# Patient Record
Sex: Female | Born: 1955 | Race: White | Hispanic: No | Marital: Married | Smoking: Never smoker
Health system: Southern US, Community
[De-identification: ages and names within clinical notes are randomized; demographics above are authoritative.]

## PROBLEM LIST (undated history)

## (undated) DIAGNOSIS — G4733 Obstructive sleep apnea (adult) (pediatric): Secondary | ICD-10-CM

## (undated) DIAGNOSIS — F411 Generalized anxiety disorder: Secondary | ICD-10-CM

## (undated) DIAGNOSIS — F319 Bipolar disorder, unspecified: Secondary | ICD-10-CM

## (undated) DIAGNOSIS — M1990 Unspecified osteoarthritis, unspecified site: Secondary | ICD-10-CM

## (undated) DIAGNOSIS — R030 Elevated blood-pressure reading, without diagnosis of hypertension: Secondary | ICD-10-CM

## (undated) DIAGNOSIS — T4145XA Adverse effect of unspecified anesthetic, initial encounter: Secondary | ICD-10-CM

## (undated) DIAGNOSIS — E041 Nontoxic single thyroid nodule: Secondary | ICD-10-CM

## (undated) DIAGNOSIS — G500 Trigeminal neuralgia: Secondary | ICD-10-CM

## (undated) DIAGNOSIS — K219 Gastro-esophageal reflux disease without esophagitis: Secondary | ICD-10-CM

## (undated) DIAGNOSIS — R220 Localized swelling, mass and lump, head: Secondary | ICD-10-CM

## (undated) DIAGNOSIS — D509 Iron deficiency anemia, unspecified: Secondary | ICD-10-CM

## (undated) DIAGNOSIS — N12 Tubulo-interstitial nephritis, not specified as acute or chronic: Secondary | ICD-10-CM

## (undated) DIAGNOSIS — E785 Hyperlipidemia, unspecified: Secondary | ICD-10-CM

## (undated) DIAGNOSIS — G43909 Migraine, unspecified, not intractable, without status migrainosus: Secondary | ICD-10-CM

## (undated) DIAGNOSIS — Z8659 Personal history of other mental and behavioral disorders: Secondary | ICD-10-CM

## (undated) DIAGNOSIS — E039 Hypothyroidism, unspecified: Secondary | ICD-10-CM

## (undated) DIAGNOSIS — F329 Major depressive disorder, single episode, unspecified: Secondary | ICD-10-CM

## (undated) DIAGNOSIS — H6520 Chronic serous otitis media, unspecified ear: Secondary | ICD-10-CM

## (undated) DIAGNOSIS — H9190 Unspecified hearing loss, unspecified ear: Secondary | ICD-10-CM

## (undated) DIAGNOSIS — G4737 Central sleep apnea in conditions classified elsewhere: Secondary | ICD-10-CM

## (undated) DIAGNOSIS — R1310 Dysphagia, unspecified: Secondary | ICD-10-CM

## (undated) DIAGNOSIS — J019 Acute sinusitis, unspecified: Secondary | ICD-10-CM

## (undated) DIAGNOSIS — I10 Essential (primary) hypertension: Secondary | ICD-10-CM

## (undated) DIAGNOSIS — H6690 Otitis media, unspecified, unspecified ear: Secondary | ICD-10-CM

## (undated) DIAGNOSIS — I872 Venous insufficiency (chronic) (peripheral): Secondary | ICD-10-CM

## (undated) DIAGNOSIS — I739 Peripheral vascular disease, unspecified: Secondary | ICD-10-CM

## (undated) DIAGNOSIS — J309 Allergic rhinitis, unspecified: Secondary | ICD-10-CM

## (undated) DIAGNOSIS — R221 Localized swelling, mass and lump, neck: Secondary | ICD-10-CM

## (undated) DIAGNOSIS — J209 Acute bronchitis, unspecified: Secondary | ICD-10-CM

## (undated) DIAGNOSIS — C439 Malignant melanoma of skin, unspecified: Secondary | ICD-10-CM

## (undated) HISTORY — DX: Venous insufficiency (chronic) (peripheral): I87.2

## (undated) HISTORY — DX: Localized swelling, mass and lump, head: R22.0

## (undated) HISTORY — DX: Generalized anxiety disorder: F41.1

## (undated) HISTORY — DX: Trigeminal neuralgia: G50.0

## (undated) HISTORY — DX: Gastro-esophageal reflux disease without esophagitis: K21.9

## (undated) HISTORY — DX: Acute bronchitis, unspecified: J20.9

## (undated) HISTORY — DX: Essential (primary) hypertension: I10

## (undated) HISTORY — PX: BRAIN SURGERY: SHX531

## (undated) HISTORY — PX: COLONOSCOPY: SHX174

## (undated) HISTORY — DX: Peripheral vascular disease, unspecified: I73.9

## (undated) HISTORY — DX: Allergic rhinitis, unspecified: J30.9

## (undated) HISTORY — DX: Dysphagia, unspecified: R13.10

## (undated) HISTORY — DX: Iron deficiency anemia, unspecified: D50.9

## (undated) HISTORY — DX: Nontoxic single thyroid nodule: E04.1

## (undated) HISTORY — DX: Localized swelling, mass and lump, neck: R22.1

## (undated) HISTORY — DX: Otitis media, unspecified, unspecified ear: H66.90

## (undated) HISTORY — DX: Unspecified hearing loss, unspecified ear: H91.90

## (undated) HISTORY — DX: Chronic serous otitis media, unspecified ear: H65.20

## (undated) HISTORY — DX: Hyperlipidemia, unspecified: E78.5

## (undated) HISTORY — DX: Elevated blood-pressure reading, without diagnosis of hypertension: R03.0

## (undated) HISTORY — DX: Central sleep apnea in conditions classified elsewhere: G47.37

## (undated) HISTORY — DX: Acute sinusitis, unspecified: J01.90

## (undated) HISTORY — DX: Major depressive disorder, single episode, unspecified: F32.9

---

## 1985-08-27 DIAGNOSIS — T8859XA Other complications of anesthesia, initial encounter: Secondary | ICD-10-CM

## 1985-08-27 HISTORY — PX: INGUINAL HERNIA REPAIR: SUR1180

## 1985-08-27 HISTORY — DX: Other complications of anesthesia, initial encounter: T88.59XA

## 1992-08-27 HISTORY — PX: BREAST BIOPSY: SHX20

## 1997-08-27 HISTORY — PX: CEREBRAL MICROVASCULAR DECOMPRESSION: SHX1328

## 1998-02-02 ENCOUNTER — Ambulatory Visit (HOSPITAL_COMMUNITY): Admission: RE | Admit: 1998-02-02 | Discharge: 1998-02-02 | Payer: Self-pay | Admitting: Internal Medicine

## 1998-08-27 HISTORY — PX: APPENDECTOMY: SHX54

## 1998-08-27 HISTORY — PX: OOPHORECTOMY: SHX86

## 1998-08-27 HISTORY — PX: ABDOMINAL HYSTERECTOMY: SHX81

## 1998-11-30 ENCOUNTER — Emergency Department (HOSPITAL_COMMUNITY): Admission: EM | Admit: 1998-11-30 | Discharge: 1998-11-30 | Payer: Self-pay | Admitting: Emergency Medicine

## 1999-09-26 ENCOUNTER — Other Ambulatory Visit: Admission: RE | Admit: 1999-09-26 | Discharge: 1999-09-26 | Payer: Self-pay | Admitting: *Deleted

## 2001-08-18 ENCOUNTER — Encounter: Admission: RE | Admit: 2001-08-18 | Discharge: 2001-11-16 | Payer: Self-pay

## 2003-03-29 ENCOUNTER — Other Ambulatory Visit: Admission: RE | Admit: 2003-03-29 | Discharge: 2003-03-29 | Payer: Self-pay | Admitting: Obstetrics and Gynecology

## 2003-04-27 ENCOUNTER — Encounter: Payer: Self-pay | Admitting: Obstetrics and Gynecology

## 2003-04-27 ENCOUNTER — Ambulatory Visit (HOSPITAL_COMMUNITY): Admission: RE | Admit: 2003-04-27 | Discharge: 2003-04-27 | Payer: Self-pay | Admitting: Obstetrics and Gynecology

## 2004-07-04 ENCOUNTER — Ambulatory Visit: Payer: Self-pay | Admitting: *Deleted

## 2004-07-12 ENCOUNTER — Ambulatory Visit: Payer: Self-pay | Admitting: *Deleted

## 2004-07-18 ENCOUNTER — Ambulatory Visit: Payer: Self-pay | Admitting: *Deleted

## 2004-07-24 ENCOUNTER — Ambulatory Visit: Payer: Self-pay | Admitting: *Deleted

## 2004-08-08 ENCOUNTER — Encounter (INDEPENDENT_AMBULATORY_CARE_PROVIDER_SITE_OTHER): Payer: Self-pay | Admitting: *Deleted

## 2004-08-08 ENCOUNTER — Ambulatory Visit (HOSPITAL_BASED_OUTPATIENT_CLINIC_OR_DEPARTMENT_OTHER): Admission: RE | Admit: 2004-08-08 | Discharge: 2004-08-08 | Payer: Self-pay | Admitting: Urology

## 2004-08-08 ENCOUNTER — Ambulatory Visit (HOSPITAL_COMMUNITY): Admission: RE | Admit: 2004-08-08 | Discharge: 2004-08-08 | Payer: Self-pay | Admitting: Urology

## 2004-08-14 ENCOUNTER — Ambulatory Visit: Payer: Self-pay | Admitting: *Deleted

## 2004-09-15 ENCOUNTER — Ambulatory Visit: Payer: Self-pay | Admitting: *Deleted

## 2004-09-20 ENCOUNTER — Ambulatory Visit: Payer: Self-pay | Admitting: *Deleted

## 2004-09-25 ENCOUNTER — Ambulatory Visit: Payer: Self-pay | Admitting: *Deleted

## 2004-10-02 ENCOUNTER — Ambulatory Visit: Payer: Self-pay | Admitting: *Deleted

## 2004-10-09 ENCOUNTER — Ambulatory Visit: Payer: Self-pay | Admitting: *Deleted

## 2004-10-23 ENCOUNTER — Ambulatory Visit: Payer: Self-pay | Admitting: *Deleted

## 2004-10-30 ENCOUNTER — Ambulatory Visit: Payer: Self-pay | Admitting: *Deleted

## 2004-11-06 ENCOUNTER — Ambulatory Visit: Payer: Self-pay | Admitting: *Deleted

## 2004-11-13 ENCOUNTER — Ambulatory Visit: Payer: Self-pay | Admitting: *Deleted

## 2004-11-20 ENCOUNTER — Ambulatory Visit: Payer: Self-pay | Admitting: *Deleted

## 2004-11-20 ENCOUNTER — Emergency Department (HOSPITAL_COMMUNITY): Admission: EM | Admit: 2004-11-20 | Discharge: 2004-11-21 | Payer: Self-pay | Admitting: Emergency Medicine

## 2004-11-20 ENCOUNTER — Ambulatory Visit: Payer: Self-pay | Admitting: Psychiatry

## 2004-11-21 ENCOUNTER — Inpatient Hospital Stay (HOSPITAL_COMMUNITY): Admission: EM | Admit: 2004-11-21 | Discharge: 2004-11-28 | Payer: Self-pay | Admitting: Psychiatry

## 2004-11-27 ENCOUNTER — Ambulatory Visit (HOSPITAL_COMMUNITY): Admission: RE | Admit: 2004-11-27 | Discharge: 2004-11-27 | Payer: Self-pay | Admitting: Psychiatry

## 2004-12-04 ENCOUNTER — Other Ambulatory Visit (HOSPITAL_COMMUNITY): Admission: RE | Admit: 2004-12-04 | Discharge: 2004-12-21 | Payer: Self-pay | Admitting: Psychiatry

## 2004-12-11 ENCOUNTER — Ambulatory Visit: Payer: Self-pay | Admitting: *Deleted

## 2005-01-01 ENCOUNTER — Ambulatory Visit: Payer: Self-pay | Admitting: *Deleted

## 2005-01-19 ENCOUNTER — Ambulatory Visit: Payer: Self-pay | Admitting: *Deleted

## 2005-03-02 ENCOUNTER — Ambulatory Visit: Payer: Self-pay | Admitting: *Deleted

## 2005-03-09 ENCOUNTER — Ambulatory Visit: Payer: Self-pay | Admitting: *Deleted

## 2005-03-12 ENCOUNTER — Ambulatory Visit: Payer: Self-pay | Admitting: *Deleted

## 2005-03-20 ENCOUNTER — Ambulatory Visit: Payer: Self-pay | Admitting: *Deleted

## 2005-04-02 ENCOUNTER — Ambulatory Visit: Payer: Self-pay | Admitting: *Deleted

## 2005-04-13 ENCOUNTER — Ambulatory Visit: Payer: Self-pay | Admitting: *Deleted

## 2005-04-23 ENCOUNTER — Ambulatory Visit: Payer: Self-pay | Admitting: *Deleted

## 2005-05-18 ENCOUNTER — Ambulatory Visit: Payer: Self-pay | Admitting: Internal Medicine

## 2005-05-30 ENCOUNTER — Ambulatory Visit (HOSPITAL_COMMUNITY): Admission: RE | Admit: 2005-05-30 | Discharge: 2005-05-30 | Payer: Self-pay | Admitting: Internal Medicine

## 2005-05-30 ENCOUNTER — Ambulatory Visit: Payer: Self-pay | Admitting: Internal Medicine

## 2005-06-01 ENCOUNTER — Ambulatory Visit (HOSPITAL_COMMUNITY): Admission: RE | Admit: 2005-06-01 | Discharge: 2005-06-01 | Payer: Self-pay | Admitting: Internal Medicine

## 2005-06-13 ENCOUNTER — Ambulatory Visit: Payer: Self-pay | Admitting: Endocrinology

## 2005-06-13 ENCOUNTER — Ambulatory Visit: Payer: Self-pay | Admitting: Internal Medicine

## 2005-06-14 ENCOUNTER — Ambulatory Visit: Payer: Self-pay | Admitting: Gastroenterology

## 2005-06-14 ENCOUNTER — Ambulatory Visit (HOSPITAL_COMMUNITY): Admission: RE | Admit: 2005-06-14 | Discharge: 2005-06-14 | Payer: Self-pay | Admitting: Gastroenterology

## 2005-06-14 ENCOUNTER — Encounter (INDEPENDENT_AMBULATORY_CARE_PROVIDER_SITE_OTHER): Payer: Self-pay | Admitting: *Deleted

## 2005-06-29 ENCOUNTER — Ambulatory Visit: Payer: Self-pay | Admitting: Gastroenterology

## 2005-07-12 ENCOUNTER — Encounter (INDEPENDENT_AMBULATORY_CARE_PROVIDER_SITE_OTHER): Payer: Self-pay | Admitting: *Deleted

## 2005-07-12 ENCOUNTER — Encounter: Admission: RE | Admit: 2005-07-12 | Discharge: 2005-07-12 | Payer: Self-pay | Admitting: Endocrinology

## 2005-07-12 ENCOUNTER — Other Ambulatory Visit: Admission: RE | Admit: 2005-07-12 | Discharge: 2005-07-12 | Payer: Self-pay | Admitting: Interventional Radiology

## 2005-09-07 ENCOUNTER — Ambulatory Visit: Payer: Self-pay | Admitting: *Deleted

## 2005-09-19 ENCOUNTER — Ambulatory Visit: Payer: Self-pay | Admitting: *Deleted

## 2005-10-12 ENCOUNTER — Ambulatory Visit: Payer: Self-pay | Admitting: *Deleted

## 2005-10-19 ENCOUNTER — Ambulatory Visit: Payer: Self-pay | Admitting: *Deleted

## 2005-10-26 ENCOUNTER — Ambulatory Visit: Payer: Self-pay | Admitting: *Deleted

## 2005-11-02 ENCOUNTER — Ambulatory Visit: Payer: Self-pay | Admitting: *Deleted

## 2005-11-09 ENCOUNTER — Ambulatory Visit: Payer: Self-pay | Admitting: *Deleted

## 2005-11-26 ENCOUNTER — Ambulatory Visit: Payer: Self-pay | Admitting: *Deleted

## 2005-11-29 ENCOUNTER — Ambulatory Visit: Payer: Self-pay | Admitting: Internal Medicine

## 2005-11-29 ENCOUNTER — Ambulatory Visit: Payer: Self-pay | Admitting: *Deleted

## 2005-12-07 ENCOUNTER — Ambulatory Visit: Payer: Self-pay | Admitting: *Deleted

## 2005-12-07 ENCOUNTER — Ambulatory Visit: Payer: Self-pay | Admitting: Internal Medicine

## 2005-12-14 ENCOUNTER — Ambulatory Visit: Payer: Self-pay | Admitting: *Deleted

## 2005-12-18 ENCOUNTER — Ambulatory Visit: Payer: Self-pay | Admitting: *Deleted

## 2005-12-20 ENCOUNTER — Ambulatory Visit: Payer: Self-pay | Admitting: Internal Medicine

## 2005-12-21 ENCOUNTER — Ambulatory Visit: Payer: Self-pay | Admitting: *Deleted

## 2005-12-28 ENCOUNTER — Ambulatory Visit: Payer: Self-pay | Admitting: *Deleted

## 2005-12-28 ENCOUNTER — Ambulatory Visit: Payer: Self-pay | Admitting: Internal Medicine

## 2006-01-04 ENCOUNTER — Ambulatory Visit: Payer: Self-pay | Admitting: *Deleted

## 2006-01-14 ENCOUNTER — Ambulatory Visit: Payer: Self-pay | Admitting: *Deleted

## 2006-02-01 ENCOUNTER — Ambulatory Visit: Payer: Self-pay | Admitting: *Deleted

## 2006-02-14 ENCOUNTER — Ambulatory Visit: Payer: Self-pay | Admitting: Internal Medicine

## 2006-02-15 ENCOUNTER — Ambulatory Visit: Payer: Self-pay | Admitting: *Deleted

## 2006-07-05 ENCOUNTER — Ambulatory Visit: Payer: Self-pay | Admitting: *Deleted

## 2006-09-27 ENCOUNTER — Ambulatory Visit: Payer: Self-pay | Admitting: Internal Medicine

## 2006-12-31 ENCOUNTER — Ambulatory Visit (HOSPITAL_BASED_OUTPATIENT_CLINIC_OR_DEPARTMENT_OTHER): Admission: RE | Admit: 2006-12-31 | Discharge: 2006-12-31 | Payer: Self-pay | Admitting: Urology

## 2007-01-07 ENCOUNTER — Ambulatory Visit: Payer: Self-pay | Admitting: Internal Medicine

## 2007-08-30 ENCOUNTER — Encounter: Payer: Self-pay | Admitting: Internal Medicine

## 2008-01-20 ENCOUNTER — Ambulatory Visit: Payer: Self-pay | Admitting: Internal Medicine

## 2008-01-20 DIAGNOSIS — E785 Hyperlipidemia, unspecified: Secondary | ICD-10-CM

## 2008-01-20 DIAGNOSIS — J309 Allergic rhinitis, unspecified: Secondary | ICD-10-CM | POA: Insufficient documentation

## 2008-01-20 DIAGNOSIS — F411 Generalized anxiety disorder: Secondary | ICD-10-CM

## 2008-01-20 DIAGNOSIS — D509 Iron deficiency anemia, unspecified: Secondary | ICD-10-CM

## 2008-01-20 DIAGNOSIS — G500 Trigeminal neuralgia: Secondary | ICD-10-CM

## 2008-01-20 DIAGNOSIS — F3289 Other specified depressive episodes: Secondary | ICD-10-CM

## 2008-01-20 DIAGNOSIS — F329 Major depressive disorder, single episode, unspecified: Secondary | ICD-10-CM

## 2008-01-20 DIAGNOSIS — F419 Anxiety disorder, unspecified: Secondary | ICD-10-CM | POA: Insufficient documentation

## 2008-01-20 HISTORY — DX: Major depressive disorder, single episode, unspecified: F32.9

## 2008-01-20 HISTORY — DX: Trigeminal neuralgia: G50.0

## 2008-01-20 HISTORY — DX: Other specified depressive episodes: F32.89

## 2008-01-20 HISTORY — DX: Iron deficiency anemia, unspecified: D50.9

## 2008-01-20 HISTORY — DX: Allergic rhinitis, unspecified: J30.9

## 2008-01-20 HISTORY — DX: Generalized anxiety disorder: F41.1

## 2008-01-20 HISTORY — DX: Hyperlipidemia, unspecified: E78.5

## 2008-03-05 ENCOUNTER — Encounter: Payer: Self-pay | Admitting: Internal Medicine

## 2008-04-13 ENCOUNTER — Encounter: Payer: Self-pay | Admitting: Internal Medicine

## 2008-05-05 ENCOUNTER — Ambulatory Visit: Payer: Self-pay | Admitting: Internal Medicine

## 2008-05-19 ENCOUNTER — Ambulatory Visit: Payer: Self-pay | Admitting: Internal Medicine

## 2008-05-19 DIAGNOSIS — J209 Acute bronchitis, unspecified: Secondary | ICD-10-CM | POA: Insufficient documentation

## 2008-05-19 HISTORY — DX: Acute bronchitis, unspecified: J20.9

## 2008-05-26 ENCOUNTER — Encounter: Payer: Self-pay | Admitting: Internal Medicine

## 2008-06-28 ENCOUNTER — Ambulatory Visit: Payer: Self-pay | Admitting: Internal Medicine

## 2008-09-09 ENCOUNTER — Telehealth (INDEPENDENT_AMBULATORY_CARE_PROVIDER_SITE_OTHER): Payer: Self-pay | Admitting: *Deleted

## 2008-09-16 ENCOUNTER — Telehealth (INDEPENDENT_AMBULATORY_CARE_PROVIDER_SITE_OTHER): Payer: Self-pay | Admitting: *Deleted

## 2008-09-21 ENCOUNTER — Ambulatory Visit: Payer: Self-pay | Admitting: Internal Medicine

## 2008-09-21 DIAGNOSIS — R220 Localized swelling, mass and lump, head: Secondary | ICD-10-CM

## 2008-09-21 DIAGNOSIS — J019 Acute sinusitis, unspecified: Secondary | ICD-10-CM

## 2008-09-21 DIAGNOSIS — H9190 Unspecified hearing loss, unspecified ear: Secondary | ICD-10-CM

## 2008-09-21 DIAGNOSIS — R221 Localized swelling, mass and lump, neck: Secondary | ICD-10-CM

## 2008-09-21 HISTORY — DX: Localized swelling, mass and lump, head: R22.0

## 2008-09-21 HISTORY — DX: Unspecified hearing loss, unspecified ear: H91.90

## 2008-09-21 HISTORY — DX: Acute sinusitis, unspecified: J01.90

## 2008-09-24 ENCOUNTER — Telehealth (INDEPENDENT_AMBULATORY_CARE_PROVIDER_SITE_OTHER): Payer: Self-pay | Admitting: *Deleted

## 2008-09-24 ENCOUNTER — Ambulatory Visit: Payer: Self-pay | Admitting: Internal Medicine

## 2008-09-24 LAB — CONVERTED CEMR LAB
ALT: 25 units/L (ref 0–35)
AST: 39 units/L — ABNORMAL HIGH (ref 0–37)
Albumin: 3.7 g/dL (ref 3.5–5.2)
Alkaline Phosphatase: 69 units/L (ref 39–117)
BUN: 12 mg/dL (ref 6–23)
Basophils Absolute: 0.1 10*3/uL (ref 0.0–0.1)
Basophils Relative: 1 % (ref 0.0–3.0)
Bilirubin Urine: NEGATIVE
Bilirubin, Direct: 0.1 mg/dL (ref 0.0–0.3)
CO2: 30 meq/L (ref 19–32)
Calcium: 9.1 mg/dL (ref 8.4–10.5)
Chloride: 106 meq/L (ref 96–112)
Cholesterol: 155 mg/dL (ref 0–200)
Creatinine, Ser: 0.7 mg/dL (ref 0.4–1.2)
Crystals: NEGATIVE
Eosinophils Absolute: 0.3 10*3/uL (ref 0.0–0.7)
Eosinophils Relative: 5.4 % — ABNORMAL HIGH (ref 0.0–5.0)
GFR calc Af Amer: 114 mL/min
GFR calc non Af Amer: 94 mL/min
Glucose, Bld: 92 mg/dL (ref 70–99)
HCT: 35.8 % — ABNORMAL LOW (ref 36.0–46.0)
HDL: 52.5 mg/dL (ref >39.0)
Hemoglobin, Urine: NEGATIVE
Hemoglobin: 12.1 g/dL (ref 12.0–15.0)
Ketones, ur: NEGATIVE mg/dL
LDL Cholesterol: 85 mg/dL (ref 0–99)
Lymphocytes Relative: 33.5 % (ref 12.0–46.0)
MCHC: 33.8 g/dL (ref 30.0–36.0)
MCV: 87.7 fL (ref 78.0–100.0)
Monocytes Absolute: 0.5 10*3/uL (ref 0.1–1.0)
Monocytes Relative: 8.3 % (ref 3.0–12.0)
Mucus, UA: NEGATIVE
Neutro Abs: 3.2 10*3/uL (ref 1.4–7.7)
Neutrophils Relative %: 51.8 % (ref 43.0–77.0)
Nitrite: NEGATIVE
Platelets: 241 10*3/uL (ref 150–400)
Potassium: 4.7 meq/L (ref 3.5–5.1)
RBC: 4.09 M/uL (ref 3.87–5.11)
RDW: 11.8 % (ref 11.5–14.6)
Sodium: 140 meq/L (ref 135–145)
Specific Gravity, Urine: 1.015 (ref 1.000–1.03)
TSH: 1.41 microintl units/mL (ref 0.35–5.50)
Total Bilirubin: 0.6 mg/dL (ref 0.3–1.2)
Total CHOL/HDL Ratio: 3
Total Protein: 6.5 g/dL (ref 6.0–8.3)
Triglycerides: 88 mg/dL (ref 0–149)
Urine Glucose: NEGATIVE mg/dL
Urobilinogen, UA: 0.2 (ref 0.0–1.0)
VLDL: 18 mg/dL (ref 0–40)
WBC: 6.1 10*3/uL (ref 4.5–10.5)
pH: 7 (ref 5.0–8.0)

## 2008-10-11 ENCOUNTER — Ambulatory Visit: Payer: Self-pay | Admitting: Internal Medicine

## 2008-10-22 ENCOUNTER — Encounter: Admission: RE | Admit: 2008-10-22 | Discharge: 2008-10-22 | Payer: Self-pay | Admitting: Internal Medicine

## 2008-10-22 LAB — HM MAMMOGRAPHY: HM Mammogram: NEGATIVE

## 2008-12-06 ENCOUNTER — Encounter (INDEPENDENT_AMBULATORY_CARE_PROVIDER_SITE_OTHER): Payer: Self-pay | Admitting: *Deleted

## 2008-12-06 ENCOUNTER — Ambulatory Visit: Payer: Self-pay | Admitting: Internal Medicine

## 2008-12-06 ENCOUNTER — Telehealth: Payer: Self-pay | Admitting: Internal Medicine

## 2008-12-06 DIAGNOSIS — R1310 Dysphagia, unspecified: Secondary | ICD-10-CM

## 2008-12-06 DIAGNOSIS — K219 Gastro-esophageal reflux disease without esophagitis: Secondary | ICD-10-CM

## 2008-12-06 DIAGNOSIS — R030 Elevated blood-pressure reading, without diagnosis of hypertension: Secondary | ICD-10-CM

## 2008-12-06 HISTORY — DX: Dysphagia, unspecified: R13.10

## 2008-12-06 HISTORY — DX: Elevated blood-pressure reading, without diagnosis of hypertension: R03.0

## 2008-12-07 ENCOUNTER — Telehealth: Payer: Self-pay | Admitting: Internal Medicine

## 2008-12-07 ENCOUNTER — Telehealth (INDEPENDENT_AMBULATORY_CARE_PROVIDER_SITE_OTHER): Payer: Self-pay | Admitting: *Deleted

## 2008-12-16 ENCOUNTER — Ambulatory Visit: Payer: Self-pay | Admitting: Internal Medicine

## 2008-12-16 DIAGNOSIS — H6690 Otitis media, unspecified, unspecified ear: Secondary | ICD-10-CM

## 2008-12-16 DIAGNOSIS — G459 Transient cerebral ischemic attack, unspecified: Secondary | ICD-10-CM | POA: Insufficient documentation

## 2008-12-16 DIAGNOSIS — I10 Essential (primary) hypertension: Secondary | ICD-10-CM | POA: Insufficient documentation

## 2008-12-16 HISTORY — DX: Otitis media, unspecified, unspecified ear: H66.90

## 2008-12-16 HISTORY — DX: Essential (primary) hypertension: I10

## 2008-12-29 ENCOUNTER — Ambulatory Visit: Payer: Self-pay | Admitting: Cardiovascular Disease

## 2008-12-29 ENCOUNTER — Telehealth (INDEPENDENT_AMBULATORY_CARE_PROVIDER_SITE_OTHER): Payer: Self-pay | Admitting: *Deleted

## 2008-12-29 ENCOUNTER — Ambulatory Visit: Payer: Self-pay | Admitting: Internal Medicine

## 2008-12-29 ENCOUNTER — Encounter: Admission: RE | Admit: 2008-12-29 | Discharge: 2008-12-29 | Payer: Self-pay | Admitting: Internal Medicine

## 2008-12-29 DIAGNOSIS — R1319 Other dysphagia: Secondary | ICD-10-CM | POA: Insufficient documentation

## 2008-12-30 ENCOUNTER — Ambulatory Visit: Payer: Self-pay | Admitting: Internal Medicine

## 2008-12-30 DIAGNOSIS — H6520 Chronic serous otitis media, unspecified ear: Secondary | ICD-10-CM | POA: Insufficient documentation

## 2008-12-30 DIAGNOSIS — H6500 Acute serous otitis media, unspecified ear: Secondary | ICD-10-CM | POA: Insufficient documentation

## 2008-12-30 HISTORY — DX: Chronic serous otitis media, unspecified ear: H65.20

## 2009-01-04 ENCOUNTER — Encounter: Payer: Self-pay | Admitting: Internal Medicine

## 2009-01-04 ENCOUNTER — Ambulatory Visit: Payer: Self-pay

## 2009-01-07 ENCOUNTER — Encounter: Payer: Self-pay | Admitting: Internal Medicine

## 2009-01-08 ENCOUNTER — Encounter: Admission: RE | Admit: 2009-01-08 | Discharge: 2009-01-08 | Payer: Self-pay | Admitting: Otolaryngology

## 2009-01-11 ENCOUNTER — Encounter: Payer: Self-pay | Admitting: Internal Medicine

## 2009-01-28 ENCOUNTER — Encounter: Payer: Self-pay | Admitting: Internal Medicine

## 2009-01-31 ENCOUNTER — Encounter: Payer: Self-pay | Admitting: Internal Medicine

## 2009-02-09 ENCOUNTER — Encounter: Payer: Self-pay | Admitting: Internal Medicine

## 2009-02-24 ENCOUNTER — Encounter: Payer: Self-pay | Admitting: Internal Medicine

## 2009-04-07 ENCOUNTER — Encounter: Payer: Self-pay | Admitting: Internal Medicine

## 2009-08-18 ENCOUNTER — Encounter: Payer: Self-pay | Admitting: Internal Medicine

## 2009-09-19 ENCOUNTER — Encounter: Payer: Self-pay | Admitting: Internal Medicine

## 2009-10-21 ENCOUNTER — Ambulatory Visit: Payer: Self-pay | Admitting: Internal Medicine

## 2009-10-21 LAB — CONVERTED CEMR LAB
ALT: 11 units/L (ref 0–35)
AST: 16 units/L (ref 0–37)
Albumin: 4.4 g/dL (ref 3.5–5.2)
Alkaline Phosphatase: 71 units/L (ref 39–117)
BUN: 14 mg/dL (ref 6–23)
Basophils Absolute: 0 10*3/uL (ref 0.0–0.1)
Basophils Relative: 0.8 % (ref 0.0–3.0)
Bilirubin Urine: NEGATIVE
Bilirubin, Direct: 0.2 mg/dL (ref 0.0–0.3)
CO2: 31 meq/L (ref 19–32)
Calcium: 9.6 mg/dL (ref 8.4–10.5)
Chloride: 105 meq/L (ref 96–112)
Cholesterol: 169 mg/dL (ref 0–200)
Creatinine, Ser: 0.7 mg/dL (ref 0.4–1.2)
Eosinophils Absolute: 0.3 10*3/uL (ref 0.0–0.7)
Eosinophils Relative: 7 % — ABNORMAL HIGH (ref 0.0–5.0)
GFR calc non Af Amer: 93.48 mL/min (ref >60)
Glucose, Bld: 98 mg/dL (ref 70–99)
HCT: 41 % (ref 36.0–46.0)
HDL: 64.4 mg/dL (ref >39.00)
Hemoglobin, Urine: NEGATIVE
Hemoglobin: 13.3 g/dL (ref 12.0–15.0)
Ketones, ur: NEGATIVE mg/dL
LDL Cholesterol: 92 mg/dL (ref 0–99)
Lymphocytes Relative: 32.5 % (ref 12.0–46.0)
Lymphs Abs: 1.5 10*3/uL (ref 0.7–4.0)
MCHC: 32.4 g/dL (ref 30.0–36.0)
MCV: 87.3 fL (ref 78.0–100.0)
Monocytes Absolute: 0.5 10*3/uL (ref 0.1–1.0)
Monocytes Relative: 9.9 % (ref 3.0–12.0)
Neutro Abs: 2.3 10*3/uL (ref 1.4–7.7)
Neutrophils Relative %: 49.8 % (ref 43.0–77.0)
Nitrite: NEGATIVE
Platelets: 238 10*3/uL (ref 150.0–400.0)
Potassium: 4.7 meq/L (ref 3.5–5.1)
RBC: 4.7 M/uL (ref 3.87–5.11)
RDW: 11.6 % (ref 11.5–14.6)
Sodium: 140 meq/L (ref 135–145)
Specific Gravity, Urine: 1.025 (ref 1.000–1.030)
TSH: 1.06 microintl units/mL (ref 0.35–5.50)
Total Bilirubin: 1.2 mg/dL (ref 0.3–1.2)
Total CHOL/HDL Ratio: 3
Total Protein, Urine: NEGATIVE mg/dL
Total Protein: 7.8 g/dL (ref 6.0–8.3)
Triglycerides: 62 mg/dL (ref 0.0–149.0)
Urine Glucose: NEGATIVE mg/dL
Urobilinogen, UA: 0.2 (ref 0.0–1.0)
VLDL: 12.4 mg/dL (ref 0.0–40.0)
WBC: 4.6 10*3/uL (ref 4.5–10.5)
pH: 6 (ref 5.0–8.0)

## 2009-10-25 ENCOUNTER — Ambulatory Visit: Payer: Self-pay | Admitting: Internal Medicine

## 2009-10-25 ENCOUNTER — Telehealth: Payer: Self-pay | Admitting: Internal Medicine

## 2009-10-25 DIAGNOSIS — R232 Flushing: Secondary | ICD-10-CM

## 2009-10-25 DIAGNOSIS — G4731 Primary central sleep apnea: Secondary | ICD-10-CM | POA: Insufficient documentation

## 2009-10-26 ENCOUNTER — Encounter (INDEPENDENT_AMBULATORY_CARE_PROVIDER_SITE_OTHER): Payer: Self-pay | Admitting: *Deleted

## 2009-11-08 ENCOUNTER — Encounter: Payer: Self-pay | Admitting: Internal Medicine

## 2009-11-15 ENCOUNTER — Encounter (INDEPENDENT_AMBULATORY_CARE_PROVIDER_SITE_OTHER): Payer: Self-pay | Admitting: *Deleted

## 2009-11-16 ENCOUNTER — Ambulatory Visit: Payer: Self-pay | Admitting: Internal Medicine

## 2010-01-16 ENCOUNTER — Ambulatory Visit: Payer: Self-pay | Admitting: Internal Medicine

## 2010-01-16 ENCOUNTER — Telehealth: Payer: Self-pay | Admitting: Internal Medicine

## 2010-01-16 LAB — HM COLONOSCOPY

## 2010-05-03 ENCOUNTER — Ambulatory Visit: Payer: Self-pay | Admitting: Endocrinology

## 2010-05-03 DIAGNOSIS — I872 Venous insufficiency (chronic) (peripheral): Secondary | ICD-10-CM | POA: Insufficient documentation

## 2010-05-03 HISTORY — DX: Venous insufficiency (chronic) (peripheral): I87.2

## 2010-05-12 ENCOUNTER — Ambulatory Visit: Payer: Self-pay | Admitting: Cardiovascular Disease

## 2010-05-12 DIAGNOSIS — I739 Peripheral vascular disease, unspecified: Secondary | ICD-10-CM

## 2010-05-12 HISTORY — DX: Peripheral vascular disease, unspecified: I73.9

## 2010-06-06 ENCOUNTER — Encounter: Payer: Self-pay | Admitting: Internal Medicine

## 2010-06-06 ENCOUNTER — Ambulatory Visit: Payer: Self-pay | Admitting: Vascular Surgery

## 2010-08-27 DIAGNOSIS — C439 Malignant melanoma of skin, unspecified: Secondary | ICD-10-CM

## 2010-08-27 HISTORY — DX: Malignant melanoma of skin, unspecified: C43.9

## 2010-08-30 ENCOUNTER — Ambulatory Visit: Admit: 2010-08-30 | Payer: Self-pay | Admitting: Internal Medicine

## 2010-08-31 ENCOUNTER — Ambulatory Visit
Admission: RE | Admit: 2010-08-31 | Discharge: 2010-08-31 | Payer: Self-pay | Source: Home / Self Care | Attending: Internal Medicine | Admitting: Internal Medicine

## 2010-08-31 DIAGNOSIS — G4737 Central sleep apnea in conditions classified elsewhere: Secondary | ICD-10-CM | POA: Insufficient documentation

## 2010-08-31 HISTORY — DX: Central sleep apnea in conditions classified elsewhere: G47.37

## 2010-09-16 ENCOUNTER — Encounter: Payer: Self-pay | Admitting: Gastroenterology

## 2010-09-26 NOTE — Progress Notes (Signed)
----   Converted from flag ---- ---- 10/25/2009 11:23 AM, Corwin Levins MD wrote: she is correct  - done hardcopy to robin ---- 10/25/2009 9:32 AM, Zella Ball Ewing wrote: patient requested to have the special urine test done you discussed with her at her visit today. She can come by anytime to do it just let me know and I will notify her. ------------------------------  called pt left msg to call back called pt informed to pickup urine test and take to lab and have done.

## 2010-09-26 NOTE — Letter (Signed)
Summary: Previsit letter  [facility]  [address] [place], [place] [address]   Phone: [phone]  Fax: [phone]       10/26/2009 MRN: [id number]  [facility] [address] [place], [place]  [address]  Dear Ms. Sheryl Lester,  Welcome to the Gastroenterology Division at [facility].    You are scheduled to see a nurse for your pre-procedure visit on 11-16-09 at 1:00p.m. on the 3rd floor at [facility], [address]  We ask that you try to arrive at our office 15 minutes prior to your appointment time to allow for check-in.  Your nurse visit will consist of discussing your medical and surgical history, your immediate family medical history, and your medications.    Please bring a complete list of all your medications or, if you prefer, bring the medication bottles and we will list them.  We will need to be aware of both prescribed and over the counter drugs.  We will need to know exact dosage information as well.  If you are on blood thinners (Coumadin, Plavix, Aggrenox, Ticlid, etc.) please call our office today/prior to your appointment, as we need to consult with your physician about holding your medication.   Please be prepared to read and sign documents such as consent forms, a financial agreement, and acknowledgement forms.  If necessary, and with your consent, a friend or relative is welcome to sit-in on the nurse visit with you.  Please bring your insurance card so that we may make a copy of it.  If your insurance requires a referral to see a specialist, please bring your referral form from your primary care physician.  No co-pay is required for this nurse visit.     If you cannot keep your appointment, please call [phone] to cancel or reschedule prior to your appointment date.  This allows [place] the opportunity to schedule an appointment for another patient in need of care.    Thank you for choosing [facility] for your medical  needs.  We appreciate the opportunity to care for you.  Please visit [place] at our website  to learn more about our practice.                     Sincerely.                                                                                                                   The Gastroenterology Division

## 2010-09-26 NOTE — Letter (Signed)
Summary: [facility] Instructions  [facility]  [address] [place], [place] [address]   Phone: [phone]  Fax: [phone]       Sheryl Lester    [birth year]    MRN: [id number]        Procedure Day Dorna Bloom:  [facility]  12/05/09     Arrival Time:  2:30AM     Procedure Time:  3:30AM     Location of Procedure:                    _X _  [facility] (4th Floor)                        PREPARATION FOR COLONOSCOPY WITH MOVIPREP   Starting 5 days prior to your procedure 11/30/09 do not eat nuts, seeds, popcorn, corn, beans, peas,  salads, or any raw vegetables.  Do not take any fiber supplements (e.g. Metamucil, Citrucel, and Benefiber).  THE DAY BEFORE YOUR PROCEDURE         DATE: 12/04/09  DAY: SUNDAY  1.  Drink clear liquids the entire day-NO SOLID FOOD  2.  Do not drink anything colored red or purple.  Avoid juices with pulp.  No orange juice.  3.  Drink at least 64 oz. (8 glasses) of fluid/clear liquids during the day to prevent dehydration and help the prep work efficiently.  CLEAR LIQUIDS INCLUDE: Water Jello Ice Popsicles Tea (sugar ok, no milk/cream) Powdered fruit flavored drinks Coffee (sugar ok, no milk/cream) Gatorade Juice: apple, white grape, white cranberry  Lemonade Clear bullion, consomm, broth Carbonated beverages (any kind) Strained chicken noodle soup Hard Candy                             4.  In the morning, mix first dose of MoviPrep solution:    Empty 1 Pouch A and 1 Pouch B into the disposable container    Add lukewarm drinking water to the top line of the container. Mix to dissolve    Refrigerate (mixed solution should be used within 24 hrs)  5.  Begin drinking the prep at 5:00 p.m. The MoviPrep container is divided by 4 marks.   Every 15 minutes drink the solution down to the next mark (approximately 8 oz) until the full liter is complete.   6.  Follow completed prep with 16 oz of clear liquid of your choice  (Nothing red or purple).  Continue to drink clear liquids until bedtime.  7.  Before going to bed, mix second dose of MoviPrep solution:    Empty 1 Pouch A and 1 Pouch B into the disposable container    Add lukewarm drinking water to the top line of the container. Mix to dissolve    Refrigerate  THE DAY OF YOUR PROCEDURE      DATE: 12/05/09  DAY: MONDAY  Beginning at 10:30a.m. (5 hours before procedure):         1. Every 15 minutes, drink the solution down to the next mark (approx 8 oz) until the full liter is complete.  2. Follow completed prep with 16 oz. of clear liquid of your choice.    3. You may drink clear liquids until 1:30PM (2 HOURS BEFORE PROCEDURE).   MEDICATION INSTRUCTIONS  Unless otherwise instructed, you should take regular prescription medications with a small sip of water   as early as possible the morning  of your procedure.   Additional medication instructions: _         OTHER INSTRUCTIONS  You will need a responsible adult at least 53 years of age to accompany you and drive you home.   This person must remain in the waiting room during your procedure.  Wear loose fitting clothing that is easily removed.  Leave jewelry and other valuables at home.  However, you may wish to bring a book to read or  an iPod/MP3 player to listen to music as you wait for your procedure to start.  Remove all body piercing jewelry and leave at home.  Total time from sign-in until discharge is approximately 2-3 hours.  You should go home directly after your procedure and rest.  You can resume normal activities the  day after your procedure.  The day of your procedure you should not:   Drive   Make legal decisions   Operate machinery   Drink alcohol   Return to work  You will receive specific instructions about eating, activities and medications before you leave.    The above instructions have been reviewed and explained to me by Wyona Almas RN  November 16, 2009 1:37 PM     I fully understand and can verbalize these instructions _____________________________ Date _________

## 2010-09-26 NOTE — Assessment & Plan Note (Signed)
Summary: CPX/MEDCOST PREFERRED/#/CD   Vital Signs:  Patient profile:   55 year old female Height:      66 inches Weight:      145 pounds BMI:     23.49 O2 Sat:      98 % on Room air Temp:     97.2 degrees F oral Pulse rate:   109 / minute BP sitting:   150 / 90  (left arm) Cuff size:   regular  Vitals Entered ByZella Ball Ewing (October 25, 2009 8:41 AM)  O2 Flow:  Room air  CC: Adult Physical/RE   Primary Care Provider:  Oliver Barre, MD  CC:  Adult Physical/RE.  History of Present Illness: since seen last, has been been found to central apnea after sleep walking - ? due to the fenatanyl ,but cant stop the fentanyl due to the pain;  using Bipap at night ;  to see NS for furhter eval and tx;  never did the gamaa knife recently altough repeat tx she has had in the past was entertained;  BP at home usually < 140/90, Pt denies CP, sob, doe, wheezing, orthopnea, pnd, worsening LE edema, palps, dizziness or syncope   Pt denies new neuro symptoms such as headache, facial or extremity weakness   Problems Prior to Update: 1)  Flushing  (ICD-782.62) 2)  Primary Central Sleep Apnea  (ICD-327.21) 3)  Thyroid Nodule, Left  (ICD-241.0) 4)  Otitis Media, Serous, Chronic  (ICD-381.10) 5)  Otitis Media, Serous, Acute  (ICD-381.01) 6)  Screening Colorectal-cancer  (ICD-V76.51) 7)  Dysphagia  (ICD-787.29) 8)  Tia  (ICD-435.9) 9)  Hypertension  (ICD-401.9) 10)  Otitis Media, Acute, Bilateral  (ICD-382.9) 11)  Elevated Blood Pressure Without Diagnosis of Hypertension  (ICD-796.2) 12)  Gerd  (ICD-530.81) 13)  Dysphagia Unspecified  (ICD-787.20) 14)  Preventive Health Care  (ICD-V70.0) 15)  Unspecified Hearing Loss  (ICD-389.9) 16)  Sinusitis- Acute-nos  (ICD-461.9) 17)  Swelling Mass or Lump in Head and Neck  (ICD-784.2) 18)  Asthmatic Bronchitis, Acute  (ICD-466.0) 19)  Trigeminal Neuralgia  (ICD-350.1) 20)  Allergic Rhinitis  (ICD-477.9) 21)  Hyperlipidemia  (ICD-272.4) 22)  Depression   (ICD-311) 23)  Anxiety  (ICD-300.00) 24)  Anemia-iron Deficiency  (ICD-280.9)  Medications Prior to Update: 1)  Fentanyl 100 Mcg/hr Pt72 (Fentanyl) .Marland Kitchen.. 1 Patch Every 3 Days 2)  Vivactil 10 Mg Tabs (Protriptyline Hcl) .... 2 By Mouth in The Am 3)  Claritin 10 Mg Tabs (Loratadine) .... Otc - Use 1 By Mouth Once Daily 4)  Aciphex 20 Mg Tbec (Rabeprazole Sodium) .Marland Kitchen.. 1 By Mouth Once Daily As Needed 5)  Kenalog in Orabase .... Use Asd Four Times Per Day To Affected Area  Current Medications (verified): 1)  Fentanyl 75 Mcg/hr Pt72 (Fentanyl) .Marland Kitchen.. 1 Patch Every Other Day 2)  Vivactil 10 Mg Tabs (Protriptyline Hcl) .... 4 By Mouth Qam  Allergies (verified): 1)  ! 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  Risk Factors: Exercise: yes (12/29/2008)  Risk Factors: Smoking Status: never (01/20/2008)  Past Medical History: esophageal cyst trigeminal neuralgia s/p microdecompression - [facility] chronic pain syndrome Anemia-iron deficiency Anxiety Depression hx of interstitial cystitis Hyperlipidemia benign thyroid   cyst/goiter Allergic rhinitis fibrocystic breast disease hx of pyelonephritis  1989 surgical menopause at 55yo GERD Hypertension central sleep apnea  Family History: Reviewed history from 12/29/2008 and no changes required. mother with colon polyp, elevated cholesterol, HTN breast cancer:  Emelda Brothers father with DM No FH of Colon Cancer: Family History of Heart Disease: Mother side of family sister with melanoma  Review of Systems  The patient denies anorexia, fever, vision loss, decreased hearing, hoarseness, chest pain, syncope, dyspnea on exertion, peripheral edema, prolonged cough, headaches, hemoptysis, abdominal pain, melena, hematochezia, severe indigestion/heartburn, hematuria, incontinence, muscle weakness, suspicious skin lesions, transient blindness, difficulty walking, unusual weight change, abnormal bleeding, enlarged lymph nodes, and angioedema.         all otherwise negative per pt except for episodes of flushing  Physical Exam  General:  alert and overweight-appearing.   Head:  normocephalic and atraumatic.   Eyes:  vision grossly intact, pupils equal, and pupils round.   Ears:  R ear normal and L ear normal.   Nose:  no external deformity and no nasal discharge.   Mouth:  no gingival abnormalities and pharynx pink and moist.   Neck:  supple and no masses.   Lungs:  normal respiratory effort and normal breath sounds.   Heart:  normal rate and regular rhythm.   Abdomen:  soft, non-tender, and normal bowel sounds.   Msk:  no joint tenderness and no joint swelling.   Extremities:  no edema, no erythema  Neurologic:  cranial nerves II-XII intact and strength normal in all extremities.     Impression & Recommendations:  Problem # 1:  Preventive Health Care (ICD-V70.0)  Overall doing well, age appropriate education and counseling updated and referral for appropriate preventive services done unless declined, immunizations up to date or declined, diet counseling done if overweight, urged to quit smoking if smokes , most recent labs reviewed and  current ordered if appropriate, ecg reviewed or declined (interpretation per ECG scanned in the EMR if done); information regarding Medicare Prevention requirements given if appropriate   Orders: EKG w/ Interpretation (93000) Gastroenterology Referral (GI)  Problem # 2:  HYPERTENSION (ICD-401.9)  stable overall by hx and exam, ok to continue meds/tx as is - declines meds as BP at home < 140/90 regularly   BP today: 150/90 Prior BP: 142/86 (12/30/2008)  Labs Reviewed: K+: 4.7 (10/21/2009) Creat: : 0.7 (10/21/2009)   Chol: 169 (10/21/2009)   HDL: 64.40 (10/21/2009)   LDL: 92 (10/21/2009)   TG: 62.0 (10/21/2009)  Problem # 3:  GERD (ICD-530.81) declines meds, symtpoms overall mild and improved  Problem # 4:  FLUSHING (ICD-782.62) ? due to anxiety vs other - to check labs Orders: T-Urine 24 Hr. Catecholamines [phone]) T-Urine 24 Hr. Metanephrines [phone])  Complete Medication List: 1)  Fentanyl 75 Mcg/hr Pt72 (Fentanyl) .Marland Kitchen.. 1 patch every other day 2)  Vivactil 10 Mg Tabs (Protriptyline hcl) .... 4 by mouth qam  Other Orders: Admin 1st Vaccine (36644) Flu Vaccine 15yrs + (03474)  Patient Instructions: 1)  you had the flu shot today 2)  Continue all previous medications as before this  visit 3)  Please go to the Lab in the basement for your blood and/or urine tests today  4)  Please schedule a follow-up appointment in 1 year or sooner if needed     Flu Vaccine Consent Questions     Do you have a history of severe allergic reactions to this vaccine? no    Any prior history of allergic reactions to egg and/or gelatin? no    Do you have a sensitivity to the preservative Thimersol? no    Do you have a past history of Guillan-Barre Syndrome? no    Do you currently have an acute febrile illness? no    Have you ever had a severe reaction to latex? no    Vaccine information given and explained to patient? yes    Are you currently pregnant? no    Lot Number:[id number]    Exp Date:02/23/2010   Site Given  Left Deltoid IMflu

## 2010-09-26 NOTE — Letter (Signed)
Summary: Radiation Oncology/[facility]  Radiation Oncology/[facility]   Imported By: Sherian Rein 09/26/2009 07:58:25  _____________________________________________________________________  External Attachment:    Type:   Image     Comment:   External Document

## 2010-09-26 NOTE — Miscellaneous (Signed)
Summary: [facility] Previsit/prep  Clinical Lists Changes  Medications: Added new medication of MOVIPREP 100 GM  SOLR (PEG-KCL-NACL-NASULF-NA ASC-C) As per prep instructions. - Signed Rx of MOVIPREP 100 GM  SOLR (PEG-KCL-NACL-NASULF-NA ASC-C) As per prep instructions.;  #1 x 0;  Signed;  Entered by: Wyona Almas RN;  Authorized by: Hilarie Fredrickson MD;  Method used: [facility] [address] [phone]*, [address] [place], [place], [place]  [address], Ph: [phone] or [phone], Fax: [phone] Observations: Added new observation of ALLERGY REV: Done (11/16/2009 12:50)    Prescriptions: MOVIPREP 100 GM  SOLR (PEG-KCL-NACL-NASULF-NA ASC-C) As per prep instructions.  #1 x 0   Entered by:   Wyona Almas RN   Authorized by:   Hilarie Fredrickson MD   Signed by:   Wyona Almas RN on 11/16/2009   Method used:   [facility]        [facility] [address] [phone]* (retail)       [address]       [place], [place]  [address]       Ph: [phone] or [phone]       Fax: [phone]   RxID:   [id number]

## 2010-09-26 NOTE — Assessment & Plan Note (Signed)
Summary: NPV/VENOUS INSUFFIENCY/JML    Visit Type:  Initial Consult Referring Provider:  Oliver Barre, MD Primary Provider:  Oliver Barre, MD  CC:  New PV evaluation per Dr.Ellison. Venous insufficiency.  History of Present Illness: 55 yo WF with history of HTN, hyperlipidemia, GERD and  lower extremity edema referred as a new patient for evaluation of lower extremity swelling and skin changes.   3 weeks ago patient woke up one morning with red, swollen feet and ankles.  Attributed to numerous baths she takes to help with chronic facial pain and absorption of her Fenatnyl patch.  Too painful to walk initially, she has overcome this and now describes mostly as tightness.   Some swelling in her legs, has been keeping her legs elevated and it has improved.   Has had similar episodes in past, mostly with increased edema but never before with the pain and tightness. She has had blisters over both legs, all of which were present two weeks ago and now have ruptured.  Does have episodic periods of shortness of breath, mostly at rest and not with exertion.  Last about 1-2 minutes and then improved.  No history of asthma or COPD.  No chest pain, no palpitations.    Current Medications (verified): 1)  Fentanyl 75 Mcg/hr Pt72 (Fentanyl) .Marland Kitchen.. 1 Patch Every Other Day 2)  Vivactil 10 Mg Tabs (Protriptyline Hcl) .... 4 By Mouth Qam  Allergies: 1)  ! Septra  Past History:  Past Medical History: Reviewed history from 10/25/2009 and no changes required. esophageal cyst trigeminal neuralgia s/p microdecompression - [facility] chronic pain syndrome Anemia-iron deficiency Anxiety Depression hx of interstitial cystitis Hyperlipidemia benign thyroid   cyst/goiter Allergic rhinitis fibrocystic breast disease hx of pyelonephritis 1989 surgical menopause at 55yo GERD Hypertension central sleep apnea  Past Surgical History: Hysterectomy microvascular decompression for chronic facial pain/  [facility] Oophorectomy Appendectomy s/p left breast biopsy - neg s/p exploratory abd surgury 12/87 - s/p hernia repair - femoral  Family History: mother with colon polyp, elevated cholesterol, HTN breast cancer:  Emelda Brothers father with  adult onset, recently diagnosed DM No FH of Colon Cancer: Family History of Heart Disease: Mother side of family - several uncles and grandfather died before age 65 of MI's sister with melanoma  Social History: no children Married work - Geologist, 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  Review of Systems       The patient complains of leg swelling and skin lesions.  The patient denies fever, weight gain/loss, chest pain, palpitations, shortness of breath, abdominal pain, nausea, vomiting, fatigue, malaise, vision loss, decreased hearing, hoarseness, prolonged cough, wheezing, sleep apnea, coughing up blood, blood in stool, diarrhea, heartburn, incontinence, blood in urine, muscle weakness, joint pain, rash, headache, fainting, dizziness, depression, anxiety, enlarged lymph nodes, easy bruising or bleeding, and environmental allergies.    Vital Signs:  Patient profile:   55 year old female Height:      66 inches Weight:      148.50 pounds BMI:     24.06 Pulse rate:   80 / minute Pulse rhythm:   regular Resp:     18 per minute BP sitting:   140 / 100  (left arm) Cuff size:   regular  Vitals Entered  By: Vikki Ports (May 12, 2010 2:42 PM)  Serial Vital Signs/Assessments:  Time      Position  BP       Pulse  Resp  Temp     By           R Arm     140/90                         Vikki Ports   Physical Exam  Additional Exam:  Gen: Well-developed, well-nourished, NAD, alert  and cooperative throughout exam HEENT: NCAT, PERRL, EOMI, no conjunctival inflammation, pharynx nl without erythema or exudates, MMM. External ear exam normal - TMs without bulging or erythema, good landmarks.  No nasal d/c Neck: No LAN, thyromegaly, masses, bruits CV: Regular rate and rhythm, no murmurs, rubs, or gallops.  PT pulses +2 bilaterally Lungs: Normal respiratory effort. CTAB no wheezes, rales, or rhonchi noted Abd: soft, nondistended, nontender.  BS+ and normoactive.  No hepatosplenomegaly.  No masses. Ext: chronic venous changes noted including +1 edema BL LE's.  Purplish hue to extremities.  Several 1x2 cm erythematous uclers which are currently healing, largest located on superior aspect of Left great toe. Neuro:  No focal deficits Musculoskeletal:  Strength 5/5 bilateral upper and lower extremities Psych:  No suicidal or homicidal ideations.  Not depressed or anxious appearing.      Impression & Recommendations:  Problem # 1:  VENOUS INSUFFICIENCY, LEGS (ICD-459.81) Most likely venous stasis based on history and physical exam.  Discussed Echo to rule out CHF, patient declined at this time.  Also discussed checking venous dopplers to rule out DVT (unlikely with bilateral disease) but she refused.  Will refer her to Dr. Arbie Cookey of [facility] for venous evaluation.   For symptomatic  relief in the short term, recommend exercise, leg elevation, and compression stockings.  For her ulcers, recommend antibiotic ointment and close attention to healing. She will follow up with [place] as needed.   Other Orders: EKG w/ Interpretation (93000) [facility] Referral ([facility] Ref)  Patient Instructions: 1)  Your physician recommends that you schedule a follow-up appointment as needed 2)  Your physician recommends that you continue on your current medications as directed. Please refer to the Current Medication list given to you today. 3)  You have been referred to Dr. Dorothey Baseman with Vein and Vascular Surgery.

## 2010-09-26 NOTE — Procedures (Signed)
Summary: Colonoscopy  Patient: Sheryl Lester Note: All result statuses are Final unless otherwise noted.  Tests: (1) Colonoscopy (COL)   COL Colonoscopy           DONE     [facility]     [address]     [place], [place]  [address]           COLONOSCOPY PROCEDURE REPORT           PATIENT:  Sheryl Lester, Sheryl Lester  MR#:  [id number]     BIRTHDATE:  06-18-1956, 54 yrs. old  GENDER:  female     ENDOSCOPIST:  Wilhemina Bonito. Eda Keys, MD     REF. BY:  Oliver Barre, M.D.     PROCEDURE DATE:  01/16/2010     PROCEDURE:  Average-risk screening colonoscopy     G0121     ASA CLASS:  Class II     INDICATIONS:  Routine Risk Screening     MEDICATIONS:   Fentanyl 50 mcg IV, Benadryl 50 mg IV, Versed 3 mg     IV           DESCRIPTION OF PROCEDURE:   After the risks benefits and     alternatives of the procedure were thoroughly explained, informed     consent was obtained.  Digital rectal exam was performed and     revealed no abnormalities.   The LB CF-[id number] endoscope     was introduced through the anus and advanced to the cecum, which     was identified by both the appendix and ileocecal valve, without     limitations.Time to cecum =5:00 min. The quality of the prep was     good, using MoviPrep.  The instrument was then slowly withdrawn     (time = 11:05 min) as the colon was fully examined.     <<PROCEDUREIMAGES>>           FINDINGS:  A normal appearing cecum, ileocecal valve, and     appendiceal orifice were identified. The ascending, hepatic     flexure, transverse, splenic flexure, descending, sigmoid colon,     and rectum appeared unremarkable.  No polyps or cancers were seen.     Retroflexed views in the rectum revealed no abnormalities.    The     scope was then withdrawn from the patient and the procedure     completed.           COMPLICATIONS:  None     ENDOSCOPIC IMPRESSION:     1) Normal colonoscopy     2) No polyps or cancers     RECOMMENDATIONS:     1) Continue  current colorectal screening recommendations for     "routine risk" patients with a repeat colonoscopy in 10 years.           ______________________________     Wilhemina Bonito. Eda Keys, MD           CC:  Corwin Levins, MD;The Patient           n.     Rosalie DoctorWilhemina Bonito. Eda Keys at 01/16/2010 01:48 PM           Luevenia Maxin, [phone]  Note: An exclamation mark (!) indicates a result that was not dispersed into the flowsheet. Document Creation Date: 01/16/2010 1:48 PM _______________________________________________________________________  (1) Order result status: Final Collection or observation date-time: 01/16/2010 13:43 Requested date-time:  Receipt date-time:  Reported date-time:  Referring Physician:   Ordering Physician: Fransico Setters [phone]) Specimen Source:  Source: Launa Grill Order Number: [id number] Lab site:   Appended Document: Colonoscopy    Clinical Lists Changes  Observations: Added new observation of COLONNXTDUE: 12/2019 (01/16/2010 15:55)

## 2010-09-26 NOTE — Letter (Signed)
Summary: [facility]   Imported By: Lester [place] 11/11/2009 09:34:15  _____________________________________________________________________  External Attachment:    Type:   Image     Comment:   External Document

## 2010-09-26 NOTE — Progress Notes (Signed)
Summary: Triage   Phone Note Call from Patient Call back at Home Phone [phone]   Caller: Patient Call For: Dr. Marina Goodell Reason for Call: Talk to Nurse Summary of Call: pt has a colon sch'd on Wed. 5-25 and thought it was today. She has already taken the prep. Initial call taken by: Karna Christmas,  Jan 16, 2010 8:04 AM  Follow-up for Phone Call        Will be r/s to 12:30 today.Pt. aware and re-instructed. Follow-up by: Teryl Lucy RN,  Jan 16, 2010 8:31 AM

## 2010-09-26 NOTE — Letter (Signed)
Summary: [facility]  [facility]   Imported By: Sherian Rein 09/30/2009 14:22:52  _____________________________________________________________________  External Attachment:    Type:   Image     Comment:   External Document

## 2010-09-26 NOTE — Letter (Signed)
Summary: [facility]  [facility]   Imported By: Sherian Rein 09/12/2009 10:53:25  _____________________________________________________________________  External Attachment:    Type:   Image     Comment:   External Document

## 2010-09-26 NOTE — Letter (Signed)
[facility]: [facility]   Imported By: Lester Gilliam 06/27/2010 10:00:41  _____________________________________________________________________  External Attachment:    Type:   Image     Comment:   External Document

## 2010-09-26 NOTE — Assessment & Plan Note (Signed)
Summary: SWELLING IN ANKLE/NWS   Vital Signs:  Patient profile:   55 year old female Height:      66 inches (167.64 cm) Weight:      151.25 pounds (68.75 kg) BMI:     24.50 O2 Sat:      98 % on Room air Temp:     97.9 degrees F (36.61 degrees C) oral Pulse rate:   86 / minute BP sitting:   122 / 82  (left arm) Cuff size:   regular  Vitals Entered By: Brenton Grills MA (May 03, 2010 10:53 AM)  O2 Flow:  Room air CC: swelling in lower legs and ankles/aj   Referring Provider:  Oliver Barre, MD Primary Provider:  Oliver Barre, MD  CC:  swelling in lower legs and ankles/aj.  History of Present Illness: 10 days of moderate "tightness" of the legs, and associated redness.  she says she has had redness there before, but not as bad, or for as long, as this episode.  she has now developed blisters there, which she has never had before.    Current Medications (verified): 1)  Fentanyl 75 Mcg/hr Pt72 (Fentanyl) .Marland Kitchen.. 1 Patch Every Other Day 2)  Vivactil 10 Mg Tabs (Protriptyline Hcl) .... 4 By Mouth Qam  Allergies (verified): 1)  ! Septra  Past History:  Past Medical History: Last updated: 10/25/2009 esophageal cyst trigeminal neuralgia s/p microdecompression - [facility] chronic pain syndrome Anemia-iron deficiency Anxiety Depression hx of interstitial cystitis Hyperlipidemia benign thyroid   cyst/goiter Allergic rhinitis fibrocystic breast disease hx of pyelonephritis 1989 surgical menopause at 55yo GERD Hypertension central sleep apnea  Review of Systems  The patient denies fever and dyspnea on exertion.    Physical Exam  Extremities:  both legs are similar: moderate erythema, but only minimal warmth and tenderness. there are 2 shallow ulcers on each leg, and 1 on the left great toe.  all are healing well.   trace right pedal edema and trace left pedal edema.     Impression & Recommendations:  Problem # 1:  VENOUS INSUFFICIENCY, LEGS (ICD-459.81) Assessment  New  Other Orders: [facility] (Vascular) Est. Patient Level III ([address])  Patient Instructions: 1)  refer vein specialist.  you will be called with a day and time for an appointment. 2)  elevate your legs as best you can.

## 2010-09-28 NOTE — Assessment & Plan Note (Signed)
Summary: DIZZY/ COLD SYMPTOMS/NWS   Vital Signs:  Patient profile:   55 year old female Height:      66 inches (167.64 cm) Weight:      131.8 pounds (59.91 kg) O2 Sat:      98 % on Room air Temp:     98.6 degrees F (37.00 degrees C) oral Pulse rate:   118 / minute BP sitting:   124 / 84  (left arm) Cuff size:   regular  Vitals Entered By: Orlan Leavens RMA (August 31, 2010 1:41 PM)  O2 Flow:  Room air CC: Cold sxs Is Patient Diabetic? No Pain Assessment Patient in pain? no        Primary Care Provider:  Oliver Barre, MD  CC:  Cold sxs.  History of Present Illness: here with 2-3 days acute onset fever, ST, prod cough with greenish sputum, with general weakness and malaise. Pt denies CP, worsening sob, doe, wheezing, orthopnea, pnd, worsening LE edema, palps, dizziness or syncope  Pt denies new neuro symptoms such as worsening headache, facial or extremity weakness  Overall pain meds working well for trigeminal neuralgia, Overall good compliance with meds, and good tolerability.  Denies worsening depressive symptoms, suicidal ideation, or panic. , though has ongoing anxiety due to ongoing social stressors, though some less recently.  Pt denies polydipsia, polyuria   Overall good compliance with meds, trying to follow low chol  diet, wt stable,.   Has central apnea for which she now uses night BiPap.    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) 4)  Venous Insufficiency, Legs  (ICD-459.81) 5)  Flushing  (ICD-782.62) 6)  Primary Central Sleep Apnea  (ICD-327.21) 7)  Thyroid Nodule, Left  (ICD-241.0) 8)  Otitis Media, Serous, Chronic  (ICD-381.10) 9)  Otitis Media, Serous, Acute  (ICD-381.01) 10)  Screening Colorectal-cancer  (ICD-V76.51) 11)  Dysphagia  (ICD-787.29) 12)  Tia  (ICD-435.9) 13)  Hypertension  (ICD-401.9) 14)  Otitis Media, Acute, Bilateral  (ICD-382.9) 15)   Elevated Blood Pressure Without Diagnosis of Hypertension  (ICD-796.2) 16)  Gerd  (ICD-530.81) 17)  Dysphagia Unspecified  (ICD-787.20) 18)  Preventive Health Care  (ICD-V70.0) 19)  Unspecified Hearing Loss  (ICD-389.9) 20)  Sinusitis- Acute-nos  (ICD-461.9) 21)  Swelling Mass or Lump in Head and Neck  (ICD-784.2) 22)  Asthmatic Bronchitis, Acute  (ICD-466.0) 23)  Trigeminal Neuralgia  (ICD-350.1) 24)  Allergic Rhinitis  (ICD-477.9) 25)  Hyperlipidemia  (ICD-272.4) 26)  Depression  (ICD-311) 27)  Anxiety  (ICD-300.00) 28)  Anemia-iron Deficiency  (ICD-280.9)  Medications Prior to Update: 1)  Fentanyl 75 Mcg/hr Pt72 (Fentanyl) .Marland Kitchen.. 1 Patch Every Other Day 2)  Vivactil 10 Mg Tabs (Protriptyline Hcl) .... 4 By Mouth Qam  Current Medications (verified): 1)  Fentanyl 75 Mcg/hr Pt72 (Fentanyl) .Marland Kitchen.. 1 Patch Every Other Day 2)  Vivactil 10 Mg Tabs (Protriptyline Hcl) .... 4 By Mouth Qam 3)  Levofloxacin 500 Mg Tabs (Levofloxacin) .Marland Kitchen.. 1po Once Daily 4)  Robafen Ac 100-10 Mg/74ml Syrp (Guaifenesin-Codeine) .Marland Kitchen.. 1 Tsp By Mouth Q 6 Hrs As Needed Cough 5)  Bipap Nightly  Allergies (verified): 1)  ! Septra  Past History:  Past Medical History: Last updated: 10/25/2009 esophageal cyst trigeminal neuralgia s/p microdecompression - [facility] chronic pain syndrome Anemia-iron deficiency Anxiety Depression hx of interstitial cystitis Hyperlipidemia benign thyroid   cyst/goiter Allergic rhinitis fibrocystic breast disease hx of pyelonephritis 1989  surgical menopause at 55yo GERD Hypertension central sleep apnea  Past Surgical History: Last updated: 05/12/2010 Hysterectomy microvascular decompression for chronic facial pain/ [facility] Oophorectomy Appendectomy s/p left breast biopsy - neg s/p exploratory abd surgury 12/87 - s/p hernia repair - femoral  Social History: Last updated: 08/31/2010 no children Married work - Geologist, 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  Risk Factors: Exercise: yes (12/29/2008)  Risk Factors: Smoking Status: never (01/20/2008)  Social History: Reviewed history from 05/12/2010 and no changes required. 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  Review of Systems       all otherwise negative per pt -    Physical Exam  General:  alert and well-developed.  , mild ill  Head:  normocephalic and atraumatic.   Eyes:  vision grossly intact, pupils equal, and pupils round.   Ears:  bilat tm's red, sinus nontender Nose:  nasal dischargemucosal pallor and mucosal edema.   Mouth:  pharyngeal erythema and fair dentition.   Neck:  supple and cervical lymphadenopathy.   Lungs:  normal respiratory effort, R decreased breath sounds, and L decreased breath sounds.  but no rales or wheezing Heart:  normal rate and regular rhythm.   Extremities:  no edema, no erythema  Psych:  not anxious appearing and not depressed appearing.     Impression & Recommendations:  Problem # 1:  BRONCHITIS-ACUTE (ICD-466.0)  Her updated medication list for this problem includes:    Levofloxacin 500 Mg Tabs (Levofloxacin) .Marland Kitchen... 1po once daily    Robafen Ac 100-10 Mg/36ml Syrp (Guaifenesin-codeine) .Marland Kitchen... 1 tsp by mouth q 6 hrs as needed cough treat as above, f/u any worsening signs or symptoms   Problem # 2:  HYPERTENSION (ICD-401.9)  BP today: 124/84 Prior BP: 140/100 (05/12/2010)  Labs Reviewed: K+: 4.7  (10/21/2009) Creat: : 0.7 (10/21/2009)   Chol: 169 (10/21/2009)   HDL: 64.40 (10/21/2009)   LDL: 92 (10/21/2009)   TG: 62.0 (10/21/2009) stable overall by hx and exam, ok to continue meds/tx as is - no meds needed at this time  Problem # 3:  ANXIETY (ICD-300.00)  Her updated medication list for this problem includes:    Vivactil 10 Mg Tabs (Protriptyline hcl) .Marland KitchenMarland KitchenMarland KitchenMarland Kitchen 4 by mouth qam stable overall by hx and exam, ok to continue meds/tx as is - no meds needed at this time   Problem # 4:  TRIGEMINAL NEURALGIA (ICD-350.1) stable overall by hx and exam, ok to continue meds/tx as is  - sees pain management  Complete Medication List: 1)  Fentanyl 75 Mcg/hr Pt72 (Fentanyl) .Marland Kitchen.. 1 patch every other day 2)  Vivactil 10 Mg Tabs (Protriptyline hcl) .... 4 by mouth qam 3)  Levofloxacin 500 Mg Tabs (Levofloxacin) .Marland Kitchen.. 1po once daily 4)  Robafen Ac 100-10 Mg/54ml Syrp (Guaifenesin-codeine) .Marland Kitchen.. 1 tsp by mouth q 6 hrs as needed cough 5)  Bipap Nightly   Patient Instructions: 1)  Please take all new medications as prescribed 2)  Continue all previous medications as before this visit  3)  Please schedule a follow-up appointment in 2 months for CPX with labs Prescriptions: ROBAFEN AC 100-10 MG/5ML SYRP (GUAIFENESIN-CODEINE) 1 tsp by mouth q 6 hrs as needed cough  #6oz x 1   Entered and Authorized by:   Corwin Levins MD   Signed by:   Corwin Levins MD on 08/31/2010   Method used:   Print then Give to Patient   RxID:   [id number] LEVOFLOXACIN 500 MG TABS (LEVOFLOXACIN) 1po once daily  #10 x 0   Entered and Authorized by:   Corwin Levins MD   Signed by:   Corwin Levins MD on 08/31/2010   Method used:   Print then Give to Patient   RxID:   [id number]    Orders Added: 1)  Est. Patient Level IV [34193]

## 2011-01-09 NOTE — Procedures (Signed)
LOWER EXTREMITY VENOUS REFLUX EXAM   INDICATION:  Rule out venous reflux.   EXAM:  Using color-flow imaging and pulse Doppler spectral analysis, the  bilateral common femoral, superficial femoral, popliteal, posterior  tibial, greater and lesser saphenous veins are evaluated.  There is  evidence suggesting deep venous reflux of >500 milliseconds noted in the  right common femoral vein.  There is no evidence suggesting deep venous  insufficiency in the left lower extremity.   The right saphenofemoral junction is not competent with Reflux of  >59milliseconds. The left saphenofemoral junction and bilateral non-  tortuous GSV's are competent.  Partially occlusive chronic thrombus  noted in the bilateral greater saphenous veins at the distal thigh and  knee level.   The bilateral proximal short saphenous veins appear totally occluded  with chronic thrombus.   GSV Diameter (used if found to be incompetent only)                                            Right    Left  Proximal Greater Saphenous Vein           cm       cm  Proximal-to-mid-thigh                     cm       cm  Mid thigh                                 cm       cm  Mid-distal thigh                          cm       cm  Distal thigh                              cm       cm  Knee                                      cm       cm   IMPRESSION:  1. Bilateral greater saphenous veins are competent.  2. Deep venous reflux is noted, as described above.   ___________________________________________  Larina Earthly, M.D.   CH/MEDQ  D:  06/06/2010  T:  06/06/2010  Job:  [id number]

## 2011-01-09 NOTE — Consult Note (Signed)
NEW PATIENT CONSULTATION   Sheryl Lester, Sheryl Lester  DOB:  03/26/1956                                       06/06/2010  CHART#:[id number]   Patient presents today for evaluation of lower extremity symptoms.  She  is a rather pleasant healthy 55 year old white female with an unusual  complex of changes in her lower extremities bilaterally.  She has  discoloration and thickening of her skin from her mid calf distally  bilaterally.  She also has similar pattern of thickening, erythema, and  superficial abrasions over both the proximal lateral thighs.  She does  not have any history of DVT.  She reports that several weeks ago she had  marked swelling in both lower extremities from her knees distally, and  this is now partially resolved.  She has no major medical difficulties.  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  She has had a prior history of  surgery with appendectomy, hysterectomy, decompression disk surgery,  cystoscopies for interstitial cystitis, benign lumpectomy in her right  breast, and a hernia repair.   SOCIAL HISTORY:  She is married.  She does not have children.  She does  not work outside the home.  She does not smoke or drink alcohol.   FAMILY HISTORY:  Negative for premature atherosclerotic disease.   REVIEW OF SYSTEMS:  No weight loss or gain.  Weight is 148 pounds.  She  is 5 feet 6 inches tall.  She does report pain in her legs with walking.  GI:  Positive for reflux and difficulty swallowing.  NEUROLOGIC:  Positive for dizziness.  URINARY:  Positive for renal disease, burning with urination, frequent  urination, and difficulty with urination.  PSYCHIATRIC:  Positive for anxiety.  SKIN:  Noted for ulcerations, as described in the HPI.  Her review of systems are otherwise negative.   PHYSICAL EXAMINATION:  A well-developed and well-nourished white female  appearing stated age  in no acute distress.  Blood pressure 136/100,  pulse of 112.  Respirations are 18.  HEENT is normal.  She is in no  acute distress.  Her posterior tibial pulses are 2+ bilaterally.  She  does not have any evidence of venous varicosities or spider vein  telangiectasia.  She does have rubrous changes of thickened skin in both  legs circumferential, extending down onto her feet bilaterally.   She underwent a venous duplex in our office, and this shows no  significant reflux in her superficial or deep system.  I am unclear as  to the etiology of her skin changes and swelling.  She does not have any  evidence of arterial or venous pathology to explain this.   She was reassured with this and will continue to follow up with Dr. Jonny Ruiz  for further evaluation.  This may be dermatologic manifestation of some  sort of a systemic disease, requiring further workup.  She will see [place]  again on an as-needed basis.     Larina Earthly, M.D.  Electronically Signed   TFE/MEDQ  D:  06/06/2010  T:  06/07/2010  Job:  [id number]   cc:   Gregary Signs A. Everardo All, MD  Corwin Levins, MD

## 2011-01-09 NOTE — Op Note (Signed)
NAMEKEMARIA, DEDIC             ACCOUNT NO.:  [id number]   MEDICAL RECORD NO.:  [id number]          PATIENT TYPE:  AMB   LOCATION:  [facility]                         FACILITY:  [facility]   PHYSICIAN:  Jamison Neighbor, M.D.  DATE OF BIRTH:  12-21-1955   DATE OF PROCEDURE:  DATE OF DISCHARGE:                               OPERATIVE REPORT   PREOPERATIVE DIAGNOSIS:  Interstitial cystitis.   POSTOPERATIVE DIAGNOSIS:  Interstitial cystitis.   PROCEDURE:  Cystoscopy, urethral calibration, hydrodistention of the  bladder, Marcaine and Pyridium instillation, Marcaine and Kenalog  injection.   SURGEON:  Jamison Neighbor, M.D.   ANESTHESIA:  General.   COMPLICATIONS:  None.   DRAINS:  None.   BRIEF HISTORY:  This is a 55 year old female who is known to have  interstitial cystitis.  She has a past history of ulcer formation.  She  is now to undergo cystoscopy and hydrodistention.  She understands the  risks and benefits of the procedure and gave full informed consent.   DESCRIPTION OF PROCEDURE:  After successful induction of general  anesthesia, the patient was placed in the dorsal position, prepped with  Betadine and draped in the usual sterile fashion.  Careful inspection of  her lower abdomen and perineum showed that the patient had several burn  marks from the excessive use of heating pads.  She had no significant  cystocele or rectocele and no enterocele.  There were no masses on  bimanual exam.  The urethra was calibrated at 32-[place] female  urethral sounds with no evidence of stenosis or stricture.  The  cystoscope was inserted.  The bladder was carefully inspected.  No  tumors or stones could be seen.  The ureteral orifices were unremarkable  in their appearance.  Hydrodistention of the bladder was performed.  The  bladder was distended at a pressure of 100 cm of water for 5 minutes  when the bladder was drained.  The patient had significant  glomerulations throughout the  entire bladder, but no ulcer formation.  This was somewhat of an improvement of her previous evaluations.  She  had a much better bladder capacity at almost 1100 mL which is  approaching normal.  The bladder was drained.  A mixture of Marcaine and  Pyridium was left in the bladder.  Marcaine and Kenalog were injected  periurethrally.  The patient tolerated the procedure and was taken to  the recovery room in good condition.  The  patient is already on chronic narcotics from pain clinic and will not be  given additional pain medication.  She will, however, receive a  prescription for Pyridium Plus and doxycycline.  She will return to see  me in followup in 3 weeks' time.           ______________________________  Jamison Neighbor, M.D.  Electronically Signed     RJE/MEDQ  D:  12/31/2006  T:  12/31/2006  Job:  [id number]

## 2011-01-12 NOTE — Consult Note (Signed)
[facility]  Patient:    Sheryl Lester, Sheryl Lester Visit Number: [id number] MRN: [id number]          Service Type: PMG Location: TPC Attending Physician:  Sondra Come Dictated by:   Sondra Come, D.O. Admit Date:  08/18/2001                            Consultation Report  REFERRING PHYSICIAN:  Dr. Oliver Barre, [facility].  Dear Dr. Jonny Ruiz:  Thank you very much for kindly referring Ms. Brittnee Gaetano to the [facility]. The patient was evaluated in our clinic today. Please refer to the following for details regarding the history, physical examination, and treatment plan. Once again, thank you for allowing [place] to participate in the care of Ms. Earlene Plater.  CHIEF COMPLAINT:  Right-sided facial pain, trigeminal neuralgia.  HISTORY OF PRESENT ILLNESS:  Sheryl Lester is a pleasant, 55 year old, left-hand dominant female who [facility] to [facility] today complaining of history of right trigeminal neuralgia. The patient states she was diagnosed with this in May 1999 and followed at the [facility]. Her neurologist there has recently left clinical medicine for research and the patient was referred for pain management. The patient states she underwent microvascular decompressive surgery in July 2001 with some improvement. She is still left with some atypical type facial pain. She denies any significant triggering of her pain although she states that cold weather frequently exacerbates her facial pain. She has not had any type of injection therapy. She also states that she has had temporomandibular joint dysfunction ruled out by an oromaxillofacial surgeon but continues to have some clicking in her jaws. She has been treated with numerous medications including Neurontin up to 3600 mg a day with poor tolerance, Tegretol, Topamax, amitriptyline which caused her  to gain weight, Effexor which helped only for a limited period of time, Pamelor also helped only for a limited period of time, Ultram which helped modestly and was discontinued secondary to her physician not wanting her to be on 2 different types of pain medication, prednisone without relief, nonsteroidal anti-inflammatory medications without relief, and trazodone without any relief of pain but with some improved sleep. The patient was finally started on Duragesic and currently uses 50 mcg patch with significant relief. She states that when she initially started the patch she felt like a normal person again but now she feels that the patch is losing some of its efficacy. Her pain today is a 2/10 on a subjective scale. Her function and quality of life indices have improved significantly with the use of Duragesic. Her sleep is fair. Her pain symptoms are described as constant, throbbing, burning, and stabbing, worse with cold and stress, and improved with heat, ice, and medications. She has had nerve studies as well as radiologic imaging studies which are not available for me for my review at this time. I review health and history form and 14-point review of systems. The patient has had a history of headaches in the past but denies these at the present time. She has not had blurry vision, double vision, neck pain.  PAST MEDICAL HISTORY: 1. Mild depression and anxiety. 2. Postmenopausal symptoms. 3. Trigeminal neuralgia.  PAST SURGICAL HISTORY: 1. Microvascular decompression July 2000. 2. Complete hysterectomy. 3. Breast tumor biopsy (benign).  FAMILY HISTORY:  Heart disease.  SOCIAL HISTORY:  Denies smoking  or alcohol use. She is married and is not currently working.  ALLERGIES:  No known drug allergies.  MEDICATIONS: 1. Duragesic 50 mcg patch q.72h. 2. Estratest.  PHYSICAL EXAMINATION:  GENERAL:  A healthy female in no acute distress.  VITAL SIGNS:  Blood pressure 142/87,  pulse 98, respirations 12, O2 saturation 98% on room air.  HEENT:  Examination of the head and neck does not reveal any significant asymmetries. There is no discoloration. There is no allodynia or hyperpathia with palpation of the face. Range of motion of the neck is full. No sensory deficits noted. Cranial nerves are grossly intact. Examination of the temporomandibular joints reveals some crepitus bilaterally without discomfort.  IMPRESSION:  Atypical facial pain with probable trigeminal neuralgia.  PLAN: 1. Continue Duragesic patch 50 mcg q.72h. The patient has approximately    a 3-week supply remaining. 2. Will refer the patient to Dr. Stevphen Rochester for evaluation for a trigeminal    ganglion block to facilitate pain management. 3. Consider trial of Gabitril and possibly reinitiating Topamax. 4. Would consider reinitiating trazodone for sleep. 5. The patient to return to clinic next week to see Dr. Stevphen Rochester. I will    have the patient sign a release to get medical records from [facility].  The patient was educated on the above findings and recommendations and understands. There were no barriers to communication. Dictated by:   Sondra Come, D.O. Attending Physician:  Sondra Come DD:  08/21/01 TD:  08/21/01 Job: [id number]

## 2011-01-12 NOTE — Discharge Summary (Signed)
NAMEHARUNA, Sheryl Lester             ACCOUNT NO.:  [id number]   MEDICAL RECORD NO.:  [id number]          PATIENT TYPE:  IPS   LOCATION:  0505                          FACILITY:  [facility]   PHYSICIAN:  Jeanice Lim, M.D. DATE OF BIRTH:  02-Feb-1956   DATE OF ADMISSION:  11/21/2004  DATE OF DISCHARGE:  11/28/2004                                 DISCHARGE SUMMARY   IDENTIFYING DATA:  This is a 55 year old Caucasian female, married,  involuntarily admitted, presenting with a history of panic attacks with  decreased appetite, suicidal ideation, and plan.   MEDICATIONS:  1.  Effexor.  2.  Zyrtec.  3.  Levaquin.  4.  Doxepin.  5.  Fentanyl patch.  6.  Elmiron.   ALLERGIES:  SEPTRA.   PHYSICAL EXAMINATION:  Physical and neurological exam within normal limits.   MENTAL STATUS EXAM:  Significant findings:  The patient had thoughts of  suicidal ideation with plan to overdose, was depressed anxious. Otherwise no  psychotic symptoms. Cognition intact. Judgment and insight poor.   ADMISSION DIAGNOSES:   AXIS I:  Major depressive disorder, recurrent, severe, rule out anxiety  disorder not otherwise specified.   AXIS II:  None.   AXIS III:  Atypical facial pain and migraine headaches.   AXIS IV:  Moderate.   AXIS V:  20/60.   HOSPITAL COURSE:  The patient was admitted and ordered routine p.r.n.  medications, monitored medically and for safety, participated in aftercare  planning. Was stabilized on medications. Sleep restored, and aftercare  planning as well as therapy and medication monitor was in place. At the time  of discharge, the patient was improved. No dangerous ideation or psychotic  symptoms. Feeling improved ability to cope with good medication education  and discharged on:  1.  Seroquel 25 mg at 8.  2.  Elavil 25 mg at 8.  3.  Effexor XR 37.5 mg q.a.m.  4.  Lamictal 25 mg 1 until April 12, then 2 on April 13 q.a.m.  5.  Continue home medications, Zyrtec, Elmiron and  fentanyl.  6.  Celexa 20 mg 2 at 6 p.m.  7.  Valium 5 mg at 6 p.m.  8.  Ambien 10 mg q.h.s. p.r.n.   The patient was to follow up with [facility] in the  outpatient clinic starting the day after her discharge with [name] and Dr. Evelene Croon following completion of IOP program.   DISCHARGE DIAGNOSES:   AXIS I:  Major depressive disorder, recurrent, severe, rule out anxiety  disorder not otherwise specified.   AXIS II:  None.   AXIS III:  Atypical facial pain and migraine headaches.   AXIS IV:  Moderate.   AXIS V:  55.      JEM/MEDQ  D:  01/04/2005  T:  01/04/2005  Job:  [id number]

## 2011-01-12 NOTE — Op Note (Signed)
Sheryl Lester, Sheryl Lester             ACCOUNT NO.:  [id number]   MEDICAL RECORD NO.:  [id number]          PATIENT TYPE:  AMB   LOCATION:  [facility]                         FACILITY:  [facility]   PHYSICIAN:  Jamison Neighbor, M.D.  DATE OF BIRTH:  [birth year]   DATE OF PROCEDURE:  08/08/2004  DATE OF DISCHARGE:                                 OPERATIVE REPORT   PREOPERATIVE DIAGNOSIS:  Chronic pelvic pain, possible interstitial  cystitis.   POSTOPERATIVE DIAGNOSIS:  Chronic pelvic pain, possible interstitial  cystitis.   PROCEDURE:  Cystoscopy, urethral calibration, hydrodistention of the  bladder, bilateral retrogrades to evaluate hematuria, bladder biopsy,  Marcaine and Pyridium instillation, Marcaine and Kenalog injection.   SURGEON:  Jamison Neighbor, M.D.   ANESTHESIA:  General.   COMPLICATIONS:  None.   DRAINS:  None.   BRIEF HISTORY:  This 55 year old female was evaluated by Dr. Aldean Ast for  what he presumed to be interstitial cystitis.  Patient had a potassium test  that turned out negative.  We did note that the patient was already on  narcotics, which generally will cause a false positive potassium test.  The  patient has been treated with DMSL on an intermittent basis by a urologist  down in [place], and we do note that she has had cystoscopic evaluation  in the past.  She has undergone hydrodistention in the past, however, it was  done without anesthesia, so the patient is unaware of the results of any  findings having to do with hydrodistention.  The patient has undergone an  evaluation for hematuria.  She had a negative CT scan. When the patient  completed the prep for the CT scan, she actually felt somewhat better.  The  reason for this is unclear.  The patient would still, however, like to  undergo the cystoscopy, in part, to complete the evaluation for hematuria,  in part to determine if she actually has IC, and with the hope that she  might get some long-term  benefit.  The patient understands the risks and  benefits of the procedure and gave full informed consent.  She will undergo  retrograde studies as part of her hematuria evaluation at the same time.   PROCEDURE:  After the successful induction of general anesthesia, the  patient is placed in a dorsal lithotomy position, prepped with Betadine, and  draped in the usual sterile fashion.  The urethra was calibrated to a 73  [place] with female urethral sounds with no evidence of stenosis or  stricture.  The cystoscope was inserted.  The bladder was carefully  inspected with both 12 degree and 70 degree lenses.  No tumor or stones  could be seen.  There were no mucosal abnormalities of any kind.  The  retrograde studies were performed on each side, showing completely normal  upper tracts with normal drain-out films and no sign of obstruction or  filling defect.  The bladder was then distended at a pressure of 100 cm of  water for five minutes.  When the bladder was drained, the bladder capacity  was found to be nearly normal at  1000 cc.  There were modest glomerulations,  and this certainly would be consistent with a very minimal case of  interstitial cystitis.  The patient clearly does not have widespread disease  or extensive problems with her bladder.  The patient had a bladder biopsy  and was sent for mast cell analysis.  The biopsy site was cauterized.  The  bladder was drained.  A mixture of Marcaine and Pyridium was left in the  bladder.  Marcaine and Kenalog were injected periurethrally.  The patient  received intraoperative Toradol, Zofran, and a B&O suppository.  She had a  small gauze pack placed into the vagina, and she was removed to the recovery  room.  She will be sent home with Lorcet Plus that she has used for  breakthrough pain unresponsive to her baseline fentanyl that she takes for  atypical facial pain.  She also will go home with Pyridium Plus to use on a  p.r.n. basis and  a short course of doxycycline.  She will return to the  office in followup, at which time we will make a decision to see whether she  needs aggressive therapy for her possible IC or if the patient will simply  like to be followed long-term with intermittent instillation on a p.r.n.  basis.      RJE/MEDQ  D:  08/08/2004  T:  08/08/2004  Job:  [id number]

## 2011-01-12 NOTE — Consult Note (Signed)
[facility]  Patient:    Sheryl Lester, Sheryl Lester Visit Number: [id number] MRN: [id number]          Service Type: PMG Location: TPC Attending Physician:  Sheryl Lester Dictated by:   Sheryl Lester, M.D. Admit Date:  08/18/2001   CC:         Sheryl Lester, M.D. [facility]   Consultation Report  REASON FOR CONSULTATION:  Sheryl [facility] as a kind referral from Dr. Corwin Lester.  She is a pleasant 55 year old who has had decline in functional indices and quality-of-life indices secondary to right facial pain described as atypical facial pain by multiple practitioners; she has been seen by neurology, primary care and multiple specialists, subsequently arriving at the neurosurgeons arena, and has had a vascular decompression of the trigeminal nerve.  She states that this helped her only modestly, with persistent pain and she describes V1, 2 and sometimes V3 distribution.  She was primarily localized to V3 at one point, but is now more a global presentation.  She also has a modest glossopharyngeal pattern but no specific obvious triggering event, relating her pain today at 1-2/10 on a subjective scale but frequently this pain will awake her at night with notable pain in the V2-3 distribution.  She has no obvious triggering event and is a baseline constant pain described as throbbing, sometimes burning and stabbing, improved with heat and ice and sometimes medication.  It is made worse with cold and stress.  She has had multiple imaging; we will try to obtain these reports.  The only thing that has really helped her is Duragesic and she is compliant.  States no wish to harm self or others.  Other medications include Estratest and Fentanyl.  ALLERGIES:  No known drug allergies.  I review 14-point review of systems, health and history form.  I review hospitalizations, family and social history.  I reviewed Dr. Julien Nordmann  notes.  PHYSICAL EXAMINATION:  GENERAL:  Physical exam reveals a pleasant female sitting comfortably in bed. Gait, affect, appearance are normal.  Oriented x 3.  HEENT:  The facial exam is otherwise essentially normal with no obvious triggering events but she does have some malocclusion and evidence of TMJ; this is on the right side.  She has no obvious triggering etiology or pathology.  She has no allodynia or hyperalgia.  IMPRESSION:  Trigeminal neuralgia.  PLAN:  After a lengthy discussion of risks, complications and options, we are considering a trigeminal injection.  I discussed this with her.  We will continue her on Duragesic and I reviewed the risk profile with this medication and she accepts.  We will see her in followup, likely move toward considerations of trigeminal injection.  Discharge instructions given. Extensive consultation. Dictated by:   Sheryl Lester, M.D. Attending Physician:  Sheryl Lester DD:  08/26/01 TD:  08/27/01 Job: [id number]

## 2011-01-18 ENCOUNTER — Other Ambulatory Visit: Payer: Self-pay | Admitting: Internal Medicine

## 2011-03-01 ENCOUNTER — Encounter: Payer: Self-pay | Admitting: Internal Medicine

## 2011-03-01 DIAGNOSIS — Z0000 Encounter for general adult medical examination without abnormal findings: Secondary | ICD-10-CM | POA: Insufficient documentation

## 2011-03-02 ENCOUNTER — Ambulatory Visit (INDEPENDENT_AMBULATORY_CARE_PROVIDER_SITE_OTHER): Payer: PRIVATE HEALTH INSURANCE | Admitting: Internal Medicine

## 2011-03-02 ENCOUNTER — Encounter: Payer: Self-pay | Admitting: Internal Medicine

## 2011-03-02 VITALS — BP 120/84 | HR 104 | Temp 99.4°F | Ht 66.0 in | Wt 146.1 lb

## 2011-03-02 DIAGNOSIS — L989 Disorder of the skin and subcutaneous tissue, unspecified: Secondary | ICD-10-CM

## 2011-03-02 NOTE — Patient Instructions (Signed)
You will be contacted regarding the referral for: dermatology Continue all other medications as before

## 2011-03-02 NOTE — Progress Notes (Signed)
  Subjective:    Patient ID: Sheryl Lester, female    DOB: [birth year], 55 y.o.   MRN: [id number]  HPI  Here to f/u with request for skin check to face, has a dark mole to the left lateral cheek area that has increased in size, ? Slight raised, and sister had recent pre-malignant lesion removed as well, and encouraged her to seek referral. (sister is psychiatrist) Past Medical History  Diagnosis Date  . ALLERGIC RHINITIS 01/20/2008  . ANEMIA-IRON DEFICIENCY 01/20/2008  . ANXIETY 01/20/2008  . ASTHMATIC BRONCHITIS, ACUTE 05/19/2008  . CENTRAL SLEEP APNEA CONDS CLASSIFIED ELSEWHERE 08/31/2010  . DEPRESSION 01/20/2008  . DYSPHAGIA UNSPECIFIED 12/06/2008  . ELEVATED BLOOD PRESSURE WITHOUT DIAGNOSIS OF HYPERTENSION 12/06/2008  . GERD 12/06/2008  . HYPERLIPIDEMIA 01/20/2008  . HYPERTENSION 12/16/2008  . OTITIS MEDIA, ACUTE, BILATERAL 12/16/2008  . OTITIS MEDIA, SEROUS, CHRONIC 12/30/2008  . PVD 05/12/2010  . SINUSITIS- ACUTE-NOS 09/21/2008  . SWELLING MASS OR LUMP IN HEAD AND NECK 09/21/2008  . TIA 12/16/2008  . Trigeminal neuralgia 01/20/2008  . Unspecified hearing loss 09/21/2008  . VENOUS INSUFFICIENCY, LEGS 05/03/2010  . THYROID NODULE, LEFT 01/07/2009   Past Surgical History  Procedure Date  . Abdominal hysterectomy   . Microvascular decompression      for chronic facial pain/ [facility]  . Oophorectomy   . Appendectomy   . Breast surgery     Left breast biopsy, Benign  . Abdominal exploration surgery 07/1986    Hernia repair-femoral    reports that she has never smoked. She does not have any smokeless tobacco history on file. She reports that she does not drink alcohol or use illicit drugs. family history includes Breast cancer in her paternal aunt; Diabetes in her father; Heart disease in her other and paternal grandmother; Hyperlipidemia in her mother; Hypertension in her mother; and Melanoma in her sister. Allergies  Allergen Reactions  . Sulfamethoxazole W/Trimethoprim    Current Outpatient  Prescriptions on File Prior to Visit  Medication Sig Dispense Refill  . fentaNYL (DURAGESIC - DOSED MCG/HR) 75 MCG/HR Place 1 patch onto the skin every 3 (three) days.        . NON FORMULARY Bipap Nightly       . protriptyline (VIVACTIL) 10 MG tablet TAKE 4 TABLETS BY MOUTH EVERY MORNING  120 tablet  3   Review of Systems All otherwise neg per pt     Objective:   Physical Exam BP 120/84  Pulse 104  Temp(Src) 99.4 F (37.4 C) (Oral)  Ht 5\' 6"  (1.676 m)  Wt 146 lb 2 oz (66.282 kg)  BMI 23.59 kg/m2  SpO2 97% Physical Exam  VS noted Constitutional: Pt appears well-developed and well-nourished.  HENT: Head: Normocephalic.  Right Ear: External ear normal.  Left Ear: External ear normal.  Eyes: Conjunctivae and EOM are normal. Pupils are equal, round, and reactive to light.  Neck: Normal range of motion. Neck supple.  Cardiovascular: Normal rate and regular rhythm.   Pulmonary/Chest: Effort normal and breath sounds normal.  Abd:  Soft, NT, non-distended, + BS Neurological: Pt is alert. No cranial nerve deficit.  Skin: Skin is warm. No erythema. lesion to left lateral cheek area approx  8 mm, slight raised and irreg, nontender, no ulcer    Assessment & Plan:

## 2011-03-02 NOTE — Assessment & Plan Note (Signed)
Atypical, but cant r/o malignacy or pre-malignant ;  Ok for derm referral,  to f/u any worsening symptoms or concerns

## 2011-05-31 ENCOUNTER — Other Ambulatory Visit: Payer: Self-pay | Admitting: Internal Medicine

## 2011-06-26 ENCOUNTER — Telehealth: Payer: Self-pay

## 2011-06-26 ENCOUNTER — Other Ambulatory Visit: Payer: Self-pay | Admitting: Internal Medicine

## 2011-06-26 DIAGNOSIS — Z1231 Encounter for screening mammogram for malignant neoplasm of breast: Secondary | ICD-10-CM

## 2011-06-26 DIAGNOSIS — Z0000 Encounter for general adult medical examination without abnormal findings: Secondary | ICD-10-CM

## 2011-06-26 NOTE — Telephone Encounter (Signed)
Put order in for physical labs. 

## 2011-07-03 ENCOUNTER — Other Ambulatory Visit: Payer: Self-pay | Admitting: Internal Medicine

## 2011-07-03 ENCOUNTER — Other Ambulatory Visit (INDEPENDENT_AMBULATORY_CARE_PROVIDER_SITE_OTHER): Payer: PRIVATE HEALTH INSURANCE

## 2011-07-03 DIAGNOSIS — Z0000 Encounter for general adult medical examination without abnormal findings: Secondary | ICD-10-CM

## 2011-07-03 LAB — LIPID PANEL
Cholesterol: 208 mg/dL — ABNORMAL HIGH (ref 0–200)
HDL: 76.9 mg/dL (ref >39.00)
Total CHOL/HDL Ratio: 3
Triglycerides: 47 mg/dL (ref 0.0–149.0)
VLDL: 9.4 mg/dL (ref 0.0–40.0)

## 2011-07-03 LAB — URINALYSIS, ROUTINE W REFLEX MICROSCOPIC
Bilirubin Urine: NEGATIVE
Hgb urine dipstick: NEGATIVE
Ketones, ur: NEGATIVE
Leukocytes, UA: NEGATIVE
Nitrite: NEGATIVE
Specific Gravity, Urine: 1.015 (ref 1.000–1.030)
Total Protein, Urine: NEGATIVE
Urine Glucose: NEGATIVE
Urobilinogen, UA: 0.2 (ref 0.0–1.0)
pH: 7.5 (ref 5.0–8.0)

## 2011-07-03 LAB — CBC WITH DIFFERENTIAL/PLATELET
Basophils Absolute: 0 10*3/uL (ref 0.0–0.1)
Basophils Relative: 0.3 % (ref 0.0–3.0)
Eosinophils Absolute: 0.2 10*3/uL (ref 0.0–0.7)
Eosinophils Relative: 3.9 % (ref 0.0–5.0)
HCT: 40.4 % (ref 36.0–46.0)
Hemoglobin: 13.4 g/dL (ref 12.0–15.0)
Lymphocytes Relative: 20 % (ref 12.0–46.0)
Lymphs Abs: 1.1 10*3/uL (ref 0.7–4.0)
MCHC: 33.3 g/dL (ref 30.0–36.0)
MCV: 86.4 fl (ref 78.0–100.0)
Monocytes Absolute: 0.3 10*3/uL (ref 0.1–1.0)
Monocytes Relative: 5.3 % (ref 3.0–12.0)
Neutro Abs: 3.8 10*3/uL (ref 1.4–7.7)
Neutrophils Relative %: 70.5 % (ref 43.0–77.0)
Platelets: 291 10*3/uL (ref 150.0–400.0)
RBC: 4.67 Mil/uL (ref 3.87–5.11)
RDW: 13 % (ref 11.5–14.6)
WBC: 5.3 10*3/uL (ref 4.5–10.5)

## 2011-07-03 LAB — HEPATIC FUNCTION PANEL
ALT: 18 U/L (ref 0–35)
AST: 17 U/L (ref 0–37)
Albumin: 4.3 g/dL (ref 3.5–5.2)
Alkaline Phosphatase: 69 U/L (ref 39–117)
Bilirubin, Direct: 0.1 mg/dL (ref 0.0–0.3)
Total Bilirubin: 1 mg/dL (ref 0.3–1.2)
Total Protein: 7.6 g/dL (ref 6.0–8.3)

## 2011-07-03 LAB — BASIC METABOLIC PANEL
BUN: 9 mg/dL (ref 6–23)
CO2: 30 mEq/L (ref 19–32)
Calcium: 9.3 mg/dL (ref 8.4–10.5)
Chloride: 106 mEq/L (ref 96–112)
Creatinine, Ser: 0.7 mg/dL (ref 0.4–1.2)
GFR: 87.1 mL/min (ref >60.00)
Glucose, Bld: 94 mg/dL (ref 70–99)
Potassium: 4.7 mEq/L (ref 3.5–5.1)
Sodium: 141 mEq/L (ref 135–145)

## 2011-07-03 LAB — LDL CHOLESTEROL, DIRECT: Direct LDL: 123.1 mg/dL

## 2011-07-03 LAB — TSH: TSH: 0.61 u[IU]/mL (ref 0.35–5.50)

## 2011-07-05 ENCOUNTER — Encounter: Payer: Self-pay | Admitting: Internal Medicine

## 2011-07-05 ENCOUNTER — Ambulatory Visit (INDEPENDENT_AMBULATORY_CARE_PROVIDER_SITE_OTHER): Payer: PRIVATE HEALTH INSURANCE | Admitting: Internal Medicine

## 2011-07-05 VITALS — BP 142/100 | HR 103 | Temp 98.8°F | Ht 67.0 in | Wt 141.4 lb

## 2011-07-05 DIAGNOSIS — E785 Hyperlipidemia, unspecified: Secondary | ICD-10-CM | POA: Insufficient documentation

## 2011-07-05 DIAGNOSIS — Z0000 Encounter for general adult medical examination without abnormal findings: Secondary | ICD-10-CM

## 2011-07-05 DIAGNOSIS — C439 Malignant melanoma of skin, unspecified: Secondary | ICD-10-CM | POA: Insufficient documentation

## 2011-07-05 DIAGNOSIS — Z23 Encounter for immunization: Secondary | ICD-10-CM

## 2011-07-05 DIAGNOSIS — F411 Generalized anxiety disorder: Secondary | ICD-10-CM

## 2011-07-05 MED ORDER — CLONAZEPAM 0.5 MG PO TABS: 0.5000 mg | ORAL_TABLET | Freq: Two times a day (BID) | ORAL | Status: DC

## 2011-07-05 NOTE — Assessment & Plan Note (Signed)
Overall doing well, age appropriate education and counseling updated, referrals for preventative services and immunizations addressed, dietary and smoking counseling addressed, most recent labs and ECG reviewed.  I have personally reviewed and have noted: 1) the patient's medical and social history 2) The pt's use of alcohol, tobacco, and illicit drugs 3) The patient's current medications and supplements 4) Functional ability including ADL's, fall risk, home safety risk, hearing and visual impairment 5) Diet and physical activities 6) Evidence for depression or mood disorder 7) The patient's height, weight, and BMI have been recorded in the chart I have made referrals, and provided counseling and education based on review of the above ECG reviewed as per emr For flu shot today

## 2011-07-05 NOTE — Progress Notes (Signed)
Subjective:    Patient ID: Sheryl Lester, female    DOB: Jan 20, 1956, 55 y.o.   MRN: [id number]  HPI Here for wellness and f/u;  Overall doing ok;  Pt denies CP, worsening SOB, DOE, wheezing, orthopnea, PND, worsening LE edema, palpitations, dizziness or syncope.  Pt denies neurological change such as new Headache, facial or extremity weakness.  Pt denies polydipsia, polyuria, or low sugar symptoms. Pt states overall good compliance with treatment and medications, good tolerability, and trying to follow lower cholesterol diet.  Pt denies worsening depressive symptoms, suicidal ideation or panic. No fever, wt loss, night sweats, loss of appetite, or other constitutional symptoms.  Pt states good ability with ADL's, low fall risk, home safety reviewed and adequate, no significant changes in hearing or vision, and occasionally active with exercise. Has been sleepwalking on the fentanyl per neurology, so changed per pain management to butrans for the past month, and no further sleepwalking. Still sleeps with bipap. protryptilene cuases dry mouth, hard to swallow, then finds it hard to breathe. Past Medical History  Diagnosis Date  . ALLERGIC RHINITIS 01/20/2008  . ANEMIA-IRON DEFICIENCY 01/20/2008  . ANXIETY 01/20/2008  . ASTHMATIC BRONCHITIS, ACUTE 05/19/2008  . CENTRAL SLEEP APNEA CONDS CLASSIFIED ELSEWHERE 08/31/2010  . DEPRESSION 01/20/2008  . DYSPHAGIA UNSPECIFIED 12/06/2008  . ELEVATED BLOOD PRESSURE WITHOUT DIAGNOSIS OF HYPERTENSION 12/06/2008  . GERD 12/06/2008  . HYPERLIPIDEMIA 01/20/2008  . HYPERTENSION 12/16/2008  . OTITIS MEDIA, ACUTE, BILATERAL 12/16/2008  . OTITIS MEDIA, SEROUS, CHRONIC 12/30/2008  . PVD 05/12/2010  . SINUSITIS- ACUTE-NOS 09/21/2008  . SWELLING MASS OR LUMP IN HEAD AND NECK 09/21/2008  . TIA 12/16/2008  . Trigeminal neuralgia 01/20/2008  . Unspecified hearing loss 09/21/2008  . VENOUS INSUFFICIENCY, LEGS 05/03/2010  . THYROID NODULE, LEFT 01/07/2009   Past Surgical History    Procedure Date  . Abdominal hysterectomy   . Microvascular decompression      for chronic facial pain/ [facility]  . Oophorectomy   . Appendectomy   . Breast surgery     Left breast biopsy, Benign  . Abdominal exploration surgery 07/1986    Hernia repair-femoral    reports that she has never smoked. She does not have any smokeless tobacco history on file. She reports that she does not drink alcohol or use illicit drugs. family history includes Breast cancer in her paternal aunt; Diabetes in her father; Heart disease in her other and paternal grandmother; Hyperlipidemia in her mother; Hypertension in her mother; and Melanoma in her sister. Allergies  Allergen Reactions  . Sulfamethoxazole W/Trimethoprim    Current Outpatient Prescriptions on File Prior to Visit  Medication Sig Dispense Refill  . protriptyline (VIVACTIL) 10 MG tablet TAKE 4 TABLETS BY MOUTH EVERY MORNING  120 tablet  3    Review of Systems Review of Systems  Constitutional: Negative for diaphoresis, activity change, appetite change and unexpected weight change.  HENT: Negative for hearing loss, ear pain, facial swelling, mouth sores and neck stiffness.   Eyes: Negative for pain, redness and visual disturbance.  Respiratory: Negative for shortness of breath and wheezing.   Cardiovascular: Negative for chest pain and palpitations.  Gastrointestinal: Negative for diarrhea, blood in stool, abdominal distention and rectal pain.  Genitourinary: Negative for hematuria, flank pain and decreased urine volume.  Musculoskeletal: Negative for myalgias and joint swelling.  Skin: Negative for color change and wound.  Neurological: Negative for syncope and numbness.  Hematological: Negative for adenopathy.  Psychiatric/Behavioral: Negative for hallucinations, self-injury, decreased concentration and  agitation. ]    Objective:   Physical Exam BP 142/100  Pulse 103  Temp(Src) 98.8 F (37.1 C) (Oral)  Ht 5\' 7"  (1.702 m)  Wt 141  lb 6 oz (64.127 kg)  BMI 22.14 kg/m2  SpO2 97% Physical Exam  VS noted Constitutional: Pt is oriented to person, place, and time. Appears well-developed and well-nourished.  HENT:  Head: Normocephalic and atraumatic.  Right Ear: External ear normal.  Left Ear: External ear normal.  Nose: Nose normal.  Mouth/Throat: Oropharynx is clear and moist.  Eyes: Conjunctivae and EOM are normal. Pupils are equal, round, and reactive to light.  Neck: Normal range of motion. Neck supple. No JVD present. No tracheal deviation present.  Cardiovascular: Normal rate, regular rhythm, normal heart sounds and intact distal pulses.   Pulmonary/Chest: Effort normal and breath sounds normal.  Abdominal: Soft. Bowel sounds are normal. There is no tenderness.  Musculoskeletal: Normal range of motion. Exhibits no edema.  Lymphadenopathy:  Has no cervical adenopathy.  Neurological: Pt is alert and oriented to person, place, and time. Pt has normal reflexes. No cranial nerve deficit.  Skin: Skin is warm and dry. No rash noted.  Psychiatric:  Has  normal mood and affect. Behavior is normal.         Assessment & Plan:

## 2011-07-05 NOTE — Assessment & Plan Note (Signed)
Worse now off the fentanyl  - for klonopin bid prn

## 2011-07-05 NOTE — Patient Instructions (Addendum)
You had the flu shot today Take all new medications as prescribed - the klonopin Continue all other medications as before Please keep your appointments with your specialists as you have planned - dermatology every 6 mo Please return in 1 year for your yearly visit, or sooner if needed, with Lab testing done 3-5 days before

## 2011-07-07 ENCOUNTER — Encounter: Payer: Self-pay | Admitting: Internal Medicine

## 2011-07-13 ENCOUNTER — Ambulatory Visit
Admission: RE | Admit: 2011-07-13 | Discharge: 2011-07-13 | Disposition: A | Discharge status: Home / Self Care | Payer: PRIVATE HEALTH INSURANCE | Source: Ambulatory Visit | Attending: Internal Medicine | Admitting: Internal Medicine

## 2011-07-13 DIAGNOSIS — Z1231 Encounter for screening mammogram for malignant neoplasm of breast: Secondary | ICD-10-CM

## 2011-08-17 ENCOUNTER — Encounter: Payer: Self-pay | Admitting: Internal Medicine

## 2011-08-17 ENCOUNTER — Ambulatory Visit (INDEPENDENT_AMBULATORY_CARE_PROVIDER_SITE_OTHER): Payer: PRIVATE HEALTH INSURANCE | Admitting: Internal Medicine

## 2011-08-17 VITALS — BP 140/90 | HR 103 | Temp 98.7°F | Resp 16 | Ht 67.0 in | Wt 140.0 lb

## 2011-08-17 DIAGNOSIS — J309 Allergic rhinitis, unspecified: Secondary | ICD-10-CM

## 2011-08-17 DIAGNOSIS — J019 Acute sinusitis, unspecified: Secondary | ICD-10-CM | POA: Insufficient documentation

## 2011-08-17 DIAGNOSIS — I10 Essential (primary) hypertension: Secondary | ICD-10-CM

## 2011-08-17 MED ORDER — LEVOFLOXACIN 250 MG PO TABS: 250.0000 mg | ORAL_TABLET | Freq: Every day | ORAL | Status: AC

## 2011-08-17 NOTE — Patient Instructions (Addendum)
Take all new medications as prescribed Continue all other medications as before You can also take Delsym OTC for cough, and/or Mucinex (or it's generic off brand) for congestion  

## 2011-08-18 ENCOUNTER — Encounter: Payer: Self-pay | Admitting: Internal Medicine

## 2011-08-18 NOTE — Assessment & Plan Note (Signed)
Mild to mod, for antibx course,  to f/u any worsening symptoms or concerns 

## 2011-08-18 NOTE — Assessment & Plan Note (Signed)
stable overall by hx and exam, and pt to continue medical treatment as before, oko for OTC allegra otc prn

## 2011-08-18 NOTE — Assessment & Plan Note (Signed)
stable overall by hx and exam, most recent data reviewed with pt, and pt to continue medical treatment as before  Lab Results  Component Value Date   WBC 5.3 07/03/2011   HGB 13.4 07/03/2011   HCT 40.4 07/03/2011   PLT 291.0 07/03/2011   GLUCOSE 94 07/03/2011   CHOL 208* 07/03/2011   TRIG 47.0 07/03/2011   HDL 76.90 07/03/2011   LDLDIRECT 123.1 07/03/2011   LDLCALC 92 10/21/2009   ALT 18 07/03/2011   AST 17 07/03/2011   NA 141 07/03/2011   K 4.7 07/03/2011   CL 106 07/03/2011   CREATININE 0.7 07/03/2011   BUN 9 07/03/2011   CO2 30 07/03/2011   TSH 0.61 07/03/2011

## 2011-08-18 NOTE — Progress Notes (Signed)
Subjective:    Patient ID: Sheryl Lester, female    DOB: [birth year], 55 y.o.   MRN: [id number]  HPI   Here with 3 days acute onset fever, facial pain, pressure, general weakness and malaise, and greenish d/c, with slight ST, but little to no cough and Pt denies chest pain, increased sob or doe, wheezing, orthopnea, PND, increased LE swelling, palpitations, dizziness or syncope.  Does have several wks ongoing nasal allergy symptoms with clear congestion, itch and sneeze, without fever, pain, ST, cough or wheezing.  Denies worsening depressive symptoms, suicidal ideation, or panic, though has ongoing anxiety.  Pt denies fever, wt loss, night sweats, loss of appetite, or other constitutional symptoms except for the above Past Medical History  Diagnosis Date  . ALLERGIC RHINITIS 01/20/2008  . ANEMIA-IRON DEFICIENCY 01/20/2008  . ANXIETY 01/20/2008  . ASTHMATIC BRONCHITIS, ACUTE 05/19/2008  . CENTRAL SLEEP APNEA CONDS CLASSIFIED ELSEWHERE 08/31/2010  . DEPRESSION 01/20/2008  . DYSPHAGIA UNSPECIFIED 12/06/2008  . ELEVATED BLOOD PRESSURE WITHOUT DIAGNOSIS OF HYPERTENSION 12/06/2008  . GERD 12/06/2008  . HYPERLIPIDEMIA 01/20/2008  . HYPERTENSION 12/16/2008  . OTITIS MEDIA, ACUTE, BILATERAL 12/16/2008  . OTITIS MEDIA, SEROUS, CHRONIC 12/30/2008  . PVD 05/12/2010  . SINUSITIS- ACUTE-NOS 09/21/2008  . SWELLING MASS OR LUMP IN HEAD AND NECK 09/21/2008  . TIA 12/16/2008  . Trigeminal neuralgia 01/20/2008  . Unspecified hearing loss 09/21/2008  . VENOUS INSUFFICIENCY, LEGS 05/03/2010  . THYROID NODULE, LEFT 01/07/2009   Past Surgical History  Procedure Date  . Abdominal hysterectomy   . Microvascular decompression      for chronic facial pain/ [facility]  . Oophorectomy   . Appendectomy   . Breast surgery     Left breast biopsy, Benign  . Abdominal exploration surgery 07/1986    Hernia repair-femoral    reports that she has never smoked. She does not have any smokeless tobacco history on file. She reports that she  does not drink alcohol or use illicit drugs. family history includes Breast cancer in her paternal aunt; Diabetes in her father; Heart disease in her other and paternal grandmother; Hyperlipidemia in her mother; Hypertension in her mother; and Melanoma in her sister. Allergies  Allergen Reactions  . Sulfamethoxazole W/Trimethoprim    Current Outpatient Prescriptions on File Prior to Visit  Medication Sig Dispense Refill  . Buprenorphine (BUTRANS) 20 MCG/HR PTWK Place onto the skin.        . clonazePAM (KLONOPIN) 0.5 MG tablet Take 1 tablet (0.5 mg total) by mouth 2 (two) times daily. As needed  60 tablet  2  . protriptyline (VIVACTIL) 10 MG tablet TAKE 4 TABLETS BY MOUTH EVERY MORNING  120 tablet  3  . tapentadol (NUCYNTA) 50 MG TABS Take by mouth every 4 (four) hours as needed.         Review of Systems Review of Systems  Constitutional: Negative for diaphoresis and unexpected weight change.  HENT: Negative for drooling and tinnitus.   Eyes: Negative for photophobia and visual disturbance.  Respiratory: Negative for choking and stridor.   Gastrointestinal: Negative for vomiting and blood in stool.  Genitourinary: Negative for hematuria and decreased urine volume.      Objective:   Physical Exam BP 140/90  Pulse 103  Temp(Src) 98.7 F (37.1 C) (Oral)  Resp 16  Ht 5\' 7"  (1.702 m)  Wt 140 lb (63.504 kg)  BMI 21.93 kg/m2  SpO2 98% Physical Exam  VS noted, mild ill Constitutional: Pt appears well-developed and well-nourished.  HENT:  Head: Normocephalic.  Right Ear: External ear normal.  Left Ear: External ear normal.  Bilat tm's mild erythema.  Sinus tender bilat.  Pharynx mild erythema Eyes: Conjunctivae and EOM are normal. Pupils are equal, round, and reactive to light.  Neck: Normal range of motion. Neck supple.  Cardiovascular: Normal rate and regular rhythm.   Pulmonary/Chest: Effort normal and breath sounds normal.  Neurological: Pt is alert. No cranial nerve deficit.    Skin: Skin is warm. No erythema.  Psychiatric: Pt behavior is normal. Thought content normal. 1+nervous    Assessment & Plan:

## 2011-10-01 DIAGNOSIS — G4750 Parasomnia, unspecified: Secondary | ICD-10-CM | POA: Insufficient documentation

## 2011-10-03 ENCOUNTER — Other Ambulatory Visit: Payer: Self-pay | Admitting: Internal Medicine

## 2011-11-14 DIAGNOSIS — N3010 Interstitial cystitis (chronic) without hematuria: Secondary | ICD-10-CM | POA: Insufficient documentation

## 2011-11-23 DIAGNOSIS — IMO0001 Reserved for inherently not codable concepts without codable children: Secondary | ICD-10-CM | POA: Insufficient documentation

## 2011-12-18 DIAGNOSIS — R413 Other amnesia: Secondary | ICD-10-CM

## 2011-12-26 DIAGNOSIS — R413 Other amnesia: Secondary | ICD-10-CM

## 2012-01-02 DIAGNOSIS — R413 Other amnesia: Secondary | ICD-10-CM

## 2012-01-04 ENCOUNTER — Other Ambulatory Visit: Payer: Self-pay

## 2012-01-04 DIAGNOSIS — F411 Generalized anxiety disorder: Secondary | ICD-10-CM

## 2012-01-04 MED ORDER — CLONAZEPAM 0.5 MG PO TABS: 0.5000 mg | ORAL_TABLET | Freq: Two times a day (BID) | ORAL | Status: DC

## 2012-01-04 NOTE — Telephone Encounter (Signed)
Rx faxed to pharmacy. sue 

## 2012-01-04 NOTE — Telephone Encounter (Signed)
Done hardcopy to robin  

## 2012-01-25 ENCOUNTER — Other Ambulatory Visit: Payer: Self-pay | Admitting: Neurology

## 2012-01-25 DIAGNOSIS — G521 Disorders of glossopharyngeal nerve: Secondary | ICD-10-CM

## 2012-01-25 DIAGNOSIS — G500 Trigeminal neuralgia: Secondary | ICD-10-CM

## 2012-01-30 ENCOUNTER — Ambulatory Visit
Admission: RE | Admit: 2012-01-30 | Discharge: 2012-01-30 | Disposition: A | Discharge status: Home / Self Care | Payer: PRIVATE HEALTH INSURANCE | Source: Ambulatory Visit | Attending: Neurology | Admitting: Neurology

## 2012-01-30 DIAGNOSIS — G521 Disorders of glossopharyngeal nerve: Secondary | ICD-10-CM

## 2012-01-30 DIAGNOSIS — G500 Trigeminal neuralgia: Secondary | ICD-10-CM

## 2012-01-30 MED ORDER — GADOBENATE DIMEGLUMINE 529 MG/ML IV SOLN
12.0000 mL | Freq: Once | INTRAVENOUS | Status: AC | PRN
  Administered 2012-01-30: 12 mL via INTRAVENOUS

## 2012-02-03 ENCOUNTER — Other Ambulatory Visit: Payer: Self-pay | Admitting: Internal Medicine

## 2012-02-04 NOTE — Telephone Encounter (Signed)
Done erx 

## 2012-02-04 NOTE — Telephone Encounter (Signed)
Protriptyline request [last refill 02.07.13 #120x3]/SLS Please advise.

## 2012-02-09 ENCOUNTER — Encounter: Payer: Self-pay | Admitting: Family Medicine

## 2012-02-09 ENCOUNTER — Ambulatory Visit (INDEPENDENT_AMBULATORY_CARE_PROVIDER_SITE_OTHER): Payer: PRIVATE HEALTH INSURANCE | Admitting: Family Medicine

## 2012-02-09 VITALS — BP 158/94 | HR 104 | Temp 98.4°F | Wt 137.0 lb

## 2012-02-09 DIAGNOSIS — G500 Trigeminal neuralgia: Secondary | ICD-10-CM

## 2012-02-09 NOTE — Progress Notes (Signed)
Long standing facial pain.  R sided facial pain.  For last month, she's had bilateral pain.  Inside of mouth is burning and painful, bilaterally.  She has internal mouth and external facial pain, bilaterally.  Now her L hand is numb.  She had seen Dr. Vickey Huger with neuro and MRI was done but I don't have results to review.    Sees pain clinic in [facility] and neuro locally.  She has a pain contract with pain clinic.    "I'm worried that I have cancer."  Meds, vitals, and allergies reviewed.   ROS: See HPI.  Otherwise, noncontributory.  Nad but complains of pain during the exam.  Tm wnl  Nasal and op exam wnl Neck supple Rrr, slightly tachy ctab

## 2012-02-09 NOTE — Assessment & Plan Note (Signed)
Apparently with chronic pain syndrome.  I didn't change her meds. I don't have MRI report.  I would ask that PCP discuss recent events and plan with patient and help patient get MRI report.  I have nothing else to offer patient today.  She understood.

## 2012-02-09 NOTE — Patient Instructions (Addendum)
I'll notify your primary doctor.  Take care.

## 2012-02-11 ENCOUNTER — Telehealth: Payer: Self-pay | Admitting: Internal Medicine

## 2012-02-11 ENCOUNTER — Ambulatory Visit (INDEPENDENT_AMBULATORY_CARE_PROVIDER_SITE_OTHER): Payer: PRIVATE HEALTH INSURANCE | Admitting: Internal Medicine

## 2012-02-11 ENCOUNTER — Encounter: Payer: Self-pay | Admitting: Internal Medicine

## 2012-02-11 VITALS — BP 150/110 | HR 113 | Temp 98.5°F | Ht 67.0 in | Wt 138.2 lb

## 2012-02-11 DIAGNOSIS — F411 Generalized anxiety disorder: Secondary | ICD-10-CM

## 2012-02-11 DIAGNOSIS — G500 Trigeminal neuralgia: Secondary | ICD-10-CM

## 2012-02-11 DIAGNOSIS — I10 Essential (primary) hypertension: Secondary | ICD-10-CM

## 2012-02-11 MED ORDER — OXYCODONE HCL 10 MG PO TABS: ORAL_TABLET | ORAL | Status: DC

## 2012-02-11 MED ORDER — KETOROLAC TROMETHAMINE 30 MG/ML IJ SOLN
30.0000 mg | Freq: Once | INTRAMUSCULAR | Status: AC
  Administered 2012-02-11: 30 mg via INTRAMUSCULAR

## 2012-02-11 NOTE — Progress Notes (Signed)
Subjective:    Patient ID: Sheryl Lester, female    DOB: 1955-12-18, 56 y.o.   MRN: [id number]  HPI  Here in f/u for atypical facial pain;  Saw [facility] 2 wks ago, gave 5 mcg increase in the butrans.  Still with severe pain, begging to be hospd, very anxious, "cannot take it anymore".  Denies worsening depressive symptoms, suicidal ideation, or panic, though has ongoing anxiety.  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  Pt did go what sounds like an information session at  [facility] may, and since then pain has been worse.  Desparate today to be referred there asap.  Is s/p microvascular decompression approx 13 yrs ago Past Medical History  Diagnosis Date  . ALLERGIC RHINITIS 01/20/2008  . ANEMIA-IRON DEFICIENCY 01/20/2008  . ANXIETY 01/20/2008  . ASTHMATIC BRONCHITIS, ACUTE 05/19/2008  . CENTRAL SLEEP APNEA CONDS CLASSIFIED ELSEWHERE 08/31/2010  . DEPRESSION 01/20/2008  . DYSPHAGIA UNSPECIFIED 12/06/2008  . ELEVATED BLOOD PRESSURE WITHOUT DIAGNOSIS OF HYPERTENSION 12/06/2008  . GERD 12/06/2008  . HYPERLIPIDEMIA 01/20/2008  . HYPERTENSION 12/16/2008  . OTITIS MEDIA, ACUTE, BILATERAL 12/16/2008  . OTITIS MEDIA, SEROUS, CHRONIC 12/30/2008  . PVD 05/12/2010  . SINUSITIS- ACUTE-NOS 09/21/2008  . SWELLING MASS OR LUMP IN HEAD AND NECK 09/21/2008  . TIA 12/16/2008  . Trigeminal neuralgia 01/20/2008  . Unspecified hearing loss 09/21/2008  . VENOUS INSUFFICIENCY, LEGS 05/03/2010  . THYROID NODULE, LEFT 01/07/2009   Past Surgical History  Procedure Date  . Abdominal hysterectomy   . Microvascular decompression      for chronic facial pain/ [facility]  . Oophorectomy   . Appendectomy   . Breast surgery     Left breast biopsy, Benign  . Abdominal exploration surgery 07/1986    Hernia repair-femoral    reports that she has never smoked. She does not have any smokeless tobacco history on  file. She reports that she does not drink alcohol or use illicit drugs. family history includes Breast cancer in her paternal aunt; Diabetes in her father; Heart disease in her other and paternal grandmother; Hyperlipidemia in her mother; Hypertension in her mother; and Melanoma in her sister. Allergies  Allergen Reactions  . Sulfamethoxazole W-Trimethoprim    Current Outpatient Prescriptions on File Prior to Visit  Medication Sig Dispense Refill  . Armodafinil (NUVIGIL) 250 MG tablet Take 250 mg by mouth daily.      . Buprenorphine (BUTRANS) 20 MCG/HR PTWK Place onto the skin.        . clonazePAM (KLONOPIN) 0.5 MG tablet Take 1 tablet (0.5 mg total) by mouth 2 (two) times daily. As needed  60 tablet  2  . protriptyline (VIVACTIL) 10 MG tablet TAKE 4 TABLETS BY MOUTH EVERY MORNING  120 tablet  3  . tapentadol (NUCYNTA) 50 MG TABS Take by mouth every 4 (four) hours as needed.         Review of Systems All otherwise neg per pt     Objective:   Physical Exam BP 150/110  Pulse 113  Temp 98.5 F (36.9 C) (Oral)  Ht 5\' 7"  (1.702 m)  Wt 138 lb 4 oz (62.71 kg)  BMI 21.65 kg/m2  SpO2 94% Physical Exam  VS noted Constitutional: Pt appears well-developed and well-nourished.  HENT: Head: Normocephalic.  Right Ear: External ear normal.  Left Ear: External ear normal.  Eyes: Conjunctivae  and EOM are normal. Pupils are equal, round, and reactive to light.  Neck: Normal range of motion. Neck supple.  Cardiovascular: Normal rate and regular rhythm.   Pulmonary/Chest: Effort normal and breath sounds normal.  Neurological: Pt is alert. No cranial nerve deficit.  Motor/sens/dtr intact  Skin: Skin is warm. No erythema.  Psychiatric: 2+ nervous    Assessment & Plan:

## 2012-02-11 NOTE — Assessment & Plan Note (Addendum)
D/w pt, does not meed criteria for hospn at this time, tried to reassure her;  For toradol 30 mg IM now, cont the butrans, ok for oxycodone for acute on chronic pain with note to her pain clinic to explain, and f/u with pain clinic as she does, and referred per pt request to [facility] neurosurgeon for evaluation

## 2012-02-11 NOTE — Patient Instructions (Addendum)
You had the toradol 30 mg shot today Take all new medications as prescribed Continue all other medications as before You will be contacted regarding the referral for: Neurosurgury in [place]

## 2012-02-11 NOTE — Telephone Encounter (Signed)
Caller: Richard/Spouse; PCP: Oliver Barre; CB#: [phone]; Call regarding Atypical Facial Pain That Has Been Ongoing for 10 Years, But Now It Has Moved To the Other Side of Her Face and Has Numbness. Patient Was Seen in the Office Saturday. (sees and Neurologist and A. Pain Doctor);   Pain worse than it has been in 13-14 years; now on both sides of the face ; reports it is like electricity in her face.  Caller states, "I want to make sure Dr. Jonny Ruiz is aware of this."  Pain/ pressure in left temple.  Emergent sx ruled out.  Appointment with Dr. Jonny Ruiz at 0900 per Face Pain or Swelling protocol.   Scheduled for 30 min d/t complexity.

## 2012-02-17 ENCOUNTER — Encounter: Payer: Self-pay | Admitting: Internal Medicine

## 2012-02-17 NOTE — Assessment & Plan Note (Signed)
Elevated liekly situational today, stable overall by hx and exam, most recent data reviewed with pt, and pt to continue medical treatment as before BP Readings from Last 3 Encounters:  02/11/12 150/110  02/09/12 158/94  08/17/11 140/90

## 2012-02-17 NOTE — Assessment & Plan Note (Signed)
Worse today situational, declines need for counseling or further tx today,  to f/u any worsening symptoms or concerns

## 2012-02-26 ENCOUNTER — Telehealth: Payer: Self-pay

## 2012-02-26 NOTE — Telephone Encounter (Signed)
?   Allergic rxn - somewhat suggestive of angioedema possibly though hard to tell without exam;  Ok for benadryl 50 mg otc q 6 hrs prn, consider OV if worsening pain, fever, redness, swelling, numbness or weakness

## 2012-02-26 NOTE — Telephone Encounter (Signed)
Patient informed of MD's instructions and agreed to all.

## 2012-02-26 NOTE — Telephone Encounter (Signed)
[facility] Triage Call Report Triage Record Num: [id number] Operator: Maryfrances Bunnell Patient Name: Sheryl Lester Call Date & Time: 02/25/2012 10:52:32PM Patient Phone: PCP: Oliver Barre Patient Gender: Female PCP Fax : [phone] Patient DOB: [birth year] Practice Name: [facility] Reason for Call: Caller: [name]/Patient; PCP: Oliver Barre; CB#: [phone]; Call regarding: Tightness in hands: Both hands feel tight, puffy, with tingling sensation. States color from elbows to hands appears to be more pink tonight. Per Hand Non-Injury Protocol all emergent symtpoms ruled out with exception of "Gradual onset or worsening of numbness or weakness." Disposition is See Provider within 72hrs. Patient has appt 02/27/12 with Neurologist. Care advice given. Protocol(s) Used: Hand Non-Injury Recommended Outcome per Protocol: See Provider within 72 Hours Reason for Outcome: Gradual onset or worsening of numbness or weakness Care Advice: ~ Remove any rings on the fingers of the affected hand, if possible. ~ SYMPTOM / CONDITION MANAGEMENT 02/25/2012 11:08:08PM Page 1 of 1 [id number]

## 2012-02-26 NOTE — Telephone Encounter (Signed)
Called left message to call back 

## 2012-03-25 ENCOUNTER — Telehealth: Payer: Self-pay

## 2012-03-25 NOTE — Telephone Encounter (Signed)
A user error has taken place: encounter opened in error, closed for administrative reasons.

## 2012-04-29 ENCOUNTER — Ambulatory Visit (INDEPENDENT_AMBULATORY_CARE_PROVIDER_SITE_OTHER): Payer: PRIVATE HEALTH INSURANCE | Admitting: Internal Medicine

## 2012-04-29 ENCOUNTER — Encounter: Payer: Self-pay | Admitting: Internal Medicine

## 2012-04-29 VITALS — BP 160/100 | HR 96 | Temp 98.0°F | Ht 67.0 in | Wt 135.8 lb

## 2012-04-29 DIAGNOSIS — G459 Transient cerebral ischemic attack, unspecified: Secondary | ICD-10-CM

## 2012-04-29 DIAGNOSIS — G500 Trigeminal neuralgia: Secondary | ICD-10-CM

## 2012-04-29 DIAGNOSIS — R208 Other disturbances of skin sensation: Secondary | ICD-10-CM | POA: Insufficient documentation

## 2012-04-29 DIAGNOSIS — K146 Glossodynia: Secondary | ICD-10-CM | POA: Insufficient documentation

## 2012-04-29 DIAGNOSIS — F411 Generalized anxiety disorder: Secondary | ICD-10-CM

## 2012-04-29 DIAGNOSIS — I10 Essential (primary) hypertension: Secondary | ICD-10-CM

## 2012-04-29 DIAGNOSIS — K1370 Unspecified lesions of oral mucosa: Secondary | ICD-10-CM

## 2012-04-29 MED ORDER — CLONAZEPAM 0.5 MG PO TABS: 0.5000 mg | ORAL_TABLET | Freq: Two times a day (BID) | ORAL | Status: DC

## 2012-04-29 MED ORDER — KETOROLAC TROMETHAMINE 30 MG/ML IJ SOLN
30.0000 mg | Freq: Once | INTRAMUSCULAR | Status: AC
  Administered 2012-04-29: 30 mg via INTRAMUSCULAR

## 2012-04-29 MED ORDER — IRBESARTAN 150 MG PO TABS: 150.0000 mg | ORAL_TABLET | Freq: Every day | ORAL | Status: DC

## 2012-04-29 NOTE — Patient Instructions (Addendum)
You will be contacted regarding the referral for: carotid ultrasound Take all new medications as prescribed - the generic for avapro 150 mg per day Continue all other medications as before Please return in 2 mo with Lab testing done 3-5 days before

## 2012-04-29 NOTE — Assessment & Plan Note (Signed)
Ok for carotid dopplers, o/w stable overall by hx and exam,  and pt to continue medical treatment as before

## 2012-04-29 NOTE — Assessment & Plan Note (Signed)
Mild uncontrolled, for avapro 150,  to f/u any worsening symptoms or concerns

## 2012-04-29 NOTE — Assessment & Plan Note (Signed)
stable overall by hx and exam, most recent data reviewed with pt, and pt to continue medical treatment as before  - for med refill Lab Results  Component Value Date   WBC 5.3 07/03/2011   HGB 13.4 07/03/2011   HCT 40.4 07/03/2011   PLT 291.0 07/03/2011   GLUCOSE 94 07/03/2011   CHOL 208* 07/03/2011   TRIG 47.0 07/03/2011   HDL 76.90 07/03/2011   LDLDIRECT 123.1 07/03/2011   LDLCALC 92 10/21/2009   ALT 18 07/03/2011   AST 17 07/03/2011   NA 141 07/03/2011   K 4.7 07/03/2011   CL 106 07/03/2011   CREATININE 0.7 07/03/2011   BUN 9 07/03/2011   CO2 30 07/03/2011   TSH 0.61 07/03/2011

## 2012-04-29 NOTE — Assessment & Plan Note (Signed)
Etiology unclear, exam benign,  to f/u any worsening symptoms or concerns

## 2012-04-29 NOTE — Progress Notes (Signed)
Subjective:    Patient ID: Sheryl Lester, female    DOB: [birth year], 56 y.o.   MRN: [id number]  HPI  Here to f/u, having signficiant marital difficulties with increased stress recent, c/o ongoing TG facial pain, but also burning mouth pain for at least 6 months, mentioned to neuro in may 2013, persistent, mild and occas severe, nothing makes better or worse. Pt denies chest pain, increased sob or doe, wheezing, orthopnea, PND, increased LE swelling, palpitations, dizziness or syncope.  Pt denies new neurological symptoms such as new headache, or facial or extremity weakness or numbness  Pt denies polydipsia, polyuria.   Pt denies fever, wt loss, night sweats, loss of appetite, or other constitutional symptoms. Has hx of ? TIA< pt asks for carotid dopplers, not done to date Past Medical History  Diagnosis Date  . ALLERGIC RHINITIS 01/20/2008  . ANEMIA-IRON DEFICIENCY 01/20/2008  . ANXIETY 01/20/2008  . ASTHMATIC BRONCHITIS, ACUTE 05/19/2008  . CENTRAL SLEEP APNEA CONDS CLASSIFIED ELSEWHERE 08/31/2010  . DEPRESSION 01/20/2008  . DYSPHAGIA UNSPECIFIED 12/06/2008  . ELEVATED BLOOD PRESSURE WITHOUT DIAGNOSIS OF HYPERTENSION 12/06/2008  . GERD 12/06/2008  . HYPERLIPIDEMIA 01/20/2008  . HYPERTENSION 12/16/2008  . OTITIS MEDIA, ACUTE, BILATERAL 12/16/2008  . OTITIS MEDIA, SEROUS, CHRONIC 12/30/2008  . PVD 05/12/2010  . SINUSITIS- ACUTE-NOS 09/21/2008  . SWELLING MASS OR LUMP IN HEAD AND NECK 09/21/2008  . TIA 12/16/2008  . Trigeminal neuralgia 01/20/2008  . Unspecified hearing loss 09/21/2008  . VENOUS INSUFFICIENCY, LEGS 05/03/2010  . THYROID NODULE, LEFT 01/07/2009   Past Surgical History  Procedure Date  . Abdominal hysterectomy   . Microvascular decompression      for chronic facial pain/ [facility]  . Oophorectomy   . Appendectomy   . Breast surgery     Left breast biopsy, Benign  . Abdominal exploration surgery 07/1986    Hernia repair-femoral    reports that she has never smoked. She does not have any  smokeless tobacco history on file. She reports that she does not drink alcohol or use illicit drugs. family history includes Breast cancer in her paternal aunt; Diabetes in her father; Heart disease in her other and paternal grandmother; Hyperlipidemia in her mother; Hypertension in her mother; and Melanoma in her sister. Allergies  Allergen Reactions  . Sulfamethoxazole W-Trimethoprim    Current Outpatient Prescriptions on File Prior to Visit  Medication Sig Dispense Refill  . Armodafinil (NUVIGIL) 250 MG tablet Take 250 mg by mouth daily.      . Buprenorphine (BUTRANS) 20 MCG/HR PTWK Place onto the skin.        . Gabapentin, PHN, (GRALISE PO) Take by mouth daily.      . protriptyline (VIVACTIL) 10 MG tablet TAKE 4 TABLETS BY MOUTH EVERY MORNING  120 tablet  3  . tapentadol (NUCYNTA) 50 MG TABS Take by mouth every 4 (four) hours as needed.        Marland Kitchen DISCONTD: clonazePAM (KLONOPIN) 0.5 MG tablet Take 1 tablet (0.5 mg total) by mouth 2 (two) times daily. As needed  60 tablet  2  . irbesartan (AVAPRO) 150 MG tablet Take 1 tablet (150 mg total) by mouth daily.  90 tablet  3   No current facility-administered medications on file prior to visit.    Review of Systems Review of Systems  Constitutional: Negative for diaphoresis and unexpected weight change.  HENT: Negative for drooling and tinnitus.   Eyes: Negative for photophobia and visual disturbance.  Respiratory: Negative for choking and stridor.  Gastrointestinal: Negative for vomiting and blood in stool.  Genitourinary: Negative for hematuria and decreased urine volume.  Musculoskeletal: Negative for gait problem.  Skin: Negative for color change and wound.  Neurological: Negative for tremors    Objective:   Physical Exam BP 160/100  Pulse 96  Temp 98 F (36.7 C) (Oral)  Ht 5\' 7"  (1.702 m)  Wt 135 lb 12 oz (61.576 kg)  BMI 21.26 kg/m2  SpO2 96% Physical Exam  VS noted Constitutional: Pt appears well-developed and  well-nourished.  HENT: Head: Normocephalic.  Right Ear: External ear normal.  Left Ear: External ear normal.  Mouth; normal appearance, no rash, swelling, ulcer Eyes: Conjunctivae and EOM are normal. Pupils are equal, round, and reactive to light.  Neck: Normal range of motion. Neck supple.  Cardiovascular: Normal rate and regular rhythm.   Pulmonary/Chest: Effort normal and breath sounds normal.  Neurological: Pt is alert. Not confused.  Skin: Skin is warm. No erythema. No rash Psychiatric 2+ nervous, distressed, tearful, dysphoric affect    Assessment & Plan:

## 2012-04-29 NOTE — Assessment & Plan Note (Signed)
Long standing, has seen mult specialists, cont pain control, delicnes further eval at this time

## 2012-05-01 ENCOUNTER — Other Ambulatory Visit: Payer: Self-pay | Admitting: Cardiology

## 2012-05-01 DIAGNOSIS — G459 Transient cerebral ischemic attack, unspecified: Secondary | ICD-10-CM

## 2012-05-08 ENCOUNTER — Ambulatory Visit: Payer: PRIVATE HEALTH INSURANCE | Admitting: Internal Medicine

## 2012-05-26 ENCOUNTER — Encounter (INDEPENDENT_AMBULATORY_CARE_PROVIDER_SITE_OTHER): Payer: PRIVATE HEALTH INSURANCE

## 2012-05-26 DIAGNOSIS — G459 Transient cerebral ischemic attack, unspecified: Secondary | ICD-10-CM

## 2012-05-28 ENCOUNTER — Encounter: Payer: Self-pay | Admitting: Internal Medicine

## 2012-05-29 ENCOUNTER — Encounter: Payer: Self-pay | Admitting: Internal Medicine

## 2012-05-29 ENCOUNTER — Ambulatory Visit (INDEPENDENT_AMBULATORY_CARE_PROVIDER_SITE_OTHER): Payer: PRIVATE HEALTH INSURANCE | Admitting: Internal Medicine

## 2012-05-29 ENCOUNTER — Encounter (HOSPITAL_COMMUNITY): Payer: Self-pay | Admitting: *Deleted

## 2012-05-29 ENCOUNTER — Emergency Department (HOSPITAL_COMMUNITY)
Admission: EM | Admit: 2012-05-29 | Discharge: 2012-05-29 | Disposition: A | Discharge status: Home / Self Care | Payer: PRIVATE HEALTH INSURANCE | Source: Home / Self Care | Attending: Emergency Medicine | Admitting: Emergency Medicine

## 2012-05-29 ENCOUNTER — Emergency Department (INDEPENDENT_AMBULATORY_CARE_PROVIDER_SITE_OTHER): Payer: PRIVATE HEALTH INSURANCE

## 2012-05-29 VITALS — BP 88/62 | HR 99 | Temp 98.0°F | Ht 67.0 in | Wt 136.4 lb

## 2012-05-29 DIAGNOSIS — L02611 Cutaneous abscess of right foot: Secondary | ICD-10-CM | POA: Insufficient documentation

## 2012-05-29 DIAGNOSIS — I10 Essential (primary) hypertension: Secondary | ICD-10-CM

## 2012-05-29 DIAGNOSIS — L0390 Cellulitis, unspecified: Secondary | ICD-10-CM

## 2012-05-29 DIAGNOSIS — L03119 Cellulitis of unspecified part of limb: Secondary | ICD-10-CM

## 2012-05-29 DIAGNOSIS — F411 Generalized anxiety disorder: Secondary | ICD-10-CM

## 2012-05-29 DIAGNOSIS — L0291 Cutaneous abscess, unspecified: Secondary | ICD-10-CM

## 2012-05-29 DIAGNOSIS — R519 Headache, unspecified: Secondary | ICD-10-CM | POA: Insufficient documentation

## 2012-05-29 DIAGNOSIS — L02619 Cutaneous abscess of unspecified foot: Secondary | ICD-10-CM

## 2012-05-29 MED ORDER — CEFTRIAXONE SODIUM 1 G IJ SOLR
1.0000 g | Freq: Once | INTRAMUSCULAR | Status: AC
  Administered 2012-05-29: 1 g via INTRAMUSCULAR

## 2012-05-29 MED ORDER — MUPIROCIN 2 % EX OINT: TOPICAL_OINTMENT | Freq: Three times a day (TID) | CUTANEOUS | Status: DC

## 2012-05-29 MED ORDER — LIDOCAINE HCL (PF) 1 % IJ SOLN
INTRAMUSCULAR | Status: AC
  Filled 2012-05-29: qty 5

## 2012-05-29 MED ORDER — DOXYCYCLINE HYCLATE 100 MG PO TABS: 100.0000 mg | ORAL_TABLET | Freq: Two times a day (BID) | ORAL | Status: DC

## 2012-05-29 MED ORDER — CEFTRIAXONE SODIUM 1 G IJ SOLR
INTRAMUSCULAR | Status: AC
  Filled 2012-05-29: qty 10

## 2012-05-29 NOTE — Progress Notes (Signed)
Subjective:    Patient ID: Sheryl Lester, female    DOB: 12/17/1955, 56 y.o.   MRN: [id number]  HPI  Here with c/o right foot and ankle pain/red/swelling rather rapidly progressive now with severe pain/limps to walk/tearful since last PM,  D/w her physician sister who advised medical care, pt came here as urgent, Pt trying not to go to ER, does not want to be admitted if possible.  No drainage, red streak or groin pain/swelling, dizziness, chills or orthostasis. Has had somewhat decreased appetite and po intake for 2 days.  All started after minor trauma with scratch to foot from a tree branch on the ground, bleeding minor, stopped quickly, did not seem important at the time.  Currently on Nucynta for pain, not necessarily looking for more pain med today.  Has no known direct exposure to to MRSA but has been visiting a patient at the hospital recently where she was required to gown to enter the room Past Medical History  Diagnosis Date  . ALLERGIC RHINITIS 01/20/2008  . ANEMIA-IRON DEFICIENCY 01/20/2008  . ANXIETY 01/20/2008  . ASTHMATIC BRONCHITIS, ACUTE 05/19/2008  . CENTRAL SLEEP APNEA CONDS CLASSIFIED ELSEWHERE 08/31/2010  . DEPRESSION 01/20/2008  . DYSPHAGIA UNSPECIFIED 12/06/2008  . ELEVATED BLOOD PRESSURE WITHOUT DIAGNOSIS OF HYPERTENSION 12/06/2008  . GERD 12/06/2008  . HYPERLIPIDEMIA 01/20/2008  . HYPERTENSION 12/16/2008  . OTITIS MEDIA, ACUTE, BILATERAL 12/16/2008  . OTITIS MEDIA, SEROUS, CHRONIC 12/30/2008  . PVD 05/12/2010  . SINUSITIS- ACUTE-NOS 09/21/2008  . SWELLING MASS OR LUMP IN HEAD AND NECK 09/21/2008  . TIA 12/16/2008  . Trigeminal neuralgia 01/20/2008  . Unspecified hearing loss 09/21/2008  . VENOUS INSUFFICIENCY, LEGS 05/03/2010  . THYROID NODULE, LEFT 01/07/2009   Past Surgical History  Procedure Date  . Abdominal hysterectomy   . Microvascular decompression      for chronic facial pain/ [facility]  . Oophorectomy   . Appendectomy   . Breast surgery     Left breast biopsy, Benign    . Abdominal exploration surgery 07/1986    Hernia repair-femoral    reports that she has never smoked. She does not have any smokeless tobacco history on file. She reports that she does not drink alcohol or use illicit drugs. family history includes Breast cancer in her paternal aunt; Diabetes in her father; Heart disease in her other and paternal grandmother; Hyperlipidemia in her mother; Hypertension in her mother; and Melanoma in her sister. Allergies  Allergen Reactions  . Sulfamethoxazole W-Trimethoprim    Current Outpatient Prescriptions on File Prior to Visit  Medication Sig Dispense Refill  . Armodafinil (NUVIGIL) 250 MG tablet Take 250 mg by mouth daily.      . Buprenorphine (BUTRANS) 20 MCG/HR PTWK Place onto the skin.        . clonazePAM (KLONOPIN) 0.5 MG tablet Take 1 tablet (0.5 mg total) by mouth 2 (two) times daily. As needed  60 tablet  2  . Gabapentin, PHN, (GRALISE PO) Take by mouth daily.      . irbesartan (AVAPRO) 150 MG tablet Take 1 tablet (150 mg total) by mouth daily.  90 tablet  3  . protriptyline (VIVACTIL) 10 MG tablet TAKE 4 TABLETS BY MOUTH EVERY MORNING  120 tablet  3  . tapentadol (NUCYNTA) 50 MG TABS Take by mouth every 4 (four) hours as needed.         Review of Systems  Constitutional: Negative for diaphoresis and unexpected weight change.  HENT: Negative for tinnitus.  Eyes: Negative for photophobia and visual disturbance.  Respiratory: Negative for choking and stridor.   Gastrointestinal: Negative for vomiting and blood in stool.  Genitourinary: Negative for hematuria and decreased urine volume.  Musculoskeletal: Negative for gait problem.  Neurological: Negative for tremors and numbness.  Psychiatric/Behavioral: Negative for decreased concentration. The patient is not hyperactive.       Objective:   Physical Exam BP 88/62  Pulse 99  Temp 98 F (36.7 C) (Oral)  Ht 5\' 7"  (1.702 m)  Wt 136 lb 6 oz (61.859 kg)  BMI 21.36 kg/m2  SpO2  97% Physical Exam  VS noted Constitutional: Pt appears well-developed and well-nourished. but thin HENT: Head: Normocephalic.  Right Ear: External ear normal.  Left Ear: External ear normal.  Eyes: Conjunctivae and EOM are normal. Pupils are equal, round, and reactive to light.  Neck: Normal range of motion. Neck supple.  Cardiovascular: Normal rate and regular rhythm.   Pulmonary/Chest: Effort normal and breath sounds normal.  Neurological: Pt is alert. Not confused  Right foot with severe red/tender/pain worst medially at the arch but involving the dorsum of the foot to near the ankle, no red streaks, ? Arch fluctuance but no drainage Psychiatric: . Thought content normal. 2+ nervous, tearful     Assessment & Plan:

## 2012-05-29 NOTE — ED Provider Notes (Signed)
Chief Complaint  Patient presents with  . Cellulitis    History of Present Illness:   Sheryl Lester is a 56 year old female who was out in her yard 3 days ago in sandals when she sustained a small puncture wound to the medial aspect of the right foot. This initially appeared to be very mild and superficial. It then began to become erythematous and swollen. Erythema has extended over the entire dorsum of the foot and 2 yesterday and today has extended up above the ankle. She denies any red streaks running up the leg and she's had no fever or chills. The area is painful to touch and also with weightbearing. Her last tetanus vaccine was 3 years ago.  Review of Systems:  Other than noted above, the patient denies any of the following symptoms: Systemic:  No fever, chills, sweats, weight loss, or fatigue. ENT:  No nasal congestion, rhinorrhea, sore throat, swelling of lips, tongue or throat. Resp:  No cough, wheezing, or shortness of breath. Skin:  No rash, itching, nodules, or suspicious lesions.  PMFSH:  Past medical history, family history, social history, meds, and allergies were reviewed.  Physical Exam:   Vital signs:  BP 146/95  Pulse 96  Temp 98.5 F (36.9 C) (Oral)  Resp 19  SpO2 98% Gen:  Alert, oriented, in no distress. ENT:  Pharynx clear, no intraoral lesions, moist mucous membranes. Lungs:  Clear to auscultation. Skin:  She has a small, crusted abrasion over the dorsum of the medial aspect of the right foot. There is erythema, swelling, heat, and tenderness extending up over the entire dorsum of the foot up above the medial and lateral malleolar as pictured below. Pulses are full, she is able to move all of her joints well, and there is no decrease in sensation.    Assessment:  The encounter diagnosis was Cellulitis.  Plan:   1.  The following meds were prescribed:   New Prescriptions   DOXYCYCLINE (VIBRA-TABS) 100 MG TABLET    Take 1 tablet (100 mg total) by mouth 2  (two) times daily.   MUPIROCIN OINTMENT (BACTROBAN) 2 %    Apply topically 3 (three) times daily.   2.  The patient was instructed in symptomatic care and handouts were given. 3.  The patient was told to return if becoming worse in any way, if no better in 3 or 4 days, and given some red flag symptoms that would indicate earlier return.     Reuben Likes, MD 05/29/12 2203

## 2012-05-29 NOTE — Patient Instructions (Addendum)
Take all new medications as prescribed  - the antibiotic We will try to get you to podiatry or orthopedic immediately to see if an abscess needs to be drained Continue all other medications as before

## 2012-05-29 NOTE — ED Notes (Signed)
Pt reports right foot cellulitis that has gradually gotten worse over the past two days - red, swollen, tender to touch

## 2012-05-30 ENCOUNTER — Telehealth: Payer: Self-pay | Admitting: Internal Medicine

## 2012-05-30 DIAGNOSIS — G501 Atypical facial pain: Secondary | ICD-10-CM | POA: Insufficient documentation

## 2012-05-30 DIAGNOSIS — E871 Hypo-osmolality and hyponatremia: Secondary | ICD-10-CM

## 2012-05-30 NOTE — Telephone Encounter (Signed)
There is only very rare case of low sodium with gabapentin, and none with avapro that I could find  Ok to repeat bmet here  Next mon for hyponatremia  Zella Ball to inform pt, and place future lab

## 2012-05-30 NOTE — Telephone Encounter (Signed)
Patient informed and put order in for the lab.

## 2012-05-30 NOTE — Telephone Encounter (Signed)
Caller: Jeily/Patient; Patient Name: Sheryl Lester; PCP: Oliver Barre; Best Callback Phone Number: [phone]. Patient was seen at [facility] yesterday .  She states she was told her Sodium Level was very low #125.  She states she thinks it might be her medication- Blood pressure meds Avapro or Garlise (Gabapentin) .  Recommendations to stop if this is the cause but look at previous Sodium levels . Last Sodium in chart 07/03/11 was #141.  She would like Dr. Jonny Ruiz to be aware of Sodium level and review medications with her. She is awake alert and oriented. No complaints of dizziness or weakness.  Emergent s/sx ruled out per Confusion, Disorientation Agitation Protocol with exception to "all other situations. Advised I would send information to Dr. Jonny Ruiz to review and advise concerning Sodium and present medications. Understanding expressed. Encouraged to call back for questions, changes or concerns.  PLEASE HAVE DR. JOHN REVIEW MEDICATION/LAB

## 2012-05-31 ENCOUNTER — Emergency Department (INDEPENDENT_AMBULATORY_CARE_PROVIDER_SITE_OTHER)
Admission: EM | Admit: 2012-05-31 | Discharge: 2012-05-31 | Disposition: A | Discharge status: Home / Self Care | Payer: PRIVATE HEALTH INSURANCE | Source: Home / Self Care | Attending: Emergency Medicine | Admitting: Emergency Medicine

## 2012-05-31 ENCOUNTER — Encounter: Payer: Self-pay | Admitting: Internal Medicine

## 2012-05-31 ENCOUNTER — Encounter (HOSPITAL_COMMUNITY): Payer: Self-pay | Admitting: *Deleted

## 2012-05-31 DIAGNOSIS — L02619 Cutaneous abscess of unspecified foot: Secondary | ICD-10-CM

## 2012-05-31 DIAGNOSIS — L03115 Cellulitis of right lower limb: Secondary | ICD-10-CM

## 2012-05-31 DIAGNOSIS — L03119 Cellulitis of unspecified part of limb: Secondary | ICD-10-CM

## 2012-05-31 NOTE — ED Notes (Signed)
Seen  2  Days  Ago  By  Dr  Lorenz Coaster  Here  Today  For  A  recheck

## 2012-05-31 NOTE — Assessment & Plan Note (Signed)
D/w pt, tried to reassure, .Continue all other medications as before, does not need further refills today

## 2012-05-31 NOTE — ED Provider Notes (Signed)
Chief Complaint  Patient presents with  . Wound Check    History of Present Illness:   Sheryl Lester is a 56 year old female who returns today for a scheduled recheck on cellulitis of the right foot. She had gotten a small puncture wound over the medial aspect of the foot a couple days previously. This had been swollen and red. I saw her 2 days ago. X-rays were negative. She was given Rocephin 1 g IM. She was placed on doxycycline 100 mg twice a day and given mupirocin ointment to apply topically. She has been taking her medications but has not been applying the mupirocin regularly. She states that it maybe a little bit better in that there is a little less redness in the ankle. The foot is still fairly red and tender. There's been a little bit of purulent drainage from the small puncture wound on the foot. She denies any fever or chills.  Review of Systems:  Other than noted above, the patient denies any of the following symptoms: Systemic:  No fever, chills, sweats, weight loss, or fatigue. ENT:  No nasal congestion, rhinorrhea, sore throat, swelling of lips, tongue or throat. Resp:  No cough, wheezing, or shortness of breath. Skin:  No rash, itching, nodules, or suspicious lesions.  PMFSH:  Past medical history, family history, social history, meds, and allergies were reviewed.  Physical Exam:   Vital signs:  BP 157/103  Pulse 92  Temp 98.2 F (36.8 C) (Oral)  Resp 19  SpO2 100% Gen:  Alert, oriented, in no distress. ENT:  Pharynx clear, no intraoral lesions, moist mucous membranes. Lungs:  Clear to auscultation. Skin:  Exam of the foot reveals still erythema, swelling, heat, and tenderness of the dorsum of the foot, but there is a little less erythema in the ankle. There was a small amount of purulent drainage from the puncture wound. Pulses were full, sensation was intact, she is able to move her toes.  Assessment:  The encounter diagnosis was Cellulitis of right foot.  Plan:     1.  The following meds were prescribed:   New Prescriptions   No medications on file   2.  The patient was instructed in symptomatic care and handouts were given. She was instructed to continue her antibiotics and return again if he should become worse. She has an appointment to see her primary care physician and I suggested she ask him to check it as well. 3.  The patient was told to return if becoming worse in any way, if no better in 3 or 4 days, and given some red flag symptoms that would indicate earlier return.     Reuben Likes, MD 05/31/12 1346

## 2012-05-31 NOTE — Assessment & Plan Note (Signed)
Borderline low, asympt, ok to hold BP med for 2-3 days, cont to monitor at home and call for persistent lower pressure

## 2012-05-31 NOTE — Assessment & Plan Note (Signed)
?   Early abscess though at very least has marked cellulitis, for doxy course, cont pain med as is, and I have referred her to podiatry to eval early tomorrow, but I think due to pain she may go to ER

## 2012-06-01 ENCOUNTER — Encounter (HOSPITAL_COMMUNITY): Payer: Self-pay | Admitting: Emergency Medicine

## 2012-06-01 ENCOUNTER — Emergency Department (HOSPITAL_COMMUNITY)
Admission: EM | Admit: 2012-06-01 | Discharge: 2012-06-01 | Disposition: A | Discharge status: Home / Self Care | Payer: PRIVATE HEALTH INSURANCE | Source: Home / Self Care | Attending: Emergency Medicine | Admitting: Emergency Medicine

## 2012-06-01 DIAGNOSIS — L0390 Cellulitis, unspecified: Secondary | ICD-10-CM

## 2012-06-01 DIAGNOSIS — L0291 Cutaneous abscess, unspecified: Secondary | ICD-10-CM

## 2012-06-01 MED ORDER — CEFTRIAXONE SODIUM 1 G IJ SOLR
1.0000 g | Freq: Once | INTRAMUSCULAR | Status: AC
  Administered 2012-06-01: 1 g via INTRAMUSCULAR

## 2012-06-01 MED ORDER — CLINDAMYCIN HCL 300 MG PO CAPS: 300.0000 mg | ORAL_CAPSULE | Freq: Four times a day (QID) | ORAL | Status: DC

## 2012-06-01 MED ORDER — LIDOCAINE HCL (PF) 1 % IJ SOLN
INTRAMUSCULAR | Status: AC
  Filled 2012-06-01: qty 5

## 2012-06-01 MED ORDER — CEFTRIAXONE SODIUM 1 G IJ SOLR
INTRAMUSCULAR | Status: AC
  Filled 2012-06-01: qty 10

## 2012-06-01 NOTE — ED Provider Notes (Signed)
Chief Complaint  Patient presents with  . Foot Pain    History of Present Illness:   Sheryl Lester is a 56 year old female whom I have seen twice before for cellulitis of the right foot. This began with a puncture wound over the medial aspect of the foot. She was initially given Rocephin and oral doxycycline. She came back yesterday for a recheck after 48 hours and felt a little bit better. She was continued on the doxycycline. Today she states that the foot looks slightly worse to her. It doesn't feel any worse and there is absolutely no pain. She's been up and around on it, perhaps too much so. She denies any fever or chills. The foot feels a little bit more puffy and erythematous than it did yesterday, however the erythema and swelling has not spread above the ankle. Initially, there was nothing to culture since she just had a dry scab over the puncture wound. Yesterday when she came in there was a little bit of exudate, but she had been on antibiotics for over 48 hours, so I did not think a culture would be helpful.  Review of Systems:  Other than noted above, the patient denies any of the following symptoms: Systemic:  No fever, chills, sweats, weight loss, or fatigue. ENT:  No nasal congestion, rhinorrhea, sore throat, swelling of lips, tongue or throat. Resp:  No cough, wheezing, or shortness of breath. Skin:  No rash, itching, nodules, or suspicious lesions.  PMFSH:  Past medical history, family history, social history, meds, and allergies were reviewed.  Physical Exam:   Vital signs:  BP 140/93  Pulse 105  Temp 98.5 F (36.9 C) (Oral)  Resp 20  SpO2 100% Gen:  Alert, oriented, in no distress. ENT:  Pharynx clear, no intraoral lesions, moist mucous membranes. Lungs:  Clear to auscultation. Skin:  The foot looks about the same as it did yesterday. There is still swelling and erythema extending to the entire foot. The puncture wound appears to be looking a little bit better. There is  no exudate today. Is dry but there is no scab or crust present. There is no pain over the dorsum of the foot. She has minimal pain around the puncture wound. She is able to move all her toes well and has good sensation in all her toes. Pedal pulses were full.  Course in Urgent Care Center:   She was given Rocephin 1 g IM and tolerated this well without any immediate side effects.  Assessment:  The encounter diagnosis was Cellulitis.  This has been somewhat slow to heal up. To me it doesn't look any worse than it did yesterday. There is no swelling or redness extending above the ankle, it's not at all painful to her and she has no fever. I gave her the option of staying on the same medications, switching to a different antibiotic, or referral to the hospital emergency room. She adamantly does not want to go to the emergency room. I suggested switching to a different antibiotic and trying a second shot of Rocephin. She was in agreement with this plan. She will return in 48 hours if it's not improving. At that point I think the next step would be to have her go to the emergency room.  Plan:   1.  The following meds were prescribed:   New Prescriptions   CLINDAMYCIN (CLEOCIN) 300 MG CAPSULE    Take 1 capsule (300 mg total) by mouth 4 (four) times daily.  2.  The patient was instructed in symptomatic care and handouts were given. 3.  The patient was told to return if becoming worse in any way, if no better in 3 or 4 days, and given some red flag symptoms that would indicate earlier return.     Reuben Likes, MD 06/01/12 1324

## 2012-06-01 NOTE — ED Notes (Addendum)
Redness in foot is spreading. Pt has no other complaints.

## 2012-06-02 ENCOUNTER — Encounter: Payer: Self-pay | Admitting: Internal Medicine

## 2012-06-02 ENCOUNTER — Other Ambulatory Visit (INDEPENDENT_AMBULATORY_CARE_PROVIDER_SITE_OTHER): Payer: PRIVATE HEALTH INSURANCE

## 2012-06-02 DIAGNOSIS — Z0000 Encounter for general adult medical examination without abnormal findings: Secondary | ICD-10-CM

## 2012-06-02 LAB — HEPATIC FUNCTION PANEL
ALT: 12 U/L (ref 0–35)
Bilirubin, Direct: 0.1 mg/dL (ref 0.0–0.3)
Total Bilirubin: 0.2 mg/dL — ABNORMAL LOW (ref 0.3–1.2)
Total Protein: 6.8 g/dL (ref 6.0–8.3)

## 2012-06-02 LAB — CBC WITH DIFFERENTIAL/PLATELET
Eosinophils Absolute: 0.5 10*3/uL (ref 0.0–0.7)
HCT: 33.8 % — ABNORMAL LOW (ref 36.0–46.0)
Lymphs Abs: 1.4 10*3/uL (ref 0.7–4.0)
MCHC: 33.1 g/dL (ref 30.0–36.0)
MCV: 85.6 fl (ref 78.0–100.0)
Monocytes Absolute: 0.4 10*3/uL (ref 0.1–1.0)
Neutrophils Relative %: 54.6 % (ref 43.0–77.0)
Platelets: 289 10*3/uL (ref 150.0–400.0)
RDW: 12.9 % (ref 11.5–14.6)

## 2012-06-02 LAB — BASIC METABOLIC PANEL
BUN: 13 mg/dL (ref 6–23)
Chloride: 101 mEq/L (ref 96–112)
GFR: 110.57 mL/min (ref >60.00)
Glucose, Bld: 86 mg/dL (ref 70–99)
Potassium: 4.5 mEq/L (ref 3.5–5.1)
Sodium: 138 mEq/L (ref 135–145)

## 2012-06-02 LAB — URINALYSIS, ROUTINE W REFLEX MICROSCOPIC
Bilirubin Urine: NEGATIVE
Hgb urine dipstick: NEGATIVE
Ketones, ur: NEGATIVE
Leukocytes, UA: NEGATIVE
Nitrite: NEGATIVE
Urobilinogen, UA: 0.2 (ref 0.0–1.0)
pH: 6.5 (ref 5.0–8.0)

## 2012-06-02 LAB — LIPID PANEL
Cholesterol: 149 mg/dL (ref 0–200)
HDL: 64.4 mg/dL (ref >39.00)
LDL Cholesterol: 71 mg/dL (ref 0–99)
VLDL: 13.8 mg/dL (ref 0.0–40.0)

## 2012-06-02 LAB — IBC PANEL
Iron: 85 ug/dL (ref 42–145)
Saturation Ratios: 29.6 % (ref 20.0–50.0)

## 2012-06-02 LAB — VITAMIN B12: Vitamin B-12: 331 pg/mL (ref 211–911)

## 2012-06-05 ENCOUNTER — Telehealth: Payer: Self-pay | Admitting: Internal Medicine

## 2012-06-05 NOTE — Telephone Encounter (Signed)
She is calling because her [facility] neurologist wanted her to call about getting a different blood pressure medicine ordered.  Her sodium went up 10 points to 134 after being off her blood pressure medicine for 3 days.

## 2012-06-06 MED ORDER — AMLODIPINE BESYLATE 5 MG PO TABS: 5.0000 mg | ORAL_TABLET | Freq: Every day | ORAL | Status: DC

## 2012-06-06 NOTE — Telephone Encounter (Signed)
Patient informed. 

## 2012-06-06 NOTE — Telephone Encounter (Signed)
Ok for change avapro to amlodipine 5 mg - done erx  Also avapro added to "allergy" list

## 2012-06-17 ENCOUNTER — Other Ambulatory Visit: Payer: Self-pay | Admitting: Internal Medicine

## 2012-07-09 ENCOUNTER — Ambulatory Visit: Payer: PRIVATE HEALTH INSURANCE | Admitting: Internal Medicine

## 2012-07-09 DIAGNOSIS — Z0289 Encounter for other administrative examinations: Secondary | ICD-10-CM

## 2012-07-12 ENCOUNTER — Encounter (HOSPITAL_COMMUNITY): Payer: Self-pay | Admitting: Emergency Medicine

## 2012-07-12 ENCOUNTER — Emergency Department (HOSPITAL_COMMUNITY)
Admission: EM | Admit: 2012-07-12 | Discharge: 2012-07-12 | Disposition: A | Discharge status: Home / Self Care | Payer: PRIVATE HEALTH INSURANCE | Source: Home / Self Care | Attending: Emergency Medicine | Admitting: Emergency Medicine

## 2012-07-12 DIAGNOSIS — J069 Acute upper respiratory infection, unspecified: Secondary | ICD-10-CM

## 2012-07-12 NOTE — ED Notes (Signed)
Reports throat is feeling better after being in treatment room, talking with staff.  Patient is talking in complete sentences.  C/o mouth feeling very dry

## 2012-07-12 NOTE — ED Provider Notes (Signed)
History     CSN: [id number]  Arrival date & time 07/12/12  1619   None     Chief Complaint  Patient presents with  . Oral Swelling    (Consider location/radiation/quality/duration/timing/severity/associated sxs/prior treatment) The history is provided by the patient.  patient reports known history of trigeminal neuralgia, today she had similar symptoms as she had in the past but she also felt as if her throat was closing.  States she became anxious and wanted immediate evaluation.  Reports no known intake of food/medication that could be a causative agent.  States she has not had a change in medications.  Admits to uri symptoms, husband has had flu-like symptoms.  Has not taken medication for symptoms.    Past Medical History  Diagnosis Date  . ALLERGIC RHINITIS 01/20/2008  . ANEMIA-IRON DEFICIENCY 01/20/2008  . ANXIETY 01/20/2008  . ASTHMATIC BRONCHITIS, ACUTE 05/19/2008  . CENTRAL SLEEP APNEA CONDS CLASSIFIED ELSEWHERE 08/31/2010  . DEPRESSION 01/20/2008  . DYSPHAGIA UNSPECIFIED 12/06/2008  . ELEVATED BLOOD PRESSURE WITHOUT DIAGNOSIS OF HYPERTENSION 12/06/2008  . GERD 12/06/2008  . HYPERLIPIDEMIA 01/20/2008  . HYPERTENSION 12/16/2008  . OTITIS MEDIA, ACUTE, BILATERAL 12/16/2008  . OTITIS MEDIA, SEROUS, CHRONIC 12/30/2008  . PVD 05/12/2010  . SINUSITIS- ACUTE-NOS 09/21/2008  . SWELLING MASS OR LUMP IN HEAD AND NECK 09/21/2008  . TIA 12/16/2008  . Trigeminal neuralgia 01/20/2008  . Unspecified hearing loss 09/21/2008  . VENOUS INSUFFICIENCY, LEGS 05/03/2010  . THYROID NODULE, LEFT 01/07/2009    Past Surgical History  Procedure Date  . Abdominal hysterectomy   . Microvascular decompression      for chronic facial pain/ [facility]  . Oophorectomy   . Appendectomy   . Breast surgery     Left breast biopsy, Benign  . Abdominal exploration surgery 07/1986    Hernia repair-femoral    Family History  Problem Relation Age of Onset  . Hyperlipidemia Mother   . Hypertension Mother   . Diabetes  Father   . Melanoma Sister   . Breast cancer Paternal Aunt   . Heart disease Paternal Grandmother   . Heart disease Other     Mother side of family-several uncles     History  Substance Use Topics  . Smoking status: Never Smoker   . Smokeless tobacco: Not on file  . Alcohol Use: No    OB History    Grav Para Term Preterm Abortions TAB SAB Ect Mult Living                  Review of Systems  All other systems reviewed and are negative.    Allergies  Avapro and Sulfamethoxazole w-trimethoprim  Home Medications   Current Outpatient Rx  Name  Route  Sig  Dispense  Refill  . AMLODIPINE BESYLATE 5 MG PO TABS   Oral   Take 1 tablet (5 mg total) by mouth daily.   90 tablet   3   . ARMODAFINIL 250 MG PO TABS   Oral   Take 250 mg by mouth daily.         Marland Kitchen BUPRENORPHINE 20 MCG/HR TD PTWK   Transdermal   Place onto the skin.           Marland Kitchen CLINDAMYCIN HCL 300 MG PO CAPS   Oral   Take 1 capsule (300 mg total) by mouth 4 (four) times daily.   40 capsule   0   . CLONAZEPAM 0.5 MG PO TABS   Oral   Take  1 tablet (0.5 mg total) by mouth 2 (two) times daily. As needed   60 tablet   2   . GRALISE PO   Oral   Take by mouth daily.         Marland Kitchen MINOCYCLINE HCL 100 MG PO TABS   Oral   Take 100 mg by mouth 2 (two) times daily.         Marland Kitchen MUPIROCIN 2 % EX OINT   Topical   Apply topically 3 (three) times daily.   22 g   0   . PROTRIPTYLINE HCL 10 MG PO TABS      TAKE 4 TABLETS BY MOUTH EVERY MORNING   120 tablet   3   . TAPENTADOL HCL 50 MG PO TABS   Oral   Take by mouth every 4 (four) hours as needed.             BP 176/117  Pulse 69  Temp 98.7 F (37.1 C) (Oral)  Resp 28  SpO2 100%  Physical Exam  Nursing note and vitals reviewed. Constitutional: She is oriented to person, place, and time. Vital signs are normal. She appears well-developed and well-nourished. She is active and cooperative.  HENT:  Head: Normocephalic.  Right Ear: Tympanic  membrane normal.  Left Ear: Tympanic membrane normal.  Nose: Nose normal. Right sinus exhibits no maxillary sinus tenderness and no frontal sinus tenderness. Left sinus exhibits no maxillary sinus tenderness and no frontal sinus tenderness.  Mouth/Throat: Uvula is midline, oropharynx is clear and moist and mucous membranes are normal. No uvula swelling.  Eyes: Conjunctivae normal are normal. Pupils are equal, round, and reactive to light. No scleral icterus.  Neck: Trachea normal, normal range of motion, full passive range of motion without pain and phonation normal. Neck supple. No JVD present. No mass and no thyromegaly present.  Cardiovascular: Normal rate and regular rhythm.   Pulmonary/Chest: Effort normal and breath sounds normal.  Neurological: She is alert and oriented to person, place, and time. No cranial nerve deficit or sensory deficit.  Skin: Skin is warm and dry.  Psychiatric: She has a normal mood and affect. Her speech is normal and behavior is normal. Judgment and thought content normal. Cognition and memory are normal.    ED Course  Procedures (including critical care time)  Labs Reviewed - No data to display No results found.   1. URI (upper respiratory infection)       MDM  mucinex and/or zyrtec, rtc prn         Johnsie Kindred, NP 07/12/12 1728

## 2012-07-12 NOTE — ED Notes (Signed)
C/o mouth burning, but reports this is something she is used to.  She reports history of  Atypical  Trigeminal neuralgia.  Patient reports other areas of pain.  Today patient has noticed a sensation of difficulty swallowing and then noticed a burning sensation not only including mouth , but now traveling down throat.  Patient says she feels throat is swollen.  Reports throat symptoms started around 4:00 p.m. Today.  Reports mouth is dry.

## 2012-07-12 NOTE — ED Notes (Signed)
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

## 2012-07-12 NOTE — ED Provider Notes (Signed)
Medical screening examination/treatment/procedure(s) were performed by non-physician practitioner and as supervising physician I was immediately available for consultation/collaboration.  Leslee Home, M.D.   Reuben Likes, MD 07/12/12 2232

## 2012-07-31 ENCOUNTER — Telehealth: Payer: Self-pay | Admitting: Internal Medicine

## 2012-07-31 NOTE — Telephone Encounter (Signed)
Patient Information:  Caller Name: [name]  Phone: [phone]  Patient: Sheryl Lester,   Gender: Female  DOB: 1956/03/18  Age: 56 Years  PCP: Oliver Barre (Adults only)  Pregnant: No   Symptoms  Reason For Call & Symptoms: Feels like something is in the throat and feels like something in neck is out of place  Reviewed Health History In EMR: Yes  Reviewed Medications In EMR: Yes  Reviewed Allergies In EMR: Yes  Reviewed Surgeries / Procedures: N/A  Date of Onset of Symptoms: 07/31/2012 OB:  LMP: Unknown  Guideline(s) Used:  Sore Throat  Disposition Per Guideline:   Home Care  Reason For Disposition Reached:   Sore throat  Advice Given:  For Relief of Sore Throat Pain:  Sip warm chicken broth or apple juice.  Suck on hard candy or a throat lozenge (over-the-counter).  Gargle warm salt water 3 times daily (1 teaspoon of salt in 8 oz or 240 ml of warm water).  Avoid cigarette smoke.  Soft Diet:   Cold drinks and milk shakes are especially good (Reason: swollen tonsils can make some foods hard to swallow).  Liquids:  Adequate liquid intake is important to prevent dehydration. Drink 6-8 glasses of water per day.  Call Back If:  Sore throat is the main symptom and it lasts longer than 24 hours  Sore throat is mild but lasts longer than 4 days  Fever lasts longer than 3 days  You become worse.  Office Follow Up:  Does the office need to follow up with this patient?: No  Instructions For The Office: N/A  RN Note:  Awoke from nap and stood up and felt sensation like something in throat. Denies difficulty walking or pain; Can't swallow unless drinking water-but has this happens at time. Sore throat present since last night - may or may not be related; but says she also has this from time to time. Was concerned that her trachea was displaced.  RN asked her to inspect her throat for possible swollen tonsils. On inspection, things looked normal but when she touched her uvula-she said  "ah is causing sensation". Explained she could tell when she touched it.  Has a hx of Burning Mouth Syndrome."  Triaged with current symptoms with home care.  Declined to offered appointment due to no transportation or to go to [facility].  States will call back if worsen or sensation continues tomorrow.

## 2012-07-31 NOTE — Telephone Encounter (Signed)
Ok to see regina first thing in the AM

## 2012-07-31 NOTE — Telephone Encounter (Signed)
Patient informed of MD instructions and agreed to do so.

## 2012-08-01 ENCOUNTER — Ambulatory Visit (INDEPENDENT_AMBULATORY_CARE_PROVIDER_SITE_OTHER): Payer: PRIVATE HEALTH INSURANCE | Admitting: Internal Medicine

## 2012-08-01 ENCOUNTER — Encounter: Payer: Self-pay | Admitting: Internal Medicine

## 2012-08-01 VITALS — BP 142/94 | HR 99 | Temp 99.1°F | Ht 67.0 in | Wt 135.0 lb

## 2012-08-01 DIAGNOSIS — R1310 Dysphagia, unspecified: Secondary | ICD-10-CM

## 2012-08-01 NOTE — Progress Notes (Signed)
Subjective:    Patient ID: Sheryl Lester, female    DOB: 27-Jun-1956, 56 y.o.   MRN: [id number]  HPI  Pt presents to the clinic today with c/o an abnormal sensation in her throat. She feels like something in her throat may be displaced. She does have a history of dysphagia and burning sensation of the mouth. This has been going on for about 2 years. She has never been evaluated by a GI doctor. She does feel like she aspirates at times. She also c/o of constant dry mouth which makes swallowing more difficult. She did not eat anything yesterday prior to having this abnormal sensation so she is positive that there is no foreign object in her throat. She is able to swallow her saliva.  Review of Systems      Past Medical History  Diagnosis Date  . ALLERGIC RHINITIS 01/20/2008  . ANEMIA-IRON DEFICIENCY 01/20/2008  . ANXIETY 01/20/2008  . ASTHMATIC BRONCHITIS, ACUTE 05/19/2008  . CENTRAL SLEEP APNEA CONDS CLASSIFIED ELSEWHERE 08/31/2010  . DEPRESSION 01/20/2008  . DYSPHAGIA UNSPECIFIED 12/06/2008  . ELEVATED BLOOD PRESSURE WITHOUT DIAGNOSIS OF HYPERTENSION 12/06/2008  . GERD 12/06/2008  . HYPERLIPIDEMIA 01/20/2008  . HYPERTENSION 12/16/2008  . OTITIS MEDIA, ACUTE, BILATERAL 12/16/2008  . OTITIS MEDIA, SEROUS, CHRONIC 12/30/2008  . PVD 05/12/2010  . SINUSITIS- ACUTE-NOS 09/21/2008  . SWELLING MASS OR LUMP IN HEAD AND NECK 09/21/2008  . TIA 12/16/2008  . Trigeminal neuralgia 01/20/2008  . Unspecified hearing loss 09/21/2008  . VENOUS INSUFFICIENCY, LEGS 05/03/2010  . THYROID NODULE, LEFT 01/07/2009    Current Outpatient Prescriptions  Medication Sig Dispense Refill  . amLODipine (NORVASC) 5 MG tablet Take 1 tablet (5 mg total) by mouth daily.  90 tablet  3  . Armodafinil (NUVIGIL) 250 MG tablet Take 250 mg by mouth daily.      . Buprenorphine (BUTRANS) 20 MCG/HR PTWK Place onto the skin.        . clindamycin (CLEOCIN) 300 MG capsule Take 1 capsule (300 mg total) by mouth 4 (four) times daily.  40  capsule  0  . clonazePAM (KLONOPIN) 0.5 MG tablet Take 1 tablet (0.5 mg total) by mouth 2 (two) times daily. As needed  60 tablet  2  . Gabapentin, PHN, (GRALISE PO) Take by mouth daily.      . minocycline (DYNACIN) 100 MG tablet Take 100 mg by mouth 2 (two) times daily.      . mupirocin ointment (BACTROBAN) 2 % Apply topically 3 (three) times daily.  22 g  0  . protriptyline (VIVACTIL) 10 MG tablet TAKE 4 TABLETS BY MOUTH EVERY MORNING  120 tablet  3  . tapentadol (NUCYNTA) 50 MG TABS Take by mouth every 4 (four) hours as needed.          Allergies  Allergen Reactions  . Avapro (Irbesartan) Other (See Comments)    hyponatremia  . Sulfamethoxazole W-Trimethoprim     Family History  Problem Relation Age of Onset  . Hyperlipidemia Mother   . Hypertension Mother   . Diabetes Father   . Melanoma Sister   . Breast cancer Paternal Aunt   . Heart disease Paternal Grandmother   . Heart disease Other     Mother side of family-several uncles     History   Social History  . Marital Status: Married    Spouse Name: N/A    Number of Children: N/A  . Years of Education: N/A   Occupational History  . Not on  file.   Social History Main Topics  . Smoking status: Never Smoker   . Smokeless tobacco: Not on file  . Alcohol Use: No  . Drug Use: No  . Sexually Active:    Other Topics Concern  . Not on file   Social History Narrative  . No narrative on file     Constitutional: Denies fever, malaise, fatigue, headache or abrupt weight changes.  HEENT: Pt reports abnormal sensation in her throat. Denies eye pain, eye redness, ear pain, ringing in the ears, wax buildup, runny nose, nasal congestion, bloody nose. Respiratory: Denies difficulty breathing, shortness of breath, cough or sputum production.   Cardiovascular: Denies chest pain, chest tightness, palpitations or swelling in the hands or feet.  Gastrointestinal: Denies abdominal pain, bloating, constipation, diarrhea or blood in  the stool.   No other specific complaints in a complete review of systems (except as listed in HPI above).  Objective:   Physical Exam   BP 142/94  Pulse 99  Temp 99.1 F (37.3 C) (Oral)  Ht 5\' 7"  (1.702 m)  Wt 135 lb (61.236 kg)  BMI 21.14 kg/m2  SpO2 98% Wt Readings from Last 3 Encounters:  08/01/12 135 lb (61.236 kg)  05/29/12 136 lb 6 oz (61.859 kg)  04/29/12 135 lb 12 oz (61.576 kg)    General: Appears her stated age, well developed, well nourished in NAD. HEENT: Head: normal shape and size; Eyes: sclera white, no icterus, conjunctiva pink, PERRLA and EOMs intact; Ears: Tm's gray and intact, normal light reflex; Nose: mucosa pink and moist, septum midline; Throat/Mouth: Teeth present, mucosa pink and moist, no exudate, lesions or ulcerations noted.  Neck: Normal range of motion. Neck supple, trachea midline. No massses, lumps present. Thyromegaly noted.  Cardiovascular: Normal rate and rhythm. S1,S2 noted.  No murmur, rubs or gallops noted. No JVD or BLE edema. No carotid bruits noted. Pulmonary/Chest: Normal effort and positive vesicular breath sounds. No respiratory distress. No wheezes, rales or ronchi noted.  Abdomen: Soft and nontender. Normal bowel sounds, no bruits noted. No distention or masses noted. Liver, spleen and kidneys non palpable.       Assessment & Plan:   Dysphagia, chronic problem with additional workup required:  Will not obtain xray as patient is sure she did not swallow anything that could be stuck. Will refer to GI for possible upper endoscopy to assess for anatomical abnormalities.  RTC as needed or if symptoms persist

## 2012-08-01 NOTE — Patient Instructions (Addendum)
Dysphagia Swallowing problems (dysphagia) occur when solids and liquids seem to stick in your throat on the way down to your stomach, or the food takes longer to get to the stomach. Other symptoms (problems) include regurgitating (burping) up food, noises coming from the throat, chest discomfort with swallowing, and a feeling of fullness in the throat when swallowing. When blockage in the throat is complete it may be associated with drooling. CAUSES There are many causes of swallowing difficulties and the following is generalized information regarding a number of reasons for this problem. Problems with swallowing may occur because of problems with the muscles. The food cannot be propelled in the usual manner into the stomach. There may be ulcers, scar tissue, or inflammation (soreness) in the esophagus (the food tube from the mouth to the stomach) which blocks food from passing normally into the stomach. Causes of inflammation include acid reflux from the stomach into the esophagus. Inflammation can also be caused by the herpes simplex virus, Candida (yeast), radiation (as with treatment of cancer), or inflammation from medicines not taken with adequate fluids to wash them down into the stomach. There may be nerve problems so signals cannot be sent adequately telling the muscles of the esophagus to contract and move the food along. Achalasia is a rare disorder of the esophagus in which muscular contractions of the esophagus are uncoordinated. Globus hystericus is a relatively common problem in young females in which there is a sense of an obstruction or difficulty in swallowing, but in which no abnormalities can be found. This problem usually improves over time with reassurance and testing to rule out other causes. DIAGNOSIS A number of tests will help your caregiver know what is the cause of your swallowing problems. These tests may include a barium swallow in which X-rays are taken while you are drinking a  liquid that outlines the lining of the esophagus on X-ray. If the stomach and small bowel are also studied in this manner it is called an upper gastrointestinal exam (UGI). Endoscopy may be done in which your caregiver examines your throat, esophagus, stomach, and small bowel with a small, flexible scope. Motility studies which measure the effectiveness and coordination of the muscular contractions of the esophagus may also be done. TREATMENT The treatment of swallowing problems are many, varying from medicines to surgical treatment. The treatment varies with the type of problem found. Your caregiver will discuss your results and treatment with you. If swallowing problems are severe the long-term problems which may occur include: malnutrition, pneumonia (from food going into the breathing tubes called trachea and bronchi), and an increase in tumors (lumps) of the esophagus. SEEK IMMEDIATE MEDICAL CARE IF:  Food or another object becomes lodged in your throat or esophagus and will not move. Document Released: 08/10/2000 Document Revised: 02/12/2012 Document Reviewed: 03/31/2008 [employer] Patient Information 2013 [employer].  

## 2012-08-05 ENCOUNTER — Encounter: Payer: Self-pay | Admitting: Internal Medicine

## 2012-08-12 ENCOUNTER — Encounter: Payer: Self-pay | Admitting: Internal Medicine

## 2012-08-12 ENCOUNTER — Ambulatory Visit (INDEPENDENT_AMBULATORY_CARE_PROVIDER_SITE_OTHER): Payer: PRIVATE HEALTH INSURANCE | Admitting: Internal Medicine

## 2012-08-12 VITALS — BP 150/100 | HR 111 | Temp 97.2°F | Ht 67.0 in | Wt 136.1 lb

## 2012-08-12 DIAGNOSIS — I10 Essential (primary) hypertension: Secondary | ICD-10-CM

## 2012-08-12 DIAGNOSIS — R1310 Dysphagia, unspecified: Secondary | ICD-10-CM

## 2012-08-12 MED ORDER — LIDOCAINE VISCOUS 2 % MT SOLN: 20.0000 mL | OROMUCOSAL | Status: DC | PRN

## 2012-08-12 MED ORDER — MORPHINE SULFATE ER 30 MG PO TBCR: 30.0000 mg | EXTENDED_RELEASE_TABLET | Freq: Two times a day (BID) | ORAL | Status: DC

## 2012-08-12 MED ORDER — FLUCONAZOLE 100 MG PO TABS: 100.0000 mg | ORAL_TABLET | Freq: Every day | ORAL | Status: DC

## 2012-08-12 NOTE — Assessment & Plan Note (Signed)
Pt for egd soon , r/o malignancy or infection

## 2012-08-12 NOTE — Assessment & Plan Note (Signed)
elev today ? Due to apin, stable overall by hx and exam, most recent data reviewed with pt, and pt delcines other tx change today BP Readings from Last 3 Encounters:  08/12/12 150/100  08/01/12 142/94  07/12/12 165/107

## 2012-08-12 NOTE — Patient Instructions (Addendum)
We will hold on further tx at this time if you plan to go to ER Continue all other medications as before Please keep your appointments with your specialists as you have planned  ADD:  Pt states will hold on ER at this time Take all new medications as prescribed - the morphine as prescribed You are given the supportive letter to explain the morphine to your pain physician Please leave information as you can regarding the name, address, fax if you have it for your Pain Physician so this can be faxed as well Take all new medications as prescribed - the diflucan for ? Of fungal esophagitis  I will ask Dr Marina Goodell to consider seeing you sooner than now planned

## 2012-08-12 NOTE — Assessment & Plan Note (Addendum)
Pt with chronic pain/trigeminal neuralgia, but Cant r/o esoph candida or herpes; I offered prn MSo4 in addition to the butrans, viscous lidocaine, as well as diflucan trial, and earlier appt with Dr Marina Goodell for EGD but husband is adamant for immediate eval and admit to hospital, declines all rx and plans to go now to ER  ADD:  Husband and pt change minds just before leaving - ok to tx as above

## 2012-08-12 NOTE — Progress Notes (Signed)
Subjective:    Patient ID: Sheryl Lester, female    DOB: 1956/02/07, 56 y.o.   MRN: [id number]  HPI  Pt seen urgently today after presenting for counseling with her psychologist who called med earlier today; pt was related to me as "writhing in pain" and I asked pt to come for visit;  While here relates pain not really c/w her hx of typical Trigeminal neuralgia for which she had been on fentayl patch for several yrs after eval per Sheryl Lester who rec'd conservative management;  Has been seeing Sheryl Lester at [facility] ([place]) on butrans for over a yr (about whom husband becomes more agitated and keeps interrupting to have Sheryl Lester go to ER for admit to hosp for pain control);  Pt states she has been diagnosed with burning mouth syndrome with apparent flare of pain to tongue, mouth,throat in recent days (still on butrans), also with unexplained ear and hands stinging pains which pt states she has been told all relates to some neurodegnerative disorder; has recently seen Sheryl Lester [facility]) with abnormal UE evoked potential testing (LE's inconclusive as testing for some reason suboptimal) as well as a + test for lyme dz, tx with antibx since oct 20, has f/u OV for Aug 28 2012, and 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  Is on daily minocycline, c/o recent worsening symptoms of odynophagia and dysphaiga without n/v, fever, wt loss or abd pain, reflux. No bowel change or blood.  Has high narcotic tolerance due to chronic prior use. Past Medical History  Diagnosis Date  . ALLERGIC RHINITIS 01/20/2008  . ANEMIA-IRON DEFICIENCY 01/20/2008  . ANXIETY 01/20/2008  . ASTHMATIC BRONCHITIS, ACUTE 05/19/2008  . CENTRAL SLEEP APNEA CONDS CLASSIFIED ELSEWHERE 08/31/2010  . DEPRESSION 01/20/2008  . DYSPHAGIA UNSPECIFIED 12/06/2008  . ELEVATED BLOOD PRESSURE WITHOUT DIAGNOSIS OF HYPERTENSION 12/06/2008  . GERD 12/06/2008  . HYPERLIPIDEMIA 01/20/2008  . HYPERTENSION 12/16/2008   . OTITIS MEDIA, ACUTE, BILATERAL 12/16/2008  . OTITIS MEDIA, SEROUS, CHRONIC 12/30/2008  . PVD 05/12/2010  . SINUSITIS- ACUTE-NOS 09/21/2008  . SWELLING MASS OR LUMP IN HEAD AND NECK 09/21/2008  . TIA 12/16/2008  . Trigeminal neuralgia 01/20/2008  . Unspecified hearing loss 09/21/2008  . VENOUS INSUFFICIENCY, LEGS 05/03/2010  . THYROID NODULE, LEFT 01/07/2009   Past Surgical History  Procedure Date  . Abdominal hysterectomy   . Microvascular decompression      for chronic facial pain/ [facility]  . Oophorectomy   . Appendectomy   . Breast surgery     Left breast biopsy, Benign  . Abdominal exploration surgery 07/1986    Hernia repair-femoral    reports that she has never smoked. She does not have any smokeless tobacco history on file. She reports that she does not drink alcohol or use illicit drugs. family history includes Breast cancer in her paternal aunt; Diabetes in her father; Heart disease in her other and paternal grandmother; Hyperlipidemia in her mother; Hypertension in her mother; and Melanoma in her sister. Allergies  Allergen Reactions  . Avapro (Irbesartan) Other (See Comments)    hyponatremia  . Sulfamethoxazole W-Trimethoprim    Current Outpatient Prescriptions on File Prior to Visit  Medication Sig Dispense Refill  . amLODipine (NORVASC) 5 MG tablet Take 1 tablet (5 mg total) by mouth daily.  90 tablet  3  . Armodafinil (NUVIGIL) 250 MG tablet Take 250 mg by mouth daily.      . Buprenorphine (BUTRANS) 20 MCG/HR  PTWK Place onto the skin.        . Carbamazepine, Antipsychotic, (EQUETRO) 300 MG CP12 Take 1 capsule by mouth 2 (two) times daily.      . clonazePAM (KLONOPIN) 0.5 MG tablet Take 1 tablet (0.5 mg total) by mouth 2 (two) times daily. As needed  60 tablet  2  . minocycline (DYNACIN) 100 MG tablet Take 100 mg by mouth 2 (two) times daily.      . protriptyline (VIVACTIL) 10 MG tablet TAKE 4 TABLETS BY MOUTH EVERY MORNING  120 tablet  3  . tapentadol (NUCYNTA) 50 MG TABS  Take by mouth every 4 (four) hours as needed.         Review of Systems  Constitutional: Negative for diaphoresis and unexpected weight change.  HENT: Negative for tinnitus.   Eyes: Negative for photophobia and visual disturbance.  Respiratory: Negative for choking and stridor.   Gastrointestinal: Negative for vomiting and blood in stool.  Genitourinary: Negative for hematuria and decreased urine volume.  Musculoskeletal: Negative for gait problem.  Skin: Negative for color change and wound.  Neurological: Negative for tremors  Psychiatric/Behavioral: Negative for decreased concentration. The patient is not hyperactive.      Objective:   Physical Exam BP 150/100  Pulse 111  Temp 97.2 F (36.2 C) (Oral)  Ht 5\' 7"  (1.702 m)  Wt 136 lb 2 oz (61.746 kg)  BMI 21.32 kg/m2  SpO2 96% Physical Exam  VS noted, some agitated and moving about in chair but able to give hx and coooperate well with exam Husband exasperated, frequent interrruptions and complaints, makes her more upset it seems Constitutional: Pt appears well-developed and well-nourished.  HENT: Head: Normocephalic.  Right Ear: External ear normal.  Left Ear: External ear normal.  Bilat tm's mild erythema.  Sinus nontender.  Pharynx mild erythema, tongue without swelling, ? Mild erythema Eyes: Conjunctivae and EOM are normal. Pupils are equal, round, and reactive to light.  Neck: Normal range of motion. Neck supple.  Cardiovascular: Normal rate and regular rhythm.   Pulmonary/Chest: Effort normal and breath sounds normal.  Abd:  Soft, NT, non-distended, + BS Neurological: Pt is alert. Not confused , motor/gait intact Skin: Skin is warm. No erythema. No rash Psychiatric: Pt behavior is normal. Thought content normal. 1+ nervous     Assessment & Plan:

## 2012-08-14 ENCOUNTER — Telehealth: Payer: Self-pay | Admitting: Internal Medicine

## 2012-08-14 NOTE — Telephone Encounter (Signed)
The patient stated she took the morphine but not the lidocaine.  She took the morphine to get the edge off the pain and does feel much better today.Swallowing is better today just knows to keep liquid close by to help.  The patient would like to thank you for taking such good care of her. She also did state she is not taking the morphine on a daily basis just as needed

## 2012-08-14 NOTE — Telephone Encounter (Signed)
Robin to call pt regarding how she is doing with the medications provided (morphine, lidocaine) and her pain and overall ability to swallow

## 2012-08-30 DIAGNOSIS — A6920 Lyme disease, unspecified: Secondary | ICD-10-CM | POA: Insufficient documentation

## 2012-09-01 ENCOUNTER — Encounter: Payer: Self-pay | Admitting: Internal Medicine

## 2012-09-01 ENCOUNTER — Telehealth: Payer: Self-pay | Admitting: Internal Medicine

## 2012-09-01 ENCOUNTER — Ambulatory Visit: Payer: PRIVATE HEALTH INSURANCE | Admitting: Internal Medicine

## 2012-09-01 ENCOUNTER — Ambulatory Visit (INDEPENDENT_AMBULATORY_CARE_PROVIDER_SITE_OTHER): Payer: PRIVATE HEALTH INSURANCE | Admitting: Internal Medicine

## 2012-09-01 VITALS — BP 144/96 | HR 60 | Ht 67.0 in | Wt 135.0 lb

## 2012-09-01 DIAGNOSIS — R1310 Dysphagia, unspecified: Secondary | ICD-10-CM

## 2012-09-01 MED ORDER — OMEPRAZOLE 20 MG PO CPDR: 20.0000 mg | DELAYED_RELEASE_CAPSULE | Freq: Every day | ORAL | Status: DC

## 2012-09-01 NOTE — Progress Notes (Signed)
HISTORY OF PRESENT ILLNESS:  Sheryl Lester is a 57 y.o. female with chronic trigeminal neuralgia with chronic pain syndrome on chronic narcotics, chronic depression and anxiety (previously requiring hospitalization), interstitial cystitis, hypertension, hyperlipidemia, sinus and allergies. I last saw the patient in the office in May of 2010 regarding dysphagia. See that dictation for details. She does have a prior history of incidental duplication cyst of the esophagus (assessment and aspiration via EUS). Vague complaints of dysphagia in 2010 seemed most consistent with transfer type dysphagia. She declined endoscopy and barium radiology. Seemingly improved somewhat with PPI, at that time. She comes in today, upon referral from her primary provider, complaining of pain all over her body. Multiple non-GI complaints for which she is apparently undergoing extensive neurologic workup at [facility]. Here today complaining of "burning mouth syndrome". Also a sensation that her throat is closing up (for which she presented herself to urgent care) and she has burning into the esophagus. Vague dysphagia and globus type sensation. No true heartburn. Previous upper EUS, as mentioned. As well, normal complete colonoscopy May of 2011. Routine followup in 10 years recommended laboratories from October 2013 unremarkable.  REVIEW OF SYSTEMS:  All non-GI ROS assessed. Multiple complaints to numerous to list  Past Medical History  Diagnosis Date  . ALLERGIC RHINITIS 01/20/2008  . ANEMIA-IRON DEFICIENCY 01/20/2008  . ANXIETY 01/20/2008  . ASTHMATIC BRONCHITIS, ACUTE 05/19/2008  . CENTRAL SLEEP APNEA CONDS CLASSIFIED ELSEWHERE 08/31/2010  . DEPRESSION 01/20/2008  . DYSPHAGIA UNSPECIFIED 12/06/2008  . ELEVATED BLOOD PRESSURE WITHOUT DIAGNOSIS OF HYPERTENSION 12/06/2008  . GERD 12/06/2008  . HYPERLIPIDEMIA 01/20/2008  . HYPERTENSION 12/16/2008  . OTITIS MEDIA, ACUTE, BILATERAL 12/16/2008  . OTITIS MEDIA, SEROUS, CHRONIC 12/30/2008    . PVD 05/12/2010  . SINUSITIS- ACUTE-NOS 09/21/2008  . SWELLING MASS OR LUMP IN HEAD AND NECK 09/21/2008  . TIA 12/16/2008  . Trigeminal neuralgia 01/20/2008  . Unspecified hearing loss 09/21/2008  . VENOUS INSUFFICIENCY, LEGS 05/03/2010  . THYROID NODULE, LEFT 01/07/2009    Past Surgical History  Procedure Date  . Abdominal hysterectomy   . Microvascular decompression      for chronic facial pain/ [facility]  . Oophorectomy   . Appendectomy   . Breast surgery     Left breast biopsy, Benign  . Abdominal exploration surgery 07/1986    Hernia repair-femoral    Social History Sheryl Lester  reports that she has never smoked. She has never used smokeless tobacco. She reports that she does not drink alcohol or use illicit drugs.  family history includes Breast cancer in her paternal aunt; Diabetes in her father; Heart disease in her other and paternal grandmother; Hyperlipidemia in her mother; Hypertension in her mother; and Melanoma in her sister.  Allergies  Allergen Reactions  . Avapro (Irbesartan) Other (See Comments)    hyponatremia  . Sulfamethoxazole W-Trimethoprim        PHYSICAL EXAMINATION: Vital signs: BP 144/96  Pulse 60  Ht 5\' 7"  (1.702 m)  Wt 135 lb (61.236 kg)  BMI 21.14 kg/m2  Constitutional: generally well-appearing, no acute distress Psychiatric: alert and oriented x3, cooperative Eyes: extraocular movements intact, anicteric, conjunctiva pink Mouth: oral pharynx moist, no lesions Neck: supple no lymphadenopathy Cardiovascular: heart regular rate and rhythm, no murmur Lungs: clear to auscultation bilaterally Abdomen: soft, nontender, nondistended, no obvious ascites, no peritoneal signs, normal bowel sounds, no organomegaly Extremities: no lower extremity edema bilaterally Skin: no lesions on visible extremities Neuro: No focal deficits.   ASSESSMENT:  #1.  Vague pharyngeal and esophageal burning complaints. As well vague dysphagia #2. History of  incidental duplication cyst of the esophagus workup with EUS and aspiration in 2006 #3. Normal screening colonoscopy May 2011   PLAN:  #1. Upper endoscopy to evaluate for any significant structural abnormalities and or inflammatory conditions. She is very interested in proceeding.The nature of the procedure, as well as the risks, benefits, and alternatives were carefully and thoroughly reviewed with the patient. Ample time for discussion and questions allowed. The patient understood, was satisfied, and agreed to proceed.  #2. Short trial of empiric PPI to see if this has any positive impact on current symptoms. 16 days of Prilosec 20 mg provided #3. Screening colonoscopy recommended around May of 2021 #4. If upper endoscopy unremarkable and no response to PPI, she should return to the care of her primary provider and neurologist regarding her multitude of other complaints

## 2012-09-01 NOTE — Telephone Encounter (Signed)
Note not needed 

## 2012-09-01 NOTE — Patient Instructions (Addendum)
You have been scheduled for an endoscopy with propofol. Please follow written instructions given to you at your visit today. If you use inhalers (even only as needed) or a CPAP machine, please bring them with you on the day of your procedure.  We have given some samples of Prilosec to try.  Take one a day

## 2012-09-05 ENCOUNTER — Encounter: Payer: Self-pay | Admitting: Internal Medicine

## 2012-09-10 ENCOUNTER — Other Ambulatory Visit: Payer: Self-pay | Admitting: Internal Medicine

## 2012-09-10 NOTE — Telephone Encounter (Signed)
Done hardcopy to robin  

## 2012-09-11 NOTE — Telephone Encounter (Signed)
Faxed hardcopy to pharmacy. 

## 2012-09-23 ENCOUNTER — Ambulatory Visit (INDEPENDENT_AMBULATORY_CARE_PROVIDER_SITE_OTHER): Payer: PRIVATE HEALTH INSURANCE | Admitting: Internal Medicine

## 2012-09-23 ENCOUNTER — Encounter: Payer: Self-pay | Admitting: Internal Medicine

## 2012-09-23 ENCOUNTER — Ambulatory Visit (AMBULATORY_SURGERY_CENTER): Payer: PRIVATE HEALTH INSURANCE | Admitting: Internal Medicine

## 2012-09-23 VITALS — BP 135/87 | HR 83 | Temp 98.4°F | Resp 18 | Ht 67.0 in | Wt 135.0 lb

## 2012-09-23 VITALS — BP 116/80 | HR 104 | Temp 98.3°F | Ht 67.0 in | Wt 138.2 lb

## 2012-09-23 DIAGNOSIS — R1310 Dysphagia, unspecified: Secondary | ICD-10-CM

## 2012-09-23 DIAGNOSIS — F411 Generalized anxiety disorder: Secondary | ICD-10-CM

## 2012-09-23 DIAGNOSIS — L989 Disorder of the skin and subcutaneous tissue, unspecified: Secondary | ICD-10-CM | POA: Insufficient documentation

## 2012-09-23 DIAGNOSIS — I10 Essential (primary) hypertension: Secondary | ICD-10-CM

## 2012-09-23 DIAGNOSIS — Z0000 Encounter for general adult medical examination without abnormal findings: Secondary | ICD-10-CM

## 2012-09-23 DIAGNOSIS — H9190 Unspecified hearing loss, unspecified ear: Secondary | ICD-10-CM

## 2012-09-23 MED ORDER — AMLODIPINE BESYLATE 2.5 MG PO TABS: ORAL_TABLET | ORAL | Status: DC

## 2012-09-23 MED ORDER — SODIUM CHLORIDE 0.9 % IV SOLN: 500.0000 mL | INTRAVENOUS | Status: DC

## 2012-09-23 NOTE — Op Note (Signed)
[facility] [address] [place], [address]   ENDOSCOPY PROCEDURE REPORT  PATIENT: Sheryl, Lester  MR#: [id number] BIRTHDATE: May 10, 1956 , 56  yrs. old GENDER: Female ENDOSCOPIST: Roxy Cedar, MD REFERRED BY:  .  Self / Office PROCEDURE DATE:  09/23/2012 PROCEDURE:  EGD, diagnostic ASA CLASS:     Class II INDICATIONS:  Dyspepsia.   Dysphagia. MEDICATIONS: MAC sedation, administered by CRNA and propofol (Diprivan) 100mg  IV TOPICAL ANESTHETIC: none  DESCRIPTION OF PROCEDURE: After the risks benefits and alternatives of the procedure were thoroughly explained, informed consent was obtained.  The LB [id number]-[id number] endoscope was introduced through the mouth and advanced to the second portion of the duodenum. Without limitations.  The instrument was slowly withdrawn as the mucosa was fully examined.      The proximal and midesophagus were unremarkable.  The distal esophagus revealed mild esophagitis as manifested by erythema, edema, and friability of the mucosal Z.  line.  The stomach was normal.  Retroflexed view revealed a small hiatal hernia.  The duodenal bulb and post bulbar duodenum were normal.         The scope was then withdrawn from the patient and the procedure completed.  COMPLICATIONS: There were no complications. ENDOSCOPIC IMPRESSION: 1. GERD with mild esophagitis 2. Otherwise normal examl.  RECOMMENDATIONS: 1. increase Prilosec from 20 mg daily to 40 mg daily  REPEAT EXAM:  eSigned:  Roxy Cedar, MD 09/23/2012 3:51 PM   XB:JYNWG Ellin Mayhew, MD and The Patient

## 2012-09-23 NOTE — Assessment & Plan Note (Signed)
Improved with irrigation 

## 2012-09-23 NOTE — Progress Notes (Signed)
Patient did not experience any of the following events: a burn prior to discharge; a fall within the facility; wrong site/side/patient/procedure/implant event; or a hospital transfer or hospital admission upon discharge from the facility. (G8907) Patient did not have preoperative order for IV antibiotic SSI prophylaxis. (G8918)  

## 2012-09-23 NOTE — Assessment & Plan Note (Signed)
stable overall by history and exam, recent data reviewed with pt, and pt to continue medical treatment as before,  to f/u any worsening symptoms or concerns Lab Results  Component Value Date   WBC 5.2 06/02/2012   HGB 11.2* 06/02/2012   HCT 33.8* 06/02/2012   PLT 289.0 06/02/2012   GLUCOSE 86 06/02/2012   CHOL 149 06/02/2012   TRIG 69.0 06/02/2012   HDL 64.40 06/02/2012   LDLDIRECT 123.1 07/03/2011   LDLCALC 71 06/02/2012   ALT 12 06/02/2012   AST 13 06/02/2012   NA 138 06/02/2012   K 4.5 06/02/2012   CL 101 06/02/2012   CREATININE 0.6 06/02/2012   BUN 13 06/02/2012   CO2 30 06/02/2012   TSH 0.75 06/02/2012

## 2012-09-23 NOTE — Assessment & Plan Note (Signed)
Appears to be venous ulceration mid left leg in the setting of chronic varicosities/venous insuff , currently healing, for compression stockings long term

## 2012-09-23 NOTE — Assessment & Plan Note (Addendum)
.  ECG reviewed as per emr, for norvasc 7.5 qd,  to f/u any worsening symptoms or concerns

## 2012-09-23 NOTE — Patient Instructions (Addendum)

## 2012-09-23 NOTE — Patient Instructions (Addendum)
OK to increase your amlodipine to 7.5 mg per day Please continue all other medications as before, and refills have been done if requested. Your right ear was irrigated of wax today You should consider wearing compression stocking knee high to the left leg, at the 20-30 strength daily to help with the vein swelling and tendency to ulcers Please keep your appointments with your specialists as you have planned - Dr Marina Goodell later today Thank you for enrolling in MyChart. Please follow the instructions below to securely access your online medical record. MyChart allows you to send messages to your doctor, view your test results, renew your prescriptions, schedule appointments, and more Please return in 6 months, or sooner if needed, with Lab testing done 3-5 days before

## 2012-09-23 NOTE — Progress Notes (Signed)
Subjective:    Patient ID: Sheryl Lester, female    DOB: 03/03/1956, 57 y.o.   MRN: [id number]  HPI  Here to f/u; has a skin problem with a small but healing ulcer to left medial leg in the setting of mild but chronic venoous insufficiency, no pain/red/swelling. BP at home has been frequenltly elev, often > 160, asks for increased amlodipine.  Has seen [facility] neurology, had CSF neg for lyme dz but still thought possible for lyme dz, planning for IV infusion recephin daily fo one mo, to be set up son. Has f/u appt with pain center soon.  Still having some problems with burning mouth, as well has chronic ear pain usually related with burning flares.  Has some ? Left ear wax.   For EGD later today.  Pt denies chest pain, increased sob or doe, wheezing, orthopnea, PND, increased LE swelling, palpitations, dizziness or syncope.   Pt denies polydipsia, polyuria. Has some decreased hearing right ear  ? Wax, over the past wk. Denies worsening depressive symptoms, suicidal ideation, or panic; has ongoing anxiety Past Medical History  Diagnosis Date  . ALLERGIC RHINITIS 01/20/2008  . ANEMIA-IRON DEFICIENCY 01/20/2008  . ANXIETY 01/20/2008  . ASTHMATIC BRONCHITIS, ACUTE 05/19/2008  . CENTRAL SLEEP APNEA CONDS CLASSIFIED ELSEWHERE 08/31/2010  . DEPRESSION 01/20/2008  . DYSPHAGIA UNSPECIFIED 12/06/2008  . ELEVATED BLOOD PRESSURE WITHOUT DIAGNOSIS OF HYPERTENSION 12/06/2008  . GERD 12/06/2008  . HYPERLIPIDEMIA 01/20/2008  . HYPERTENSION 12/16/2008  . OTITIS MEDIA, ACUTE, BILATERAL 12/16/2008  . OTITIS MEDIA, SEROUS, CHRONIC 12/30/2008  . PVD 05/12/2010  . SINUSITIS- ACUTE-NOS 09/21/2008  . SWELLING MASS OR LUMP IN HEAD AND NECK 09/21/2008  . TIA 12/16/2008  . Unspecified hearing loss 09/21/2008  . VENOUS INSUFFICIENCY, LEGS 05/03/2010  . THYROID NODULE, LEFT 01/07/2009  . Trigeminal neuralgia 01/20/2008  . Cancer 2012    melanoma   Past Surgical History  Procedure Date  . Abdominal hysterectomy   . Microvascular  decompression      for chronic facial pain/ [facility]  . Oophorectomy   . Appendectomy   . Breast surgery     Left breast biopsy, Benign  . Abdominal exploration surgery 07/1986    Hernia repair-femoral    reports that she has never smoked. She has never used smokeless tobacco. She reports that she does not drink alcohol or use illicit drugs. family history includes Breast cancer in her paternal aunt; Diabetes in her father; Heart disease in her other and paternal grandmother; Hyperlipidemia in her mother; Hypertension in her mother; and Melanoma in her father and sister. Allergies  Allergen Reactions  . Avapro (Irbesartan) Other (See Comments)    hyponatremia  . Sulfamethoxazole W-Trimethoprim    Current Outpatient Prescriptions on File Prior to Visit  Medication Sig Dispense Refill  . Armodafinil (NUVIGIL) 250 MG tablet Take 250 mg by mouth daily.      . Buprenorphine (BUTRANS) 20 MCG/HR PTWK Place onto the skin.        . clonazePAM (KLONOPIN) 0.5 MG tablet TAKE 1 TABLET BY MOUTH TWICE A DAY AS NEEDED  60 tablet  2  . omeprazole (PRILOSEC) 20 MG capsule Take 1 capsule (20 mg total) by mouth daily.  16 capsule  0  . protriptyline (VIVACTIL) 10 MG tablet TAKE 4 TABLETS BY MOUTH EVERY MORNING  120 tablet  3  . tapentadol (NUCYNTA) 50 MG TABS Take 75 mg by mouth every 4 (four) hours as needed.       . carbamazepine (  ANTIPSYCHOTIC - EQUETRO) 200 MG CP12 Take 300 mg by mouth 2 (two) times daily.       Current Facility-Administered Medications on File Prior to Visit  Medication Dose Route Frequency Provider Last Rate Last Dose  . 0.9 %  sodium chloride infusion  500 mL Intravenous Continuous Hilarie Fredrickson, MD       Review of Systems  Constitutional: Negative for unexpected weight change, or unusual diaphoresis  HENT: Negative for tinnitus.   Eyes: Negative for photophobia and visual disturbance.  Respiratory: Negative for choking and stridor.   Gastrointestinal: Negative for vomiting and  blood in stool.  Genitourinary: Negative for hematuria and decreased urine volume.  Musculoskeletal: Negative for acute joint swelling Skin: Negative for color change and wound.  Neurological: Negative for tremors and numbness other than noted  Psychiatric/Behavioral: Negative for decreased concentration or  hyperactivity.       Objective:   Physical Exam BP 116/80  Pulse 104  Temp 98.3 F (36.8 C) (Oral)  Ht 5\' 7"  (1.702 m)  Wt 138 lb 4 oz (62.71 kg)  BMI 21.65 kg/m2  SpO2 99% VS noted, not ill appaering Constitutional: Pt appears well-developed and well-nourished.  HENT: Head: NCAT.  Right Ear: External ear normal.  Left Ear: External ear normal.  Right canal clear after irrigation Eyes: Conjunctivae and EOM are normal. Pupils are equal, round, and reactive to light.  Neck: Normal range of motion. Neck supple.  Cardiovascular: Normal rate and regular rhythm.   Pulmonary/Chest: Effort normal and breath sounds normal.  Abd:  Soft, NT, non-distended, + BS Neurological: Pt is alert. Not confused  Skin: Skin is warm. No erythema. but has approx 3/4 cm superficial scabbed venous ulcer type lesion mid left medial calf with LLE below the knee with mult varicosities and chronic trace-1+ LLE edema Psychiatric: Pt behavior is normal. Thought content normal. 1-2+ nervous    Assessment & Plan:

## 2012-09-24 ENCOUNTER — Telehealth: Payer: Self-pay | Admitting: *Deleted

## 2012-09-24 NOTE — Telephone Encounter (Signed)
  Follow up Call-  Call back number 09/23/2012  Post procedure Call Back phone  # [phone]  Permission to leave phone message Yes     Patient questions:  Do you have a fever, pain , or abdominal swelling? no Pain Score  0 *  Have you tolerated food without any problems? yes  Have you been able to return to your normal activities? yes  Do you have any questions about your discharge instructions: Diet   no Medications  no Follow up visit  no  Do you have questions or concerns about your Care? no  Actions: * If pain score is 4 or above: No action needed, pain <4.

## 2012-10-11 ENCOUNTER — Other Ambulatory Visit: Payer: Self-pay

## 2012-10-20 ENCOUNTER — Telehealth: Payer: Self-pay | Admitting: Internal Medicine

## 2012-10-20 NOTE — Telephone Encounter (Signed)
Spoke with pt and she had a difficult time swallowing some rice. States that when she did get it to go down she had some soreness in the right side of her throat. Discussed with pt that rice can be difficult to swallow sometimes, she should eat soft foods and liquids the rest of the evening. Pt wanted to know if her EGD showed any problems other than esophagitis. Reviewed results with pt and that she is supposed to be taking her PPI BID. Pt verbalized understanding. Pt instructed to call back if she had any further problems. Dr. Marina Goodell notified.

## 2012-10-30 ENCOUNTER — Other Ambulatory Visit: Payer: Self-pay | Admitting: Internal Medicine

## 2012-11-07 ENCOUNTER — Other Ambulatory Visit: Payer: Self-pay | Admitting: Internal Medicine

## 2012-11-07 ENCOUNTER — Telehealth: Payer: Self-pay | Admitting: Internal Medicine

## 2012-11-07 MED ORDER — RABEPRAZOLE SODIUM 20 MG PO TBEC: 20.0000 mg | DELAYED_RELEASE_TABLET | Freq: Every day | ORAL | Status: DC

## 2012-11-07 NOTE — Telephone Encounter (Signed)
Left message for pt to call back.  Pt states that she has been taking omeprazole 40mg  daily and she is still having problems with reflux. Pt states she was visiting her father and he takes aciphex. He gave her some to try and pt states that it has worked really well. Pt is requesting a prescription for aciphex. Dr. Marina Goodell please advise.

## 2012-11-07 NOTE — Telephone Encounter (Signed)
That's fine. Aciphex 20 mg q d

## 2012-11-07 NOTE — Telephone Encounter (Signed)
Pt aware and script sent to the pharmacy. 

## 2012-11-14 ENCOUNTER — Telehealth: Payer: Self-pay | Admitting: Internal Medicine

## 2012-11-14 DIAGNOSIS — G43919 Migraine, unspecified, intractable, without status migrainosus: Secondary | ICD-10-CM | POA: Insufficient documentation

## 2012-11-14 NOTE — Telephone Encounter (Signed)
Pt states she had a little difficulty swallowing and she felt like she could not breathe and that she was going to pass out. Pt state she has a history of panic attacks and she thinks that was what was happening. Pt states she has Klonopin that she takes for the panic attacks. Pt states she feels fine now, she just wanted to know if we had a "quick fix" for the panic attack. Discussed with pt that she could try deep breathing and trying to relax along with her Klonopin.

## 2012-11-20 ENCOUNTER — Telehealth: Payer: Self-pay | Admitting: Internal Medicine

## 2012-11-20 MED ORDER — AMLODIPINE BESYLATE 10 MG PO TABS: 10.0000 mg | ORAL_TABLET | Freq: Every day | ORAL | Status: DC

## 2012-11-20 NOTE — Telephone Encounter (Signed)
In her case, we should go to the 10 mg - done erx

## 2012-11-20 NOTE — Telephone Encounter (Signed)
Pt states that she is having some difficulty swallowing, states she thinks she may be aspirating small bits of food. Pt also wants to know if she can get a prescription doubling the dose of aciphex. Offered pt an appt for today but she states she does not have a car today. Pt wants an appt early next week. Pt scheduled to see Mike Gip PA Monday 11/24/12@9 :30am. Pt aware of appt date and time.

## 2012-11-20 NOTE — Telephone Encounter (Signed)
Caller: Sheryl Lester/Patient; Phone: [phone]; Reason for Call: Patient calling about amlodipine medication for blood pressure.  States was started on the prescription, norvasc 5mg , take 1 every day, and that was not working well for her, and was told to increase to 1-1/2 tabs q AM.  States she has done that, and now is out of medication, and her insurance company will not allow a refill, as it is too early.  BP has continued to be elevated, and wonders also if she should try a new medication.  BP 11/18/12 was 156/110.  Declines new triage; info to office for provider review/Rx changes/renewal/callback.  [facility]--prefers NOT a 90-day refill until sure medication will work.  May reach patient at [phone].

## 2012-11-20 NOTE — Telephone Encounter (Signed)
Patient informed. 

## 2012-11-24 ENCOUNTER — Encounter: Payer: Self-pay | Admitting: Physician Assistant

## 2012-11-24 ENCOUNTER — Other Ambulatory Visit (HOSPITAL_COMMUNITY): Payer: Self-pay | Admitting: Physician Assistant

## 2012-11-24 ENCOUNTER — Ambulatory Visit (INDEPENDENT_AMBULATORY_CARE_PROVIDER_SITE_OTHER): Payer: PRIVATE HEALTH INSURANCE | Admitting: Physician Assistant

## 2012-11-24 ENCOUNTER — Other Ambulatory Visit: Payer: Self-pay

## 2012-11-24 VITALS — BP 150/80 | HR 70 | Ht 66.5 in | Wt 138.8 lb

## 2012-11-24 DIAGNOSIS — K219 Gastro-esophageal reflux disease without esophagitis: Secondary | ICD-10-CM

## 2012-11-24 DIAGNOSIS — R1319 Other dysphagia: Secondary | ICD-10-CM

## 2012-11-24 DIAGNOSIS — Z1231 Encounter for screening mammogram for malignant neoplasm of breast: Secondary | ICD-10-CM

## 2012-11-24 DIAGNOSIS — R1310 Dysphagia, unspecified: Secondary | ICD-10-CM

## 2012-11-24 MED ORDER — RABEPRAZOLE SODIUM 20 MG PO TBEC: 40.0000 mg | DELAYED_RELEASE_TABLET | Freq: Every day | ORAL | Status: DC

## 2012-11-24 NOTE — Patient Instructions (Addendum)
We scheduled the Speech Pathology test at [facility] , 1st floor Radiology.   Arrive at 12:45 on Thurs 11-27-2012.   You can have breakfast and lunch.  Take your medications as usual.  Take the Aciphex 40 mg daily.

## 2012-11-24 NOTE — Progress Notes (Signed)
Agree with plans for Modified Ba swallow / speech pathology

## 2012-11-24 NOTE — Progress Notes (Signed)
Subjective:    Patient ID: Sheryl Lester, female    DOB: 01/21/1956, 57 y.o.   MRN: [id number]  HPI Sheryl Lester is a pleasant 57 year old white female known to Dr. Marina Goodell who was recently seen for complaints of dysphagia. She has a chronic trigeminal neuralgia which is now bilateral and a chronic pain syndrome secondary to above requiring chronic narcotics. This is been associated with depression and anxiety. She has been undergoing evaluation at [facility] through a neurologist there because of progression of her neuralgia symptoms. She reports that she may have a late stage Lyme disease but this has not been proven area she did take a two-month course of oral antibiotics in December and was considering a prolonged course of IV antibiotics however she cannot get this covered by her insurance . She has chronic constant pain in her face. She underwent upper endoscopy on 09/23/2012 which showed evidence of mild distal esophagitis, there was no evidence of esophageal stricture. She is currently on 40 mg of AcipHex by mouth daily which works well to control her reflux symptoms. She comes back in today stating that she's been having episodes of aspiration and says she's had 2 or 3 episodes over the past month. She says is always occurs with solid food in the last 2 times have been with rice. He says the last episode was associated with some sharp pain on in her chest and prolonged coughing. She does not feel that she has much difficulty with liquids area she does feel that she's having some difficulty transferring food bolus from the back of her mouth into her pharynx. She is not having any sensation of food sticking in her chest or esophagus . He does have occasional breakthrough heartburn symptoms despite the 40 mg of AcipHex. She also relates episodes which she feels are panic attacks where she feels that momentarily her breathing is cut off with swallowing and this is associated with significant  anxiety    Review of Systems  Constitutional: Negative.   HENT: Positive for trouble swallowing.   Eyes: Negative.   Respiratory: Negative.   Cardiovascular: Negative.   Gastrointestinal: Negative.   Endocrine: Negative.   Musculoskeletal: Negative.   Skin: Negative.   Allergic/Immunologic: Negative.   Neurological: Negative.   Hematological: Negative.   Psychiatric/Behavioral: Positive for dysphoric mood. The patient is nervous/anxious.    Outpatient Prescriptions Prior to Visit  Medication Sig Dispense Refill  . amLODipine (NORVASC) 10 MG tablet Take 1 tablet (10 mg total) by mouth daily.  30 tablet  11  . Armodafinil (NUVIGIL) 250 MG tablet Take 250 mg by mouth daily.      . Buprenorphine (BUTRANS) 20 MCG/HR PTWK Place onto the skin.        . carbamazepine (ANTIPSYCHOTIC - EQUETRO) 200 MG CP12 Take 300 mg by mouth 2 (two) times daily.      . clonazePAM (KLONOPIN) 0.5 MG tablet TAKE 1 TABLET BY MOUTH TWICE A DAY AS NEEDED  60 tablet  2  . protriptyline (VIVACTIL) 10 MG tablet TAKE 4 TABLETS BY MOUTH EVERY MORNING  120 tablet  3  . tapentadol (NUCYNTA) 50 MG TABS Take 75 mg by mouth every 4 (four) hours as needed.       . RABEprazole (ACIPHEX) 20 MG tablet Take 1 tablet (20 mg total) by mouth daily.  30 tablet  3  . omeprazole (PRILOSEC) 20 MG capsule Take 1 capsule (20 mg total) by mouth daily.  16 capsule  0  .  protriptyline (VIVACTIL) 10 MG tablet TAKE 4 TABLETS BY MOUTH EVERY MORNING  120 tablet  3   No facility-administered medications prior to visit.   Allergies  Allergen Reactions  . Avapro (Irbesartan) Other (See Comments)    hyponatremia  . Sulfamethoxazole W-Trimethoprim    Patient Active Problem List  Diagnosis  . THYROID NODULE, LEFT  . HYPERLIPIDEMIA  . ANEMIA-IRON DEFICIENCY  . ANXIETY  . DEPRESSION  . Primary central sleep apnea  . Trigeminal neuralgia  . OTITIS MEDIA, SEROUS, CHRONIC  . Unspecified hearing loss  . HYPERTENSION  . TIA  . PVD  .  VENOUS INSUFFICIENCY, LEGS  . ALLERGIC RHINITIS  . GERD  . Flushing  . SWELLING MASS OR LUMP IN HEAD AND NECK  . DYSPHAGIA  . CENTRAL SLEEP APNEA CONDS CLASSIFIED ELSEWHERE  . Preventative health care  . Facial skin lesion  . Melanoma  . Burning sensation of mouth  . Foot abscess, right  . Odynophagia  . Dysphagia  . Skin lesion  . Hearing loss   History  Substance Use Topics  . Smoking status: Never Smoker   . Smokeless tobacco: Never Used  . Alcohol Use: No   family history includes Breast cancer in her paternal aunt; Diabetes in her father; Heart disease in her other and paternal grandmother; Hyperlipidemia in her mother; Hypertension in her mother; and Melanoma in her father and sister.  There is no history of Colon cancer.     Objective:   Physical Exam well-developed thin anxious appearing white female in no acute distress blood pressure 150/80 pulse 70 height 5 foot 6 weight 138.  HEENT; nontraumatic, normocephalic EOMI PERRLA ,sclera anicteric, Neck;Supple no JVD, Cardiovascular; regular rate and rhythm with S1-S2 no murmur rub or gallop, Pulmonary; clear bilaterally, Abdomen; soft nontender nondistended bowel sounds are active there is no palpable mass or hepatosplenomegaly, Rectal; exam not done, Extremities no clubbing cyanosis or edema skin warm and dry, Psych; mood and affect appropriate        Assessment & Plan:  #36 57 year old female with chronic GERD, and a trigeminal neuralgia which has now become bilateral, of unclear etiology. Patient with new complaints of intermittent aspiration and what sounds like a transfer dysphagia . She does not have esophageal dysphasia, will need to rule out a neurogenic dysphagia with secondary aspiration. #2 other problems as outlined above  Plan; continue AcipHex 40 mg by mouth every morning Schedule for speech pathology evaluation with modified swallowing study. Further plans pending results of above.

## 2012-11-27 ENCOUNTER — Ambulatory Visit (HOSPITAL_COMMUNITY)
Admission: RE | Admit: 2012-11-27 | Discharge: 2012-11-27 | Disposition: A | Discharge status: Home / Self Care | Payer: PRIVATE HEALTH INSURANCE | Source: Ambulatory Visit | Attending: Physician Assistant | Admitting: Physician Assistant

## 2012-11-27 DIAGNOSIS — R1310 Dysphagia, unspecified: Secondary | ICD-10-CM

## 2012-11-27 DIAGNOSIS — R1319 Other dysphagia: Secondary | ICD-10-CM | POA: Insufficient documentation

## 2012-11-27 NOTE — Procedures (Signed)
Objective Swallowing Evaluation: Modified Barium Swallowing Study  Patient Details  Name: Sheryl Lester MRN: [id number] Date of Birth: 12-21-1955  Today's Date: 11/27/2012 Time: 4098-1191 SLP Time Calculation (min): 29 min  Past Medical History:  Past Medical History  Diagnosis Date  . ALLERGIC RHINITIS 01/20/2008  . ANEMIA-IRON DEFICIENCY 01/20/2008  . ANXIETY 01/20/2008  . ASTHMATIC BRONCHITIS, ACUTE 05/19/2008  . CENTRAL SLEEP APNEA CONDS CLASSIFIED ELSEWHERE 08/31/2010  . DEPRESSION 01/20/2008  . DYSPHAGIA UNSPECIFIED 12/06/2008  . ELEVATED BLOOD PRESSURE WITHOUT DIAGNOSIS OF HYPERTENSION 12/06/2008  . GERD 12/06/2008  . HYPERLIPIDEMIA 01/20/2008  . HYPERTENSION 12/16/2008  . OTITIS MEDIA, ACUTE, BILATERAL 12/16/2008  . OTITIS MEDIA, SEROUS, CHRONIC 12/30/2008  . PVD 05/12/2010  . SINUSITIS- ACUTE-NOS 09/21/2008  . SWELLING MASS OR LUMP IN HEAD AND NECK 09/21/2008  . TIA 12/16/2008  . Unspecified hearing loss 09/21/2008  . VENOUS INSUFFICIENCY, LEGS 05/03/2010  . THYROID NODULE, LEFT 01/07/2009  . Trigeminal neuralgia 01/20/2008  . Cancer 2012    melanoma   Past Surgical History:  Past Surgical History  Procedure Laterality Date  . Abdominal hysterectomy    . Microvascular decompression       for chronic facial pain/ [facility]  . Oophorectomy    . Appendectomy    . Breast surgery      Left breast biopsy, Benign  . Abdominal exploration surgery  07/1986    Hernia repair-femoral   HPI:  57 yo female referred by Mike Gip for MBS due to pt complaint of dysphagia and concern for transfer deficits.  PMH + for atypical trigeminal neuralgia (bilateral) that started on right side approx 13 years ago, chronic pain, ? Lyme disease s/p two months of Antibiiotic tx - unable to pursue further tx due to insurance issues.  Xerostomia also reported by pt for which she uses Biotene.  Pt reports problems with burning sensation in her face that has improved since taking Aciphex 40 mg BID over the last two  weeks.  Pt continues to complaint of "choking" sensation that requires her to consume liquid to clear.  Per clinical referral form, pt also reports continued neurological dysfunction and neuropathy that has not been diagnosed.   Hoarseness reported by pt x7 months.  She denies weight loss nor pulmonary infections.       Assessment / Plan / Recommendation Clinical Impression  Dysphagia Diagnosis: Mild oral phase dysphagia;Mild pharyngeal phase dysphagia Clinical impression:   Pt presents with minimal oropharyngeal dysphagia without aspiration or penetration of any consistency tested.  Suspect pt's xerostomia and trigeminal neuralgia as significant factors for her minimal dysphagia resulting in decreased intraoral pressure as boluses adhere to dry oral tissue.   Mild vallecular stasis noted with dry cracker that cleared with liquid and dry swallows.   No stasis noted with liquids and laryngeal elevation/closure was normal.    Pt's main complaint of throat tightness does not appear to be related to oropharyngeal swallow function.    SLP provided education re: reflux precautions *given pt on Aciphex and compensatory strategies for her xerostomia and minimal oropharyngeal deficits.    Thanks for this consult.      Treatment Recommendation    n/a defer to GI MD   Diet Recommendation Regular;Thin liquid (extra gravy and sauces)   Liquid Administration via: Cup;Straw Medication Administration: Whole meds with liquid Supervision: Patient able to self feed Compensations: Slow rate;Small sips/bites;Follow solids with liquid (start meal with liquids, follow solid with liquids) Postural Changes and/or Swallow Maneuvers: Seated upright 90  degrees;Upright 30-60 min after meal (intermittent dry swallow)    Other  Recommendations Oral Care Recommendations: Oral care QID   Follow Up Recommendations  None    Frequency and Duration        Pertinent Vitals/Pain Appeared comfortable, no increased work of  breathing    SLP Swallow Goals     General Date of Onset: 11/27/12 HPI: 57 yo female referred by Mike Gip for MBS due to pt complaint of dysphagia and concern for transfer deficits.  PMH + for atypical trigeminal neuralgia (bilateral) that started on right side approx 13 years ago, chronic pain, ? Lyme disease s/p two months of Antibiiotic tx - unable to pursue further tx due to insurance issues.  Xerostomia also reported by pt for which she uses Biotene.  Pt reports problems with burning sensation in her face that has improved since taking Aciphex 40 mg BID over the last two weeks.  Pt continues to complaint of "choking" sensation that requires her to consume liquid to clear.  Per clinical referral form, pt also reports continued neurological dysfunction and neuropathy that has not been diagnosed.   Hoarseness reported by pt x7 months.  She denies weight loss nor pulmonary infections.   Type of Study: Modified Barium Swallowing Study Reason for Referral: Objectively evaluate swallowing function Diet Prior to this Study: Regular;Thin liquids Temperature Spikes Noted: No Respiratory Status: Room air Behavior/Cognition: Alert;Cooperative;Pleasant mood Oral Cavity - Dentition: Adequate natural dentition Oral Motor / Sensory Function: Within functional limits Self-Feeding Abilities: Able to feed self Patient Positioning: Upright in chair Baseline Vocal Quality: Clear Volitional Cough: Strong Volitional Swallow: Able to elicit Anatomy: Within functional limits Pharyngeal Secretions: Not observed secondary MBS    Reason for Referral Objectively evaluate swallowing function   Oral Phase Oral - Nectar Oral - Nectar Cup: Within functional limits Oral - Thin Oral - Thin Cup: Within functional limits Oral - Thin Straw: Within functional limits Oral - Solids Oral - Puree: Within functional limits Oral - Regular: Reduced posterior propulsion;Piecemeal swallowing Oral - Pill: Lingual/palatal  residue   Pharyngeal Phase Pharyngeal - Nectar Pharyngeal - Nectar Cup: Within functional limits Pharyngeal - Thin Pharyngeal - Thin Cup: Within functional limits Pharyngeal - Thin Straw: Within functional limits Pharyngeal - Solids Pharyngeal - Puree: Within functional limits Pharyngeal - Regular: Pharyngeal residue - valleculae;Pharyngeal residue - posterior pharnyx;Compensatory strategies attempted (Comment) (liquid swallow aided clearance) Pharyngeal - Pill: Within functional limits  Cervical Esophageal Phase    GO    Cervical Esophageal Phase Cervical Esophageal Phase: [facility] Cervical Esophageal Phase - Comment Cervical Esophageal Comment: Appearance of delayed clearance with solids/pureed more than liquids with referrant sensation to pharynx x1, liquid swallows aid clearance, barium tablet appeared to lodge at distal esophagus - liquids facilitated clearance.  Radiologis not present to confirm findings.     Functional Assessment Tool Used: mbs, clinical judgement Functional Limitations: Swallowing Swallow Current Status (Z6109): At least 1 percent but less than 20 percent impaired, limited or restricted Swallow Goal Status [phone]): At least 1 percent but less than 20 percent impaired, limited or restricted Swallow Discharge Status [phone]): At least 1 percent but less than 20 percent impaired, limited or restricted   Donavan Burnet, MS [facility] SLP [phone]

## 2012-11-28 ENCOUNTER — Ambulatory Visit
Admission: RE | Admit: 2012-11-28 | Discharge: 2012-11-28 | Disposition: A | Discharge status: Home / Self Care | Payer: PRIVATE HEALTH INSURANCE | Source: Ambulatory Visit

## 2012-11-28 DIAGNOSIS — Z1231 Encounter for screening mammogram for malignant neoplasm of breast: Secondary | ICD-10-CM

## 2012-12-08 LAB — HM MAMMOGRAPHY

## 2012-12-19 ENCOUNTER — Ambulatory Visit: Payer: PRIVATE HEALTH INSURANCE | Admitting: Internal Medicine

## 2012-12-23 ENCOUNTER — Ambulatory Visit (INDEPENDENT_AMBULATORY_CARE_PROVIDER_SITE_OTHER): Payer: PRIVATE HEALTH INSURANCE | Admitting: Internal Medicine

## 2012-12-23 ENCOUNTER — Encounter: Payer: Self-pay | Admitting: Internal Medicine

## 2012-12-23 VITALS — BP 120/83 | HR 111 | Temp 98.3°F | Ht 67.0 in | Wt 135.8 lb

## 2012-12-23 DIAGNOSIS — M25522 Pain in left elbow: Secondary | ICD-10-CM | POA: Insufficient documentation

## 2012-12-23 DIAGNOSIS — M899 Disorder of bone, unspecified: Secondary | ICD-10-CM

## 2012-12-23 DIAGNOSIS — M25539 Pain in unspecified wrist: Secondary | ICD-10-CM

## 2012-12-23 DIAGNOSIS — M949 Disorder of cartilage, unspecified: Secondary | ICD-10-CM

## 2012-12-23 DIAGNOSIS — M8580 Other specified disorders of bone density and structure, unspecified site: Secondary | ICD-10-CM | POA: Insufficient documentation

## 2012-12-23 DIAGNOSIS — I10 Essential (primary) hypertension: Secondary | ICD-10-CM

## 2012-12-23 DIAGNOSIS — M25531 Pain in right wrist: Secondary | ICD-10-CM | POA: Insufficient documentation

## 2012-12-23 DIAGNOSIS — M25529 Pain in unspecified elbow: Secondary | ICD-10-CM

## 2012-12-23 NOTE — Assessment & Plan Note (Signed)
stable overall by history and exam, recent data reviewed with pt, and pt to continue medical treatment as before,  to f/u any worsening symptoms or concerns BP Readings from Last 3 Encounters:  12/23/12 120/83  11/24/12 150/80  09/23/12 135/87

## 2012-12-23 NOTE — Assessment & Plan Note (Signed)
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

## 2012-12-23 NOTE — Progress Notes (Signed)
Subjective:    Patient ID: Sheryl Lester, female    DOB: [birth year], 57 y.o.   MRN: [id number]  HPI  Here to f/u,   Recalls a low sodium with ?avapro tx last yr, stopped per [facility] MD and low sodium resolved.  BP elevated yesterday, but today ok. Doe shave some left elbow pain, tender to touch , mild intermittent with some radiation to the LUE distal but but weakness/numbness.  Also with mild right first dorsal compartment tender and pain worse to touch and flex the thumb, o/w neurovasc intact.  Pt denies chest pain, increased sob or doe, wheezing, orthopnea, PND, increased LE swelling, palpitations, dizziness or syncope.  Pt denies new neurological symptoms such as new headache, or facial or extremity weakness or numbness   Pt denies polydipsia, polyuria, or low sugar symptoms such as weakness or confusion improved with po intake.  Pt states overall good compliance with meds, trying to follow lower cholesterol, diabetic diet, wt overall stable but little exercise however.     Plans to apply for disability soon, saw lawyer and for admin hearing with judge in approx 8-12 mo.   Pt denies fever, wt loss, night sweats, loss of appetite, or other constitutional symptoms Did have LP on Aug 28, 2012 at [facility] - normal per pt, had been taking minocycline for 2 mo just prior.  Was intended to get IV rocephin but not able due to insurance declined for this.  Did see GYN, but told she should not take HRT due to hx of migraine as a young adult, and cardiac risk now.  Did have some topical vaginal Hormone tx.  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 ALLERGIC RHINITIS 01/20/2008  . ANEMIA-IRON DEFICIENCY 01/20/2008  . ANXIETY 01/20/2008  . ASTHMATIC BRONCHITIS, ACUTE 05/19/2008  . CENTRAL SLEEP APNEA CONDS CLASSIFIED ELSEWHERE 08/31/2010  . DEPRESSION 01/20/2008  . DYSPHAGIA UNSPECIFIED 12/06/2008  .  ELEVATED BLOOD PRESSURE WITHOUT DIAGNOSIS OF HYPERTENSION 12/06/2008  . GERD 12/06/2008  . HYPERLIPIDEMIA 01/20/2008  . HYPERTENSION 12/16/2008  . OTITIS MEDIA, ACUTE, BILATERAL 12/16/2008  . OTITIS MEDIA, SEROUS, CHRONIC 12/30/2008  . PVD 05/12/2010  . SINUSITIS- ACUTE-NOS 09/21/2008  . SWELLING MASS OR LUMP IN HEAD AND NECK 09/21/2008  . TIA 12/16/2008  . Unspecified hearing loss 09/21/2008  . VENOUS INSUFFICIENCY, LEGS 05/03/2010  . THYROID NODULE, LEFT 01/07/2009  . Trigeminal neuralgia 01/20/2008  . Cancer 2012    melanoma   Past Surgical History  Procedure Laterality Date  . Abdominal hysterectomy    . Microvascular decompression       for chronic facial pain/ [facility]  . Oophorectomy    . Appendectomy    . Breast surgery      Left breast biopsy, Benign  . Abdominal exploration surgery  07/1986    Hernia repair-femoral    reports that she has never smoked. She has never used smokeless tobacco. She reports that she does not drink alcohol or use illicit drugs. family history includes Breast cancer in her paternal aunt; Diabetes in her father; Heart disease in her other and paternal grandmother; Hyperlipidemia in her mother; Hypertension in her mother; and Melanoma in her father and sister.  There is no history of Colon cancer. Allergies  Allergen Reactions  . Avapro (Irbesartan) Other (See Comments)    hyponatremia  . Sulfamethoxazole W-Trimethoprim    Current  Outpatient Prescriptions on File Prior to Visit  Medication Sig Dispense Refill  . amLODipine (NORVASC) 10 MG tablet Take 1 tablet (10 mg total) by mouth daily.  30 tablet  11  . Armodafinil (NUVIGIL) 250 MG tablet Take 250 mg by mouth daily.      . Buprenorphine (BUTRANS) 20 MCG/HR PTWK Place onto the skin.        . carbamazepine (ANTIPSYCHOTIC - EQUETRO) 200 MG CP12 Take 300 mg by mouth 2 (two) times daily.      . clonazePAM (KLONOPIN) 0.5 MG tablet TAKE 1 TABLET BY MOUTH TWICE A DAY AS NEEDED  60 tablet  2  . Esomeprazole  Magnesium (NEXIUM PO) Take by mouth as needed.      . protriptyline (VIVACTIL) 10 MG tablet TAKE 4 TABLETS BY MOUTH EVERY MORNING  120 tablet  3  . RABEprazole (ACIPHEX) 20 MG tablet Take 2 tablets (40 mg total) by mouth daily.  60 tablet  2  . tapentadol (NUCYNTA) 50 MG TABS Take 75 mg by mouth every 4 (four) hours as needed.        No current facility-administered medications on file prior to visit.   Review of Systems  Constitutional: Negative for unexpected weight change, or unusual diaphoresis  HENT: Negative for tinnitus.   Eyes: Negative for photophobia and visual disturbance.  Respiratory: Negative for choking and stridor.   Gastrointestinal: Negative for vomiting and blood in stool.  Genitourinary: Negative for hematuria and decreased urine volume.  Musculoskeletal: Negative for acute joint swelling Skin: Negative for color change and wound.  Neurological: Negative for tremors and numbness other than noted  Psychiatric/Behavioral: Negative for decreased concentration or  hyperactivity.       Objective:   Physical Exam BP 120/83  Pulse 111  Temp(Src) 98.3 F (36.8 C) (Oral)  Ht 5\' 7"  (1.702 m)  Wt 135 lb 12 oz (61.576 kg)  BMI 21.26 kg/m2  SpO2 96% VS noted,  Constitutional: Pt appears well-developed and well-nourished.  HENT: Head: NCAT.  Right Ear: External ear normal.  Left Ear: External ear normal.  Eyes: Conjunctivae and EOM are normal. Pupils are equal, round, and reactive to light.  Neck: Normal range of motion. Neck supple.  Cardiovascular: Normal rate and regular rhythm.   Pulmonary/Chest: Effort normal and breath sounds normal.  Abd:  Soft, NT, non-distended, + BS Neurological: Pt is alert. Not confused  Skin: Skin is warm. No erythema.  Psychiatric: Pt behavior is normal. Thought content normal. 2+ nervous     Assessment & Plan:

## 2012-12-23 NOTE — Assessment & Plan Note (Signed)
Due for f/u dxa, will order

## 2012-12-23 NOTE — Patient Instructions (Addendum)
Please continue all other medications as before, and refills have been done if requested. Please have the pharmacy call with any other refills you may need. You will be contacted regarding the referral for: hand surgeon for the right thumb and wrist tendonitis Your Username has been sent to your email, so that you can re-set your Password for Mychart Online If you do not have a valid e-mail address on file, you will have to contact our MyChart help desk at [phone]) to help you regain access to your MyChart account. Thank you for enrolling in MyChart. Please follow the instructions below to securely access your online medical record. MyChart allows you to send messages to your doctor, view your test results, renew your prescriptions, schedule appointments, and more. Please return in July 2014, or sooner if needed, with Lab testing done 3-5 days before

## 2012-12-23 NOTE — Assessment & Plan Note (Signed)
C/w dequervains, for ortho referral

## 2012-12-26 ENCOUNTER — Inpatient Hospital Stay: Admission: RE | Admit: 2012-12-26 | Payer: PRIVATE HEALTH INSURANCE | Source: Ambulatory Visit

## 2013-01-02 ENCOUNTER — Other Ambulatory Visit: Payer: PRIVATE HEALTH INSURANCE

## 2013-01-05 ENCOUNTER — Other Ambulatory Visit: Payer: PRIVATE HEALTH INSURANCE

## 2013-01-08 ENCOUNTER — Ambulatory Visit (INDEPENDENT_AMBULATORY_CARE_PROVIDER_SITE_OTHER)
Admission: RE | Admit: 2013-01-08 | Discharge: 2013-01-08 | Disposition: A | Discharge status: Home / Self Care | Payer: PRIVATE HEALTH INSURANCE | Source: Ambulatory Visit | Attending: Internal Medicine | Admitting: Internal Medicine

## 2013-01-08 DIAGNOSIS — M949 Disorder of cartilage, unspecified: Secondary | ICD-10-CM

## 2013-01-08 DIAGNOSIS — M8580 Other specified disorders of bone density and structure, unspecified site: Secondary | ICD-10-CM

## 2013-01-20 ENCOUNTER — Other Ambulatory Visit: Payer: Self-pay | Admitting: Internal Medicine

## 2013-01-20 NOTE — Telephone Encounter (Signed)
Done hardcopy to robin  

## 2013-01-21 NOTE — Telephone Encounter (Signed)
Faxed hardcopy to [facility] [place]

## 2013-02-03 ENCOUNTER — Encounter: Payer: Self-pay | Admitting: Internal Medicine

## 2013-02-03 ENCOUNTER — Ambulatory Visit (INDEPENDENT_AMBULATORY_CARE_PROVIDER_SITE_OTHER): Payer: PRIVATE HEALTH INSURANCE | Admitting: Internal Medicine

## 2013-02-03 VITALS — BP 140/98 | HR 105 | Temp 97.8°F | Ht 67.0 in | Wt 140.1 lb

## 2013-02-03 DIAGNOSIS — I10 Essential (primary) hypertension: Secondary | ICD-10-CM

## 2013-02-03 DIAGNOSIS — R202 Paresthesia of skin: Secondary | ICD-10-CM

## 2013-02-03 DIAGNOSIS — R209 Unspecified disturbances of skin sensation: Secondary | ICD-10-CM

## 2013-02-03 DIAGNOSIS — G500 Trigeminal neuralgia: Secondary | ICD-10-CM

## 2013-02-03 DIAGNOSIS — F411 Generalized anxiety disorder: Secondary | ICD-10-CM

## 2013-02-03 MED ORDER — METOPROLOL TARTRATE 50 MG PO TABS: 50.0000 mg | ORAL_TABLET | Freq: Two times a day (BID) | ORAL | Status: DC

## 2013-02-03 NOTE — Patient Instructions (Signed)
Please take all new medication as prescribed - take the metoprolol at HALF pill twice per day for 1 wk, then whole pill twice per day after that Please continue all other medications as before Please have the pharmacy call with any other refills you may need.  Please return in 1 month, or sooner if needed, with Lab testing done 3-5 days before

## 2013-02-03 NOTE — Progress Notes (Signed)
Subjective:    Patient ID: Sheryl Lester, female    DOB: [birth year], 57 y.o.   MRN: [id number]  HPI  Here to f/u, sistes is an MD (psychiatry) whom she visited over the weekend and had several BP 170/111, 165/108.  Pt denies chest pain, increased sob or doe, wheezing, orthopnea, PND, increased LE swelling, palpitations, dizziness or syncope.  Pt denies new neurological symptoms such as new headache, or facial or extremity weakness or numbness   Pt denies polydipsia, polyuria, Pt states overall good compliance with meds. Remains very concerned about the possiblity of "late stage lyme disease", laments the fact she cannot seem to get the diagnosis proven or tx, and is convinced she has right wrist, left elbow pains and various paresthesias getting worse and she will be in a wheelchair in 10 yrs.  Currently applying for disability, states trigeminal neuralgia now seems to involve both sides of face.  Has been worseing anxiety and sleepwalking recently.  States takes butrans well, but the nucynta prn makes her loopy, and this is known by her pain MD  Past Medical History  Diagnosis Date  . ALLERGIC RHINITIS 01/20/2008  . ANEMIA-IRON DEFICIENCY 01/20/2008  . ANXIETY 01/20/2008  . ASTHMATIC BRONCHITIS, ACUTE 05/19/2008  . CENTRAL SLEEP APNEA CONDS CLASSIFIED ELSEWHERE 08/31/2010  . DEPRESSION 01/20/2008  . DYSPHAGIA UNSPECIFIED 12/06/2008  . ELEVATED BLOOD PRESSURE WITHOUT DIAGNOSIS OF HYPERTENSION 12/06/2008  . GERD 12/06/2008  . HYPERLIPIDEMIA 01/20/2008  . HYPERTENSION 12/16/2008  . OTITIS MEDIA, ACUTE, BILATERAL 12/16/2008  . OTITIS MEDIA, SEROUS, CHRONIC 12/30/2008  . PVD 05/12/2010  . SINUSITIS- ACUTE-NOS 09/21/2008  . SWELLING MASS OR LUMP IN HEAD AND NECK 09/21/2008  . TIA 12/16/2008  . Unspecified hearing loss 09/21/2008  . VENOUS INSUFFICIENCY, LEGS 05/03/2010  . THYROID NODULE, LEFT 01/07/2009  . Trigeminal neuralgia 01/20/2008  . Cancer 2012    melanoma   Past Surgical History  Procedure Laterality  Date  . Abdominal hysterectomy    . Microvascular decompression       for chronic facial pain/ [facility]  . Oophorectomy    . Appendectomy    . Breast surgery      Left breast biopsy, Benign  . Abdominal exploration surgery  07/1986    Hernia repair-femoral    reports that she has never smoked. She has never used smokeless tobacco. She reports that she does not drink alcohol or use illicit drugs. family history includes Breast cancer in her paternal aunt; Diabetes in her father; Heart disease in her other and paternal grandmother; Hyperlipidemia in her mother; Hypertension in her mother; and Melanoma in her father and sister.  There is no history of Colon cancer. Allergies  Allergen Reactions  . Avapro (Irbesartan) Other (See Comments)    hyponatremia  . Sulfamethoxazole W-Trimethoprim    Current Outpatient Prescriptions on File Prior to Visit  Medication Sig Dispense Refill  . amLODipine (NORVASC) 10 MG tablet Take 1 tablet (10 mg total) by mouth daily.  30 tablet  11  . Armodafinil (NUVIGIL) 250 MG tablet Take 250 mg by mouth daily.      . Buprenorphine (BUTRANS) 20 MCG/HR PTWK Place onto the skin.        . carbamazepine (ANTIPSYCHOTIC - EQUETRO) 200 MG CP12 Take 300 mg by mouth 2 (two) times daily.      . clonazePAM (KLONOPIN) 0.5 MG tablet TAKE 1 TABLET BY MOUTH TWICE A DAY AS NEEDED  60 tablet  2  . Esomeprazole Magnesium (NEXIUM PO) Take  by mouth as needed.      . protriptyline (VIVACTIL) 10 MG tablet TAKE 4 TABLETS BY MOUTH EVERY MORNING  120 tablet  3  . RABEprazole (ACIPHEX) 20 MG tablet Take 2 tablets (40 mg total) by mouth daily.  60 tablet  2  . tapentadol (NUCYNTA) 50 MG TABS Take 75 mg by mouth every 4 (four) hours as needed.        No current facility-administered medications on file prior to visit.   Review of Systems  Constitutional: Negative for unexpected weight change, or unusual diaphoresis  HENT: Negative for tinnitus.   Eyes: Negative for photophobia and visual  disturbance.  Respiratory: Negative for choking and stridor.   Gastrointestinal: Negative for vomiting and blood in stool.  Genitourinary: Negative for hematuria and decreased urine volume.  Musculoskeletal: Negative for acute joint swelling Skin: Negative for color change and wound.  Neurological: Negative for tremors and numbness other than noted     Objective:   Physical Exam BP 140/98  Pulse 105  Temp(Src) 97.8 F (36.6 C) (Oral)  Ht 5\' 7"  (1.702 m)  Wt 140 lb 2 oz (63.56 kg)  BMI 21.94 kg/m2  SpO2 97% VS noted,  Constitutional: Pt appears well-developed and well-nourished.  HENT: Head: NCAT.  Right Ear: External ear normal.  Left Ear: External ear normal.  Eyes: Conjunctivae and EOM are normal. Pupils are equal, round, and reactive to light.  Neck: Normal range of motion. Neck supple.  Cardiovascular: Normal rate and regular rhythm.   Pulmonary/Chest: Effort normal and breath sounds normal.  Abd:  Soft, NT, non-distended, + BS Neurological: Pt is alert. Not confused  Skin: Skin is warm. No erythema.  Psychiatric: Pt behavior is normal. Thought content normal. 2-3+ nervous    Assessment & Plan:

## 2013-02-04 NOTE — Assessment & Plan Note (Signed)
Ongoing mod to severe, asked pt to f/u with psychiatry as she has planned

## 2013-02-04 NOTE — Assessment & Plan Note (Signed)
Encourage pt to also fu with pain MD, as she has pain contract

## 2013-02-04 NOTE — Assessment & Plan Note (Signed)
Mild to mod uncontrolled,, for add metoprolol 50 bid ( I suggested bystolic but too expensive)  to f/u any worsening symptoms or concerns, watch for fatigue

## 2013-02-11 ENCOUNTER — Telehealth: Payer: Self-pay | Admitting: Internal Medicine

## 2013-02-11 NOTE — Telephone Encounter (Signed)
Called the patient back and she was wanting education information on the neurologist she is seeing with [place].  She did call the main office and did not get any cooperation.  Informed the patient the information may not be on the website at this time as neurologist does not start until August.   Please advise.

## 2013-02-11 NOTE — Telephone Encounter (Signed)
i dont have any other information at this time, as the new neurologist has not yet started employment, but we are hoping for august

## 2013-02-11 NOTE — Telephone Encounter (Signed)
Pt called req to speak to the nurse. Please pt when you get a chance.

## 2013-02-12 ENCOUNTER — Other Ambulatory Visit (INDEPENDENT_AMBULATORY_CARE_PROVIDER_SITE_OTHER): Payer: PRIVATE HEALTH INSURANCE

## 2013-02-12 DIAGNOSIS — Z0000 Encounter for general adult medical examination without abnormal findings: Secondary | ICD-10-CM

## 2013-02-12 LAB — CBC WITH DIFFERENTIAL/PLATELET
Basophils Relative: 0.4 % (ref 0.0–3.0)
Eosinophils Relative: 4.6 % (ref 0.0–5.0)
HCT: 34.9 % — ABNORMAL LOW (ref 36.0–46.0)
Hemoglobin: 11.8 g/dL — ABNORMAL LOW (ref 12.0–15.0)
Lymphocytes Relative: 32 % (ref 12.0–46.0)
Lymphs Abs: 1.6 10*3/uL (ref 0.7–4.0)
Monocytes Relative: 10 % (ref 3.0–12.0)
Neutro Abs: 2.7 10*3/uL (ref 1.4–7.7)
RBC: 4.03 Mil/uL (ref 3.87–5.11)
RDW: 13 % (ref 11.5–14.6)

## 2013-02-12 LAB — LIPID PANEL
HDL: 83.5 mg/dL (ref >39.00)
Triglycerides: 94 mg/dL (ref 0.0–149.0)
VLDL: 18.8 mg/dL (ref 0.0–40.0)

## 2013-02-12 LAB — URINALYSIS, ROUTINE W REFLEX MICROSCOPIC
Bilirubin Urine: NEGATIVE
Ketones, ur: NEGATIVE
Specific Gravity, Urine: 1.01 (ref 1.000–1.030)
Total Protein, Urine: NEGATIVE
Urine Glucose: NEGATIVE
pH: 6.5 (ref 5.0–8.0)

## 2013-02-12 LAB — LDL CHOLESTEROL, DIRECT: Direct LDL: 116.2 mg/dL

## 2013-02-12 LAB — HEPATIC FUNCTION PANEL
AST: 24 U/L (ref 0–37)
Total Bilirubin: 0.5 mg/dL (ref 0.3–1.2)

## 2013-02-12 LAB — BASIC METABOLIC PANEL
BUN: 17 mg/dL (ref 6–23)
Calcium: 8.9 mg/dL (ref 8.4–10.5)
GFR: 93.86 mL/min (ref >60.00)
Potassium: 5 mEq/L (ref 3.5–5.1)
Sodium: 132 mEq/L — ABNORMAL LOW (ref 135–145)

## 2013-02-12 LAB — TSH: TSH: 2.17 u[IU]/mL (ref 0.35–5.50)

## 2013-02-12 NOTE — Telephone Encounter (Signed)
Patient informed. 

## 2013-02-16 ENCOUNTER — Telehealth: Payer: Self-pay | Admitting: Internal Medicine

## 2013-02-16 NOTE — Telephone Encounter (Signed)
Called the patient and she wanted a copy of results to take with her to upcoming appt. At [facility].  Patient agreed to pickup at the front desk.

## 2013-02-16 NOTE — Telephone Encounter (Signed)
Patient called requesting values for last lab tests done.

## 2013-02-17 ENCOUNTER — Other Ambulatory Visit: Payer: Self-pay | Admitting: Physician Assistant

## 2013-02-18 ENCOUNTER — Telehealth: Payer: Self-pay | Admitting: Internal Medicine

## 2013-02-18 MED ORDER — ARMODAFINIL 250 MG PO TABS: 250.0000 mg | ORAL_TABLET | Freq: Every day | ORAL | Status: DC

## 2013-02-18 NOTE — Telephone Encounter (Signed)
Called the patient informed to pickup hardcopy at the front desk. 

## 2013-02-18 NOTE — Telephone Encounter (Signed)
Ok this time only 

## 2013-02-18 NOTE — Telephone Encounter (Signed)
Sheryl Lester is going out of town on Sunday this week.  She called Dr. Alysia Penna Lugo's office (her Psychiatrist).  The message said the office is closed today.  She wants to know if Dr. Jonny Ruiz will write an RX for Nuvigil normally written by Dr. Dub Mikes.

## 2013-02-19 DIAGNOSIS — R208 Other disturbances of skin sensation: Secondary | ICD-10-CM | POA: Insufficient documentation

## 2013-03-03 ENCOUNTER — Ambulatory Visit (INDEPENDENT_AMBULATORY_CARE_PROVIDER_SITE_OTHER): Payer: PRIVATE HEALTH INSURANCE | Admitting: Neurology

## 2013-03-03 ENCOUNTER — Encounter: Payer: Self-pay | Admitting: Neurology

## 2013-03-03 VITALS — BP 100/70 | HR 88 | Temp 98.5°F | Ht 67.0 in | Wt 138.0 lb

## 2013-03-03 DIAGNOSIS — R202 Paresthesia of skin: Secondary | ICD-10-CM

## 2013-03-03 DIAGNOSIS — R209 Unspecified disturbances of skin sensation: Secondary | ICD-10-CM

## 2013-03-03 DIAGNOSIS — G500 Trigeminal neuralgia: Secondary | ICD-10-CM

## 2013-03-03 NOTE — Progress Notes (Signed)
NEUROLOGY CONSULTATION NOTE  [name] MRN: [id number] DOB: 07/08/1956  Referring physician: Dr. Jonny Ruiz Primary care physician: Dr. Jonny Ruiz  Reason for consult:  Another opinion regarding constellation of symptoms  HISTORY OF PRESENT ILLNESS: Sheryl Lester is a 57 y.o. female with a complicated history of hypertension, anxiety and trigeminal neuralgia (status post decompression) who presents for evaluation of paresthesias and pain.  She has a complex history, but these medical records are not available. History is obtained purely from the patient.  She developed right sided atypical facial pain approximately 15 years ago. She described it as a constant burning and shooting pain from the right jaw line, to the cheeks, and to the year. It was not paroxysmal and was not triggered by light touch. She went to [facility] where an MRI of the brain was reportedly performed and was normal. She saw a neurosurgeon who performed microvascular decompression. She reports that they did find ganglionic involvement. Subsequently, the pain persisted. At [facility] she saw a neuro pain physician. She was subsequently started on fentanyl patches, which have been really the only medications that were effective. Eventually, her physician concentrated most of his time on research and she had to find another pain physician. She saw one pain clinic in [place], which was not helpful. In 2009, she began going to a pain clinic and [place]. At that point, she was taking 150 mcg of fentanyl. This was tapered down to 75 mcg. She then received a trigeminal ganglion on block, which was effective for 2 months. However, subsequent blocks were ineffective. The pain began to involve the year as well, and she had developed throat pain 2. She sought your nose and throat, which couldn't find any etiology. In 2009, [facility] for the evaluation of sleepwalking.she has had sleep studies performed in the  past, which confirmed central sleep apnea, for which he uses a BiPAP. She was admitted for continuous EEG monitoring, which was normal. At [facility], she had a lumbar puncture performed in January of this year. CSF results were normal. She was referred for formal neuropsychological testing, which she says revealed evidence of "brain damage ". She denies any history of head trauma or meningitis. She is followed by both psychology and psychiatry. She takes carbamazepine for bipolar disorder. This is prescribed by her psychiatrist, as it is also used for neuropathic pain as well. More recently, she has experienced burning pain in the arms, hands, and feet.  She also notes very mild pain on the left side of her face now.She reports a remote history of untreated Lyme disease. She saw a physician who ran a trigeminal neuralgia clinic at [facility], who told her that she may have chronic Lyme disease. She was even treated with round of IV antibiotics.  Initialized the test was positive however repeat testing was negative. Western blot was always tested negative.  Past medications for her trigeminal neuralgia and pain include OxyContin, methadone, fentanyl, Topamax, gabapentin, Lyrica, and baclofen.  Image and labs personally reviewed. MRI of brain (01/30/12):  Small enhancing lesion near root entry zone of right trigeminal nerve, similar to prior study from 11/27/04, which may be seen as a sequela of her past decompressive surgery.  Nonspecific non-enhancing T2/FLAIR hyperintensities, likely chronic small vessel ischemic changes.  PAST MEDICAL HISTORY: Past Medical History  Diagnosis Date  . ALLERGIC RHINITIS 01/20/2008  . ANEMIA-IRON DEFICIENCY 01/20/2008  . ANXIETY 01/20/2008  . ASTHMATIC BRONCHITIS, ACUTE 05/19/2008  . CENTRAL SLEEP APNEA CONDS  CLASSIFIED ELSEWHERE 08/31/2010  . DEPRESSION 01/20/2008  . DYSPHAGIA UNSPECIFIED 12/06/2008  . ELEVATED BLOOD PRESSURE WITHOUT DIAGNOSIS OF HYPERTENSION 12/06/2008  . GERD 12/06/2008   . HYPERLIPIDEMIA 01/20/2008  . HYPERTENSION 12/16/2008  . OTITIS MEDIA, ACUTE, BILATERAL 12/16/2008  . OTITIS MEDIA, SEROUS, CHRONIC 12/30/2008  . PVD 05/12/2010  . SINUSITIS- ACUTE-NOS 09/21/2008  . SWELLING MASS OR LUMP IN HEAD AND NECK 09/21/2008  . TIA 12/16/2008  . Unspecified hearing loss 09/21/2008  . VENOUS INSUFFICIENCY, LEGS 05/03/2010  . THYROID NODULE, LEFT 01/07/2009  . Trigeminal neuralgia 01/20/2008  . Cancer 2012    melanoma    PAST SURGICAL HISTORY: Past Surgical History  Procedure Laterality Date  . Abdominal hysterectomy    . Microvascular decompression       for chronic facial pain/ [facility]  . Oophorectomy    . Appendectomy    . Breast surgery      Left breast biopsy, Benign  . Abdominal exploration surgery  07/1986    Hernia repair-femoral    MEDICATIONS: Current Outpatient Prescriptions on File Prior to Visit  Medication Sig Dispense Refill  . Armodafinil (NUVIGIL) 250 MG tablet Take 1 tablet (250 mg total) by mouth daily.  30 tablet  0  . Buprenorphine (BUTRANS) 20 MCG/HR PTWK Place onto the skin.        . carbamazepine (ANTIPSYCHOTIC - EQUETRO) 200 MG CP12 Take 300 mg by mouth 2 (two) times daily.      . clonazePAM (KLONOPIN) 0.5 MG tablet TAKE 1 TABLET BY MOUTH TWICE A DAY AS NEEDED  60 tablet  2  . metoprolol (LOPRESSOR) 50 MG tablet Take 1 tablet (50 mg total) by mouth 2 (two) times daily.  60 tablet  11  . protriptyline (VIVACTIL) 10 MG tablet TAKE 4 TABLETS BY MOUTH EVERY MORNING  120 tablet  3  . RABEprazole (ACIPHEX) 20 MG tablet TAKE 2 TABLETS (40 MG TOTAL) BY MOUTH DAILY.  60 tablet  0  . tapentadol (NUCYNTA) 50 MG TABS Take 75 mg by mouth every 4 (four) hours as needed.       Marland Kitchen amLODipine (NORVASC) 10 MG tablet Take 1 tablet (10 mg total) by mouth daily.  30 tablet  11  . Esomeprazole Magnesium (NEXIUM PO) Take by mouth as needed.       No current facility-administered medications on file prior to visit.    ALLERGIES: Allergies  Allergen Reactions   . Avapro (Irbesartan) Other (See Comments)    hyponatremia  . Sulfamethoxazole W-Trimethoprim     FAMILY HISTORY: Family History  Problem Relation Age of Onset  . Hyperlipidemia Mother   . Hypertension Mother   . Diabetes Father   . Melanoma Father   . Melanoma Sister   . Breast cancer Paternal Aunt   . Heart disease Paternal Grandmother   . Heart disease Other     Mother side of family-several uncles   . Colon cancer Neg Hx     SOCIAL HISTORY: History   Social History  . Marital Status: Married    Spouse Name: N/A    Number of Children: 0  . Years of Education: N/A   Occupational History  . disabled    Social History Main Topics  . Smoking status: Never Smoker   . Smokeless tobacco: Never Used  . Alcohol Use: No  . Drug Use: No  . Sexually Active: Not on file   Other Topics Concern  . Not on file   Social History Narrative  .  No narrative on file    REVIEW OF SYSTEMS: Constitutional: No fevers, chills, or sweats, no generalized fatigue, change in appetite Eyes: No visual changes, double vision, eye pain Ear, nose and throat: No hearing loss, ear pain, nasal congestion, sore throat Cardiovascular: No chest pain, palpitations Respiratory:  No shortness of breath at rest or with exertion, wheezes GastrointestinaI: No nausea, vomiting, diarrhea, abdominal pain, fecal incontinence Genitourinary:  No dysuria, urinary retention or frequency Musculoskeletal:  Generalized pain Integumentary: discoloration on foot Neurological: as above Psychiatric: anxiety, altered mood Endocrine: No palpitations, fatigue, diaphoresis, mood swings, change in appetite, change in weight, increased thirst Hematologic/Lymphatic:  No anemia, purpura, petechiae. Allergic/Immunologic: no itchy/runny eyes, nasal congestion, recent allergic reactions, rashes  PHYSICAL EXAM: Filed Vitals:   03/03/13 1037  BP: 100/70  Pulse: 88  Temp: 98.5 F (36.9 C)   General: No acute  distress Head:  Normocephalic/atraumatic Neck: supple, no paraspinal tenderness, full range of motion Back: No paraspinal tenderness Heart: regular rate and rhythm Lungs: Clear to auscultation bilaterally. Neurological Exam: Mental status: alert and oriented to person, place, time and self, speech fluent and not dysarthric, language intact. Cranial nerves: CN I: not tested CN II: pupils equal, round and reactive to light, visual fields intact, fundi unremarkable CN III, IV, VI:  full range of motion, no nystagmus, no ptosis CN V: facial sensation intact CN VII: upper and lower face symmetric CN VIII: hearing intact CN IX, X: gag intact, uvula midline CN XI: sternocleidomastoid and trapezius muscles intact CN XII: tongue midline Bulk & Tone: normal, no fasciculations. Muscle strength: 5/5 throughout Sensation: endorses reduced patchy pinprick sensation in the left foot.  Endorses mild reduced vibration sensation in the toes. Deep Tendon Reflexes: 2+ throughout, toes down-going Finger to nose testing: normal without dysmetria Heel to shin: no dysmetria Gait: normal stride and balance, able to walk in tandem. Romberg negative.  IMPRESSION & PLAN: Sheryl Lester is a 57 y.o. female with a complex history of right sided atypical facial pain and paresthesias.she reportedly had a very extensive workup, and has been seen by several neurologists. Workup has been negative. Unfortunately, none of her records are in my possession. We will have her records faxed over to [place]. I will review them. If I don't see anything that was potentially missed, I don't think any further workup would be warranted. Sometimes, an etiology is not discovered. Also, symptoms may be psychogenic as well.  I don't think her symptoms are likely due to Lyme.  Ultimately, she needs symptomatic management, particularly of her pain. She should continue seeing the pain clinic.  60 minutes spent with patient, over 50% spent  counseling and coordinating care.  Thank you for allowing me to take part in the care of this patient.  Shon Millet, DO  CC: Corwin Levins, MD

## 2013-03-03 NOTE — Patient Instructions (Addendum)
I don't think I can add anything differently in terms of workup or management.  But to be complete, I want to look over your records to see if anything was missed.

## 2013-03-14 ENCOUNTER — Other Ambulatory Visit: Payer: Self-pay | Admitting: Physician Assistant

## 2013-04-02 ENCOUNTER — Other Ambulatory Visit: Payer: Self-pay | Admitting: Internal Medicine

## 2013-04-02 NOTE — Telephone Encounter (Signed)
Done erx 

## 2013-04-03 DIAGNOSIS — G43119 Migraine with aura, intractable, without status migrainosus: Secondary | ICD-10-CM | POA: Insufficient documentation

## 2013-04-20 ENCOUNTER — Other Ambulatory Visit: Payer: Self-pay | Admitting: Internal Medicine

## 2013-04-23 ENCOUNTER — Other Ambulatory Visit: Payer: Self-pay | Admitting: Internal Medicine

## 2013-06-05 ENCOUNTER — Other Ambulatory Visit: Payer: Self-pay | Admitting: Internal Medicine

## 2013-06-05 NOTE — Telephone Encounter (Signed)
Done hardcopy to robin  

## 2013-06-05 NOTE — Telephone Encounter (Signed)
Faxed hardcopy to [facility] 

## 2013-07-02 ENCOUNTER — Other Ambulatory Visit: Payer: Self-pay

## 2013-08-25 ENCOUNTER — Other Ambulatory Visit: Payer: Self-pay | Admitting: Internal Medicine

## 2013-08-25 NOTE — Telephone Encounter (Signed)
Done hardcopy to robin  

## 2013-08-27 HISTORY — PX: MELANOMA EXCISION: SHX5266

## 2013-09-03 ENCOUNTER — Other Ambulatory Visit: Payer: Self-pay | Admitting: Internal Medicine

## 2013-09-04 MED ORDER — CLONAZEPAM 0.5 MG PO TABS: ORAL_TABLET | ORAL | Status: DC

## 2013-09-04 NOTE — Telephone Encounter (Signed)
Faxed script back to [facility].../lmb 

## 2013-09-04 NOTE — Telephone Encounter (Signed)
Done hardcopy to robin  

## 2014-01-03 ENCOUNTER — Other Ambulatory Visit: Payer: Self-pay | Admitting: Internal Medicine

## 2014-01-04 NOTE — Telephone Encounter (Signed)
Done erx  Due for ROV for further refills

## 2014-01-07 ENCOUNTER — Other Ambulatory Visit: Payer: Self-pay | Admitting: Internal Medicine

## 2014-01-07 NOTE — Telephone Encounter (Signed)
Done erx  Due for [facility] June 2015

## 2014-02-26 ENCOUNTER — Other Ambulatory Visit: Payer: Self-pay | Admitting: Internal Medicine

## 2014-03-01 ENCOUNTER — Other Ambulatory Visit: Payer: Self-pay | Admitting: Internal Medicine

## 2014-03-17 ENCOUNTER — Ambulatory Visit (INDEPENDENT_AMBULATORY_CARE_PROVIDER_SITE_OTHER): Payer: PRIVATE HEALTH INSURANCE | Admitting: Internal Medicine

## 2014-03-17 ENCOUNTER — Encounter: Payer: Self-pay | Admitting: Internal Medicine

## 2014-03-17 VITALS — BP 122/82 | HR 80 | Temp 98.3°F | Ht 67.0 in | Wt 145.5 lb

## 2014-03-17 DIAGNOSIS — R220 Localized swelling, mass and lump, head: Secondary | ICD-10-CM

## 2014-03-17 DIAGNOSIS — E041 Nontoxic single thyroid nodule: Secondary | ICD-10-CM

## 2014-03-17 DIAGNOSIS — K219 Gastro-esophageal reflux disease without esophagitis: Secondary | ICD-10-CM

## 2014-03-17 DIAGNOSIS — N760 Acute vaginitis: Secondary | ICD-10-CM | POA: Insufficient documentation

## 2014-03-17 DIAGNOSIS — R221 Localized swelling, mass and lump, neck: Secondary | ICD-10-CM

## 2014-03-17 DIAGNOSIS — S04899A Injury of other cranial nerves, unspecified side, initial encounter: Secondary | ICD-10-CM | POA: Insufficient documentation

## 2014-03-17 DIAGNOSIS — Z0000 Encounter for general adult medical examination without abnormal findings: Secondary | ICD-10-CM

## 2014-03-17 MED ORDER — PANTOPRAZOLE SODIUM 40 MG PO TBEC: 40.0000 mg | DELAYED_RELEASE_TABLET | Freq: Every day | ORAL | Status: DC

## 2014-03-17 MED ORDER — ESTROGENS, CONJUGATED 0.625 MG/GM VA CREA: 1.0000 | TOPICAL_CREAM | Freq: Every day | VAGINAL | Status: DC

## 2014-03-17 NOTE — Assessment & Plan Note (Signed)
Cant r/o malignancy - for ent referral, consider ct neck

## 2014-03-17 NOTE — Assessment & Plan Note (Signed)

## 2014-03-17 NOTE — Assessment & Plan Note (Signed)
Presumed bacterial, for flagyl asd,  to f/u any worsening symptoms or concerns

## 2014-03-17 NOTE — Progress Notes (Signed)
Subjective:    Patient ID: Sheryl Lester, female    DOB: July 24, 1956, 58 y.o.   MRN: [id number]  HPI Here for wellness and f/u;  Overall doing ok;  Pt denies CP, worsening SOB, DOE, wheezing, orthopnea, PND, worsening LE edema, palpitations, dizziness or syncope.  Pt denies neurological change such as new headache, facial or extremity weakness.  Pt denies polydipsia, polyuria, or low sugar symptoms. Pt states overall good compliance with treatment and medications, good tolerability, and has been trying to follow lower cholesterol diet.  Pt denies worsening depressive symptoms, suicidal ideation or panic. No fever, night sweats, wt loss, loss of appetite, or other constitutional symptoms.  Pt states good ability with ADL's, has low fall risk, home safety reviewed and adequate, no other significant changes in hearing or vision, and only occasionally active with exercise.   Mother with recent dx metastatic vaginal cancer now s/p cmt/xrt, doing ok. Sees neurology at [facility] as well as [place] ([facility] - Dr Ellin Goodie). Has recent lab results with chornic low sodium 128.  Asks about thyroid nodule f/u after 2006 exam, not sure if getting enlarged.  Asks for premarin cr vaginal due to pain with intercourse.   Also has new psychiatrist - Pixie Casino MD - [facility].  Not aware of any mass to neck Past Medical History  Diagnosis Date  . ALLERGIC RHINITIS 01/20/2008  . ANEMIA-IRON DEFICIENCY 01/20/2008  . ANXIETY 01/20/2008  . ASTHMATIC BRONCHITIS, ACUTE 05/19/2008  . CENTRAL SLEEP APNEA CONDS CLASSIFIED ELSEWHERE 08/31/2010  . DEPRESSION 01/20/2008  . DYSPHAGIA UNSPECIFIED 12/06/2008  . ELEVATED BLOOD PRESSURE WITHOUT DIAGNOSIS OF HYPERTENSION 12/06/2008  . GERD 12/06/2008  . HYPERLIPIDEMIA 01/20/2008  . HYPERTENSION 12/16/2008  . OTITIS MEDIA, ACUTE, BILATERAL 12/16/2008  . OTITIS MEDIA, SEROUS, CHRONIC 12/30/2008  . PVD 05/12/2010  . SINUSITIS- ACUTE-NOS 09/21/2008  . SWELLING MASS OR  LUMP IN HEAD AND NECK 09/21/2008  . TIA 12/16/2008  . Unspecified hearing loss 09/21/2008  . VENOUS INSUFFICIENCY, LEGS 05/03/2010  . THYROID NODULE, LEFT 01/07/2009  . Trigeminal neuralgia 01/20/2008  . Cancer 2012    melanoma   Past Surgical History  Procedure Laterality Date  . Abdominal hysterectomy    . Microvascular decompression       for chronic facial pain/ [facility]  . Oophorectomy    . Appendectomy    . Breast surgery      Left breast biopsy, Benign  . Abdominal exploration surgery  07/1986    Hernia repair-femoral    reports that she has never smoked. She has never used smokeless tobacco. She reports that she does not drink alcohol or use illicit drugs. family history includes Breast cancer in her paternal aunt; Diabetes in her father; Heart disease in her other and paternal grandmother; Hyperlipidemia in her mother; Hypertension in her mother; Melanoma in her father and sister. There is no history of Colon cancer. Allergies  Allergen Reactions  . Avapro [Irbesartan] Other (See Comments)    hyponatremia  . Sulfamethoxazole-Trimethoprim    Current Outpatient Prescriptions on File Prior to Visit  Medication Sig Dispense Refill  . Buprenorphine (BUTRANS) 20 MCG/HR PTWK Place onto the skin.        . carbamazepine (ANTIPSYCHOTIC - EQUETRO) 200 MG CP12 Take 300 mg by mouth 2 (two) times daily.      . clonazePAM (KLONOPIN) 0.5 MG tablet TAKE 1 TABLET BY MOUTH TWICE A DAY AS NEEDED  60 tablet  2  . metoprolol (LOPRESSOR) 50  MG tablet TAKE 1 TABLET (50 MG TOTAL) BY MOUTH 2 (TWO) TIMES DAILY.  60 tablet  11  . protriptyline (VIVACTIL) 10 MG tablet TAKE 4 TABLETS BY MOUTH EVERY MORNING  120 tablet  3  . protriptyline (VIVACTIL) 10 MG tablet TAKE 4 TABLETS BY MOUTH EVERY MORNING  120 tablet  0  . protriptyline (VIVACTIL) 10 MG tablet TAKE 4 TABLETS BY MOUTH EVERY MORNING  120 tablet  0  . RABEprazole (ACIPHEX) 20 MG tablet TAKE 2 TABLETS (40 MG TOTAL) BY MOUTH DAILY.  60 tablet  0  .  tapentadol (NUCYNTA) 50 MG TABS Take 75 mg by mouth every 4 (four) hours as needed.        No current facility-administered medications on file prior to visit.     Review of Systems Constitutional: Negative for increased diaphoresis, other activity, appetite or other siginficant weight change  HENT: Negative for worsening hearing loss, ear pain, facial swelling, mouth sores and neck stiffness.   Eyes: Negative for other worsening pain, redness or visual disturbance.  Neck with right lateral sub mass prob > 1 cm, firm, ? Fixed, nontender, no overlying skin change, located post to distal SCM Respiratory: Negative for shortness of breath and wheezing.   Cardiovascular: Negative for chest pain and palpitations.  Gastrointestinal: Negative for diarrhea, blood in stool, abdominal distention or other pain Genitourinary: Negative for hematuria, flank pain or change in urine volume.  Musculoskeletal: Negative for myalgias or other joint complaints.  Skin: Negative for color change and wound.  Neurological: Negative for syncope and numbness. other than noted Hematological: Negative for adenopathy. or other swelling Psychiatric/Behavioral: Negative for hallucinations, self-injury, decreased concentration or other worsening agitation.      Objective:   Physical Exam BP 122/82  Pulse 80  Temp(Src) 98.3 F (36.8 C) (Oral)  Ht 5\' 7"  (1.702 m)  Wt 145 lb 8 oz (65.998 kg)  BMI 22.78 kg/m2  SpO2 98% VS noted,  Constitutional: Pt is oriented to person, place, and time. Appears well-developed and well-nourished.  Head: Normocephalic and atraumatic.  Right Ear: External ear normal.  Left Ear: External ear normal.  Nose: Nose normal.  Mouth/Throat: Oropharynx is clear and moist.  Eyes: Conjunctivae and EOM are normal. Pupils are equal, round, and reactive to light.  Neck: Normal range of motion. Neck supple. No JVD present. No tracheal deviation present.  Cardiovascular: Normal rate, regular  rhythm, normal heart sounds and intact distal pulses.   Pulmonary/Chest: Effort normal and breath sounds without rales or wheezing  Abdominal: Soft. Bowel sounds are normal. NT. No HSM  Musculoskeletal: Normal range of motion. Exhibits no edema.  Lymphadenopathy:  Has no cervical adenopathy.  Neurological: Pt is alert and oriented to person, place, and time. Pt has normal reflexes. No cranial nerve deficit. Motor grossly intact Skin: Skin is warm and dry. No rash noted.  Psychiatric:  Has normal mood and affect. Behavior is normal. except mild nervous chronic    Assessment & Plan:

## 2014-03-17 NOTE — Patient Instructions (Addendum)
OK to do the 3D tomography with the next mammogram  Please take all new medication as prescribed - the protonix 40 mg per day, and the premarin cream  Please call if you feel this needs changed to the Aciphex, but will likely be more expensive  You are given the prescription for the shingles shot  Please continue all other medications as before, and refills have been done if requested.  Please have the pharmacy call with any other refills you may need.  Please continue your efforts at being more active, low cholesterol diet, and weight control.  You are otherwise up to date with prevention measures today.  Please keep your appointments with your specialists as you may have planned  Please go to the LAB in the Basement (turn left off the elevator) for the tests to be done today  You will be contacted by phone if any changes need to be made immediately.  Otherwise, you will receive a letter about your results with an explanation, but please check with MyChart first.  Please remember to sign up for MyChart if you have not done so, as this will be important to you in the future with finding out test results, communicating by private email, and scheduling acute appointments online when needed.  You will be contacted regarding the referral for: ENT, and thryoid ultrasound  Please return in 6 months, or sooner if needed

## 2014-03-17 NOTE — Assessment & Plan Note (Signed)
Forf/u thryoid u/s

## 2014-03-17 NOTE — Assessment & Plan Note (Signed)
Mild to mod, for protonix 40 qd,  to f/u any worsening symptoms or concerns 

## 2014-03-17 NOTE — Progress Notes (Signed)
Pre visit review using our clinic review tool, if applicable. No additional management support is needed unless otherwise documented below in the visit note. 

## 2014-03-19 ENCOUNTER — Other Ambulatory Visit: Payer: Self-pay

## 2014-03-19 DIAGNOSIS — Z1231 Encounter for screening mammogram for malignant neoplasm of breast: Secondary | ICD-10-CM

## 2014-03-22 ENCOUNTER — Inpatient Hospital Stay: Admission: RE | Admit: 2014-03-22 | Payer: PRIVATE HEALTH INSURANCE | Source: Ambulatory Visit

## 2014-03-23 ENCOUNTER — Other Ambulatory Visit: Payer: Self-pay | Admitting: Internal Medicine

## 2014-03-23 ENCOUNTER — Ambulatory Visit
Admission: RE | Admit: 2014-03-23 | Discharge: 2014-03-23 | Disposition: A | Discharge status: Home / Self Care | Payer: PRIVATE HEALTH INSURANCE | Source: Ambulatory Visit | Attending: Internal Medicine | Admitting: Internal Medicine

## 2014-03-23 DIAGNOSIS — E041 Nontoxic single thyroid nodule: Secondary | ICD-10-CM

## 2014-03-23 DIAGNOSIS — R221 Localized swelling, mass and lump, neck: Secondary | ICD-10-CM

## 2014-03-23 NOTE — Telephone Encounter (Signed)
Done erx 

## 2014-03-25 ENCOUNTER — Ambulatory Visit: Payer: PRIVATE HEALTH INSURANCE

## 2014-03-29 ENCOUNTER — Telehealth: Payer: Self-pay | Admitting: Internal Medicine

## 2014-03-29 ENCOUNTER — Ambulatory Visit: Payer: PRIVATE HEALTH INSURANCE

## 2014-03-29 NOTE — Telephone Encounter (Signed)
Patient states she went to pick up her Protonix and it was too expensive. She states she needs something that comes as a generic. [facility] [address]

## 2014-03-30 NOTE — Telephone Encounter (Signed)
protonix is normally generic, so I am confused.  Maybe she means even the generic is not covered by insurance  If this is the case, please have pt ask at pharmacy about a similar med covered by her insurance that we can change to, and I would be happy to do this, since we cant tell on our end about her insurance med coverage

## 2014-03-31 NOTE — Telephone Encounter (Signed)
Patient informed of MD instructions and will do the research and let us know.

## 2014-04-02 ENCOUNTER — Ambulatory Visit: Payer: PRIVATE HEALTH INSURANCE

## 2014-04-06 ENCOUNTER — Ambulatory Visit: Payer: PRIVATE HEALTH INSURANCE

## 2014-04-21 ENCOUNTER — Ambulatory Visit: Payer: PRIVATE HEALTH INSURANCE

## 2014-04-23 ENCOUNTER — Ambulatory Visit
Admission: RE | Admit: 2014-04-23 | Discharge: 2014-04-23 | Disposition: A | Discharge status: Home / Self Care | Payer: PRIVATE HEALTH INSURANCE | Source: Ambulatory Visit

## 2014-04-23 DIAGNOSIS — Z1231 Encounter for screening mammogram for malignant neoplasm of breast: Secondary | ICD-10-CM

## 2014-12-09 ENCOUNTER — Telehealth: Payer: Self-pay | Admitting: Internal Medicine

## 2014-12-09 NOTE — Telephone Encounter (Signed)
Ok with me 

## 2014-12-09 NOTE — Telephone Encounter (Signed)
Patient is requesting to transfer care from Dr. [name] to Dr. Alain Marion.  Please advise.

## 2015-02-14 ENCOUNTER — Encounter (HOSPITAL_COMMUNITY): Payer: Self-pay | Admitting: Emergency Medicine

## 2015-02-14 ENCOUNTER — Emergency Department (HOSPITAL_COMMUNITY)
Admission: EM | Admit: 2015-02-14 | Discharge: 2015-02-14 | Disposition: A | Discharge status: Home / Self Care | Payer: PRIVATE HEALTH INSURANCE | Source: Home / Self Care | Attending: Family Medicine | Admitting: Family Medicine

## 2015-02-14 DIAGNOSIS — B349 Viral infection, unspecified: Secondary | ICD-10-CM | POA: Diagnosis not present

## 2015-02-14 DIAGNOSIS — L720 Epidermal cyst: Secondary | ICD-10-CM

## 2015-02-14 DIAGNOSIS — L259 Unspecified contact dermatitis, unspecified cause: Secondary | ICD-10-CM | POA: Diagnosis not present

## 2015-02-14 MED ORDER — TRIAMCINOLONE ACETONIDE 0.1 % EX CREA: 1.0000 "application " | TOPICAL_CREAM | Freq: Two times a day (BID) | CUTANEOUS | Status: DC

## 2015-02-14 NOTE — ED Provider Notes (Signed)
CSN: [id number]     Arrival date & time 02/14/15  1305 History   First MD Initiated Contact with Patient 02/14/15 1342     Chief Complaint  Patient presents with  . Groin Swelling  . URI   (Consider location/radiation/quality/duration/timing/severity/associated sxs/prior Treatment) HPI Comments: 59 year old female with 3 complaints. The first complaint is that of upon awakening this morning she felt cold had some chills and pain in the lower back. She took some unspecified medicine to help her feel better and shortly after she did feel better and now she feels generally well. She denies urinary symptoms. Denies cough, shortness of breath, chest pain, abdominal pain, nausea or vomiting. She does have mild sore throat and PND.  Second complaint is that of small areas of a rash to her extremities. The small areas itch and she has had them for a month. She states her husband called poison ivy about the time that these areas develop. They have a call away. They are located primarily around the antecubital fossa is lesser to the forearms and to the feet and thighs.  Third complaint is that of a "lump" to the upper labia for 2 weeks. It has not changed in size or appearance.   Past Medical History  Diagnosis Date  . ALLERGIC RHINITIS 01/20/2008  . ANEMIA-IRON DEFICIENCY 01/20/2008  . ANXIETY 01/20/2008  . ASTHMATIC BRONCHITIS, ACUTE 05/19/2008  . CENTRAL SLEEP APNEA CONDS CLASSIFIED ELSEWHERE 08/31/2010  . DEPRESSION 01/20/2008  . DYSPHAGIA UNSPECIFIED 12/06/2008  . ELEVATED BLOOD PRESSURE WITHOUT DIAGNOSIS OF HYPERTENSION 12/06/2008  . GERD 12/06/2008  . HYPERLIPIDEMIA 01/20/2008  . HYPERTENSION 12/16/2008  . OTITIS MEDIA, ACUTE, BILATERAL 12/16/2008  . OTITIS MEDIA, SEROUS, CHRONIC 12/30/2008  . PVD 05/12/2010  . SINUSITIS- ACUTE-NOS 09/21/2008  . SWELLING MASS OR LUMP IN HEAD AND NECK 09/21/2008  . TIA 12/16/2008  . Unspecified hearing loss 09/21/2008  . VENOUS INSUFFICIENCY, LEGS 05/03/2010  . THYROID  NODULE, LEFT 01/07/2009  . Trigeminal neuralgia 01/20/2008  . Cancer 2012    melanoma   Past Surgical History  Procedure Laterality Date  . Abdominal hysterectomy    . Microvascular decompression       for chronic facial pain/ [facility]  . Oophorectomy    . Appendectomy    . Breast surgery      Left breast biopsy, Benign  . Abdominal exploration surgery  07/1986    Hernia repair-femoral   Family History  Problem Relation Age of Onset  . Hyperlipidemia Mother   . Hypertension Mother   . Diabetes Father   . Melanoma Father   . Melanoma Sister   . Breast cancer Paternal Aunt   . Heart disease Paternal Grandmother   . Heart disease Other     Mother side of family-several uncles   . Colon cancer Neg Hx    History  Substance Use Topics  . Smoking status: Never Smoker   . Smokeless tobacco: Never Used  . Alcohol Use: No   OB History    No data available     Review of Systems  Constitutional: Positive for fever. Negative for chills, activity change, appetite change and fatigue.  HENT: Positive for congestion, postnasal drip, rhinorrhea and sore throat. Negative for facial swelling.   Eyes: Negative.   Respiratory: Negative.  Negative for cough, shortness of breath and wheezing.   Cardiovascular: Negative.   Gastrointestinal: Negative.   Genitourinary: Negative.  Negative for dysuria, frequency, hematuria, decreased urine volume, vaginal bleeding, vaginal discharge, menstrual problem and  pelvic pain.  Musculoskeletal: Positive for back pain. Negative for neck pain and neck stiffness.  Skin: Negative for pallor and rash.  Neurological: Negative.   Psychiatric/Behavioral: Negative.     Allergies  Avapro and Sulfamethoxazole-trimethoprim  Home Medications   Prior to Admission medications   Medication Sig Start Date End Date Taking? Authorizing Provider  Buprenorphine (BUTRANS) 20 MCG/HR PTWK Place onto the skin.     Yes Historical Provider, MD  metoprolol (LOPRESSOR) 50 MG  tablet TAKE 1 TABLET (50 MG TOTAL) BY MOUTH 2 (TWO) TIMES DAILY.   Yes Biagio Borg, MD  pantoprazole (PROTONIX) 40 MG tablet Take 1 tablet (40 mg total) by mouth daily. 03/17/14  Yes Biagio Borg, MD  protriptyline (VIVACTIL) 10 MG tablet TAKE 4 TABLETS BY MOUTH EVERY MORNING 10/30/12  Yes Biagio Borg, MD  tapentadol (NUCYNTA) 50 MG TABS Take 75 mg by mouth every 4 (four) hours as needed.    Yes Historical Provider, MD  carbamazepine (ANTIPSYCHOTIC - EQUETRO) 200 MG CP12 Take 300 mg by mouth 2 (two) times daily.    Historical Provider, MD  clonazePAM (KLONOPIN) 0.5 MG tablet TAKE 1 TABLET BY MOUTH TWICE A DAY AS NEEDED 09/04/13   Biagio Borg, MD  conjugated estrogens (PREMARIN) vaginal cream Place 1 Applicatorful vaginally daily. 03/17/14   Biagio Borg, MD  protriptyline (VIVACTIL) 10 MG tablet TAKE 4 TABLETS BY MOUTH EVERY MORNING    Biagio Borg, MD  protriptyline (VIVACTIL) 10 MG tablet TAKE 4 TABLETS BY MOUTH EVERY MORNING 03/23/14   Biagio Borg, MD  triamcinolone cream (KENALOG) 0.1 % Apply 1 application topically 2 (two) times daily. 02/14/15   Janne Napoleon, NP   BP 104/66 mmHg  Pulse 66  Temp(Src) 99.9 F (37.7 C) (Oral)  Resp 18  SpO2 97% Physical Exam  Constitutional: She is oriented to person, place, and time. She appears well-developed and well-nourished. No distress.  HENT:  Mouth/Throat: No oropharyngeal exudate.  Bilateral TMs are normal Posterior pharynx with minor streaky erythema and clear PND. There are no exudates or swelling. Airway widely patent.   Eyes: Conjunctivae are normal.  Neck: Normal range of motion. Neck supple.  Cardiovascular: Normal rate, regular rhythm, normal heart sounds and intact distal pulses.   Pulmonary/Chest: Effort normal and breath sounds normal. No respiratory distress. She has no wheezes. She has no rales.  Abdominal: Soft. There is no tenderness.  Genitourinary: No vaginal discharge found.  Normal external female genitalia After several minutes  the lesion in which the patient was concerned about was found. This is a small epidermal  structure measuring approximately 1 maybe 2 mm in diameter. Annular in shape with sharp borders. Nontender. No erythema. No discoloration. No erythema. No swelling.  Musculoskeletal: She exhibits no edema or tenderness.  Lymphadenopathy:    She has no cervical adenopathy.  Neurological: She is alert and oriented to person, place, and time. She exhibits normal muscle tone.  Skin: Skin is warm. No rash noted. No erythema.  Psychiatric: She has a normal mood and affect.  Nursing note and vitals reviewed.   ED Course  Procedures (including critical care time) Labs [facility] Reviewed - No data to display  Imaging Review No results found.   MDM   1. Viral illness   2. Contact dermatitis   3. Epidermal cyst    Triamcinolone cream Reassurance See your derm this week as scheduled. Tylenol prn    Janne Napoleon, NP 02/14/15 1436

## 2015-02-14 NOTE — Discharge Instructions (Signed)
Allergies  Allergies may happen from anything your body is sensitive to. This may be food, medicines, pollens, chemicals, and many other things. Food allergies can be severe and deadly.  HOME CARE  If you do not know what causes a reaction, keep a diary. Write down the foods you ate and the symptoms that followed. Avoid foods that cause reactions.  If you have red raised spots (hives) or a rash:  Take medicine as told by your doctor.  Use medicines for red raised spots and itching as needed.  Apply cold cloths (compresses) to the skin. Take a cool bath. Avoid hot baths or showers.  If you are severely allergic:  It is often necessary to go to the hospital after you have treated your reaction.  Wear your medical alert jewelry.  You and your family must learn how to give a allergy shot or use an allergy kit (anaphylaxis kit).  Always carry your allergy kit or shot with you. Use this medicine as told by your doctor if a severe reaction is occurring. GET HELP RIGHT AWAY IF:  You have trouble breathing or are making high-pitched whistling sounds (wheezing).  You have a tight feeling in your chest or throat.  You have a puffy (swollen) mouth.  You have red raised spots, puffiness (swelling), or itching all over your body.  You have had a severe reaction that was helped by your allergy kit or shot. The reaction can return once the medicine has worn off.  You think you are having a food allergy. Symptoms most often happen within 30 minutes of eating a food.  Your symptoms have not gone away within 2 days or are getting worse.  You have new symptoms.  You want to retest yourself with a food or drink you think causes an allergic reaction. Only do this under the care of a doctor. MAKE SURE YOU:   Understand these instructions.  Will watch your condition.  Will get help right away if you are not doing well or get worse. Document Released: 12/08/2012 Document Reviewed:  12/08/2012 [facility] Patient Information 2015 [place]. This information is not intended to replace advice given to you by your health care provider. Make sure you discuss any questions you have with your health care provider.  Contact Dermatitis Contact dermatitis is a rash that happens when something touches the skin. You touched something that irritates your skin, or you have allergies to something you touched. HOME CARE   Avoid the thing that caused your rash.  Keep your rash away from hot water, soap, sunlight, chemicals, and other things that might bother it.  Do not scratch your rash.  You can take cool baths to help stop itching.  Only take medicine as told by your doctor.  Keep all doctor visits as told. GET HELP RIGHT AWAY IF:   Your rash is not better after 3 days.  Your rash gets worse.  Your rash is puffy (swollen), tender, red, sore, or warm.  You have problems with your medicine. MAKE SURE YOU:   Understand these instructions.  Will watch your condition.  Will get help right away if you are not doing well or get worse. Document Released: 06/10/2009 Document Revised: 11/05/2011 Document Reviewed: 01/16/2011 [facility] Patient Information 2015 [place], [place]. This information is not intended to replace advice given to you by your health care provider. Make sure you discuss any questions you have with your health care provider.  Viral Infections A viral infection can be  caused by different types of viruses.Most viral infections are not serious and resolve on their own. However, some infections may cause severe symptoms and may lead to further complications. SYMPTOMS Viruses can frequently cause:  Minor sore throat.  Aches and pains.  Headaches.  Runny nose.  Different types of rashes.  Watery eyes.  Tiredness.  Cough.  Loss of appetite.  Gastrointestinal infections, resulting in nausea, vomiting, and diarrhea. These symptoms do not  respond to antibiotics because the infection is not caused by bacteria. However, you might catch a bacterial infection following the viral infection. This is sometimes called a "superinfection." Symptoms of such a bacterial infection may include:  Worsening sore throat with pus and difficulty swallowing.  Swollen neck glands.  Chills and a high or persistent fever.  Severe headache.  Tenderness over the sinuses.  Persistent overall ill feeling (malaise), muscle aches, and tiredness (fatigue).  Persistent cough.  Yellow, green, or brown mucus production with coughing. HOME CARE INSTRUCTIONS   Only take over-the-counter or prescription medicines for pain, discomfort, diarrhea, or fever as directed by your caregiver.  Drink enough water and fluids to keep your urine clear or pale yellow. Sports drinks can provide valuable electrolytes, sugars, and hydration.  Get plenty of rest and maintain proper nutrition. Soups and broths with crackers or rice are fine. SEEK IMMEDIATE MEDICAL CARE IF:   You have severe headaches, shortness of breath, chest pain, neck pain, or an unusual rash.  You have uncontrolled vomiting, diarrhea, or you are unable to keep down fluids.  You or your child has an oral temperature above 102 F (38.9 C), not controlled by medicine.  Your baby is older than 3 months with a rectal temperature of 102 F (38.9 C) or higher.  Your baby is 32 months old or younger with a rectal temperature of 100.4 F (38 C) or higher. MAKE SURE YOU:   Understand these instructions.  Will watch your condition.  Will get help right away if you are not doing well or get worse. Document Released: 05/23/2005 Document Revised: 11/05/2011 Document Reviewed: 12/18/2010 [facility] Patient Information 2015 [place], [place]. This information is not intended to replace advice given to you by your health care provider. Make sure you discuss any questions you have with your health care  provider.

## 2015-02-14 NOTE — ED Notes (Signed)
C/o vaginal lump onset 3 weeks; no pain associated; just concerned Also c/o cold sx onset today... Sx include fevers, chills and a rash  Alert, no signs of acute distress.

## 2015-02-15 NOTE — Telephone Encounter (Signed)
Can patient establish with Plot?

## 2015-02-16 NOTE — Telephone Encounter (Signed)
Unfortunately, I'm not able to accept any more new patients at this time.  I'm sorry! Thank you!  

## 2015-02-18 NOTE — Telephone Encounter (Signed)
Notified patient.

## 2015-03-05 ENCOUNTER — Other Ambulatory Visit: Payer: Self-pay | Admitting: Internal Medicine

## 2015-03-25 DIAGNOSIS — R51 Headache: Secondary | ICD-10-CM

## 2015-03-25 DIAGNOSIS — G8929 Other chronic pain: Secondary | ICD-10-CM | POA: Insufficient documentation

## 2015-03-30 ENCOUNTER — Other Ambulatory Visit: Payer: Self-pay | Admitting: Internal Medicine

## 2015-03-30 DIAGNOSIS — Z1231 Encounter for screening mammogram for malignant neoplasm of breast: Secondary | ICD-10-CM

## 2015-05-03 ENCOUNTER — Other Ambulatory Visit: Payer: Self-pay | Admitting: Internal Medicine

## 2015-05-06 ENCOUNTER — Other Ambulatory Visit: Payer: Self-pay | Admitting: Internal Medicine

## 2015-05-10 ENCOUNTER — Ambulatory Visit: Payer: PRIVATE HEALTH INSURANCE

## 2015-05-18 ENCOUNTER — Encounter: Payer: Self-pay | Admitting: Internal Medicine

## 2015-05-24 LAB — HEPATIC FUNCTION PANEL
ALT: 26 U/L (ref 7–35)
AST: 24 U/L (ref 13–35)
Alkaline Phosphatase: 105 U/L (ref 25–125)
Bilirubin, Total: 0.3 mg/dL

## 2015-05-24 LAB — BASIC METABOLIC PANEL
BUN: 29 mg/dL — AB (ref 4–21)
Creatinine: 0.6 mg/dL (ref 0.5–1.1)
Glucose: 80 mg/dL
Potassium: 5 mmol/L (ref 3.4–5.3)
Sodium: 134 mmol/L — AB (ref 137–147)

## 2015-05-24 LAB — CBC AND DIFFERENTIAL
HCT: 33 % — AB (ref 36–46)
Hemoglobin: 11.4 g/dL — AB (ref 12.0–16.0)
Neutrophils Absolute: 5 /uL
PLATELETS: 242 10*3/uL (ref 150–399)
WBC: 7 10*3/mL

## 2015-05-30 ENCOUNTER — Ambulatory Visit
Admission: RE | Admit: 2015-05-30 | Discharge: 2015-05-30 | Disposition: A | Discharge status: Home / Self Care | Payer: PRIVATE HEALTH INSURANCE | Source: Ambulatory Visit | Attending: Internal Medicine | Admitting: Internal Medicine

## 2015-05-30 DIAGNOSIS — Z1231 Encounter for screening mammogram for malignant neoplasm of breast: Secondary | ICD-10-CM

## 2015-06-01 ENCOUNTER — Other Ambulatory Visit: Payer: Self-pay | Admitting: Internal Medicine

## 2015-06-03 ENCOUNTER — Telehealth: Payer: Self-pay | Admitting: Endocrinology

## 2015-06-03 NOTE — Telephone Encounter (Signed)
See note below and please advise if appointment should be scheduled.  Thanks!

## 2015-06-03 NOTE — Telephone Encounter (Signed)
Sheryl Lester please scheduled this patient for a new patient appointment. Thanks!

## 2015-06-03 NOTE — Telephone Encounter (Signed)
New pt, please

## 2015-06-03 NOTE — Telephone Encounter (Signed)
Patient haven't was last seen in 2011, do I put her in as a new patient or an office visit, please advise

## 2015-06-06 NOTE — Telephone Encounter (Signed)
I will do it.

## 2015-06-14 ENCOUNTER — Ambulatory Visit (INDEPENDENT_AMBULATORY_CARE_PROVIDER_SITE_OTHER): Payer: PRIVATE HEALTH INSURANCE | Admitting: Endocrinology

## 2015-06-14 ENCOUNTER — Encounter: Payer: Self-pay | Admitting: Endocrinology

## 2015-06-14 VITALS — BP 130/88 | HR 64 | Temp 98.3°F | Ht 67.5 in | Wt 145.0 lb

## 2015-06-14 DIAGNOSIS — E041 Nontoxic single thyroid nodule: Secondary | ICD-10-CM | POA: Diagnosis not present

## 2015-06-14 DIAGNOSIS — E042 Nontoxic multinodular goiter: Secondary | ICD-10-CM | POA: Diagnosis not present

## 2015-06-14 LAB — TSH: TSH: 0.86 u[IU]/mL (ref 0.35–4.50)

## 2015-06-14 LAB — T4, FREE: Free T4: 0.65 ng/dL (ref 0.60–1.60)

## 2015-06-14 NOTE — Progress Notes (Signed)
Subjective:    Patient ID: Sheryl Lester, female    DOB: 1955/12/12, 59 y.o.   MRN: [id number]  HPI Pt was noted to have a multinodular goiter in 2006.  she has no h/o XRT or surgery to the neck.   She dad neg bx in 2006.  she has never been on prescribed thyroid hormone therapy.  she has never taken kelp or any other type of non-prescribed thyroid product.  she has never had thyroid surgery, or XRT to the neck.  she has never been on amiodarone or lithium.  She has slight swelling of the legs, and assoc fatigue.  This is better with adderall.   Past Medical History  Diagnosis Date  . ALLERGIC RHINITIS 01/20/2008  . ANEMIA-IRON DEFICIENCY 01/20/2008  . ANXIETY 01/20/2008  . ASTHMATIC BRONCHITIS, ACUTE 05/19/2008  . CENTRAL SLEEP APNEA CONDS CLASSIFIED ELSEWHERE 08/31/2010  . DEPRESSION 01/20/2008  . DYSPHAGIA UNSPECIFIED 12/06/2008  . ELEVATED BLOOD PRESSURE WITHOUT DIAGNOSIS OF HYPERTENSION 12/06/2008  . GERD 12/06/2008  . HYPERLIPIDEMIA 01/20/2008  . HYPERTENSION 12/16/2008  . OTITIS MEDIA, ACUTE, BILATERAL 12/16/2008  . OTITIS MEDIA, SEROUS, CHRONIC 12/30/2008  . PVD 05/12/2010  . SINUSITIS- ACUTE-NOS 09/21/2008  . SWELLING MASS OR LUMP IN HEAD AND NECK 09/21/2008  . TIA 12/16/2008  . Unspecified hearing loss 09/21/2008  . VENOUS INSUFFICIENCY, LEGS 05/03/2010  . THYROID NODULE, LEFT 01/07/2009  . Trigeminal neuralgia 01/20/2008  . Cancer [facility]) 2012    melanoma    Past Surgical History  Procedure Laterality Date  . Abdominal hysterectomy    . Microvascular decompression       for chronic facial pain/ [facility]  . Oophorectomy    . Appendectomy    . Breast surgery      Left breast biopsy, Benign  . Abdominal exploration surgery  07/1986    Hernia repair-femoral    Social History   Social History  . Marital Status: Married    Spouse Name: N/A  . Number of Children: 0  . Years of Education: N/A   Occupational History  . disabled    Social History Main Topics  . Smoking status:  Never Smoker   . Smokeless tobacco: Never Used  . Alcohol Use: No  . Drug Use: No  . Sexual Activity: Not on file   Other Topics Concern  . Not on file   Social History Narrative    Current Outpatient Prescriptions on File Prior to Visit  Medication Sig Dispense Refill  . Buprenorphine (BUTRANS) 20 MCG/HR PTWK Place onto the skin.      . carbamazepine (ANTIPSYCHOTIC - EQUETRO) 200 MG CP12 Take 300 mg by mouth 2 (two) times daily.    . clonazePAM (KLONOPIN) 0.5 MG tablet TAKE 1 TABLET BY MOUTH TWICE A DAY AS NEEDED 60 tablet 2  . conjugated estrogens (PREMARIN) vaginal cream Place 1 Applicatorful vaginally daily. 42.5 g 12  . metoprolol (LOPRESSOR) 50 MG tablet TAKE 1 TABLET (50 MG TOTAL) BY MOUTH 2 (TWO) TIMES DAILY. 60 tablet 11  . pantoprazole (PROTONIX) 40 MG tablet Take 1 tablet (40 mg total) by mouth daily. 90 tablet 3  . protriptyline (VIVACTIL) 10 MG tablet TAKE 4 TABLETS BY MOUTH EVERY MORNING 120 tablet 3  . protriptyline (VIVACTIL) 10 MG tablet TAKE 4 TABLETS BY MOUTH EVERY MORNING 120 tablet 0  . tapentadol (NUCYNTA) 50 MG TABS Take 75 mg by mouth every 4 (four) hours as needed.     . triamcinolone cream (KENALOG) 0.1 %  Apply 1 application topically 2 (two) times daily. (Patient not taking: Reported on 06/14/2015) 30 g 0   No current facility-administered medications on file prior to visit.    Allergies  Allergen Reactions  . Avapro [Irbesartan] Other (See Comments)    hyponatremia  . Sulfamethoxazole-Trimethoprim     Family History  Problem Relation Age of Onset  . Hyperlipidemia Mother   . Hypertension Mother   . Diabetes Father   . Melanoma Father   . Melanoma Sister   . Breast cancer Paternal Aunt   . Heart disease Paternal Grandmother   . Heart disease Other     Mother side of family-several uncles   . Colon cancer Neg Hx     BP 130/88 mmHg  Pulse 64  Temp(Src) 98.3 F (36.8 C) (Oral)  Ht 5' 7.5" (1.715 m)  Wt 145 lb (65.772 kg)  BMI 22.36 kg/m2   SpO2 96%    Review of Systems denies depression, hair loss, muscle cramps, sob, weight gain, memory loss, numbness, blurry vision, cold intolerance, myalgias, and syncope.  She has dry skin, constipation, rhinorrhea, and easy bruising.    Objective:   Physical Exam VS: see vs page GEN: no distress HEAD: head: no deformity eyes: no periorbital swelling, no proptosis external nose and ears are normal mouth: no lesion seen NECK: small multinodular goiter (R>L). CHEST WALL: no deformity LUNGS:  Clear to auscultation CV: reg rate and rhythm, no murmur ABD: abdomen is soft, nontender.  no hepatosplenomegaly.  not distended.  no hernia MUSCULOSKELETAL: muscle bulk and strength are grossly normal.  no obvious joint swelling.  gait is normal and steady EXTEMITIES: no deformity.  no ulcer on the feet.  feet are of normal color and temp.  Trace bilat leg edema PULSES: dorsalis pedis intact bilat.  no carotid bruit NEURO:  cn 2-12 grossly intact.   readily moves all 4's.  sensation is intact to touch on the feet SKIN:  Normal texture and temperature.  No rash or suspicious lesion is visible.   NODES:  None palpable at the neck PSYCH: alert, well-oriented.  Does not appear anxious nor depressed.   outside test results are reviewed: FTI=0.8  Lab Results  Component Value Date   TSH 0.86 06/14/2015  radiol: thyroid US(03/23/14): Bilateral nodules. 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 Fatigue and other sxs, not thyroid-related.   Patient is advised the following: Patient Instructions  A thyroid blood test is requested for you today.  We'll let you know about the results.

## 2015-06-14 NOTE — Patient Instructions (Signed)
A thyroid blood test is requested for you today.  We'll let you know about the results.    

## 2015-06-28 ENCOUNTER — Other Ambulatory Visit: Payer: Self-pay | Admitting: Internal Medicine

## 2015-07-01 ENCOUNTER — Telehealth: Payer: Self-pay | Admitting: Endocrinology

## 2015-07-01 NOTE — Telephone Encounter (Signed)
I contacted the pt and left a voicemail advised her recent thyroid blood test were all normal. Requested call back if the pt would like to discuss.

## 2015-07-01 NOTE — Telephone Encounter (Signed)
Patient is calling for the results of her lab work °

## 2015-08-13 ENCOUNTER — Other Ambulatory Visit: Payer: Self-pay | Admitting: Internal Medicine

## 2015-09-02 ENCOUNTER — Telehealth: Payer: Self-pay | Admitting: Internal Medicine

## 2015-09-02 NOTE — Telephone Encounter (Signed)
Pt called request to transfer from Dr. [name] to Dr. Sharlet Salina, pt stated she really want to get a physical done.

## 2015-09-02 NOTE — Telephone Encounter (Signed)
Ok with me 

## 2015-09-02 NOTE — Telephone Encounter (Signed)
Fine with me but may be a little while until she can get in for physical given all the transfer patients.

## 2015-09-12 ENCOUNTER — Other Ambulatory Visit: Payer: Self-pay | Admitting: Internal Medicine

## 2015-09-16 ENCOUNTER — Ambulatory Visit (INDEPENDENT_AMBULATORY_CARE_PROVIDER_SITE_OTHER): Payer: 59 | Admitting: Internal Medicine

## 2015-09-16 ENCOUNTER — Encounter: Payer: Self-pay | Admitting: Internal Medicine

## 2015-09-16 ENCOUNTER — Other Ambulatory Visit (INDEPENDENT_AMBULATORY_CARE_PROVIDER_SITE_OTHER): Payer: 59

## 2015-09-16 VITALS — BP 128/78 | HR 69 | Temp 98.9°F | Resp 12 | Ht 67.0 in | Wt 150.0 lb

## 2015-09-16 DIAGNOSIS — Z0000 Encounter for general adult medical examination without abnormal findings: Secondary | ICD-10-CM | POA: Diagnosis not present

## 2015-09-16 DIAGNOSIS — C4359 Malignant melanoma of other part of trunk: Secondary | ICD-10-CM | POA: Diagnosis not present

## 2015-09-16 DIAGNOSIS — E042 Nontoxic multinodular goiter: Secondary | ICD-10-CM | POA: Diagnosis not present

## 2015-09-16 LAB — COMPREHENSIVE METABOLIC PANEL
ALBUMIN: 4 g/dL (ref 3.5–5.2)
ALT: 15 U/L (ref 0–35)
AST: 15 U/L (ref 0–37)
Alkaline Phosphatase: 113 U/L (ref 39–117)
BUN: 12 mg/dL (ref 6–23)
CALCIUM: 8.5 mg/dL (ref 8.4–10.5)
CHLORIDE: 93 meq/L — AB (ref 96–112)
CO2: 29 mEq/L (ref 19–32)
Creatinine, Ser: 0.67 mg/dL (ref 0.40–1.20)
GFR: 96.2 mL/min (ref >60.00)
Glucose, Bld: 78 mg/dL (ref 70–99)
POTASSIUM: 4.6 meq/L (ref 3.5–5.1)
Sodium: 127 mEq/L — ABNORMAL LOW (ref 135–145)
Total Bilirubin: 0.2 mg/dL (ref 0.2–1.2)
Total Protein: 6.7 g/dL (ref 6.0–8.3)

## 2015-09-16 LAB — LIPID PANEL
CHOLESTEROL: 205 mg/dL — AB (ref 0–200)
HDL: 86 mg/dL (ref >39.00)
LDL Cholesterol: 101 mg/dL — ABNORMAL HIGH (ref 0–99)
NonHDL: 119.37
Total CHOL/HDL Ratio: 2
Triglycerides: 91 mg/dL (ref 0.0–149.0)
VLDL: 18.2 mg/dL (ref 0.0–40.0)

## 2015-09-16 LAB — CBC
HEMATOCRIT: 35.9 % — AB (ref 36.0–46.0)
HEMOGLOBIN: 11.7 g/dL — AB (ref 12.0–15.0)
MCHC: 32.5 g/dL (ref 30.0–36.0)
MCV: 86.6 fl (ref 78.0–100.0)
Platelets: 258 10*3/uL (ref 150.0–400.0)
RBC: 4.14 Mil/uL (ref 3.87–5.11)
RDW: 13 % (ref 11.5–15.5)
WBC: 8.1 10*3/uL (ref 4.0–10.5)

## 2015-09-16 LAB — HEMOGLOBIN A1C: HEMOGLOBIN A1C: 5.6 % (ref 4.6–6.5)

## 2015-09-16 NOTE — Patient Instructions (Signed)
We will check the labs today and call you back with the results.   We are checking the hepatitis C screening test.   Health Maintenance, Female Adopting a healthy lifestyle and getting preventive care can go a long way to promote health and wellness. Talk with your health care provider about what schedule of regular examinations is right for you. This is a good chance for you to check in with your provider about disease prevention and staying healthy. In between checkups, there are plenty of things you can do on your own. Experts have done a lot of research about which lifestyle changes and preventive measures are most likely to keep you healthy. Ask your health care provider for more information. WEIGHT AND DIET  Eat a healthy diet  Be sure to include plenty of vegetables, fruits, low-fat dairy products, and lean protein.  Do not eat a lot of foods high in solid fats, added sugars, or salt.  Get regular exercise. This is one of the most important things you can do for your health.  Most adults should exercise for at least 150 minutes each week. The exercise should increase your heart rate and make you sweat (moderate-intensity exercise).  Most adults should also do strengthening exercises at least twice a week. This is in addition to the moderate-intensity exercise.  Maintain a healthy weight  Body mass index (BMI) is a measurement that can be used to identify possible weight problems. It estimates body fat based on height and weight. Your health care provider can help determine your BMI and help you achieve or maintain a healthy weight.  For females 58 years of age and older:   A BMI below 18.5 is considered underweight.  A BMI of 18.5 to 24.9 is normal.  A BMI of 25 to 29.9 is considered overweight.  A BMI of 30 and above is considered obese.  Watch levels of cholesterol and blood lipids  You should start having your blood tested for lipids and cholesterol at 58 years of age,  then have this test every 5 years.  You may need to have your cholesterol levels checked more often if:  Your lipid or cholesterol levels are high.  You are older than 58 years of age.  You are at high risk for heart disease.  CANCER SCREENING   Lung Cancer  Lung cancer screening is recommended for adults 59-24 years old who are at high risk for lung cancer because of a history of smoking.  A yearly low-dose CT scan of the lungs is recommended for people who:  Currently smoke.  Have quit within the past 15 years.  Have at least a 30-pack-year history of smoking. A pack year is smoking an average of one pack of cigarettes a day for 1 year.  Yearly screening should continue until it has been 15 years since you quit.  Yearly screening should stop if you develop a health problem that would prevent you from having lung cancer treatment.  Breast Cancer  Practice breast self-awareness. This means understanding how your breasts normally appear and feel.  It also means doing regular breast self-exams. Let your health care provider know about any changes, no matter how small.  If you are in your 20s or 30s, you should have a clinical breast exam (CBE) by a health care provider every 1-3 years as part of a regular health exam.  If you are 46 or older, have a CBE every year. Also consider having a breast X-ray (  mammogram) every year.  If you have a family history of breast cancer, talk to your health care provider about genetic screening.  If you are at high risk for breast cancer, talk to your health care provider about having an MRI and a mammogram every year.  Breast cancer gene (BRCA) assessment is recommended for women who have family members with BRCA-related cancers. BRCA-related cancers include:  Breast.  Ovarian.  Tubal.  Peritoneal cancers.  Results of the assessment will determine the need for genetic counseling and BRCA1 and BRCA2 testing. Cervical Cancer Your  health care provider may recommend that you be screened regularly for cancer of the pelvic organs (ovaries, uterus, and vagina). This screening involves a pelvic examination, including checking for microscopic changes to the surface of your cervix (Pap test). You may be encouraged to have this screening done every 3 years, beginning at age 34.  For women ages 68-65, health care providers may recommend pelvic exams and Pap testing every 3 years, or they may recommend the Pap and pelvic exam, combined with testing for human papilloma virus (HPV), every 5 years. Some types of HPV increase your risk of cervical cancer. Testing for HPV may also be done on women of any age with unclear Pap test results.  Other health care providers may not recommend any screening for nonpregnant women who are considered low risk for pelvic cancer and who do not have symptoms. Ask your health care provider if a screening pelvic exam is right for you.  If you have had past treatment for cervical cancer or a condition that could lead to cancer, you need Pap tests and screening for cancer for at least 20 years after your treatment. If Pap tests have been discontinued, your risk factors (such as having a new sexual partner) need to be reassessed to determine if screening should resume. Some women have medical problems that increase the chance of getting cervical cancer. In these cases, your health care provider may recommend more frequent screening and Pap tests. Colorectal Cancer  This type of cancer can be detected and often prevented.  Routine colorectal cancer screening usually begins at 58 years of age and continues through 58 years of age.  Your health care provider may recommend screening at an earlier age if you have risk factors for colon cancer.  Your health care provider may also recommend using home test kits to check for hidden blood in the stool.  A small camera at the end of a tube can be used to examine your  colon directly (sigmoidoscopy or colonoscopy). This is done to check for the earliest forms of colorectal cancer.  Routine screening usually begins at age 18.  Direct examination of the colon should be repeated every 5-10 years through 59 years of age. However, you may need to be screened more often if early forms of precancerous polyps or small growths are found. Skin Cancer  Check your skin from head to toe regularly.  Tell your health care provider about any new moles or changes in moles, especially if there is a change in a mole's shape or color.  Also tell your health care provider if you have a mole that is larger than the size of a pencil eraser.  Always use sunscreen. Apply sunscreen liberally and repeatedly throughout the day.  Protect yourself by wearing long sleeves, pants, a wide-brimmed hat, and sunglasses whenever you are outside. HEART DISEASE, DIABETES, AND HIGH BLOOD PRESSURE   High blood pressure causes heart disease  and increases the risk of stroke. High blood pressure is more likely to develop in:  People who have blood pressure in the high end of the normal range (130-139/85-89 mm Hg).  People who are overweight or obese.  People who are African American.  If you are 54-49 years of age, have your blood pressure checked every 3-5 years. If you are 51 years of age or older, have your blood pressure checked every year. You should have your blood pressure measured twice--once when you are at a hospital or clinic, and once when you are not at a hospital or clinic. Record the average of the two measurements. To check your blood pressure when you are not at a hospital or clinic, you can use:  An automated blood pressure machine at a pharmacy.  A home blood pressure monitor.  If you are between 71 years and 12 years old, ask your health care provider if you should take aspirin to prevent strokes.  Have regular diabetes screenings. This involves taking a blood sample to  check your fasting blood sugar level.  If you are at a normal weight and have a low risk for diabetes, have this test once every three years after 58 years of age.  If you are overweight and have a high risk for diabetes, consider being tested at a younger age or more often. PREVENTING INFECTION  Hepatitis B  If you have a higher risk for hepatitis B, you should be screened for this virus. You are considered at high risk for hepatitis B if:  You were born in a country where hepatitis B is common. Ask your health care provider which countries are considered high risk.  Your parents were born in a high-risk country, and you have not been immunized against hepatitis B (hepatitis B vaccine).  You have HIV or AIDS.  You use needles to inject street drugs.  You live with someone who has hepatitis B.  You have had sex with someone who has hepatitis B.  You get hemodialysis treatment.  You take certain medicines for conditions, including cancer, organ transplantation, and autoimmune conditions. Hepatitis C  Blood testing is recommended for:  Everyone born from [birth year] through 1963.  Anyone with known risk factors for hepatitis C. Sexually transmitted infections (STIs)  You should be screened for sexually transmitted infections (STIs) including gonorrhea and chlamydia if:  You are sexually active and are younger than 58 years of age.  You are older than 58 years of age and your health care provider tells you that you are at risk for this type of infection.  Your sexual activity has changed since you were last screened and you are at an increased risk for chlamydia or gonorrhea. Ask your health care provider if you are at risk.  If you do not have HIV, but are at risk, it may be recommended that you take a prescription medicine daily to prevent HIV infection. This is called pre-exposure prophylaxis (PrEP). You are considered at risk if:  You are sexually active and do not regularly use  condoms or know the HIV status of your partner(s).  You take drugs by injection.  You are sexually active with a partner who has HIV. Talk with your health care provider about whether you are at high risk of being infected with HIV. If you choose to begin PrEP, you should first be tested for HIV. You should then be tested every 3 months for as long as you are taking PrEP.  PREGNANCY   If you are premenopausal and you may become pregnant, ask your health care provider about preconception counseling.  If you may become pregnant, take 400 to 800 micrograms (mcg) of folic acid every day.  If you want to prevent pregnancy, talk to your health care provider about birth control (contraception). OSTEOPOROSIS AND MENOPAUSE   Osteoporosis is a disease in which the bones lose minerals and strength with aging. This can result in serious bone fractures. Your risk for osteoporosis can be identified using a bone density scan.  If you are 47 years of age or older, or if you are at risk for osteoporosis and fractures, ask your health care provider if you should be screened.  Ask your health care provider whether you should take a calcium or vitamin D supplement to lower your risk for osteoporosis.  Menopause may have certain physical symptoms and risks.  Hormone replacement therapy may reduce some of these symptoms and risks. Talk to your health care provider about whether hormone replacement therapy is right for you.  HOME CARE INSTRUCTIONS   Schedule regular health, dental, and eye exams.  Stay current with your immunizations.   Do not use any tobacco products including cigarettes, chewing tobacco, or electronic cigarettes.  If you are pregnant, do not drink alcohol.  If you are breastfeeding, limit how much and how often you drink alcohol.  Limit alcohol intake to no more than 1 drink per day for nonpregnant women. One drink equals 12 ounces of beer, 5 ounces of wine, or 1 ounces of hard  liquor.  Do not use street drugs.  Do not share needles.  Ask your health care provider for help if you need support or information about quitting drugs.  Tell your health care provider if you often feel depressed.  Tell your health care provider if you have ever been abused or do not feel safe at home.   This information is not intended to replace advice given to you by your health care provider. Make sure you discuss any questions you have with your health care provider.   Document Released: 02/26/2011 Document Revised: 09/03/2014 Document Reviewed: 07/15/2013 Elsevier Interactive Patient Education [employer].

## 2015-09-16 NOTE — Progress Notes (Signed)
Pre visit review using our clinic review tool, if applicable. No additional management support is needed unless otherwise documented below in the visit note. 

## 2015-09-17 LAB — HEPATITIS C ANTIBODY: HCV AB: NEGATIVE

## 2015-09-18 ENCOUNTER — Encounter: Payer: Self-pay | Admitting: Internal Medicine

## 2015-09-18 NOTE — Assessment & Plan Note (Signed)
Was excised and sees dermatology for surveillance.

## 2015-09-18 NOTE — Assessment & Plan Note (Signed)
Colonoscopy up to date, checking labs and hepatitis C screening today. Immunizations up to date. Counseled on sun safety given her previous skin cancer. Given screening recommendations.

## 2015-09-18 NOTE — Progress Notes (Signed)
   Subjective:    Patient ID: Sheryl Lester, female    DOB: 10-30-1955, 60 y.o.   MRN: [id number]  HPI The patient is a 60 YO female coming in for wellness. No new concerns.   PMH, [facility], social history reviewed and updated.   Review of Systems  Constitutional: Negative for fever, activity change, appetite change, fatigue and unexpected weight change.  HENT: Negative.   Eyes: Negative.   Respiratory: Negative for cough, chest tightness, shortness of breath and wheezing.   Cardiovascular: Negative for chest pain, palpitations and leg swelling.  Gastrointestinal: Negative for nausea, diarrhea, constipation and abdominal distention.  Musculoskeletal: Positive for arthralgias.  Skin: Negative.   Neurological:       Trigeminal neuralgia.   Psychiatric/Behavioral: Negative.       Objective:   Physical Exam  Constitutional: She is oriented to person, place, and time. She appears well-developed and well-nourished.  HENT:  Head: Normocephalic and atraumatic.  Eyes: EOM are normal.  Neck: Normal range of motion.  Cardiovascular: Normal rate and regular rhythm.   Pulmonary/Chest: Effort normal and breath sounds normal. No respiratory distress. She has no wheezes. She has no rales.  Abdominal: Soft. Bowel sounds are normal. She exhibits no distension. There is no tenderness. There is no rebound.  Musculoskeletal: She exhibits no edema.  Neurological: She is alert and oriented to person, place, and time. Coordination normal.  Skin: Skin is warm and dry.  Psychiatric: She has a normal mood and affect.   Filed Vitals:   09/16/15 1431  BP: 128/78  Pulse: 69  Temp: 98.9 F (37.2 C)  TempSrc: Oral  Resp: 12  Height: 5\' 7"  (1.702 m)  Weight: 150 lb (68.04 kg)  SpO2: 98%      Assessment & Plan:

## 2015-09-18 NOTE — Assessment & Plan Note (Signed)
Tsh and free T4 recently checked and normal.

## 2015-10-18 ENCOUNTER — Other Ambulatory Visit: Payer: Self-pay | Admitting: Internal Medicine

## 2015-11-30 ENCOUNTER — Other Ambulatory Visit: Payer: Self-pay | Admitting: Internal Medicine

## 2015-12-01 ENCOUNTER — Telehealth: Payer: Self-pay | Admitting: *Deleted

## 2015-12-01 NOTE — Telephone Encounter (Signed)
Patient is going to schedule an office visit with Dr. Sharlet Salina to discuss low sodium levels.

## 2015-12-01 NOTE — Telephone Encounter (Signed)
Left msg on triage requesting to speak with MD nurse concerning her labs...Johny Chess

## 2015-12-12 ENCOUNTER — Inpatient Hospital Stay (HOSPITAL_COMMUNITY)
Admission: EM | Admit: 2015-12-12 | Discharge: 2015-12-15 | DRG: 419 | Disposition: A | Discharge status: Home / Self Care | Payer: 59 | Attending: General Surgery | Admitting: General Surgery

## 2015-12-12 ENCOUNTER — Ambulatory Visit (HOSPITAL_COMMUNITY)
Admission: EM | Admit: 2015-12-12 | Discharge: 2015-12-12 | Disposition: A | Discharge status: Nursing Home (Includes ICF) | Payer: 59 | Source: Home / Self Care | Attending: Family Medicine | Admitting: Family Medicine

## 2015-12-12 ENCOUNTER — Emergency Department (HOSPITAL_COMMUNITY): Payer: 59

## 2015-12-12 ENCOUNTER — Encounter (HOSPITAL_COMMUNITY): Payer: Self-pay | Admitting: *Deleted

## 2015-12-12 ENCOUNTER — Encounter (HOSPITAL_COMMUNITY): Payer: Self-pay | Admitting: Nurse Practitioner

## 2015-12-12 DIAGNOSIS — K8000 Calculus of gallbladder with acute cholecystitis without obstruction: Principal | ICD-10-CM | POA: Diagnosis present

## 2015-12-12 DIAGNOSIS — Z8673 Personal history of transient ischemic attack (TIA), and cerebral infarction without residual deficits: Secondary | ICD-10-CM | POA: Diagnosis not present

## 2015-12-12 DIAGNOSIS — R1011 Right upper quadrant pain: Secondary | ICD-10-CM | POA: Diagnosis not present

## 2015-12-12 DIAGNOSIS — K8010 Calculus of gallbladder with chronic cholecystitis without obstruction: Secondary | ICD-10-CM | POA: Diagnosis present

## 2015-12-12 DIAGNOSIS — E785 Hyperlipidemia, unspecified: Secondary | ICD-10-CM | POA: Diagnosis present

## 2015-12-12 DIAGNOSIS — G4731 Primary central sleep apnea: Secondary | ICD-10-CM | POA: Diagnosis present

## 2015-12-12 DIAGNOSIS — K810 Acute cholecystitis: Secondary | ICD-10-CM

## 2015-12-12 DIAGNOSIS — F419 Anxiety disorder, unspecified: Secondary | ICD-10-CM | POA: Diagnosis present

## 2015-12-12 DIAGNOSIS — F329 Major depressive disorder, single episode, unspecified: Secondary | ICD-10-CM | POA: Diagnosis present

## 2015-12-12 DIAGNOSIS — I10 Essential (primary) hypertension: Secondary | ICD-10-CM | POA: Diagnosis present

## 2015-12-12 DIAGNOSIS — H9190 Unspecified hearing loss, unspecified ear: Secondary | ICD-10-CM | POA: Diagnosis present

## 2015-12-12 DIAGNOSIS — Z85820 Personal history of malignant melanoma of skin: Secondary | ICD-10-CM

## 2015-12-12 HISTORY — DX: Migraine, unspecified, not intractable, without status migrainosus: G43.909

## 2015-12-12 HISTORY — DX: Malignant melanoma of skin, unspecified: C43.9

## 2015-12-12 HISTORY — DX: Tubulo-interstitial nephritis, not specified as acute or chronic: N12

## 2015-12-12 HISTORY — DX: Personal history of other mental and behavioral disorders: Z86.59

## 2015-12-12 HISTORY — DX: Bipolar disorder, unspecified: F31.9

## 2015-12-12 HISTORY — DX: Obstructive sleep apnea (adult) (pediatric): G47.33

## 2015-12-12 HISTORY — DX: Adverse effect of unspecified anesthetic, initial encounter: T41.45XA

## 2015-12-12 LAB — COMPREHENSIVE METABOLIC PANEL
ALK PHOS: 91 U/L (ref 38–126)
ALT: 22 U/L (ref 14–54)
AST: 19 U/L (ref 15–41)
Albumin: 3.6 g/dL (ref 3.5–5.0)
Anion gap: 14 (ref 5–15)
BILIRUBIN TOTAL: 0.9 mg/dL (ref 0.3–1.2)
BUN: 11 mg/dL (ref 6–20)
CALCIUM: 9 mg/dL (ref 8.9–10.3)
CO2: 23 mmol/L (ref 22–32)
CREATININE: 0.74 mg/dL (ref 0.44–1.00)
Chloride: 95 mmol/L — ABNORMAL LOW (ref 101–111)
Glucose, Bld: 92 mg/dL (ref 65–99)
Potassium: 4 mmol/L (ref 3.5–5.1)
Sodium: 132 mmol/L — ABNORMAL LOW (ref 135–145)
Total Protein: 7 g/dL (ref 6.5–8.1)

## 2015-12-12 LAB — URINALYSIS W MICROSCOPIC (NOT AT ARMC)
Glucose, UA: NEGATIVE mg/dL
KETONES UR: 40 mg/dL — AB
Nitrite: NEGATIVE
PH: 6 (ref 5.0–8.0)
Protein, ur: 30 mg/dL — AB
SPECIFIC GRAVITY, URINE: 1.023 (ref 1.005–1.030)

## 2015-12-12 LAB — CBC WITH DIFFERENTIAL/PLATELET
Basophils Absolute: 0 10*3/uL (ref 0.0–0.1)
Basophils Relative: 0 %
EOS PCT: 1 %
Eosinophils Absolute: 0.1 10*3/uL (ref 0.0–0.7)
HEMATOCRIT: 38.1 % (ref 36.0–46.0)
Hemoglobin: 12.7 g/dL (ref 12.0–15.0)
LYMPHS ABS: 1 10*3/uL (ref 0.7–4.0)
LYMPHS PCT: 11 %
MCH: 28.1 pg (ref 26.0–34.0)
MCHC: 33.3 g/dL (ref 30.0–36.0)
MCV: 84.3 fL (ref 78.0–100.0)
Monocytes Absolute: 0.8 10*3/uL (ref 0.1–1.0)
Monocytes Relative: 10 %
NEUTROS ABS: 6.7 10*3/uL (ref 1.7–7.7)
Neutrophils Relative %: 78 %
PLATELETS: 231 10*3/uL (ref 150–400)
RBC: 4.52 MIL/uL (ref 3.87–5.11)
RDW: 12.3 % (ref 11.5–15.5)
WBC: 8.6 10*3/uL (ref 4.0–10.5)

## 2015-12-12 LAB — LIPASE, BLOOD: LIPASE: 16 U/L (ref 11–51)

## 2015-12-12 MED ORDER — PANTOPRAZOLE SODIUM 40 MG IV SOLR
40.0000 mg | Freq: Every day | INTRAVENOUS | Status: DC
  Administered 2015-12-12 – 2015-12-13 (×2): 40 mg via INTRAVENOUS
  Filled 2015-12-12 (×2): qty 40

## 2015-12-12 MED ORDER — HYDROMORPHONE HCL 1 MG/ML IJ SOLN: 1.0000 mg | Freq: Once | INTRAMUSCULAR | Status: DC

## 2015-12-12 MED ORDER — AMPHETAMINE-DEXTROAMPHETAMINE 10 MG PO TABS
20.0000 mg | ORAL_TABLET | ORAL | Status: DC
  Administered 2015-12-15: 20 mg via ORAL
  Filled 2015-12-12 (×3): qty 2

## 2015-12-12 MED ORDER — SODIUM CHLORIDE 0.9 % IV BOLUS (SEPSIS)
1000.0000 mL | Freq: Once | INTRAVENOUS | Status: AC
  Administered 2015-12-12: 1000 mL via INTRAVENOUS

## 2015-12-12 MED ORDER — ONDANSETRON 4 MG PO TBDP: 4.0000 mg | ORAL_TABLET | Freq: Four times a day (QID) | ORAL | Status: DC | PRN

## 2015-12-12 MED ORDER — METHOCARBAMOL 500 MG PO TABS
500.0000 mg | ORAL_TABLET | Freq: Four times a day (QID) | ORAL | Status: DC | PRN
  Administered 2015-12-13 (×2): 500 mg via ORAL
  Filled 2015-12-12 (×2): qty 1

## 2015-12-12 MED ORDER — DEXTROSE 5 % IV SOLN
2.0000 g | INTRAVENOUS | Status: DC
  Administered 2015-12-13 – 2015-12-14 (×2): 2 g via INTRAVENOUS
  Filled 2015-12-12 (×3): qty 2

## 2015-12-12 MED ORDER — KCL IN DEXTROSE-NACL 20-5-0.45 MEQ/L-%-% IV SOLN
INTRAVENOUS | Status: DC
  Administered 2015-12-12 – 2015-12-14 (×5): via INTRAVENOUS
  Filled 2015-12-12 (×6): qty 1000

## 2015-12-12 MED ORDER — METOPROLOL TARTRATE 50 MG PO TABS
50.0000 mg | ORAL_TABLET | Freq: Two times a day (BID) | ORAL | Status: DC
  Administered 2015-12-12 – 2015-12-15 (×6): 50 mg via ORAL
  Filled 2015-12-12 (×6): qty 1

## 2015-12-12 MED ORDER — HYDROMORPHONE HCL 1 MG/ML IJ SOLN
1.0000 mg | INTRAMUSCULAR | Status: DC | PRN
  Administered 2015-12-12 – 2015-12-14 (×11): 1 mg via INTRAVENOUS
  Filled 2015-12-12 (×11): qty 1

## 2015-12-12 MED ORDER — ONDANSETRON HCL 4 MG/2ML IJ SOLN
4.0000 mg | Freq: Four times a day (QID) | INTRAMUSCULAR | Status: DC | PRN
  Administered 2015-12-14: 4 mg via INTRAVENOUS
  Filled 2015-12-12: qty 2

## 2015-12-12 MED ORDER — CARBAMAZEPINE ER 300 MG PO CP12: 300.0000 mg | ORAL_CAPSULE | Freq: Two times a day (BID) | ORAL | Status: DC

## 2015-12-12 MED ORDER — CLONAZEPAM 1 MG PO TABS
2.0000 mg | ORAL_TABLET | Freq: Every day | ORAL | Status: DC
  Administered 2015-12-12 – 2015-12-14 (×3): 2 mg via ORAL
  Filled 2015-12-12 (×3): qty 2

## 2015-12-12 MED ORDER — DIPHENHYDRAMINE HCL 25 MG PO CAPS: 25.0000 mg | ORAL_CAPSULE | Freq: Four times a day (QID) | ORAL | Status: DC | PRN

## 2015-12-12 MED ORDER — DIPHENHYDRAMINE HCL 50 MG/ML IJ SOLN
25.0000 mg | Freq: Four times a day (QID) | INTRAMUSCULAR | Status: DC | PRN
  Administered 2015-12-12 – 2015-12-14 (×2): 25 mg via INTRAVENOUS
  Filled 2015-12-12 (×2): qty 1

## 2015-12-12 MED ORDER — HYDROMORPHONE HCL 1 MG/ML IJ SOLN
1.0000 mg | Freq: Once | INTRAMUSCULAR | Status: AC
  Administered 2015-12-12: 1 mg via INTRAVENOUS
  Filled 2015-12-12: qty 1

## 2015-12-12 MED ORDER — ONDANSETRON HCL 4 MG/2ML IJ SOLN
4.0000 mg | Freq: Once | INTRAMUSCULAR | Status: AC
  Administered 2015-12-12: 4 mg via INTRAVENOUS
  Filled 2015-12-12: qty 2

## 2015-12-12 MED ORDER — CLONAZEPAM 1 MG PO TABS
1.0000 mg | ORAL_TABLET | Freq: Every morning | ORAL | Status: DC
  Administered 2015-12-14 – 2015-12-15 (×2): 1 mg via ORAL
  Filled 2015-12-12 (×3): qty 1

## 2015-12-12 MED ORDER — DEXTROSE 5 % IV SOLN
2.0000 g | Freq: Once | INTRAVENOUS | Status: AC
  Administered 2015-12-12: 2 g via INTRAVENOUS
  Filled 2015-12-12: qty 2

## 2015-12-12 NOTE — ED Notes (Signed)
she c/o 3 day history RLQ abd pain, progressively worse since onset. She reports nausea, poor appetite, fevers. She reports decreased urinary output. She reports a normal BM yesterday.

## 2015-12-12 NOTE — ED Provider Notes (Addendum)
CSN: [id number]     Arrival date & time 12/12/15  1451 History   First MD Initiated Contact with Patient 12/12/15 1528     Chief Complaint  Patient presents with  . Abdominal Pain     (Consider location/radiation/quality/duration/timing/severity/associated sxs/prior Treatment) HPI 60 year old female who presents with right upper quadrant abdominal pain. History of hypertension, hyperlipidemia, Central sleep apnea, peripheral vascular disease, and trigeminal neuralgia. Also surgical history of abdominal hysterectomy, appendectomy, and a femoral hernia repair. States that 3 nights ago developed sudden onset of right sided abdominal pain that woke her up from sleep. States that it occurred a few hours after eating ice cream. Has not had postprandial pain in the past like this. States that pain has been sharp and constant over the past 3 days and associated with nausea and decreased appetite. Has not had any vomiting, fevers, chills, night sweats, diarrhea, or constipation. No abdominal distention, dysuria, or urinary frequency. No chest pain or difficulty breathing. Past Medical History  Diagnosis Date  . ALLERGIC RHINITIS 01/20/2008  . ANEMIA-IRON DEFICIENCY 01/20/2008  . ANXIETY 01/20/2008  . ASTHMATIC BRONCHITIS, ACUTE 05/19/2008  . CENTRAL SLEEP APNEA CONDS CLASSIFIED ELSEWHERE 08/31/2010  . DEPRESSION 01/20/2008  . DYSPHAGIA UNSPECIFIED 12/06/2008  . ELEVATED BLOOD PRESSURE WITHOUT DIAGNOSIS OF HYPERTENSION 12/06/2008  . GERD 12/06/2008  . HYPERLIPIDEMIA 01/20/2008  . HYPERTENSION 12/16/2008  . OTITIS MEDIA, ACUTE, BILATERAL 12/16/2008  . OTITIS MEDIA, SEROUS, CHRONIC 12/30/2008  . PVD 05/12/2010  . SINUSITIS- ACUTE-NOS 09/21/2008  . SWELLING MASS OR LUMP IN HEAD AND NECK 09/21/2008  . TIA 12/16/2008  . Unspecified hearing loss 09/21/2008  . VENOUS INSUFFICIENCY, LEGS 05/03/2010  . THYROID NODULE, LEFT 01/07/2009  . Trigeminal neuralgia 01/20/2008  . Cancer [facility]) 2012    melanoma   Past Surgical  History  Procedure Laterality Date  . Abdominal hysterectomy    . Microvascular decompression       for chronic facial pain/ [facility]  . Oophorectomy    . Appendectomy    . Breast surgery      Left breast biopsy, Benign  . Abdominal exploration surgery  07/1986    Hernia repair-femoral   Family History  Problem Relation Age of Onset  . Hyperlipidemia Mother   . Hypertension Mother   . Diabetes Father   . Melanoma Father   . Melanoma Sister   . Breast cancer Paternal Aunt   . Heart disease Paternal Grandmother   . Heart disease Other     Mother side of family-several uncles   . Colon cancer Neg Hx    Social History  Substance Use Topics  . Smoking status: Never Smoker   . Smokeless tobacco: Never Used  . Alcohol Use: No   OB History    No data available     Review of Systems 10/14 systems reviewed and are negative other than those stated in the HPI   Allergies  Aripiprazole; Avapro; and Sulfamethoxazole-trimethoprim  Home Medications   Prior to Admission medications   Medication Sig Start Date End Date Taking? Authorizing Provider  amphetamine-dextroamphetamine (ADDERALL) 20 MG tablet Take 20 mg by mouth every morning.  09/01/15  Yes Historical Provider, MD  aspirin 81 MG EC tablet Take 81 mg by mouth every morning.    Yes Historical Provider, MD  Buprenorphine (BUTRANS) 20 MCG/HR PTWK Place 20 mcg onto the skin every Tuesday.    Yes Historical Provider, MD  BUTRANS 10 MCG/HR PTWK patch Take 10 mcg by mouth daily as needed (for  pain).  11/30/15  Yes Historical Provider, MD  Cholecalciferol (VITAMIN D) 2000 units tablet Take 2,000 Units by mouth every morning.    Yes Historical Provider, MD  clonazePAM (KLONOPIN) 1 MG tablet 1 tablet in the am, and 2 tablets at night 03/07/14  Yes Historical Provider, MD  desvenlafaxine (PRISTIQ) 100 MG 24 hr tablet Take 100 mg by mouth daily. 11/08/15  Yes Historical Provider, MD  EQUETRO 300 MG CP12 Take 300-600 mg by mouth 2 (two) times  daily. 600 mg in the morning and 300 mg in the evening 11/30/15  Yes Historical Provider, MD  metoprolol (LOPRESSOR) 50 MG tablet TAKE 1 TABLET (50 MG TOTAL) BY MOUTH 2 (TWO) TIMES DAILY.   Yes Biagio Borg, MD  NONFORMULARY OR COMPOUNDED ITEM Take 1 application by mouth 4 (four) times daily as needed. Topically on cheek and inside mouth of mouth to calm trigeminal nerve 10/31/15  Yes Historical Provider, MD  NUCYNTA 100 MG TABS Take 250 mg by mouth 3 (three) times daily. 12/09/15  Yes Historical Provider, MD   BP 153/120 mmHg  Pulse 103  Resp 18  SpO2 97% Physical Exam Physical Exam  Nursing note and vitals reviewed. Constitutional: Well developed, well nourished, non-toxic, and appears uncomfortable secondary to pain no edema Head: Normocephalic and atraumatic.  Mouth/Throat: Oropharynx is clear and moist.  Neck: Normal range of motion. Neck supple.  Cardiovascular: Normal rate and regular rhythm.   Pulmonary/Chest: Effort normal and breath sounds normal.  Abdominal: Soft. No distention. Right-sided abdominal tenderness to palpation worse in the right upper quadrant with positive Murphy sign. There is no rebound and no guarding.  Musculoskeletal: Normal range of motion.  Neurological: Alert, no facial droop, fluent speech, moves all extremities symmetrically Skin: Skin is warm and dry.  Psychiatric: Cooperative  ED Course  Procedures (including critical care time) Labs Review Labs Reviewed  COMPREHENSIVE METABOLIC PANEL - Abnormal; Notable for the following:    Sodium 132 (*)    Chloride 95 (*)    All other components within normal limits  URINALYSIS W MICROSCOPIC (NOT AT [facility]) - Abnormal; Notable for the following:    Color, Urine AMBER (*)    APPearance HAZY (*)    Hgb urine dipstick TRACE (*)    Bilirubin Urine MODERATE (*)    Ketones, ur 40 (*)    Protein, ur 30 (*)    Leukocytes, UA MODERATE (*)    Bacteria, UA FEW (*)    Squamous Epithelial / LPF 0-5 (*)    Casts GRANULAR  CAST (*)    All other components within normal limits  CBC WITH DIFFERENTIAL/PLATELET  LIPASE, BLOOD    Imaging Review US Abdomen Limited Ruq  12/12/2015  CLINICAL DATA:  Right upper quadrant pain EXAM: US ABDOMEN LIMITED - RIGHT UPPER QUADRANT COMPARISON:  CT abdomen and pelvis August 04, 2004 FINDINGS: Gallbladder: Within the gallbladder, there is sludge and multiple echogenic foci which move and shadow consistent with gallstones. Largest gallstone measures 1.2 cm in length. There is gallbladder wall edema with mild pericholecystic fluid. Patient is tender over the gallbladder. Common bile duct: Diameter: 3 mm. No intrahepatic or extrahepatic biliary duct dilatation. Liver: No focal lesion identified. Within normal limits in parenchymal echogenicity. IMPRESSION: Evidence of acute cholecystitis. Electronically Signed   By: Lowella Grip III M.D.   On: 12/12/2015 16:58   I have personally reviewed and evaluated these images and lab results as part of my medical decision-making.   EKG Interpretation None  EKG 12/12/2015 17:09:26 Sinus tachycardia, ST depression in inferior leads not seen in 2014 HR 100 RR 600 PR140 QTc 435 QRS 90 MDM   Final diagnoses:  RUQ pain  Acute cholecystitis    60 year old female who presents with 2-3 days of persistent right upper quadrant abdominal pain. Is afebrile and hemodynamically stable on presentation. Is nontoxic but very uncomfortable secondary to pain. Has a positive Murphy sign but non-peritoneal abdomen. No leukocytosis on exam, with normal LFTs and lipase. Right upper quadrant ultrasound concerning for acute cholecystitis. Discussed with Dr. Grandville Silos from general surgery who will admit for operative management. She is NPO and has received IV fluids and ceftriaxone.    Forde Dandy, MD 12/12/15 [place] Makinna Andy, MD 12/12/15 [id number]

## 2015-12-12 NOTE — ED Notes (Addendum)
Pt arrives from [facility] via [facility]. Pt has c/o RLQ pain x2 days. Pt endorses nausea, but denies emesis and diarrhea. Pt endorses tenderness with palpation and pain a 10/10.

## 2015-12-12 NOTE — H&P (Signed)
Sheryl Lester is an 60 y.o. female.   Chief Complaint: Right upper quadrant abdominal pain  HPI: Sheryl Lester developed right upper quadrant abdominal pain Friday night. It persisted over the weekend. She has had some mild nausea but no vomiting. She is felt very full associated with the pain in her right upper quadrant so she has not been eating. She has been drinking intermittently. She came to the emergency department for further evaluation and was found to have normal white blood cell count, normal lipase, and normal liver function tests. However, right upper quadrant ultrasound was consistent with cholecystitis with cholelithiasis. I was asked to see her for surgical management.  Past Medical History  Diagnosis Date  . ALLERGIC RHINITIS 01/20/2008  . ANEMIA-IRON DEFICIENCY 01/20/2008  . ANXIETY 01/20/2008  . ASTHMATIC BRONCHITIS, ACUTE 05/19/2008  . CENTRAL SLEEP APNEA CONDS CLASSIFIED ELSEWHERE 08/31/2010  . DEPRESSION 01/20/2008  . DYSPHAGIA UNSPECIFIED 12/06/2008  . ELEVATED BLOOD PRESSURE WITHOUT DIAGNOSIS OF HYPERTENSION 12/06/2008  . GERD 12/06/2008  . HYPERLIPIDEMIA 01/20/2008  . HYPERTENSION 12/16/2008  . OTITIS MEDIA, ACUTE, BILATERAL 12/16/2008  . OTITIS MEDIA, SEROUS, CHRONIC 12/30/2008  . PVD 05/12/2010  . SINUSITIS- ACUTE-NOS 09/21/2008  . SWELLING MASS OR LUMP IN HEAD AND NECK 09/21/2008  . TIA 12/16/2008  . Unspecified hearing loss 09/21/2008  . VENOUS INSUFFICIENCY, LEGS 05/03/2010  . THYROID NODULE, LEFT 01/07/2009  . Trigeminal neuralgia 01/20/2008  . Cancer [facility]) 2012    melanoma    Past Surgical History  Procedure Laterality Date  . Abdominal hysterectomy    . Microvascular decompression       for chronic facial pain/ [facility]  . Oophorectomy    . Appendectomy    . Breast surgery      Left breast biopsy, Benign  . Abdominal exploration surgery  07/1986    Hernia repair-femoral    Family History  Problem Relation Age of Onset  . Hyperlipidemia Mother   . Hypertension Mother    . Diabetes Father   . Melanoma Father   . Melanoma Sister   . Breast cancer Paternal Aunt   . Heart disease Paternal Grandmother   . Heart disease Other     Mother side of family-several uncles   . Colon cancer Neg Hx    Social History:  reports that she has never smoked. She has never used smokeless tobacco. She reports that she does not drink alcohol or use illicit drugs.  Allergies:  Allergies  Allergen Reactions  . Aripiprazole Rash and Swelling  . Avapro [Irbesartan] Other (See Comments)    hyponatremia  . Sulfamethoxazole-Trimethoprim Nausea And Vomiting     (Not in a hospital admission)  Results for orders placed or performed during the hospital encounter of 12/12/15 (from the past 48 hour(s))  CBC with Differential     Status: None   Collection Time: 12/12/15  3:30 PM  Result Value Ref Range   WBC 8.6 4.0 - 10.5 K/uL   RBC 4.52 3.87 - 5.11 MIL/uL   Hemoglobin 12.7 12.0 - 15.0 g/dL   HCT 38.1 36.0 - 46.0 %   MCV 84.3 78.0 - 100.0 fL   MCH 28.1 26.0 - 34.0 pg   MCHC 33.3 30.0 - 36.0 g/dL   RDW 12.3 11.5 - 15.5 %   Platelets 231 150 - 400 K/uL   Neutrophils Relative % 78 %   Neutro Abs 6.7 1.7 - 7.7 K/uL   Lymphocytes Relative 11 %   Lymphs Abs 1.0 0.7 - 4.0  K/uL   Monocytes Relative 10 %   Monocytes Absolute 0.8 0.1 - 1.0 K/uL   Eosinophils Relative 1 %   Eosinophils Absolute 0.1 0.0 - 0.7 K/uL   Basophils Relative 0 %   Basophils Absolute 0.0 0.0 - 0.1 K/uL  Comprehensive metabolic panel     Status: Abnormal   Collection Time: 12/12/15  3:30 PM  Result Value Ref Range   Sodium 132 (L) 135 - 145 mmol/L   Potassium 4.0 3.5 - 5.1 mmol/L   Chloride 95 (L) 101 - 111 mmol/L   CO2 23 22 - 32 mmol/L   Glucose, Bld 92 65 - 99 mg/dL   BUN 11 6 - 20 mg/dL   Creatinine, Ser 0.74 0.44 - 1.00 mg/dL   Calcium 9.0 8.9 - 10.3 mg/dL   Total Protein 7.0 6.5 - 8.1 g/dL   Albumin 3.6 3.5 - 5.0 g/dL   AST 19 15 - 41 U/L   ALT 22 14 - 54 U/L   Alkaline Phosphatase 91 38  - 126 U/L   Total Bilirubin 0.9 0.3 - 1.2 mg/dL   GFR calc non Af Amer >60 >60 mL/min   GFR calc Af Amer >60 >60 mL/min    Comment: (NOTE) The eGFR has been calculated using the CKD EPI equation. This calculation has not been validated in all clinical situations. eGFR's persistently <60 mL/min signify possible Chronic Kidney Disease.    Anion gap 14 5 - 15  Lipase, blood     Status: None   Collection Time: 12/12/15  3:30 PM  Result Value Ref Range   Lipase 16 11 - 51 U/L  Urinalysis with microscopic (not at [facility])     Status: Abnormal   Collection Time: 12/12/15  4:08 PM  Result Value Ref Range   Color, Urine AMBER (A) YELLOW    Comment: BIOCHEMICALS MAY BE AFFECTED BY COLOR   APPearance HAZY (A) CLEAR   Specific Gravity, Urine 1.023 1.005 - 1.030   pH 6.0 5.0 - 8.0   Glucose, UA NEGATIVE NEGATIVE mg/dL   Hgb urine dipstick TRACE (A) NEGATIVE   Bilirubin Urine MODERATE (A) NEGATIVE   Ketones, ur 40 (A) NEGATIVE mg/dL   Protein, ur 30 (A) NEGATIVE mg/dL   Nitrite NEGATIVE NEGATIVE   Leukocytes, UA MODERATE (A) NEGATIVE   WBC, UA 6-30 0 - 5 WBC/hpf   RBC / HPF 0-5 0 - 5 RBC/hpf   Bacteria, UA FEW (A) NONE SEEN   Squamous Epithelial / LPF 0-5 (A) NONE SEEN   Casts GRANULAR CAST (A) NEGATIVE    Comment: HYALINE CASTS   Urine-Other MUCOUS PRESENT    US Abdomen Limited Ruq  12/12/2015  CLINICAL DATA:  Right upper quadrant pain EXAM: US ABDOMEN LIMITED - RIGHT UPPER QUADRANT COMPARISON:  CT abdomen and pelvis August 04, 2004 FINDINGS: Gallbladder: Within the gallbladder, there is sludge and multiple echogenic foci which move and shadow consistent with gallstones. Largest gallstone measures 1.2 cm in length. There is gallbladder wall edema with mild pericholecystic fluid. Patient is tender over the gallbladder. Common bile duct: Diameter: 3 mm. No intrahepatic or extrahepatic biliary duct dilatation. Liver: No focal lesion identified. Within normal limits in parenchymal echogenicity.  IMPRESSION: Evidence of acute cholecystitis. Electronically Signed   By: Lowella Grip III M.D.   On: 12/12/2015 16:58    Review of Systems  Constitutional: Negative for fever.  HENT: Negative.   Eyes: Negative.   Respiratory: Negative for cough.   Cardiovascular: Negative for  chest pain.  Gastrointestinal: Positive for nausea and abdominal pain. Negative for vomiting and diarrhea.  Genitourinary: Negative.   Musculoskeletal: Negative.   Skin: Negative.   Neurological: Negative.   Endo/Heme/Allergies: Negative.   Psychiatric/Behavioral: Positive for depression.    Blood pressure 133/100, pulse 82, resp. rate 18, SpO2 99 %. Physical Exam  Constitutional: She is oriented to person, place, and time. She appears well-developed and well-nourished. No distress.  HENT:  Head: Normocephalic.  Right Ear: External ear normal.  Left Ear: External ear normal.  Nose: Nose normal.  Mouth/Throat: Oropharynx is clear and moist. No oropharyngeal exudate.  Eyes: Conjunctivae and EOM are normal. Pupils are equal, round, and reactive to light.  Neck: Normal range of motion. Neck supple. No tracheal deviation present.  Cardiovascular: Normal rate, normal heart sounds and intact distal pulses.   Respiratory: Effort normal and breath sounds normal. No stridor. No respiratory distress. She has no wheezes. She has no rales.  GI: Soft. She exhibits no distension. There is tenderness. There is no rebound and no guarding.  Tender in the right upper quadrant without peritoneal signs, no generalized tenderness, bowel sounds are hypoactive  Musculoskeletal: Normal range of motion.  Neurological: She is alert and oriented to person, place, and time. She exhibits normal muscle tone.  Skin: Skin is warm.  Psychiatric: She has a normal mood and affect.     Assessment/Plan Cholecystitis with cholelithiasis - IV Rocephin, nothing by mouth after midnight, will plan laparoscopic cholecystectomy with possible  cholangiogram this admission. I discussed the procedure, risks, and benefits with her. I also called her father who is a retired Engineer, drilling on the phone per her request. CPAP at [facility] - home machine. I will discuss her care with Dr. Kieth Brightly in the morning.  Zenovia Jarred, MD 12/12/2015, 6:39 PM

## 2015-12-12 NOTE — ED Notes (Signed)
Patient transported to Ultrasound 

## 2015-12-12 NOTE — Progress Notes (Signed)
Patient has home CPAP that has been set up. Patient is wearing CPAP at this time.

## 2015-12-12 NOTE — ED Provider Notes (Signed)
CSN: [id number]     Arrival date & time 12/12/15  1301 History   First MD Initiated Contact with Patient 12/12/15 1333     No chief complaint on file.  (Consider location/radiation/quality/duration/timing/severity/associated sxs/prior Treatment) HPI History obtained from patient: Location: right upper abdomen Context/Duration: sudden onset Friday night, no relief of symptoms Saturday or Sunday Severity:8-9  Quality: Timing:  constant          Home Treatment: rx meds.  Associated symptoms:  No appetite, nausea  Past Medical History  Diagnosis Date  . ALLERGIC RHINITIS 01/20/2008  . ANEMIA-IRON DEFICIENCY 01/20/2008  . ANXIETY 01/20/2008  . ASTHMATIC BRONCHITIS, ACUTE 05/19/2008  . CENTRAL SLEEP APNEA CONDS CLASSIFIED ELSEWHERE 08/31/2010  . DEPRESSION 01/20/2008  . DYSPHAGIA UNSPECIFIED 12/06/2008  . ELEVATED BLOOD PRESSURE WITHOUT DIAGNOSIS OF HYPERTENSION 12/06/2008  . GERD 12/06/2008  . HYPERLIPIDEMIA 01/20/2008  . HYPERTENSION 12/16/2008  . OTITIS MEDIA, ACUTE, BILATERAL 12/16/2008  . OTITIS MEDIA, SEROUS, CHRONIC 12/30/2008  . PVD 05/12/2010  . SINUSITIS- ACUTE-NOS 09/21/2008  . SWELLING MASS OR LUMP IN HEAD AND NECK 09/21/2008  . TIA 12/16/2008  . Unspecified hearing loss 09/21/2008  . VENOUS INSUFFICIENCY, LEGS 05/03/2010  . THYROID NODULE, LEFT 01/07/2009  . Trigeminal neuralgia 01/20/2008  . Cancer [facility]) 2012    melanoma   Past Surgical History  Procedure Laterality Date  . Abdominal hysterectomy    . Microvascular decompression       for chronic facial pain/ [facility]  . Oophorectomy    . Appendectomy    . Breast surgery      Left breast biopsy, Benign  . Abdominal exploration surgery  07/1986    Hernia repair-femoral   Family History  Problem Relation Age of Onset  . Hyperlipidemia Mother   . Hypertension Mother   . Diabetes Father   . Melanoma Father   . Melanoma Sister   . Breast cancer Paternal Aunt   . Heart disease Paternal Grandmother   . Heart disease Other    Mother side of family-several uncles   . Colon cancer Neg Hx    Social History  Substance Use Topics  . Smoking status: Never Smoker   . Smokeless tobacco: Never Used  . Alcohol Use: No   OB History    No data available     Review of Systems Acute abdominal pain Allergies  Aripiprazole; Avapro; and Sulfamethoxazole-trimethoprim  Home Medications   Prior to Admission medications   Medication Sig Start Date End Date Taking? Authorizing Provider  amphetamine-dextroamphetamine (ADDERALL) 20 MG tablet Take 20 mg by mouth daily.  09/01/15   Historical Provider, MD  aspirin 81 MG EC tablet Take by mouth.    Historical Provider, MD  Buprenorphine (BUTRANS) 20 MCG/HR PTWK Place 20 mcg onto the skin once a week.     Historical Provider, MD  carbamazepine (ANTIPSYCHOTIC - EQUETRO) 200 MG CP12 Take 300 mg by mouth 2 (two) times daily.    Historical Provider, MD  Cholecalciferol (VITAMIN D) 2000 units tablet Take by mouth.    Historical Provider, MD  clonazePAM (KLONOPIN) 1 MG tablet 1 tablet in the am, and 2 tablets at night 03/07/14   Historical Provider, MD  metoprolol (LOPRESSOR) 50 MG tablet TAKE 1 TABLET (50 MG TOTAL) BY MOUTH 2 (TWO) TIMES DAILY.    Biagio Borg, MD  metoprolol (LOPRESSOR) 50 MG tablet TAKE 1 TABLET BY MOUTH TWICE A DAY 11/30/15   Biagio Borg, MD  PRISTIQ 100 MG 24 hr tablet Take  100 mg by mouth daily.  09/01/15   Historical Provider, MD  tapentadol (NUCYNTA) 50 MG TABS Take 100 mg by mouth every 6 (six) hours as needed.     Historical Provider, MD   Meds Ordered and Administered this Visit  Medications - No data to display  BP 141/114 mmHg  Pulse 126  Temp(Src) 97.9 F (36.6 C) (Oral)  Resp 18  SpO2 98% No data found.   Physical Exam  Constitutional: She is oriented to person, place, and time. She appears well-developed and well-nourished. She appears distressed.  HENT:  Head: Normocephalic and atraumatic.  Eyes: Conjunctivae are normal.  Neck: Normal range of  motion. Neck supple.  Abdominal: There is tenderness. There is rebound and guarding. There is no rigidity.    Neurological: She is alert and oriented to person, place, and time.  Nursing note and vitals reviewed.  ED Course  Procedures (including critical care time)  Labs Review Labs Reviewed - No data to display  Imaging Review No results found.   Visual Acuity Review  Right Eye Distance:   Left Eye Distance:   Bilateral Distance:    Right Eye Near:   Left Eye Near:    Bilateral Near:         MDM  No diagnosis found.   PT is advised to go to the ED for treatment.  Her symptoms are beyond the level of care provided in the UC.      Konrad Felix, [place] 12/12/15 1352

## 2015-12-13 ENCOUNTER — Encounter (HOSPITAL_COMMUNITY): Admission: EM | Disposition: A | Discharge status: Home / Self Care | Payer: Self-pay | Source: Home / Self Care

## 2015-12-13 ENCOUNTER — Inpatient Hospital Stay (HOSPITAL_COMMUNITY): Payer: 59 | Admitting: Certified Registered Nurse Anesthetist

## 2015-12-13 HISTORY — PX: CHOLECYSTECTOMY: SHX55

## 2015-12-13 LAB — CBC
HCT: 34.9 % — ABNORMAL LOW (ref 36.0–46.0)
HEMOGLOBIN: 11.6 g/dL — AB (ref 12.0–15.0)
MCH: 28.2 pg (ref 26.0–34.0)
MCHC: 33.2 g/dL (ref 30.0–36.0)
MCV: 84.9 fL (ref 78.0–100.0)
Platelets: 251 10*3/uL (ref 150–400)
RBC: 4.11 MIL/uL (ref 3.87–5.11)
RDW: 12.3 % (ref 11.5–15.5)
WBC: 8.2 10*3/uL (ref 4.0–10.5)

## 2015-12-13 LAB — COMPREHENSIVE METABOLIC PANEL
ALBUMIN: 3 g/dL — AB (ref 3.5–5.0)
ALK PHOS: 87 U/L (ref 38–126)
ALT: 29 U/L (ref 14–54)
ANION GAP: 9 (ref 5–15)
AST: 50 U/L — ABNORMAL HIGH (ref 15–41)
BUN: 5 mg/dL — ABNORMAL LOW (ref 6–20)
CALCIUM: 8.6 mg/dL — AB (ref 8.9–10.3)
CHLORIDE: 99 mmol/L — AB (ref 101–111)
CO2: 26 mmol/L (ref 22–32)
Creatinine, Ser: 0.6 mg/dL (ref 0.44–1.00)
GFR calc Af Amer: >60 mL/min (ref >60)
GFR calc non Af Amer: >60 mL/min (ref >60)
GLUCOSE: 131 mg/dL — AB (ref 65–99)
Potassium: 4.4 mmol/L (ref 3.5–5.1)
SODIUM: 134 mmol/L — AB (ref 135–145)
Total Bilirubin: 0.5 mg/dL (ref 0.3–1.2)
Total Protein: 6.4 g/dL — ABNORMAL LOW (ref 6.5–8.1)

## 2015-12-13 LAB — SURGICAL PCR SCREEN
MRSA, PCR: NEGATIVE
Staphylococcus aureus: NEGATIVE

## 2015-12-13 SURGERY — LAPAROSCOPIC CHOLECYSTECTOMY WITH INTRAOPERATIVE CHOLANGIOGRAM
Anesthesia: General | Site: Abdomen

## 2015-12-13 SURGERY — LAPAROSCOPIC CHOLECYSTECTOMY
Anesthesia: General | Site: Abdomen

## 2015-12-13 MED ORDER — KETAMINE HCL 100 MG/ML IJ SOLN
INTRAMUSCULAR | Status: DC | PRN
  Administered 2015-12-13: 50 mg via INTRAVENOUS

## 2015-12-13 MED ORDER — OXYCODONE HCL 5 MG PO TABS
10.0000 mg | ORAL_TABLET | ORAL | Status: DC | PRN
  Administered 2015-12-14 – 2015-12-15 (×4): 10 mg via ORAL
  Filled 2015-12-13 (×4): qty 2

## 2015-12-13 MED ORDER — 0.9 % SODIUM CHLORIDE (POUR BTL) OPTIME
TOPICAL | Status: DC | PRN
  Administered 2015-12-13: 1000 mL

## 2015-12-13 MED ORDER — BUPIVACAINE-EPINEPHRINE 0.25% -1:200000 IJ SOLN
INTRAMUSCULAR | Status: DC | PRN
  Administered 2015-12-13: 30 mL

## 2015-12-13 MED ORDER — SUGAMMADEX SODIUM 200 MG/2ML IV SOLN
INTRAVENOUS | Status: DC | PRN
  Administered 2015-12-13: 120 mg via INTRAVENOUS

## 2015-12-13 MED ORDER — PROPOFOL 10 MG/ML IV BOLUS
INTRAVENOUS | Status: DC | PRN
  Administered 2015-12-13: 150 mg via INTRAVENOUS
  Administered 2015-12-13: 30 mg via INTRAVENOUS
  Administered 2015-12-13: 20 mg via INTRAVENOUS

## 2015-12-13 MED ORDER — HYDROMORPHONE HCL 1 MG/ML IJ SOLN
0.2500 mg | INTRAMUSCULAR | Status: DC | PRN
  Administered 2015-12-13 (×2): 0.5 mg via INTRAVENOUS

## 2015-12-13 MED ORDER — HYDROMORPHONE HCL 1 MG/ML IJ SOLN
INTRAMUSCULAR | Status: AC
  Filled 2015-12-13: qty 2

## 2015-12-13 MED ORDER — ROCURONIUM BROMIDE 100 MG/10ML IV SOLN
INTRAVENOUS | Status: DC | PRN
  Administered 2015-12-13: 40 mg via INTRAVENOUS
  Administered 2015-12-13 (×2): 10 mg via INTRAVENOUS

## 2015-12-13 MED ORDER — SUGAMMADEX SODIUM 200 MG/2ML IV SOLN
INTRAVENOUS | Status: AC
  Filled 2015-12-13: qty 2

## 2015-12-13 MED ORDER — FENTANYL CITRATE (PF) 100 MCG/2ML IJ SOLN
INTRAMUSCULAR | Status: DC | PRN
  Administered 2015-12-13: 150 ug via INTRAVENOUS
  Administered 2015-12-13: 100 ug via INTRAVENOUS

## 2015-12-13 MED ORDER — ACETAMINOPHEN 10 MG/ML IV SOLN
INTRAVENOUS | Status: DC | PRN
  Administered 2015-12-13: 1000 mg via INTRAVENOUS

## 2015-12-13 MED ORDER — LIDOCAINE HCL (CARDIAC) 20 MG/ML IV SOLN
INTRAVENOUS | Status: AC
  Filled 2015-12-13: qty 10

## 2015-12-13 MED ORDER — FENTANYL CITRATE (PF) 250 MCG/5ML IJ SOLN
INTRAMUSCULAR | Status: AC
  Filled 2015-12-13: qty 5

## 2015-12-13 MED ORDER — ROCURONIUM BROMIDE 50 MG/5ML IV SOLN
INTRAVENOUS | Status: AC
  Filled 2015-12-13: qty 5

## 2015-12-13 MED ORDER — CARBAMAZEPINE ER 200 MG PO CP12
600.0000 mg | ORAL_CAPSULE | Freq: Every morning | ORAL | Status: DC
  Filled 2015-12-13: qty 3

## 2015-12-13 MED ORDER — BUPIVACAINE-EPINEPHRINE (PF) 0.25% -1:200000 IJ SOLN
INTRAMUSCULAR | Status: AC
  Filled 2015-12-13: qty 30

## 2015-12-13 MED ORDER — MIDAZOLAM HCL 2 MG/2ML IJ SOLN
INTRAMUSCULAR | Status: AC
  Filled 2015-12-13: qty 2

## 2015-12-13 MED ORDER — ACETAMINOPHEN 10 MG/ML IV SOLN
INTRAVENOUS | Status: AC
  Filled 2015-12-13: qty 100

## 2015-12-13 MED ORDER — LACTATED RINGERS IV SOLN: INTRAVENOUS | Status: DC

## 2015-12-13 MED ORDER — LACTATED RINGERS IV SOLN
INTRAVENOUS | Status: DC
  Administered 2015-12-13 (×2): via INTRAVENOUS

## 2015-12-13 MED ORDER — LIDOCAINE HCL (CARDIAC) 20 MG/ML IV SOLN
INTRAVENOUS | Status: DC | PRN
  Administered 2015-12-13: 60 mg via INTRAVENOUS

## 2015-12-13 MED ORDER — MIDAZOLAM HCL 5 MG/5ML IJ SOLN
INTRAMUSCULAR | Status: DC | PRN
  Administered 2015-12-13: 2 mg via INTRAVENOUS

## 2015-12-13 MED ORDER — SODIUM CHLORIDE 0.9 % IR SOLN
Status: DC | PRN
  Administered 2015-12-13: 1000 mL

## 2015-12-13 MED ORDER — KETAMINE HCL 100 MG/ML IJ SOLN
INTRAMUSCULAR | Status: AC
  Filled 2015-12-13: qty 1

## 2015-12-13 SURGICAL SUPPLY — 46 items
ADH SKN CLS APL DERMABOND .7 (GAUZE/BANDAGES/DRESSINGS) ×1
APPLIER CLIP ROT 10 11.4 M/L (STAPLE) ×2
APR CLP MED LRG 11.4X10 (STAPLE) ×1
BAG SPEC RTRVL 10 TROC 200 (ENDOMECHANICALS) ×1
CANISTER SUCTION 2500CC (MISCELLANEOUS) ×2 IMPLANT
CHLORAPREP W/TINT 26ML (MISCELLANEOUS) ×2 IMPLANT
CLIP APPLIE ROT 10 11.4 M/L (STAPLE) ×1 IMPLANT
COVER SURGICAL LIGHT HANDLE (MISCELLANEOUS) ×2 IMPLANT
DERMABOND ADVANCED (GAUZE/BANDAGES/DRESSINGS) ×1
DERMABOND ADVANCED .7 DNX12 (GAUZE/BANDAGES/DRESSINGS) IMPLANT
DEVICE TROCAR PUNCTURE CLOSURE (ENDOMECHANICALS) IMPLANT
DRAIN CHANNEL 19F RND (DRAIN) ×1 IMPLANT
ELECT REM PT RETURN 9FT ADLT (ELECTROSURGICAL) ×2
ELECTRODE REM PT RTRN 9FT ADLT (ELECTROSURGICAL) ×1 IMPLANT
ENDOLOOP SUT PDS II  0 18 (SUTURE) ×2
ENDOLOOP SUT PDS II 0 18 (SUTURE) IMPLANT
EVACUATOR SILICONE 100CC (DRAIN) ×1 IMPLANT
GAUZE SPONGE 4X4 12PLY STRL (GAUZE/BANDAGES/DRESSINGS) ×1 IMPLANT
GLOVE BIO SURGEON STRL SZ 6 (GLOVE) ×1 IMPLANT
GLOVE BIO SURGEON STRL SZ7 (GLOVE) ×1 IMPLANT
GLOVE BIOGEL PI IND STRL 6 (GLOVE) IMPLANT
GLOVE BIOGEL PI IND STRL 7.0 (GLOVE) ×1 IMPLANT
GLOVE BIOGEL PI INDICATOR 6 (GLOVE) ×1
GLOVE BIOGEL PI INDICATOR 7.0 (GLOVE) ×3
GLOVE SURG SS PI 7.0 STRL IVOR (GLOVE) ×2 IMPLANT
GOWN STRL REUS W/ TWL LRG LVL3 (GOWN DISPOSABLE) ×3 IMPLANT
GOWN STRL REUS W/TWL LRG LVL3 (GOWN DISPOSABLE) ×10
GRASPER SUT TROCAR 14GX15 (MISCELLANEOUS) ×2 IMPLANT
KIT BASIN OR (CUSTOM PROCEDURE TRAY) ×2 IMPLANT
KIT ROOM TURNOVER OR (KITS) ×2 IMPLANT
NS IRRIG 1000ML POUR BTL (IV SOLUTION) ×2 IMPLANT
PAD ARMBOARD 7.5X6 YLW CONV (MISCELLANEOUS) ×2 IMPLANT
POUCH RETRIEVAL ECOSAC 10 (ENDOMECHANICALS) ×1 IMPLANT
POUCH RETRIEVAL ECOSAC 10MM (ENDOMECHANICALS) ×1
SCISSORS LAP 5X35 DISP (ENDOMECHANICALS) ×2 IMPLANT
SET IRRIG TUBING LAPAROSCOPIC (IRRIGATION / IRRIGATOR) ×2 IMPLANT
SLEEVE ENDOPATH XCEL 5M (ENDOMECHANICALS) ×4 IMPLANT
SPECIMEN JAR SMALL (MISCELLANEOUS) ×2 IMPLANT
SUT ETHILON 2 0 FS 18 (SUTURE) ×1 IMPLANT
SUT MNCRL AB 4-0 PS2 18 (SUTURE) ×2 IMPLANT
SUT VICRYL 0 UR6 27IN ABS (SUTURE) ×1 IMPLANT
TOWEL OR 17X24 6PK STRL BLUE (TOWEL DISPOSABLE) ×2 IMPLANT
TRAY LAPAROSCOPIC MC (CUSTOM PROCEDURE TRAY) ×2 IMPLANT
TROCAR XCEL 12X100 BLDLESS (ENDOMECHANICALS) ×2 IMPLANT
TROCAR XCEL NON-BLD 5MMX100MML (ENDOMECHANICALS) ×2 IMPLANT
TUBING INSUFFLATION (TUBING) ×2 IMPLANT

## 2015-12-13 NOTE — Progress Notes (Signed)
Rt helped pt put her home CPAP unit on. Pt vitals stable. No distress noted at this time.

## 2015-12-13 NOTE — Transfer of Care (Signed)
Immediate Anesthesia Transfer of Care Note  Patient: Sheryl Lester  Procedure(s) Performed: Procedure(s): LAPAROSCOPIC CHOLECYSTECTOMY (N/A)  Patient Location: PACU  Anesthesia Type:General  Level of Consciousness: awake and alert   Airway & Oxygen Therapy: Patient Spontanous Breathing and Patient connected to nasal cannula oxygen  Post-op Assessment: Report given to RN, Post -op Vital signs reviewed and stable and Patient moving all extremities  Post vital signs: Reviewed and stable  Last Vitals:  Filed Vitals:   12/13/15 0900 12/13/15 1517  BP: 160/90 170/98  Pulse: 82 73  Temp: 37 C   Resp: 18 12    Complications: No apparent anesthesia complications

## 2015-12-13 NOTE — Anesthesia Preprocedure Evaluation (Signed)
Anesthesia Evaluation  Patient identified by MRN, date of birth, ID band Patient awake    Reviewed: Allergy & Precautions, NPO status , Patient's Chart, lab work & pertinent test results, reviewed documented beta blocker date and time   History of Anesthesia Complications Negative for: history of anesthetic complications  Airway Mallampati: II  TM Distance: >3 FB Neck ROM: Full    Dental  (+) Teeth Intact   Pulmonary sleep apnea and Continuous Positive Airway Pressure Ventilation ,  Bipap for central apnea   breath sounds clear to auscultation       Cardiovascular hypertension, Pt. on medications and Pt. on home beta blockers (-) angina+ Peripheral Vascular Disease  (-) Past MI and (-) CHF  Rhythm:Regular     Neuro/Psych  Headaches, PSYCHIATRIC DISORDERS Anxiety Depression Bipolar Disorder Chronic pain with narcotics for trigeminal neuralgia  Neuromuscular disease    GI/Hepatic Neg liver ROS, GERD  ,  Endo/Other  negative endocrine ROS  Renal/GU negative Renal ROS     Musculoskeletal   Abdominal   Peds  Hematology  (+) anemia ,   Anesthesia Other Findings   Reproductive/Obstetrics                             Anesthesia Physical Anesthesia Plan  ASA: III  Anesthesia Plan: General   Post-op Pain Management:    Induction: Intravenous  Airway Management Planned: Oral ETT  Additional Equipment: None  Intra-op Plan:   Post-operative Plan: Extubation in OR  Informed Consent: I have reviewed the patients History and Physical, chart, labs and discussed the procedure including the risks, benefits and alternatives for the proposed anesthesia with the patient or authorized representative who has indicated his/her understanding and acceptance.   Dental advisory given  Plan Discussed with: Surgeon and CRNA  Anesthesia Plan Comments:         Anesthesia Quick Evaluation

## 2015-12-13 NOTE — Anesthesia Procedure Notes (Signed)
Procedure Name: Intubation Date/Time: 12/13/2015 1:16 PM Performed by: Judeth Cornfield T Pre-anesthesia Checklist: Emergency Drugs available, Patient identified, Timeout performed, Patient being monitored and Suction available Patient Re-evaluated:Patient Re-evaluated prior to inductionOxygen Delivery Method: Circle system utilized Preoxygenation: Pre-oxygenation with 100% oxygen Intubation Type: IV induction Ventilation: Mask ventilation without difficulty Laryngoscope Size: Miller and 2 Grade View: Grade II Tube type: Oral Tube size: 7.0 mm Number of attempts: 1 Placement Confirmation: ETT inserted through vocal cords under direct vision,  breath sounds checked- equal and bilateral and positive ETCO2 Tube secured with: Tape Dental Injury: Teeth and Oropharynx as per pre-operative assessment

## 2015-12-13 NOTE — Op Note (Signed)
Preoperative diagnosis: acute cholecystitis  Postoperative diagnosis: Same   Procedure: laparoscopic cholecystectomy  Surgeon: Gurney Maxin, M.D.  Asst: none  Anesthesia: Gen.   Indications for procedure: Sheryl Lester is a 60 y.o. female with symptoms of RUQ pain, Nausea, Vomiting and Back pain consistent with gallbladder disease, Confirmed by Ultrasound and CT.  Description of procedure: The patient was brought into the operative suite, placed supine. Anesthesia was administered with endotracheal tube. Patient was strapped in place and foot board was secured. All pressure points were offloaded by foam padding. The patient was prepped and draped in the usual sterile fashion.  A small incision was made to the right of the umbilicus. A 93mm trocar was inserted into the peritoneal cavity with optical entry. Pneumoperitoneum was applied with high flow low pressure. 2 87mm trocars were placed in the RUQ. A 29mm trocar was placed in the subxiphoid space. All trocars sites were first anesthesized with 0.25% marcaine with epinephrine in the subcutaneous and preperitoneal layers. Next the patient was placed in reverse trendelenberg. The gallbladder was very distended with some omentum directly adhered. A laparoscopic needle was inserted and aspirated a large amount of reddish brown fluid. The omental adhesions were then dissected free with cautery.  The gallbladder was retracted cephalad and lateral. The peritoneum was reflected off the infundibulum working lateral to medial. The cystic duct and cystic artery were identified and further dissection revealed a critical view, due to concern for choledocholithiasis a cholangiogram was performed with [name].  The cystic duct and cystic artery were doubly clipped and ligated. The cystic duct was large and an 0 PDS endoloop was placed at the base to ensure adequate ensnaring.  The gallbladder was removed off the liver bed with cautery. The gallbladder  had a very thick rind and a posterior cystic artery was encounter during this maneuver. A clip was placed over the bleeding with hemostasis. The Gallbladder was placed in a specimen bag. The gallbladder fossa was irrigated and hemostasis was applied with cautery. The gallbladder was removed via the 63mm trocar. A 19Fr blake drain was placed through the right lateral port with tubing placed into the gallbladder fossa. The trocar site was enlarged and then closed with a running 0 vicryl. Pneumoperitoneum was removed, all trocar were removed. All incisions were closed with 4-0 monocryl subcuticular stitch. The patient woke from anesthesia and was brought to PACU in stable condition.  Findings: inflamed gallbladder  Specimen: gallbldadder  Blood loss: 215ml  Local anesthesia: 71ml 0.25% marcaine with epi  Complications: none  Gurney Maxin, M.D. General, Bariatric, & Minimally Invasive Surgery [facility]

## 2015-12-14 ENCOUNTER — Encounter (HOSPITAL_COMMUNITY): Payer: Self-pay | Admitting: General Surgery

## 2015-12-14 LAB — CBC
HEMATOCRIT: 35.2 % — AB (ref 36.0–46.0)
Hemoglobin: 11.9 g/dL — ABNORMAL LOW (ref 12.0–15.0)
MCH: 28.9 pg (ref 26.0–34.0)
MCHC: 33.8 g/dL (ref 30.0–36.0)
MCV: 85.4 fL (ref 78.0–100.0)
Platelets: 221 10*3/uL (ref 150–400)
RBC: 4.12 MIL/uL (ref 3.87–5.11)
RDW: 12.5 % (ref 11.5–15.5)
WBC: 7 10*3/uL (ref 4.0–10.5)

## 2015-12-14 LAB — COMPREHENSIVE METABOLIC PANEL
ALT: 41 U/L (ref 14–54)
ANION GAP: 10 (ref 5–15)
AST: 60 U/L — ABNORMAL HIGH (ref 15–41)
Albumin: 2.9 g/dL — ABNORMAL LOW (ref 3.5–5.0)
Alkaline Phosphatase: 78 U/L (ref 38–126)
BILIRUBIN TOTAL: 0.3 mg/dL (ref 0.3–1.2)
BUN: <5 mg/dL — ABNORMAL LOW (ref 6–20)
CALCIUM: 8.6 mg/dL — AB (ref 8.9–10.3)
CO2: 24 mmol/L (ref 22–32)
Chloride: 100 mmol/L — ABNORMAL LOW (ref 101–111)
Creatinine, Ser: 0.63 mg/dL (ref 0.44–1.00)
Glucose, Bld: 127 mg/dL — ABNORMAL HIGH (ref 65–99)
POTASSIUM: 3.9 mmol/L (ref 3.5–5.1)
Sodium: 134 mmol/L — ABNORMAL LOW (ref 135–145)
TOTAL PROTEIN: 5.8 g/dL — AB (ref 6.5–8.1)

## 2015-12-14 MED ORDER — CARBAMAZEPINE ER 300 MG PO CP12
600.0000 mg | ORAL_CAPSULE | Freq: Every day | ORAL | Status: DC
  Administered 2015-12-14 – 2015-12-15 (×2): 600 mg via ORAL
  Filled 2015-12-14 (×3): qty 2

## 2015-12-14 MED ORDER — CARBAMAZEPINE ER 300 MG PO CP12
300.0000 mg | ORAL_CAPSULE | Freq: Every day | ORAL | Status: DC
  Administered 2015-12-14: 300 mg via ORAL
  Filled 2015-12-14: qty 1

## 2015-12-14 MED ORDER — BUPRENORPHINE 20 MCG/HR TD PTWK
20.0000 ug | MEDICATED_PATCH | TRANSDERMAL | Status: DC
  Administered 2015-12-14: 20 ug via TRANSDERMAL

## 2015-12-14 MED ORDER — KETOROLAC TROMETHAMINE 15 MG/ML IJ SOLN
15.0000 mg | Freq: Four times a day (QID) | INTRAMUSCULAR | Status: DC
  Administered 2015-12-14 – 2015-12-15 (×6): 15 mg via INTRAVENOUS
  Filled 2015-12-14 (×6): qty 1

## 2015-12-14 MED ORDER — PANTOPRAZOLE SODIUM 40 MG PO TBEC
40.0000 mg | DELAYED_RELEASE_TABLET | Freq: Every day | ORAL | Status: DC
  Administered 2015-12-14: 40 mg via ORAL
  Filled 2015-12-14: qty 1

## 2015-12-14 NOTE — Anesthesia Postprocedure Evaluation (Signed)
Anesthesia Post Note  Patient: Sheryl Lester  Procedure(s) Performed: Procedure(s) (LRB): LAPAROSCOPIC CHOLECYSTECTOMY (N/A)  Patient location during evaluation: PACU Anesthesia Type: General Level of consciousness: awake Pain management: pain level controlled Vital Signs Assessment: post-procedure vital signs reviewed and stable Respiratory status: spontaneous breathing Cardiovascular status: stable Postop Assessment: no signs of nausea or vomiting Anesthetic complications: no    Last Vitals:  Filed Vitals:   12/14/15 1317 12/14/15 2132  BP: 107/75 126/72  Pulse: 71 60  Temp: 36.6 C 36.7 C  Resp: 17 18    Last Pain:  Filed Vitals:   12/14/15 2136  PainSc: Asleep                 Velia Pamer

## 2015-12-14 NOTE — Progress Notes (Signed)
Patient ID: Sheryl Lester, female   DOB: 27-Jan-1956, 60 y.o.   MRN: [id number]     [facility]      [address] [place], [place] [address]    Phone: [phone] FAX: [phone]     Subjective: Lots of pain.  No n/v. Tolerating clears. Labs are stable. jp drain with 100 ml serosanguinous output. Voiding.   Objective:  Vital signs:  Filed Vitals:   12/13/15 2133 12/13/15 2221 12/14/15 0214 12/14/15 0533  BP: 177/99  141/81 159/88  Pulse: 75 78 60 68  Temp: 98.2 F (36.8 C)  98.4 F (36.9 C) 98.9 F (37.2 C)  TempSrc: Oral  Oral Oral  Resp: '12 16 18 18  ' Height:      Weight:      SpO2: 99% 98% 98% 99%    Last BM Date: 12/12/15  Intake/Output   Yesterday:  04/18 0701 - 04/19 0700 In: 2370 [P.O.:720; I.V.:1650] Out: 2270 [Urine:2150; Drains:100; Blood:20] This shift: I/O last 3 completed shifts: In: 3170 [P.O.:720; I.V.:2450] Out: 2270 [Urine:2150; Drains:100; Blood:20]   Physical Exam: General: Pt awake/alert/oriented x4 in no acute distress Chest: cta  No chest wall pain w good excursion CV:  Pulses intact.  Regular rhythm MS: Normal AROM mjr joints.  No obvious deformity Abdomen: Soft.  Nondistended.  Tender over incisions.   No evidence of peritonitis.  No incarcerated hernias. Ext:  SCDs BLE.  No mjr edema.  No cyanosis Skin: No petechiae / purpura   Problem List:   Active Problems:   Cholecystitis with cholelithiasis    Results:   Labs: Results for orders placed or performed during the hospital encounter of 12/12/15 (from the past 48 hour(s))  CBC with Differential     Status: None   Collection Time: 12/12/15  3:30 PM  Result Value Ref Range   WBC 8.6 4.0 - 10.5 K/uL   RBC 4.52 3.87 - 5.11 MIL/uL   Hemoglobin 12.7 12.0 - 15.0 g/dL   HCT 38.1 36.0 - 46.0 %   MCV 84.3 78.0 - 100.0 fL   MCH 28.1 26.0 - 34.0 pg   MCHC 33.3 30.0 - 36.0 g/dL   RDW 12.3 11.5 - 15.5 %   Platelets 231 150 - 400  K/uL   Neutrophils Relative % 78 %   Neutro Abs 6.7 1.7 - 7.7 K/uL   Lymphocytes Relative 11 %   Lymphs Abs 1.0 0.7 - 4.0 K/uL   Monocytes Relative 10 %   Monocytes Absolute 0.8 0.1 - 1.0 K/uL   Eosinophils Relative 1 %   Eosinophils Absolute 0.1 0.0 - 0.7 K/uL   Basophils Relative 0 %   Basophils Absolute 0.0 0.0 - 0.1 K/uL  Comprehensive metabolic panel     Status: Abnormal   Collection Time: 12/12/15  3:30 PM  Result Value Ref Range   Sodium 132 (L) 135 - 145 mmol/L   Potassium 4.0 3.5 - 5.1 mmol/L   Chloride 95 (L) 101 - 111 mmol/L   CO2 23 22 - 32 mmol/L   Glucose, Bld 92 65 - 99 mg/dL   BUN 11 6 - 20 mg/dL   Creatinine, Ser 0.74 0.44 - 1.00 mg/dL   Calcium 9.0 8.9 - 10.3 mg/dL   Total Protein 7.0 6.5 - 8.1 g/dL   Albumin 3.6 3.5 - 5.0 g/dL   AST 19 15 - 41 U/L   ALT 22 14 - 54 U/L   Alkaline Phosphatase 91  38 - 126 U/L   Total Bilirubin 0.9 0.3 - 1.2 mg/dL   GFR calc non Af Amer >60 >60 mL/min   GFR calc Af Amer >60 >60 mL/min    Comment: (NOTE) The eGFR has been calculated using the CKD EPI equation. This calculation has not been validated in all clinical situations. eGFR's persistently <60 mL/min signify possible Chronic Kidney Disease.    Anion gap 14 5 - 15  Lipase, blood     Status: None   Collection Time: 12/12/15  3:30 PM  Result Value Ref Range   Lipase 16 11 - 51 U/L  Urinalysis with microscopic (not at [facility])     Status: Abnormal   Collection Time: 12/12/15  4:08 PM  Result Value Ref Range   Color, Urine AMBER (A) YELLOW    Comment: BIOCHEMICALS MAY BE AFFECTED BY COLOR   APPearance HAZY (A) CLEAR   Specific Gravity, Urine 1.023 1.005 - 1.030   pH 6.0 5.0 - 8.0   Glucose, UA NEGATIVE NEGATIVE mg/dL   Hgb urine dipstick TRACE (A) NEGATIVE   Bilirubin Urine MODERATE (A) NEGATIVE   Ketones, ur 40 (A) NEGATIVE mg/dL   Protein, ur 30 (A) NEGATIVE mg/dL   Nitrite NEGATIVE NEGATIVE   Leukocytes, UA MODERATE (A) NEGATIVE   WBC, UA 6-30 0 - 5 WBC/hpf    RBC / HPF 0-5 0 - 5 RBC/hpf   Bacteria, UA FEW (A) NONE SEEN   Squamous Epithelial / LPF 0-5 (A) NONE SEEN   Casts GRANULAR CAST (A) NEGATIVE    Comment: HYALINE CASTS   Urine-Other MUCOUS PRESENT   Surgical pcr screen     Status: None   Collection Time: 12/12/15 11:09 PM  Result Value Ref Range   MRSA, PCR NEGATIVE NEGATIVE   Staphylococcus aureus NEGATIVE NEGATIVE    Comment:        The Xpert SA Assay (FDA approved for NASAL specimens in patients over 90 years of age), is one component of a comprehensive surveillance program.  Test performance has been validated by [facility] for patients greater than or equal to 21 year old. It is not intended to diagnose infection nor to guide or monitor treatment.   Comprehensive metabolic panel     Status: Abnormal   Collection Time: 12/13/15  5:11 AM  Result Value Ref Range   Sodium 134 (L) 135 - 145 mmol/L   Potassium 4.4 3.5 - 5.1 mmol/L   Chloride 99 (L) 101 - 111 mmol/L   CO2 26 22 - 32 mmol/L   Glucose, Bld 131 (H) 65 - 99 mg/dL   BUN 5 (L) 6 - 20 mg/dL   Creatinine, Ser 0.60 0.44 - 1.00 mg/dL   Calcium 8.6 (L) 8.9 - 10.3 mg/dL   Total Protein 6.4 (L) 6.5 - 8.1 g/dL   Albumin 3.0 (L) 3.5 - 5.0 g/dL   AST 50 (H) 15 - 41 U/L   ALT 29 14 - 54 U/L   Alkaline Phosphatase 87 38 - 126 U/L   Total Bilirubin 0.5 0.3 - 1.2 mg/dL   GFR calc non Af Amer >60 >60 mL/min   GFR calc Af Amer >60 >60 mL/min    Comment: (NOTE) The eGFR has been calculated using the CKD EPI equation. This calculation has not been validated in all clinical situations. eGFR's persistently <60 mL/min signify possible Chronic Kidney Disease.    Anion gap 9 5 - 15  CBC     Status: Abnormal   Collection  Time: 12/13/15  5:11 AM  Result Value Ref Range   WBC 8.2 4.0 - 10.5 K/uL    Comment: WHITE COUNT CONFIRMED ON SMEAR   RBC 4.11 3.87 - 5.11 MIL/uL   Hemoglobin 11.6 (L) 12.0 - 15.0 g/dL   HCT 34.9 (L) 36.0 - 46.0 %   MCV 84.9 78.0 - 100.0 fL   MCH 28.2  26.0 - 34.0 pg   MCHC 33.2 30.0 - 36.0 g/dL   RDW 12.3 11.5 - 15.5 %   Platelets 251 150 - 400 K/uL  CBC     Status: Abnormal   Collection Time: 12/14/15  5:23 AM  Result Value Ref Range   WBC 7.0 4.0 - 10.5 K/uL   RBC 4.12 3.87 - 5.11 MIL/uL   Hemoglobin 11.9 (L) 12.0 - 15.0 g/dL   HCT 35.2 (L) 36.0 - 46.0 %   MCV 85.4 78.0 - 100.0 fL   MCH 28.9 26.0 - 34.0 pg   MCHC 33.8 30.0 - 36.0 g/dL   RDW 12.5 11.5 - 15.5 %   Platelets 221 150 - 400 K/uL  Comprehensive metabolic panel     Status: Abnormal   Collection Time: 12/14/15  5:23 AM  Result Value Ref Range   Sodium 134 (L) 135 - 145 mmol/L   Potassium 3.9 3.5 - 5.1 mmol/L   Chloride 100 (L) 101 - 111 mmol/L   CO2 24 22 - 32 mmol/L   Glucose, Bld 127 (H) 65 - 99 mg/dL   BUN <5 (L) 6 - 20 mg/dL   Creatinine, Ser 0.63 0.44 - 1.00 mg/dL   Calcium 8.6 (L) 8.9 - 10.3 mg/dL   Total Protein 5.8 (L) 6.5 - 8.1 g/dL   Albumin 2.9 (L) 3.5 - 5.0 g/dL   AST 60 (H) 15 - 41 U/L   ALT 41 14 - 54 U/L   Alkaline Phosphatase 78 38 - 126 U/L   Total Bilirubin 0.3 0.3 - 1.2 mg/dL   GFR calc non Af Amer >60 >60 mL/min   GFR calc Af Amer >60 >60 mL/min    Comment: (NOTE) The eGFR has been calculated using the CKD EPI equation. This calculation has not been validated in all clinical situations. eGFR's persistently <60 mL/min signify possible Chronic Kidney Disease.    Anion gap 10 5 - 15    Imaging / Studies: US Abdomen Limited Ruq  Dec 25, 2015  CLINICAL DATA:  Right upper quadrant pain EXAM: US ABDOMEN LIMITED - RIGHT UPPER QUADRANT COMPARISON:  CT abdomen and pelvis August 04, 2004 FINDINGS: Gallbladder: Within the gallbladder, there is sludge and multiple echogenic foci which move and shadow consistent with gallstones. Largest gallstone measures 1.2 cm in length. There is gallbladder wall edema with mild pericholecystic fluid. Patient is tender over the gallbladder. Common bile duct: Diameter: 3 mm. No intrahepatic or extrahepatic biliary duct  dilatation. Liver: No focal lesion identified. Within normal limits in parenchymal echogenicity. IMPRESSION: Evidence of acute cholecystitis. Electronically Signed   By: Lowella Grip III M.D.   On: 2015/12/25 16:58    Medications / Allergies:  Scheduled Meds: . amphetamine-dextroamphetamine  20 mg Oral [id number]  . carbamazepine  600 mg Oral q morning - 10a  . Carbamazepine  300-600 mg Oral BID  . cefTRIAXone (ROCEPHIN)  IV  2 g Intravenous Q24H  . clonazePAM  1 mg Oral q morning - 10a  . clonazePAM  2 mg Oral QHS  . ketorolac  15 mg Intravenous Q6H  . metoprolol  50  mg Oral BID  . pantoprazole (PROTONIX) IV  40 mg Intravenous QHS   Continuous Infusions: . dextrose 5 % and 0.45 % NaCl with KCl 20 mEq/L 100 mL/hr at 12/14/15 0645  . lactated ringers    . lactated ringers 50 mL/hr at 12/13/15 1204   PRN Meds:.diphenhydrAMINE **OR** diphenhydrAMINE, HYDROmorphone (DILAUDID) injection, methocarbamol, ondansetron **OR** ondansetron (ZOFRAN) IV, oxyCODONE  Antibiotics: Anti-infectives    Start     Dose/Rate Route Frequency Ordered Stop   12/13/15 1700  cefTRIAXone (ROCEPHIN) 2 g in dextrose 5 % 50 mL IVPB     2 g 100 mL/hr over 30 Minutes Intravenous Every 24 hours 12/12/15 2050     12/12/15 1715  cefTRIAXone (ROCEPHIN) 2 g in dextrose 5 % 50 mL IVPB     2 g 100 mL/hr over 30 Minutes Intravenous  Once 12/12/15 1712 12/12/15 1845        Assessment/Plan Acute cholecystitis POD#1 lap chole -add toradol, mobilize, IS, advance diet -continue with JP drain ID-rocephin FEN-clears, IVF until tolerating POs adequately Dispo-not ready for DC, pain  Erby Pian, ANP-BC [facility] Pager [phone](7A-4:30P) For consults and floor pages call [phone](7A-4:30P)  12/14/2015 8:15 AM

## 2015-12-15 MED ORDER — OXYCODONE HCL 10 MG PO TABS: 10.0000 mg | ORAL_TABLET | Freq: Four times a day (QID) | ORAL | Status: DC | PRN

## 2015-12-15 MED ORDER — OXYCODONE-ACETAMINOPHEN 5-325 MG PO TABS: 1.0000 | ORAL_TABLET | Freq: Four times a day (QID) | ORAL | Status: DC | PRN

## 2015-12-15 NOTE — Discharge Instructions (Signed)

## 2015-12-15 NOTE — Progress Notes (Signed)
Patient discharged to home. Discharge instructions were given to patient and a copy of the AVS was given to patient as well. Patient d/c'd via wheelchair accompanied by spouse. States a cab is taking her home. Patient agreed and verbalized understanding. No further questions at this moment.  Sheryl Lester n 12/15/2015 12:37 PM

## 2015-12-15 NOTE — Care Management Note (Signed)
Case Management Note  Patient Details  Name: Sheryl Lester MRN: [id number] Date of Birth: 1955-09-16  Subjective/Objective:                    Action/Plan:   Expected Discharge Date:                  Expected Discharge Plan:  Home/Self Care  In-House Referral:     Discharge planning Services     Post Acute Care Choice:    Choice offered to:     DME Arranged:    DME Agency:     HH Arranged:    [place] Agency:     Status of Service:  In process, will continue to follow  Medicare Important Message Given:    Date Medicare IM Given:    Medicare IM give by:    Date Additional Medicare IM Given:    Additional Medicare Important Message give by:     If discussed at [place] of Stay Meetings, dates discussed:    Additional Comments:  Marilu Favre, RN 12/15/2015, 7:28 AM

## 2015-12-15 NOTE — Discharge Summary (Signed)
Physician Discharge Summary  Patient ID: DALANIE CHINO MRN: [id number] DOB/AGE: 10/20/1955 60 y.o.  Admit date: 12/12/2015 Discharge date: 12/15/2015  Admitting Diagnosis: Cholecystitis with cholelithiasis  Discharge Diagnosis Patient Active Problem List   Diagnosis Date Noted  . Cholecystitis with cholelithiasis 12/12/2015  . 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 Osteopenia 12/23/2012  . Skin lesion 09/23/2012  . Hearing loss 09/23/2012  . Melanoma ([place]) 07/05/2011  . Preventative health care 03/01/2011  . PVD 05/12/2010  . 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 HYPERTENSION 12/16/2008  . TIA 12/16/2008  . GERD 12/06/2008  . Unspecified hearing loss 09/21/2008  . HYPERLIPIDEMIA 01/20/2008  . ANEMIA-IRON DEFICIENCY 01/20/2008  . ANXIETY 01/20/2008  . DEPRESSION 01/20/2008  . Trigeminal neuralgia 01/20/2008  . ALLERGIC RHINITIS 01/20/2008    Consultants none  Imaging: No results found.  Procedures Laparoscopic cholecystectomy---Dr. [facility] Course:  Sheryl Lester presented to [facility] with RUQ abdominal pain.  Workup showed acute cholecystitis on [place].  Patient was admitted and underwent procedure listed above.  Tolerated procedure well and was transferred to the floor.  Diet was advanced as tolerated.  On POD#1 she was having pain and only tolerating some clears, therefore kept inpatient.  maintained on SCDs and lovenox.  On POD#2, the patient was voiding well, tolerating diet, ambulating well, pain well controlled, vital signs stable, incisions c/d/i and felt stable for discharge home.  Drain was serosanguinous and therefore removed.  Spoke with her pain specialist, Deanna PA, recommends percocet, #40 tabs provided.  Further medication management per pain clinic.  Patient will follow up in our office in 3 weeks and knows to call with questions or concerns.  Physical Exam: General:  Alert, NAD, pleasant,  comfortable Abd:  Soft, ND, mild tenderness, incisions C/D/I, drain with minimal sanguinous drainage    Medication List    TAKE these medications        amphetamine-dextroamphetamine 20 MG tablet  Commonly known as:  ADDERALL  Take 20 mg by mouth every morning.     aspirin 81 MG EC tablet  Take 81 mg by mouth every morning.     BUTRANS 20 MCG/HR Ptwk patch  Generic drug:  buprenorphine  Place 20 mcg onto the skin every Tuesday.     BUTRANS 10 MCG/HR Ptwk patch  Generic drug:  buprenorphine  Take 10 mcg by mouth daily as needed (for pain).     clonazePAM 1 MG tablet  Commonly known as:  KLONOPIN  1 tablet in the am, and 2 tablets at night     desvenlafaxine 100 MG 24 hr tablet  Commonly known as:  PRISTIQ  Take 100 mg by mouth daily.     EQUETRO 300 MG Cp12  Generic drug:  Carbamazepine  Take 300-600 mg by mouth 2 (two) times daily. 600 mg in the morning and 300 mg in the evening     metoprolol 50 MG tablet  Commonly known as:  LOPRESSOR  TAKE 1 TABLET (50 MG TOTAL) BY MOUTH 2 (TWO) TIMES DAILY.     NONFORMULARY OR COMPOUNDED ITEM  Take 1 application by mouth 4 (four) times daily as needed. Topically on cheek and inside mouth of mouth to calm trigeminal nerve     NUCYNTA 100 MG Tabs  Generic drug:  Tapentadol HCl  Take 250 mg by mouth 3 (three) times daily.     oxyCODONE-acetaminophen 5-325 MG tablet  Commonly known as:  PERCOCET/ROXICET  Take 1-2 tablets by  mouth every 6 (six) hours as needed for severe pain.     Vitamin D 2000 units tablet  Take 2,000 Units by mouth every morning.             Follow-up Information    Schedule an appointment as soon as possible for a visit with Mickeal Skinner, MD.   Specialty:  General Surgery   Why:  post gallbladder surgery check   Contact information:   [place] [address] [phone]       Signed: Erby Pian, [facility] [phone]  12/15/2015,  8:11 AM

## 2016-01-18 ENCOUNTER — Other Ambulatory Visit: Payer: Self-pay | Admitting: Internal Medicine

## 2016-02-15 ENCOUNTER — Other Ambulatory Visit: Payer: Self-pay | Admitting: Internal Medicine

## 2016-03-02 ENCOUNTER — Ambulatory Visit (INDEPENDENT_AMBULATORY_CARE_PROVIDER_SITE_OTHER): Payer: 59 | Admitting: Endocrinology

## 2016-03-02 ENCOUNTER — Encounter: Payer: Self-pay | Admitting: Endocrinology

## 2016-03-02 VITALS — BP 122/84 | HR 75 | Temp 98.6°F | Ht 67.0 in | Wt 141.0 lb

## 2016-03-02 DIAGNOSIS — E042 Nontoxic multinodular goiter: Secondary | ICD-10-CM | POA: Diagnosis not present

## 2016-03-02 DIAGNOSIS — Z5181 Encounter for therapeutic drug level monitoring: Secondary | ICD-10-CM | POA: Insufficient documentation

## 2016-03-02 LAB — TSH: TSH: 0.59 u[IU]/mL (ref 0.35–4.50)

## 2016-03-02 NOTE — Patient Instructions (Addendum)
blood tests are requested for you today.  We'll let you know about the results. Let's recheck the ultrasound.  you will receive a phone call, about a day and time for an appointment. Please return in 1 year. 

## 2016-03-02 NOTE — Progress Notes (Signed)
Subjective:    Patient ID: Sheryl Lester, female    DOB: 22-Sep-1955, 60 y.o.   MRN: [id number]  HPI Pt returns for f/u of multinodular goiter (dx'ed 2006; also in 2006, she had bx of the predominant right nodule: result was FOLLICULAR EPITHELIUM IN A PREDOMINANTLY MICROFOLLICULAR PATTERN; she has been euthyroid, and has never been on prescribed thyroid hormone therapy).  She does not notice the goiter.  She has fatigue.  She takes tegretol for chronic pain, and requests to check level today. Past Medical History  Diagnosis Date  . ALLERGIC RHINITIS 01/20/2008  . ANEMIA-IRON DEFICIENCY 01/20/2008  . ANXIETY 01/20/2008  . ASTHMATIC BRONCHITIS, ACUTE 05/19/2008  . CENTRAL SLEEP APNEA CONDS CLASSIFIED ELSEWHERE 08/31/2010  . DEPRESSION 01/20/2008  . DYSPHAGIA UNSPECIFIED 12/06/2008  . ELEVATED BLOOD PRESSURE WITHOUT DIAGNOSIS OF HYPERTENSION 12/06/2008  . HYPERLIPIDEMIA 01/20/2008  . HYPERTENSION 12/16/2008  . OTITIS MEDIA, ACUTE, BILATERAL 12/16/2008  . OTITIS MEDIA, SEROUS, CHRONIC 12/30/2008  . PVD 05/12/2010  . SINUSITIS- ACUTE-NOS 09/21/2008  . SWELLING MASS OR LUMP IN HEAD AND NECK 09/21/2008  . Unspecified hearing loss 09/21/2008  . VENOUS INSUFFICIENCY, LEGS 05/03/2010  . THYROID NODULE, LEFT     "unable to aspirate; has gotten very small"   . Trigeminal neuralgia 01/20/2008  . Pyelonephritis "hospitalized in ~ 1991"  . Complication of anesthesia 1987    "w/one of the anesthesia agents that they don't use anymore; chest muscles constricted; felt like I couldn't breath"  . OSA treated with BiPAP   . GERD   . Migraine     "related to menses; went away after hysterectomy"  . History of sleep walking   . Bipolar disorder ([place])   . Melanoma ([place]) 2012    "skin of my stomach"    Past Surgical History  Procedure Laterality Date  . Cerebral microvascular decompression  1999    for chronic facial pain/ [facility]  . Oophorectomy Bilateral 2000  . Appendectomy  2000  . Breast biopsy Left 1994   Benign  . Inguinal hernia repair Right 1987  . Abdominal hysterectomy  2000  . Brain surgery    . Melanoma excision  2015    "skin of my stomach"  . Cholecystectomy N/A 12/13/2015    Procedure: LAPAROSCOPIC CHOLECYSTECTOMY;  Surgeon: Mickeal Skinner, MD;  Location: [facility];  Service: General;  Laterality: N/A;    Social History   Social History  . Marital Status: Married    Spouse Name: N/A  . Number of Children: 0  . Years of Education: N/A   Occupational History  . disabled    Social History Main Topics  . Smoking status: Never Smoker   . Smokeless tobacco: Never Used  . Alcohol Use: No  . Drug Use: No  . Sexual Activity: Not Currently   Other Topics Concern  . Not on file   Social History Narrative    Current Outpatient Prescriptions on File Prior to Visit  Medication Sig Dispense Refill  . aspirin 81 MG EC tablet Take 81 mg by mouth every morning.     . Buprenorphine (BUTRANS) 20 MCG/HR PTWK Place 20 mcg onto the skin every Tuesday.     Marland Kitchen BUTRANS 10 MCG/HR PTWK patch Take 10 mcg by mouth daily as needed (for pain).   2  . Cholecalciferol (VITAMIN D) 2000 units tablet Take 2,000 Units by mouth every morning.     . clonazePAM (KLONOPIN) 1 MG tablet 1 tablet in the am, and 2  tablets at night    . desvenlafaxine (PRISTIQ) 100 MG 24 hr tablet Take 100 mg by mouth daily.    Marland Kitchen EQUETRO 300 MG CP12 Take 300-600 mg by mouth 2 (two) times daily. 600 mg in the morning and 300 mg in the evening  11  . metoprolol (LOPRESSOR) 50 MG tablet TAKE 1 TABLET TWICE A DAY 60 tablet 0  . NONFORMULARY OR COMPOUNDED ITEM Take 1 application by mouth 4 (four) times daily as needed. Topically on cheek and inside mouth of mouth to calm trigeminal nerve    . NUCYNTA 100 MG TABS Take 250 mg by mouth 3 (three) times daily.     No current facility-administered medications on file prior to visit.    Allergies  Allergen Reactions  . Aripiprazole Rash and Swelling  . Avapro [Irbesartan] Other  (See Comments)    hyponatremia  . Sulfamethoxazole-Trimethoprim Nausea And Vomiting    Family History  Problem Relation Age of Onset  . Hyperlipidemia Mother   . Hypertension Mother   . Diabetes Father   . Melanoma Father   . Melanoma Sister   . Breast cancer Paternal Aunt   . Heart disease Paternal Grandmother   . Heart disease Other     Mother side of family-several uncles   . Colon cancer Neg Hx     BP 122/84 mmHg  Pulse 75  Temp(Src) 98.6 F (37 C) (Oral)  Ht 5\' 7"  (1.702 m)  Wt 141 lb (63.957 kg)  BMI 22.08 kg/m2  SpO2 95%  Review of Systems She has intermittent headache and decreased appetite.    Objective:   Physical Exam VITAL SIGNS:  See vs page GENERAL: no distress NECK: small multinodular goiter (R>L).    Lab Results  Component Value Date   TSH 0.59 03/02/2016      Assessment & Plan:  Goiter: clinically unchanged.  Euthyroid Fatigue: not thyroid-related.  Patient is advised the following: Patient Instructions  blood tests are requested for you today.  We'll let you know about the results.   Let's recheck the ultrasound.  you will receive a phone call, about a day and time for an appointment.   Please return in 1 year.    Renato Shin, MD

## 2016-03-03 LAB — CARBAMAZEPINE LEVEL, TOTAL: Carbamazepine Lvl: 7 mg/L (ref 4.0–12.0)

## 2016-03-05 ENCOUNTER — Telehealth: Payer: Self-pay | Admitting: Emergency Medicine

## 2016-03-05 NOTE — Telephone Encounter (Signed)
Patient called and stated that she needs to have a blood panel per Dr.Steiner her number is [phone]. She needs her sodium level checked. Can that be put in or does she need to make an appointment. Please follow up thanks.

## 2016-03-05 NOTE — Telephone Encounter (Signed)
If that doctor wants labs they should order the labs.

## 2016-03-07 NOTE — Telephone Encounter (Signed)
Patient aware and will schedule an office visit.  

## 2016-03-21 ENCOUNTER — Encounter: Payer: Self-pay | Admitting: Internal Medicine

## 2016-03-21 ENCOUNTER — Other Ambulatory Visit (INDEPENDENT_AMBULATORY_CARE_PROVIDER_SITE_OTHER): Payer: PRIVATE HEALTH INSURANCE

## 2016-03-21 ENCOUNTER — Ambulatory Visit (INDEPENDENT_AMBULATORY_CARE_PROVIDER_SITE_OTHER): Payer: PRIVATE HEALTH INSURANCE | Admitting: Internal Medicine

## 2016-03-21 VITALS — BP 142/98 | HR 99 | Temp 98.6°F | Resp 18 | Ht 67.0 in | Wt 142.0 lb

## 2016-03-21 DIAGNOSIS — R21 Rash and other nonspecific skin eruption: Secondary | ICD-10-CM

## 2016-03-21 LAB — CBC
HCT: 37.5 % (ref 36.0–46.0)
HEMOGLOBIN: 12.4 g/dL (ref 12.0–15.0)
MCHC: 33.1 g/dL (ref 30.0–36.0)
MCV: 85.2 fl (ref 78.0–100.0)
PLATELETS: 324 10*3/uL (ref 150.0–400.0)
RBC: 4.4 Mil/uL (ref 3.87–5.11)
RDW: 13.4 % (ref 11.5–15.5)
WBC: 5.1 10*3/uL (ref 4.0–10.5)

## 2016-03-21 LAB — COMPREHENSIVE METABOLIC PANEL
ALT: 21 U/L (ref 0–35)
AST: 22 U/L (ref 0–37)
Albumin: 4.3 g/dL (ref 3.5–5.2)
Alkaline Phosphatase: 94 U/L (ref 39–117)
BUN: 10 mg/dL (ref 6–23)
CHLORIDE: 99 meq/L (ref 96–112)
CO2: 32 meq/L (ref 19–32)
Calcium: 9.6 mg/dL (ref 8.4–10.5)
Creatinine, Ser: 0.74 mg/dL (ref 0.40–1.20)
GFR: 85.62 mL/min (ref >60.00)
GLUCOSE: 92 mg/dL (ref 70–99)
POTASSIUM: 4.8 meq/L (ref 3.5–5.1)
SODIUM: 136 meq/L (ref 135–145)
TOTAL PROTEIN: 7.5 g/dL (ref 6.0–8.3)
Total Bilirubin: 0.3 mg/dL (ref 0.2–1.2)

## 2016-03-21 LAB — CARBAMAZEPINE LEVEL, TOTAL: Carbamazepine Lvl: 3.7 mg/L — ABNORMAL LOW (ref 4.0–12.0)

## 2016-03-21 MED ORDER — HYDROXYZINE HCL 25 MG PO TABS: 25.0000 mg | ORAL_TABLET | Freq: Three times a day (TID) | ORAL | 0 refills | Status: DC | PRN

## 2016-03-21 MED ORDER — TRIAMCINOLONE ACETONIDE 0.1 % EX CREA: 1.0000 "application " | TOPICAL_CREAM | Freq: Two times a day (BID) | CUTANEOUS | 0 refills | Status: DC

## 2016-03-21 NOTE — Progress Notes (Signed)
   Subjective:    Patient ID: Sheryl Lester, female    DOB: May 31, 1956, 60 y.o.   MRN: [id number]  HPI The patient is a 60 YO female coming in for rash for 9 days. She started taking bupropion around the time the rash started. She is itching over her whole body including her scalp. She has been scratching quite a bit over her legs and has several open sores from scratching. She has not tried anything for it. She was hoping it would go away but instead it worsened. Denies rash on her face or mouth. No other medicine changes or soap or detergent changes.   Review of Systems  Constitutional: Negative for activity change, appetite change, fatigue, fever and unexpected weight change.  Respiratory: Negative for cough, chest tightness, shortness of breath and wheezing.   Cardiovascular: Negative for chest pain, palpitations and leg swelling.  Gastrointestinal: Negative for abdominal distention, constipation, diarrhea and nausea.  Musculoskeletal: Positive for arthralgias.  Skin: Positive for color change, rash and wound.  Neurological:       Trigeminal neuralgia.       Objective:   Physical Exam  Constitutional: She is oriented to person, place, and time. She appears well-developed and well-nourished.  HENT:  Head: Normocephalic and atraumatic.  Eyes: EOM are normal.  Neck: Normal range of motion.  Cardiovascular: Normal rate and regular rhythm.   Pulmonary/Chest: Effort normal and breath sounds normal. No respiratory distress. She has no wheezes. She has no rales.  Abdominal: Soft. Bowel sounds are normal. She exhibits no distension. There is no tenderness. There is no rebound.  Musculoskeletal: She exhibits no edema.  Neurological: She is alert and oriented to person, place, and time. Coordination normal.  Skin: Skin is warm and dry.  Rash on several areas of her arms and legs, on the legs some significant skin breakdown around the knee with stigmata of scratching.    Vitals:   03/21/16 0958  BP: (!) 142/98  Pulse: 99  Resp: 18  Temp: 98.6 F (37 C)  TempSrc: Oral  SpO2: 98%  Weight: 142 lb (64.4 kg)  Height: 5\' 7"  (1.702 m)      Assessment & Plan:

## 2016-03-21 NOTE — Progress Notes (Signed)
Pre visit review using our clinic review tool, if applicable. No additional management support is needed unless otherwise documented below in the visit note. 

## 2016-03-21 NOTE — Assessment & Plan Note (Addendum)
Checking CMP, CBC, tegretol level. Advised that she stop the bupropion and call her mental health provider to let them know and to get alternative medication recommendation. Rx for hydroxyzine for itching and triamcinolone for the areas of rash.

## 2016-03-21 NOTE — Patient Instructions (Signed)
We have sent in hydroxyzine which is an anti-itching medicine like benadryl. You can take it up to 3 times per day as needed. It can make you sleepy just like benadryl can.   We have also sent in triamcinolone cream to use on the areas which are itching or rash to help them heal faster.   We are checking the labs and would recommend to stop the bupropion medicine as it is the likely cause of the rash. You can call that doctor to see if they can get a replacement for it.    Drug Rash A drug rash is a change in the color or texture of the skin that is caused by a drug. It can develop minutes, hours, or days after the person takes the drug. CAUSES This condition is usually caused by a drug allergy. It can also be caused by exposure to sunlight after taking a drug that makes the skin sensitive to light. Drugs that commonly cause rashes include:  Penicillin.  Antibiotic medicines.  Medicines that treat seizures.  Medicines that treat cancer (chemotherapy).  Aspirin and other nonsteroidal anti-inflammatory drugs (NSAIDs).  Injectable dyes that contain iodine.  Insulin. SYMPTOMS Symptoms of this condition include:  Redness.  Tiny bumps.  Peeling.  Itching.  Itchy welts (hives).  Swelling. The rash may appear on a small area of skin or all over the body. DIAGNOSIS To diagnose the condition, your health care provider will do a physical exam. He or she may also order tests to find out which drug caused the rash. Tests to find the cause of a rash include:  Skin tests.  Blood tests.  Drug challenge. For this test, you stop taking all of the drugs that you do not need to take, and then you start taking them again by adding back one of the drugs at a time. TREATMENT A drug rash may be treated with medicines, including:  Antihistamines. These may be given to relieve itching.  An NSAID. This may be given to reduce swelling and treat pain.  A steroid drug. This may be given to  reduce swelling. The rash usually goes away when the person stops taking the drug that caused it. HOME CARE INSTRUCTIONS  Take medicines only as directed by your health care provider.  Let all of your health care providers know about any drug reactions you have had in the past.  If you have hives, take a cool shower or use a cool compress to relieve itchiness. SEEK MEDICAL CARE IF:  You have a fever.  Your rash is not going away.  Your rash gets worse.  Your rash comes back.  You have wheezing or coughing. SEEK IMMEDIATE MEDICAL CARE IF:  You start to have breathing problems.  You start to have shortness of breath.  You face or throat starts to swell.  You have severe weakness with dizziness or fainting.  You have chest pain.   This information is not intended to replace advice given to you by your health care provider. Make sure you discuss any questions you have with your health care provider.   Document Released: 09/20/2004 Document Revised: 09/03/2014 Document Reviewed: 06/09/2014 Elsevier Interactive Patient Education [employer].

## 2016-04-03 ENCOUNTER — Other Ambulatory Visit: Payer: Self-pay | Admitting: *Deleted

## 2016-04-03 MED ORDER — METOPROLOL TARTRATE 50 MG PO TABS
50.0000 mg | ORAL_TABLET | Freq: Two times a day (BID) | ORAL | 1 refills | Status: DC
Start: 2016-04-03 — End: 2016-10-13

## 2016-04-04 ENCOUNTER — Other Ambulatory Visit: Payer: Self-pay | Admitting: Endocrinology

## 2016-04-06 ENCOUNTER — Other Ambulatory Visit: Payer: Self-pay | Admitting: Internal Medicine

## 2016-05-25 ENCOUNTER — Ambulatory Visit (HOSPITAL_COMMUNITY)
Admission: RE | Admit: 2016-05-25 | Discharge: 2016-05-25 | Disposition: A | Discharge status: Home / Self Care | Payer: 59 | Attending: Psychiatry | Admitting: Psychiatry

## 2016-05-25 DIAGNOSIS — F314 Bipolar disorder, current episode depressed, severe, without psychotic features: Secondary | ICD-10-CM | POA: Insufficient documentation

## 2016-05-25 DIAGNOSIS — R45851 Suicidal ideations: Secondary | ICD-10-CM | POA: Insufficient documentation

## 2016-05-25 NOTE — H&P (Signed)
Behavioral Health Medical Screening Exam  Sheryl Lester is a 60 y.o. female who presents as a walk-in patient. Patient reports suicidal ideations with a plan to overdose. Reports that she does not use her Bi-Pap in the hopes that it will lead to her death. Reports that she is not compliant with medications. Denies homicidal ideations or AV hallucinations.  Total Time spent with patient: 30 minutes  Psychiatric Specialty Exam: Physical Exam  Constitutional: She is oriented to person, place, and time. She appears well-developed.  HENT:  Head: Normocephalic and atraumatic.  Cardiovascular: Normal rate, regular rhythm and normal heart sounds.   Respiratory: Effort normal and breath sounds normal. No respiratory distress. She has no wheezes. She has no rales. She exhibits no tenderness.  Neurological: She is alert and oriented to person, place, and time.  Skin: She is not diaphoretic.    Review of Systems  Musculoskeletal: Positive for falls.  Neurological: Positive for dizziness.  Psychiatric/Behavioral: Positive for depression and suicidal ideas. Negative for hallucinations, memory loss and substance abuse. The patient is nervous/anxious and has insomnia.   All other systems reviewed and are negative.   Blood pressure (!) 160/98, pulse 98, temperature 98.6 F (37 C), temperature source Oral, resp. rate 18, SpO2 98 %.There is no height or weight on file to calculate BMI.  General Appearance: Disheveled  Eye Contact:  Good  Speech:  Normal Rate  Volume:  Normal  Mood:  Anxious and Depressed  Affect:  Depressed  Thought Process:  Disorganized  Orientation:  Full (Time, Place, and Person)  Thought Content:  Logical  Suicidal Thoughts:  Yes.  with intent/plan  Homicidal Thoughts:  No  Memory:  Immediate;   Good Recent;   Good  Judgement:  Good  Insight:  Good  Psychomotor Activity:  Normal  Concentration: Concentration: Fair  Recall:  Good  Fund of Knowledge:Good  Language:  Good  Akathisia:  Negative  Handed:  Right  AIMS (if indicated):     Assets:  Communication Skills Desire for Improvement Financial Resources/Insurance Housing  Sleep:       Musculoskeletal: Strength & Muscle Tone: within normal limits Gait & Station: normal Patient leans: N/A  Blood pressure (!) 160/98, pulse 98, temperature 98.6 F (37 C), temperature source Oral, resp. rate 18, SpO2 98 %.  Recommendations:  Based on my evaluation the patient appears to have an emergency medical condition for which I recommend the patient be transferred to the emergency department for further evaluation. Patient reports dizziness at times and frequent falls.   Rozetta Nunnery, NP 05/25/2016, 10:44 PM
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
## 2016-05-26 ENCOUNTER — Emergency Department (HOSPITAL_COMMUNITY)
Admission: EM | Admit: 2016-05-26 | Discharge: 2016-05-27 | Disposition: A | Discharge status: Other Acute Inpatient Hospital | Payer: 59 | Attending: Emergency Medicine | Admitting: Emergency Medicine

## 2016-05-26 ENCOUNTER — Encounter (HOSPITAL_COMMUNITY): Payer: Self-pay | Admitting: Oncology

## 2016-05-26 DIAGNOSIS — I10 Essential (primary) hypertension: Secondary | ICD-10-CM | POA: Diagnosis not present

## 2016-05-26 DIAGNOSIS — Z7982 Long term (current) use of aspirin: Secondary | ICD-10-CM | POA: Insufficient documentation

## 2016-05-26 DIAGNOSIS — Z8249 Family history of ischemic heart disease and other diseases of the circulatory system: Secondary | ICD-10-CM

## 2016-05-26 DIAGNOSIS — R45851 Suicidal ideations: Secondary | ICD-10-CM | POA: Diagnosis not present

## 2016-05-26 DIAGNOSIS — Z01818 Encounter for other preprocedural examination: Secondary | ICD-10-CM | POA: Diagnosis present

## 2016-05-26 DIAGNOSIS — F332 Major depressive disorder, recurrent severe without psychotic features: Secondary | ICD-10-CM | POA: Diagnosis present

## 2016-05-26 DIAGNOSIS — Z803 Family history of malignant neoplasm of breast: Secondary | ICD-10-CM | POA: Diagnosis not present

## 2016-05-26 DIAGNOSIS — Z833 Family history of diabetes mellitus: Secondary | ICD-10-CM

## 2016-05-26 DIAGNOSIS — Z79899 Other long term (current) drug therapy: Secondary | ICD-10-CM | POA: Diagnosis not present

## 2016-05-26 DIAGNOSIS — Z791 Long term (current) use of non-steroidal anti-inflammatories (NSAID): Secondary | ICD-10-CM | POA: Diagnosis not present

## 2016-05-26 DIAGNOSIS — F315 Bipolar disorder, current episode depressed, severe, with psychotic features: Secondary | ICD-10-CM | POA: Diagnosis not present

## 2016-05-26 DIAGNOSIS — Z840 Family history of diseases of the skin and subcutaneous tissue: Secondary | ICD-10-CM | POA: Diagnosis not present

## 2016-05-26 DIAGNOSIS — F314 Bipolar disorder, current episode depressed, severe, without psychotic features: Secondary | ICD-10-CM

## 2016-05-26 LAB — RAPID URINE DRUG SCREEN, HOSP PERFORMED
AMPHETAMINES: NOT DETECTED
Barbiturates: NOT DETECTED
Benzodiazepines: POSITIVE — AB
Cocaine: NOT DETECTED
OPIATES: NOT DETECTED
TETRAHYDROCANNABINOL: NOT DETECTED

## 2016-05-26 LAB — CBC
HCT: 41.6 % (ref 36.0–46.0)
HEMOGLOBIN: 13.9 g/dL (ref 12.0–15.0)
MCH: 28.7 pg (ref 26.0–34.0)
MCHC: 33.4 g/dL (ref 30.0–36.0)
MCV: 85.8 fL (ref 78.0–100.0)
Platelets: 322 10*3/uL (ref 150–400)
RBC: 4.85 MIL/uL (ref 3.87–5.11)
RDW: 13 % (ref 11.5–15.5)
WBC: 7.7 10*3/uL (ref 4.0–10.5)

## 2016-05-26 LAB — COMPREHENSIVE METABOLIC PANEL
ALT: 34 U/L (ref 14–54)
AST: 40 U/L (ref 15–41)
Albumin: 5.4 g/dL — ABNORMAL HIGH (ref 3.5–5.0)
Alkaline Phosphatase: 101 U/L (ref 38–126)
Anion gap: 9 (ref 5–15)
BUN: 19 mg/dL (ref 6–20)
CHLORIDE: 103 mmol/L (ref 101–111)
CO2: 25 mmol/L (ref 22–32)
CREATININE: 0.69 mg/dL (ref 0.44–1.00)
Calcium: 9.5 mg/dL (ref 8.9–10.3)
GFR calc non Af Amer: >60 mL/min (ref >60)
Glucose, Bld: 106 mg/dL — ABNORMAL HIGH (ref 65–99)
Potassium: 3.8 mmol/L (ref 3.5–5.1)
SODIUM: 137 mmol/L (ref 135–145)
Total Bilirubin: 0.9 mg/dL (ref 0.3–1.2)
Total Protein: 9 g/dL — ABNORMAL HIGH (ref 6.5–8.1)

## 2016-05-26 LAB — SALICYLATE LEVEL

## 2016-05-26 LAB — ETHANOL: Alcohol, Ethyl (B): <5 mg/dL (ref <5)

## 2016-05-26 LAB — ACETAMINOPHEN LEVEL: Acetaminophen (Tylenol), Serum: <10 ug/mL — ABNORMAL LOW (ref 10–30)

## 2016-05-26 MED ORDER — VENLAFAXINE HCL ER 150 MG PO CP24
150.0000 mg | ORAL_CAPSULE | Freq: Every day | ORAL | Status: DC
  Administered 2016-05-27: 150 mg via ORAL
  Filled 2016-05-26 (×2): qty 1

## 2016-05-26 MED ORDER — QUETIAPINE FUMARATE ER 50 MG PO TB24
50.0000 mg | ORAL_TABLET | Freq: Every day | ORAL | Status: DC
  Administered 2016-05-26: 50 mg via ORAL
  Filled 2016-05-26: qty 1

## 2016-05-26 MED ORDER — CARBAMAZEPINE ER 200 MG PO CP12
600.0000 mg | ORAL_CAPSULE | Freq: Every day | ORAL | Status: DC
  Filled 2016-05-26: qty 3

## 2016-05-26 MED ORDER — CARBAMAZEPINE ER 200 MG PO CP12
600.0000 mg | ORAL_CAPSULE | Freq: Two times a day (BID) | ORAL | Status: DC
  Administered 2016-05-26 – 2016-05-27 (×2): 600 mg via ORAL
  Filled 2016-05-26 (×3): qty 3

## 2016-05-26 MED ORDER — LOPERAMIDE HCL 2 MG PO CAPS
4.0000 mg | ORAL_CAPSULE | ORAL | Status: DC | PRN
  Administered 2016-05-26: 4 mg via ORAL
  Filled 2016-05-26: qty 2

## 2016-05-26 MED ORDER — CLONAZEPAM 1 MG PO TABS
1.0000 mg | ORAL_TABLET | Freq: Two times a day (BID) | ORAL | Status: DC
  Administered 2016-05-26: 1 mg via ORAL
  Filled 2016-05-26: qty 1

## 2016-05-26 MED ORDER — ACETAMINOPHEN 325 MG PO TABS
650.0000 mg | ORAL_TABLET | ORAL | Status: DC | PRN
  Administered 2016-05-26 – 2016-05-27 (×4): 650 mg via ORAL
  Filled 2016-05-26 (×4): qty 2

## 2016-05-26 MED ORDER — ASPIRIN EC 81 MG PO TBEC
81.0000 mg | DELAYED_RELEASE_TABLET | Freq: Every day | ORAL | Status: DC
  Administered 2016-05-26 – 2016-05-27 (×2): 81 mg via ORAL
  Filled 2016-05-26 (×2): qty 1

## 2016-05-26 MED ORDER — CARBAMAZEPINE ER 100 MG PO CP12
300.0000 mg | ORAL_CAPSULE | Freq: Every day | ORAL | Status: DC
  Administered 2016-05-26: 600 mg via ORAL

## 2016-05-26 MED ORDER — IBUPROFEN 200 MG PO TABS: 600.0000 mg | ORAL_TABLET | Freq: Three times a day (TID) | ORAL | Status: DC | PRN

## 2016-05-26 MED ORDER — METOPROLOL TARTRATE 25 MG PO TABS
100.0000 mg | ORAL_TABLET | Freq: Two times a day (BID) | ORAL | Status: DC
  Administered 2016-05-26 – 2016-05-27 (×3): 100 mg via ORAL
  Filled 2016-05-26 (×3): qty 4

## 2016-05-26 MED ORDER — TRAZODONE HCL 100 MG PO TABS
100.0000 mg | ORAL_TABLET | Freq: Every day | ORAL | Status: DC
  Administered 2016-05-26: 100 mg via ORAL
  Filled 2016-05-26: qty 1

## 2016-05-26 NOTE — ED Notes (Signed)
Patient states she is allergic to ibuprofen.

## 2016-05-26 NOTE — ED Notes (Signed)
Psychiatrist at bedside talking with patient.

## 2016-05-26 NOTE — BH Assessment (Signed)
Received call Sheryl Lester from [facility] stating their physician, Dr. Macky Lower, accepted Pt. Pt refuses to be transferred to [facility]. Notified [facility] of Pt's refusal.   Orpah Greek Anson Fret, [facility], [facility], [facility] Triage Specialist [phone]

## 2016-05-26 NOTE — ED Notes (Signed)
Patient ate yogurt and pudding on soft tray.

## 2016-05-26 NOTE — ED Notes (Signed)
Nurse practitioner, Theodoro Clock, notified and she ordered 50mg  of Seroquel to be given at bedtime.

## 2016-05-26 NOTE — ED Notes (Signed)
Patient requesting when psychiatrist will be around. Patient told psychiatrist will make rounds starting about 9 am.

## 2016-05-26 NOTE — BH Assessment (Signed)
Faxed clinical information to the following facilities for placement:  [facility] [address] [place], [facility], [facility] Triage Specialist [phone]

## 2016-05-26 NOTE — ED Notes (Signed)
Patient refused medication saying, "why would they order a medication for me when I haven't seen the doctor yet?"

## 2016-05-26 NOTE — ED Provider Notes (Signed)
[facility] DEPT Provider Note   CSN: [id number] Arrival date & time: 05/26/16  0255  By signing my name below, I, Maud Deed. Royston Sinner, attest that this documentation has been prepared under the direction and in the presence of Davonna Belling, MD.  Electronically Signed: Maud Deed. Royston Sinner, ED Scribe. 05/26/16. 4:46 AM.    History   Chief Complaint Chief Complaint  Patient presents with  . Medical Clearance   The history is provided by the patient. No language interpreter was used.   HPI Comments: Sheryl Lester is a 60 y.o. female with a PMHx of anxiety, depression, and bipolar disorder who presents to the Emergency Department here for medical clearance this evening. Pt states depression has worsened over the course of several days due to family stressors. She states she has been unable to function as normal without any desire to return to baseline secondary to her depression. She admits she is non complaint with her medications and has not used her BiPAP machine in approximately 2 weeks. She stated "I do not mind if not using my BiPAP machine leads to my death". She is followed closely by [facility], psychiatry, and a psychologist. Pt states she contacted both her psychiatrist and psychologist early this morning for possible medication changes. Both declined and felt inpatient treatment was best for her at this time. Pt is requesting transport to [facility] for further psychiatric treatment.  Past Medical History:  Diagnosis Date  . ALLERGIC RHINITIS 01/20/2008  . ANEMIA-IRON DEFICIENCY 01/20/2008  . ANXIETY 01/20/2008  . ASTHMATIC BRONCHITIS, ACUTE 05/19/2008  . Bipolar disorder ([place])   . CENTRAL SLEEP APNEA CONDS CLASSIFIED ELSEWHERE 08/31/2010  . Complication of anesthesia 1987   "w/one of the anesthesia agents that they don't use anymore; chest muscles constricted; felt like I couldn't breath"  . DEPRESSION 01/20/2008  . DYSPHAGIA UNSPECIFIED 12/06/2008  . ELEVATED BLOOD  PRESSURE WITHOUT DIAGNOSIS OF HYPERTENSION 12/06/2008  . GERD   . History of sleep walking   . HYPERLIPIDEMIA 01/20/2008  . HYPERTENSION 12/16/2008  . Melanoma ([place]) 2012   "skin of my stomach"  . Migraine    "related to menses; went away after hysterectomy"  . OSA treated with BiPAP   . OTITIS MEDIA, ACUTE, BILATERAL 12/16/2008  . OTITIS MEDIA, SEROUS, CHRONIC 12/30/2008  . PVD 05/12/2010  . Pyelonephritis "hospitalized in ~ 1991"  . SINUSITIS- ACUTE-NOS 09/21/2008  . SWELLING MASS OR LUMP IN HEAD AND NECK 09/21/2008  . THYROID NODULE, LEFT    "unable to aspirate; has gotten very small"   . Trigeminal neuralgia 01/20/2008  . Unspecified hearing loss 09/21/2008  . VENOUS INSUFFICIENCY, LEGS 05/03/2010    Patient Active Problem List   Diagnosis Date Noted  . Rash and nonspecific skin eruption 03/21/2016  . Encounter for therapeutic drug monitoring 03/02/2016  . Cholecystitis with cholelithiasis 12/12/2015  . 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 Osteopenia 12/23/2012  . Skin lesion 09/23/2012  . Hearing loss 09/23/2012  . Melanoma ([place]) 07/05/2011  . Preventative health care 03/01/2011  . PVD 05/12/2010  . 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 HYPERTENSION 12/16/2008  . TIA 12/16/2008  . GERD 12/06/2008  . Unspecified hearing loss 09/21/2008  . HYPERLIPIDEMIA 01/20/2008  . ANEMIA-IRON DEFICIENCY 01/20/2008  . ANXIETY 01/20/2008  . DEPRESSION 01/20/2008  . Trigeminal neuralgia 01/20/2008  . ALLERGIC RHINITIS 01/20/2008    Past Surgical History:  Procedure Laterality Date  . ABDOMINAL HYSTERECTOMY  2000  . APPENDECTOMY  2000  . BRAIN SURGERY    . BREAST BIOPSY Left 1994   Benign  . 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 CHOLECYSTECTOMY N/A 12/13/2015   Procedure: LAPAROSCOPIC CHOLECYSTECTOMY;  Surgeon: Mickeal Skinner, MD;  Location: [facility];  Service: General;  Laterality: N/A;  .  INGUINAL HERNIA REPAIR Right 1987  . MELANOMA EXCISION  2015   "skin of my stomach"  . OOPHORECTOMY Bilateral 2000    OB History    No data available       Home Medications    Prior to Admission medications   Medication Sig Start Date End Date Taking? Authorizing Provider  aspirin 81 MG EC tablet Take 81 mg by mouth every morning.    Yes Historical Provider, MD  Buprenorphine (BUTRANS) 20 MCG/HR PTWK Place 20 mcg onto the skin every Tuesday.    Yes Historical Provider, MD  BUTRANS 10 MCG/HR PTWK patch Take 10 mcg by mouth daily as needed (for pain).  11/30/15  Yes Historical Provider, MD  clonazePAM (KLONOPIN) 1 MG tablet 1 tablet in the am, and 2 tablets at night 03/07/14  Yes Historical Provider, MD  desvenlafaxine (PRISTIQ) 100 MG 24 hr tablet Take 100 mg by mouth daily. 11/08/15  Yes Historical Provider, MD  diphenhydramine-acetaminophen (TYLENOL PM) 25-500 MG TABS tablet Take 1 tablet by mouth at bedtime as needed (pain).   Yes Historical Provider, MD  EQUETRO 300 MG CP12 Take 300-600 mg by mouth 2 (two) times daily. 600 mg in the morning and 300 mg in the evening 11/30/15  Yes Historical Provider, MD  metoprolol (LOPRESSOR) 100 MG tablet Take 100 mg by mouth 2 (two) times daily.   Yes Historical Provider, MD  NON FORMULARY at bedtime. BI-Pap   Yes Historical Provider, MD  NONFORMULARY OR COMPOUNDED ITEM Take 1 application by mouth 4 (four) times daily as needed. Topically on cheek and inside mouth of mouth to calm trigeminal nerve 10/31/15  Yes Historical Provider, MD  NUCYNTA 100 MG TABS Take 250 mg by mouth 3 (three) times daily. 12/09/15  Yes Historical Provider, MD  hydrOXYzine (ATARAX/VISTARIL) 25 MG tablet Take 1 tablet (25 mg total) by mouth 3 (three) times daily as needed. Patient not taking: Reported on 05/26/2016 03/21/16   Hoyt Koch, MD  metoprolol (LOPRESSOR) 50 MG tablet Take 1 tablet (50 mg total) by mouth 2 (two) times daily. Patient not taking: Reported on  05/26/2016 04/03/16   Hoyt Koch, MD  triamcinolone cream (KENALOG) 0.1 % Apply 1 application topically 2 (two) times daily. Patient not taking: Reported on 05/26/2016 03/21/16   Hoyt Koch, MD    Family History Family History  Problem Relation Age of Onset  . Hyperlipidemia Mother   . Hypertension Mother   . Diabetes Father   . Melanoma Father   . Melanoma Sister   . Heart disease Paternal Grandmother   . Breast cancer Paternal Aunt   . Heart disease Other     Mother side of family-several uncles   . Colon cancer Neg Hx     Social History Social History  Substance Use Topics  . Smoking status: Never Smoker  . Smokeless tobacco: Never Used  . Alcohol use No     Allergies   Aripiprazole; Fentanyl; Avapro [irbesartan]; Sulfamethoxazole-trimethoprim; and Bupropion   Review of Systems Review of Systems  Constitutional: Negative for chills and fever.  Respiratory: Negative for cough.   Gastrointestinal: Negative for nausea  and vomiting.  Psychiatric/Behavioral: Positive for dysphoric mood.  All other systems reviewed and are negative.    Physical Exam Updated Vital Signs BP 150/95 (BP Location: Left Arm)   Pulse 103   Temp 98 F (36.7 C) (Oral)   Resp 16   Ht 5\' 7"  (1.702 m)   Wt 150 lb (68 kg)   SpO2 100%   BMI 23.49 kg/m   Physical Exam  Constitutional: She is oriented to person, place, and time. She appears well-developed and well-nourished.  HENT:  Head: Normocephalic.  Eyes: EOM are normal.  Neck: Normal range of motion.  Cardiovascular: Normal rate, regular rhythm and normal heart sounds.   No murmur heard. Pulmonary/Chest: Effort normal.  Abdominal: Soft. Bowel sounds are normal. She exhibits no distension.  Musculoskeletal: Normal range of motion.  Neurological: She is alert and oriented to person, place, and time.  Psychiatric: She has a normal mood and affect.  Nursing note and vitals reviewed.    ED Treatments / Results    DIAGNOSTIC STUDIES: Oxygen Saturation is 100% on RA, Normal by my interpretation.    COORDINATION OF CARE: 4:46 AM-Discussed treatment plan with pt at bedside and pt agreed to plan.     Labs (all labs ordered are listed, but only abnormal results are displayed) Labs Reviewed  COMPREHENSIVE METABOLIC PANEL - Abnormal; Notable for the following:       Result Value   Glucose, Bld 106 (*)    Total Protein 9.0 (*)    Albumin 5.4 (*)    All other components within normal limits  ACETAMINOPHEN LEVEL - Abnormal; Notable for the following:    Acetaminophen (Tylenol), Serum <10 (*)    All other components within normal limits  URINE RAPID DRUG SCREEN, HOSP PERFORMED - Abnormal; Notable for the following:    Benzodiazepines POSITIVE (*)    All other components within normal limits  ETHANOL  SALICYLATE LEVEL  CBC    EKG  EKG Interpretation None       Radiology No results found.  Procedures Procedures (including critical care time)  Medications Ordered in ED Medications - No data to display   Initial Impression / Assessment and Plan / ED Course  I have reviewed the triage vital signs and the nursing notes.  Pertinent labs & imaging results that were available during my care of the patient were reviewed by me and considered in my medical decision making (see chart for details).  Clinical Course    Patient with depression. Some suicidal thoughts. States been taking some extra of her medications. Seen by behavioral health center for medical clearance. No beds available there now and patient states she does not really want to go over there. Medically cleared now and will attempt to find placement.  Final Clinical Impressions(s) / ED Diagnoses   Final diagnoses:  Bipolar disorder, current episode depressed, severe, without psychotic features ([place])    New Prescriptions New Prescriptions   No medications on file  I personally performed the services described in this  documentation, which was scribed in my presence. The recorded information has been reviewed and is accurate.      Davonna Belling, MD 05/26/16 [phone]

## 2016-05-26 NOTE — ED Notes (Signed)
Asked patient if she could take a nap now.  Patient replied, " I don't think I can because I am worried about where I am going."  Reassured patient that psyche team would find a nice place for her."

## 2016-05-26 NOTE — ED Notes (Signed)
Patient seems slightly paranoid.  She was accusing the morning sitter of being in her room throwing gloves away in her trash can.  Sitter replied she had not been in patient's room.  Patient argumentative, but not loud.

## 2016-05-26 NOTE — ED Triage Notes (Signed)
Pt presents from [facility] for medical clearance.  Denies SI.  States she is bi-polar and has been non compliant with her medication x 2 weeks.  Pt also has not been taking HTN medications or using her Bipap.

## 2016-05-26 NOTE — ED Notes (Signed)
Patient called nurse into room and said that she has not been able to sleep since coming into ED and also she is afraid that she is going to start with withdrawal symptoms since she has not received any pain medication.  She is also concerned about whether her time in TCU will be counted as emergency department time and wanted nurse to find out for her.  Nurse told her Trazadone had been ordered for her earlier and that would help her sleep.  Patient responded by saying trazodone was not strong enough and she needed either Seroquel or Ambien.  She preferred seroquel,"because that really knocks me out."  "Ambien causes me to sleepwalk and do crazy things."

## 2016-05-26 NOTE — ED Notes (Signed)
Called respiratory to bedside to setup bipap machine for patient.

## 2016-05-26 NOTE — Consult Note (Signed)
Sheryl Lester   Reason for Lester:  Major Depressive disorder Recurrent, Suicidal Ideations Referring Physician:  EDP Patient Identification: Sheryl Lester MRN:  [id number] Principal Diagnosis: Major depressive disorder, recurrent severe without psychotic features [facility]) Diagnosis:   Patient Active Problem List   Diagnosis Date Noted  . Major depressive disorder, recurrent severe without psychotic features ([place]) [F33.2] 05/26/2016    Priority: High  . Rash and nonspecific skin eruption [R21] 03/21/2016  . Encounter for therapeutic drug monitoring [Z51.81] 03/02/2016  . Cholecystitis with cholelithiasis [K80.10] 12/12/2015  . Multinodular goiter [E04.2] 06/14/2015  . 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 Osteopenia [M85.80] 12/23/2012  . Skin lesion [L98.9] 09/23/2012  . Hearing loss [H91.90] 09/23/2012  . Melanoma ([place]) [C43.9] 07/05/2011  . Preventative health care [Z00.00] 03/01/2011  . PVD [I73.9] 05/12/2010  . 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 HYPERTENSION [I10] 12/16/2008  . TIA [G45.9] 12/16/2008  . GERD [K21.9] 12/06/2008  . Unspecified hearing loss [H91.90] 09/21/2008  . HYPERLIPIDEMIA [E78.5] 01/20/2008  . ANEMIA-IRON DEFICIENCY [D50.9] 01/20/2008  . ANXIETY [F41.1] 01/20/2008  . DEPRESSION [F32.9] 01/20/2008  . Trigeminal neuralgia [G50.0] 01/20/2008  . ALLERGIC RHINITIS [J30.9] 01/20/2008    Total Time spent with patient: 45 minutes  Subjective:   Sheryl Lester is a 60 y.o. female patient who states "I am in a crisis and need help. I have a lot of pain and I took too many of my medications just trying to get some sleep last night. I fell out of bed in the morning from being unsteady".  HPI:  Presents with depression which has worsened over the past few days. States she does not have any support system at this time and she and her husband are not "doing so well". Pt states she has not been sleeping well  and has taken larger doses of her Klonopin in order to rest. Pt minimizes this and states it was not an attempt to harm herself. Pt is requesting to go to an inpatient unit to receive therapy at this time. Pt declines to go to [facility] because "I was there ten years ago and they made me go to groups; I don't want to have to constantly come out of my room. I want to rest". Pt also complains of chronic pain due to Trigeminal Neuralgia and is a client at a pain center in [place], no signs of pain. Pt states she wants to go to [facility] or [place], but declines any other place. Pt attempting to dictate care with provider and limit provider choices. Pt states she is having suicidal ideations, but denies plans to harm herself at this time. Pt denies Homicidal ideations or any hallucinations currently. Pt does have an outpatient therapist and psychiatrist for care. Pt does not work and lives at home with her husband who is legally blind and for whom she is responsible for. Pt has her own transportation and is able to drive herself to appointments.  Past Psychiatric History: Bipolar, Depression  Risk to Self: Is patient at risk for suicide?: No, but patient needs Medical Clearance Risk to Others:  none Prior Inpatient Therapy:  yes Prior Outpatient Therapy:  [facility] Allegra  Past Medical History:  Past Medical History:  Diagnosis Date  . ALLERGIC RHINITIS 01/20/2008  . ANEMIA-IRON DEFICIENCY 01/20/2008  . ANXIETY 01/20/2008  . ASTHMATIC BRONCHITIS, ACUTE 05/19/2008  . Bipolar disorder ([place])   . CENTRAL SLEEP APNEA CONDS CLASSIFIED ELSEWHERE 08/31/2010  . Complication of anesthesia  1987   "w/one of the anesthesia agents that they don't use anymore; chest muscles constricted; felt like I couldn't breath"  . DEPRESSION 01/20/2008  . DYSPHAGIA UNSPECIFIED 12/06/2008  . ELEVATED BLOOD PRESSURE WITHOUT DIAGNOSIS OF HYPERTENSION 12/06/2008  . GERD   . History of sleep walking   . HYPERLIPIDEMIA 01/20/2008   . HYPERTENSION 12/16/2008  . Melanoma ([place]) 2012   "skin of my stomach"  . Migraine    "related to menses; went away after hysterectomy"  . OSA treated with BiPAP   . OTITIS MEDIA, ACUTE, BILATERAL 12/16/2008  . OTITIS MEDIA, SEROUS, CHRONIC 12/30/2008  . PVD 05/12/2010  . Pyelonephritis "hospitalized in ~ 1991"  . SINUSITIS- ACUTE-NOS 09/21/2008  . SWELLING MASS OR LUMP IN HEAD AND NECK 09/21/2008  . THYROID NODULE, LEFT    "unable to aspirate; has gotten very small"   . Trigeminal neuralgia 01/20/2008  . Unspecified hearing loss 09/21/2008  . VENOUS INSUFFICIENCY, LEGS 05/03/2010    Past Surgical History:  Procedure Laterality Date  . ABDOMINAL HYSTERECTOMY  2000  . APPENDECTOMY  2000  . BRAIN SURGERY    . BREAST BIOPSY Left 1994   Benign  . 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 CHOLECYSTECTOMY N/A 12/13/2015   Procedure: LAPAROSCOPIC CHOLECYSTECTOMY;  Surgeon: Mickeal Skinner, MD;  Location: [place];  Service: General;  Laterality: N/A;  . INGUINAL HERNIA REPAIR Right 1987  . MELANOMA EXCISION  2015   "skin of my stomach"  . OOPHORECTOMY Bilateral 2000   Family History:  Family History  Problem Relation Age of Onset  . Hyperlipidemia Mother   . Hypertension Mother   . Diabetes Father   . Melanoma Father   . Melanoma Sister   . Heart disease Paternal Grandmother   . Breast cancer Paternal Aunt   . Heart disease Other     Mother side of family-several uncles   . Colon cancer Neg Hx    Family Psychiatric  History: none Social History:  History  Alcohol Use No     History  Drug Use No    Social History   Social History  . Marital status: Married    Spouse name: N/A  . Number of children: 0  . Years of education: N/A   Occupational History  . disabled Unemployed   Social History Main Topics  . Smoking status: Never Smoker  . Smokeless tobacco: Never Used  . Alcohol use No  . Drug use: No  . Sexual activity: Not  Currently   Other Topics Concern  . None   Social History Narrative  . None   Additional Social History:    Allergies:   Allergies  Allergen Reactions  . Aripiprazole Rash and Swelling  . Fentanyl     Other reaction(s): Other Sleep Walking  . Avapro [Irbesartan] Other (See Comments)    hyponatremia  . Sulfamethoxazole-Trimethoprim Nausea And Vomiting  . Bupropion Rash  . Ibuprofen Nausea And Vomiting    Labs:  Results for orders placed or performed during the hospital encounter of 05/26/16 (from the past 48 hour(s))  Comprehensive metabolic panel     Status: Abnormal   Collection Time: 05/26/16  3:32 AM  Result Value Ref Range   Sodium 137 135 - 145 mmol/L   Potassium 3.8 3.5 - 5.1 mmol/L   Chloride 103 101 - 111 mmol/L   CO2 25 22 - 32 mmol/L   Glucose, Bld 106 (H) 65 - 99  mg/dL   BUN 19 6 - 20 mg/dL   Creatinine, Ser 0.69 0.44 - 1.00 mg/dL   Calcium 9.5 8.9 - 10.3 mg/dL   Total Protein 9.0 (H) 6.5 - 8.1 g/dL   Albumin 5.4 (H) 3.5 - 5.0 g/dL   AST 40 15 - 41 U/L   ALT 34 14 - 54 U/L   Alkaline Phosphatase 101 38 - 126 U/L   Total Bilirubin 0.9 0.3 - 1.2 mg/dL   GFR calc non Af Amer >60 >60 mL/min   GFR calc Af Amer >60 >60 mL/min    Comment: (NOTE) The eGFR has been calculated using the CKD EPI equation. This calculation has not been validated in all clinical situations. eGFR's persistently <60 mL/min signify possible Chronic Kidney Disease.    Anion gap 9 5 - 15  Ethanol     Status: None   Collection Time: 05/26/16  3:32 AM  Result Value Ref Range   Alcohol, Ethyl (B) <5 <5 mg/dL    Comment:        LOWEST DETECTABLE LIMIT FOR SERUM ALCOHOL IS 5 mg/dL FOR MEDICAL PURPOSES ONLY   Salicylate level     Status: None   Collection Time: 05/26/16  3:32 AM  Result Value Ref Range   Salicylate Lvl <3.8 2.8 - 30.0 mg/dL  Acetaminophen level     Status: Abnormal   Collection Time: 05/26/16  3:32 AM  Result Value Ref Range   Acetaminophen (Tylenol), Serum <10  (L) 10 - 30 ug/mL    Comment:        THERAPEUTIC CONCENTRATIONS VARY SIGNIFICANTLY. A RANGE OF 10-30 ug/mL MAY BE AN EFFECTIVE CONCENTRATION FOR MANY PATIENTS. HOWEVER, SOME ARE BEST TREATED AT CONCENTRATIONS OUTSIDE THIS RANGE. ACETAMINOPHEN CONCENTRATIONS >150 ug/mL AT 4 HOURS AFTER INGESTION AND >50 ug/mL AT 12 HOURS AFTER INGESTION ARE OFTEN ASSOCIATED WITH TOXIC REACTIONS.   cbc     Status: None   Collection Time: 05/26/16  3:32 AM  Result Value Ref Range   WBC 7.7 4.0 - 10.5 K/uL   RBC 4.85 3.87 - 5.11 MIL/uL   Hemoglobin 13.9 12.0 - 15.0 g/dL   HCT 41.6 36.0 - 46.0 %   MCV 85.8 78.0 - 100.0 fL   MCH 28.7 26.0 - 34.0 pg   MCHC 33.4 30.0 - 36.0 g/dL   RDW 13.0 11.5 - 15.5 %   Platelets 322 150 - 400 K/uL  Rapid urine drug screen (hospital performed)     Status: Abnormal   Collection Time: 05/26/16  3:57 AM  Result Value Ref Range   Opiates NONE DETECTED NONE DETECTED   Cocaine NONE DETECTED NONE DETECTED   Benzodiazepines POSITIVE (A) NONE DETECTED   Amphetamines NONE DETECTED NONE DETECTED   Tetrahydrocannabinol NONE DETECTED NONE DETECTED   Barbiturates NONE DETECTED NONE DETECTED    Comment:        DRUG SCREEN FOR MEDICAL PURPOSES ONLY.  IF CONFIRMATION IS NEEDED FOR ANY PURPOSE, NOTIFY LAB WITHIN 5 DAYS.        LOWEST DETECTABLE LIMITS FOR URINE DRUG SCREEN Drug Class       Cutoff (ng/mL) Amphetamine      1000 Barbiturate      200 Benzodiazepine   937 Tricyclics       342 Opiates          300 Cocaine          300 THC              50  Current Facility-Administered Medications  Medication Dose Route Frequency Provider Last Rate Last Dose  . acetaminophen (TYLENOL) tablet 650 mg  650 mg Oral Q4H PRN Davonna Belling, MD   650 mg at 05/26/16 0618  . aspirin EC tablet 81 mg  81 mg Oral Daily Davonna Belling, MD   81 mg at 05/26/16 1043  . carbamazepine (EQUETRO) 12 hr capsule 600 mg  600 mg Oral BID Ambrose Finland, MD      . metoprolol  tartrate (LOPRESSOR) tablet 100 mg  100 mg Oral BID Davonna Belling, MD   100 mg at 05/26/16 1046  . venlafaxine XR (EFFEXOR-XR) 24 hr capsule 150 mg  150 mg Oral Q breakfast Davonna Belling, MD       Current Outpatient Prescriptions  Medication Sig Dispense Refill  . aspirin 81 MG EC tablet Take 81 mg by mouth every morning.     . Buprenorphine (BUTRANS) 20 MCG/HR PTWK Place 20 mcg onto the skin every Tuesday.     Marland Kitchen BUTRANS 10 MCG/HR PTWK patch Take 10 mcg by mouth daily as needed (for pain).   2  . clonazePAM (KLONOPIN) 1 MG tablet 1 tablet in the am, and 2 tablets at night    . desvenlafaxine (PRISTIQ) 100 MG 24 hr tablet Take 100 mg by mouth daily.    . diphenhydramine-acetaminophen (TYLENOL PM) 25-500 MG TABS tablet Take 1 tablet by mouth at bedtime as needed (pain).    . EQUETRO 300 MG CP12 Take 600 mg by mouth 2 (two) times daily.   11  . metoprolol (LOPRESSOR) 100 MG tablet Take 100 mg by mouth 2 (two) times daily.    . NON FORMULARY at bedtime. BI-Pap    . NONFORMULARY OR COMPOUNDED ITEM Take 1 application by mouth 4 (four) times daily as needed. Topically on cheek and inside mouth of mouth to calm trigeminal nerve    . NUCYNTA 100 MG TABS Take 250 mg by mouth 3 (three) times daily.    . hydrOXYzine (ATARAX/VISTARIL) 25 MG tablet Take 1 tablet (25 mg total) by mouth 3 (three) times daily as needed. (Patient not taking: Reported on 05/26/2016) 30 tablet 0  . metoprolol (LOPRESSOR) 50 MG tablet Take 1 tablet (50 mg total) by mouth 2 (two) times daily. (Patient not taking: Reported on 05/26/2016) 180 tablet 1  . triamcinolone cream (KENALOG) 0.1 % Apply 1 application topically 2 (two) times daily. (Patient not taking: Reported on 05/26/2016) 453.6 g 0    Musculoskeletal: Strength & Muscle Tone: within normal limits Gait & Station: normal Patient leans: N/A  Psychiatric Specialty Exam: Physical Exam  Constitutional: She is oriented to person, place, and time. She appears  well-developed and well-nourished.  HENT:  Head: Normocephalic.  Neck: Normal range of motion.  Respiratory: Effort normal.  Musculoskeletal: Normal range of motion.  Neurological: She is alert and oriented to person, place, and time.  Skin: Skin is warm and dry.  Psychiatric: Her speech is normal and behavior is normal. Judgment normal. Cognition and memory are normal. She exhibits a depressed mood. She expresses suicidal ideation. She expresses suicidal plans.    Review of Systems  Constitutional: Negative.   HENT: Negative.   Eyes: Negative.   Respiratory: Negative.   Cardiovascular: Negative.   Gastrointestinal: Negative.   Genitourinary: Negative.   Musculoskeletal: Negative.   Skin: Negative.   Neurological: Negative.   Endo/Heme/Allergies: Negative.   Psychiatric/Behavioral: Positive for depression and suicidal ideas.    Blood pressure (!) 162/104, pulse 93,  temperature 98 F (36.7 C), temperature source Oral, resp. rate 17, height '5\' 7"'  (1.702 m), weight 68 kg (150 lb), SpO2 100 %.Body mass index is 23.49 kg/m.  General Appearance: Disheveled  Eye Contact:  Good  Speech:  Clear and Coherent  Volume:  Normal  Mood:  Depressed  Affect:  Depressed and Flat  Thought Process:  Coherent  Orientation:  Full (Time, Place, and Person)  Thought Content:  WDL and Logical  Suicidal Thoughts:  Yes, overdose  Homicidal Thoughts:  No  Memory:  Immediate;   Good Recent;   Good Remote;   Good  Judgement:  Good  Insight:  Fair  Psychomotor Activity:  Normal  Concentration:  Concentration: Good and Attention Span: Good  Recall:  Good  Fund of Knowledge:  Good  Language:  Good  Akathisia:  Negative  Handed:  Right  AIMS (if indicated):     Assets:  Communication Skills Desire for Improvement Financial Resources/Insurance Housing Transportation  ADL's:  Intact  Cognition:  WNL  Sleep:        Treatment Plan Summary: Plan Major Depressive Disorder, Recurrent Severe  without psychotic features  -Crisis stabilization -Medication management:  Continue medical medications except narcotics along with her Equetro 600 mg BID for mood stabilization, Effexor-XR 150 mg day for mood stabilization, Pristiq 100 mg daily for mood stabilization, Vistaril 25 mg TID prn for anxiety.  Started Trazodone 100 mg at bedtime for sleep.  Did not restart her Klonopin due to her taking too many last night and needs to clear. -Individual counseling  Disposition: Recommend psychiatric Inpatient admission when medically cleared.  Waylan Boga, NP 05/26/2016 11:12 AM   Patient is seen face to face for this psych evaluation, case discussed with treatment team and physician extender. Formulated treatment plan and reviewed the information documented and agree with the treatment plan.  Karron Goens 05/27/2016 9:15 AM

## 2016-05-26 NOTE — ED Notes (Signed)
Patient eating some orange sherbet.

## 2016-05-26 NOTE — Progress Notes (Signed)
Patient was referred to [facility] and Patrina Levering at the patients request for treatment.  [place] Disposition CSW [phone]

## 2016-05-26 NOTE — ED Notes (Signed)
Patient c/o mouth pain due to her trigeminal neuralgia.  She has refused food for two meals now and is only drinking ice water.  Asked patient if she thought she could eat some pudding.  She said she might eat a little bit of that.  Dr. Lenna Sciara aware of patient's condition. Patient rating her pain as a 5 or a 6.

## 2016-05-26 NOTE — ED Notes (Signed)
Tiffany called report on patient.

## 2016-05-26 NOTE — ED Notes (Signed)
Soft tray ordered for patient.

## 2016-05-26 NOTE — Progress Notes (Signed)
Patient states her home machine is a BiPAP. Order received to use nocturnally. Equipment set up and appears in good, clean, working condition. No frayed cords. Patient prefers self placement of mask. RT will continue to follow.

## 2016-05-27 DIAGNOSIS — F3132 Bipolar disorder, current episode depressed, moderate: Secondary | ICD-10-CM | POA: Insufficient documentation

## 2016-05-27 NOTE — ED Notes (Signed)
No respiratory or acute distress noted resting in bed with eyes closed call light in reach. 

## 2016-06-06 ENCOUNTER — Encounter: Payer: Self-pay | Admitting: Family Medicine

## 2016-06-06 ENCOUNTER — Ambulatory Visit (INDEPENDENT_AMBULATORY_CARE_PROVIDER_SITE_OTHER): Payer: PRIVATE HEALTH INSURANCE

## 2016-06-06 ENCOUNTER — Ambulatory Visit (INDEPENDENT_AMBULATORY_CARE_PROVIDER_SITE_OTHER): Payer: PRIVATE HEALTH INSURANCE | Admitting: Family Medicine

## 2016-06-06 ENCOUNTER — Telehealth: Payer: Self-pay | Admitting: Family Medicine

## 2016-06-06 VITALS — BP 150/88 | HR 67 | Temp 98.2°F | Ht 67.0 in | Wt 146.8 lb

## 2016-06-06 DIAGNOSIS — M79671 Pain in right foot: Secondary | ICD-10-CM | POA: Diagnosis not present

## 2016-06-06 DIAGNOSIS — M25561 Pain in right knee: Secondary | ICD-10-CM

## 2016-06-06 DIAGNOSIS — M25571 Pain in right ankle and joints of right foot: Secondary | ICD-10-CM

## 2016-06-06 DIAGNOSIS — R296 Repeated falls: Secondary | ICD-10-CM | POA: Diagnosis not present

## 2016-06-06 DIAGNOSIS — Z79899 Other long term (current) drug therapy: Secondary | ICD-10-CM | POA: Diagnosis not present

## 2016-06-06 DIAGNOSIS — M79604 Pain in right leg: Secondary | ICD-10-CM

## 2016-06-06 DIAGNOSIS — S8261XA Displaced fracture of lateral malleolus of right fibula, initial encounter for closed fracture: Secondary | ICD-10-CM | POA: Diagnosis not present

## 2016-06-06 NOTE — Telephone Encounter (Signed)
Called pt, someone answered & stated that pt was unavailable.

## 2016-06-06 NOTE — Telephone Encounter (Signed)
Patient is at her parents house (two hours away).  She feel down their steps yesterday and has done something to her right foot.  Ankle is swollen and blue and knee is swollen and bruised.  Patient is requesting to be worked in either this afternoon or later this week.  Please follow up in regard.

## 2016-06-06 NOTE — Patient Instructions (Addendum)
I will refer you to orthopedics, for now use the Cam Walker and try to keep this much weight off of that foot as possible. Use the walker as needed. If you are having more pain with placing weight on that foot, would recommend crutches as we discussed.  I ordered the CT scan of your ankle and foot to look into other possible fractures, and that should be ready by the time you see orthopedics. If change in your usual pain medicines are needed for this current pain, please contact your pain management specialist or they can let me know if there is a medication they're okay with me prescribing.  As we discussed, I am concerned about your multiple falls this year. Please discuss your medications in these falls with both her primary care provider as well as [facility]. Make sure you get up slowly from bed in the morning, as your medications may cause you to be lightheaded or dizzy first thing in the morning.  If any headache, vision changes, dizziness or lightheadedness or other new neurologic symptoms, go to the emergency room as you may have hit your head during the fall.   Return to the clinic or go to the nearest emergency room if any of your symptoms worsen or new symptoms occur.     IF you received an x-ray today, you will receive an invoice from [facility]. Please contact [facility] at [phone] with questions or concerns regarding your invoice.   IF you received labwork today, you will receive an invoice from [employer]. Please contact [facility] at [phone] with questions or concerns regarding your invoice.   Our billing staff will not be able to assist you with questions regarding bills from these companies.  You will be contacted with the lab results as soon as they are available. The fastest way to get your results is to activate your My Chart account. Instructions are located on the last page of this paperwork. If you have not  heard from [place] regarding the results in 2 weeks, please contact this office.

## 2016-06-06 NOTE — Telephone Encounter (Signed)
Sorry but booked.  Can see Marya Amsler or will need to wait until next available new patient.  Can go to urgent care or Spencer Copland at [facility]

## 2016-06-06 NOTE — Progress Notes (Addendum)
Subjective:  By signing my name below, I, Sheryl Lester, attest that this documentation has been prepared under the direction and in the presence of [name], MD. Electronically Signed: [name], [name]. 06/06/2016 , 2:52 PM .  Patient was seen in Room 9 .   Patient ID: Sheryl Lester, female    DOB: 1956-04-28, 60 y.o.   MRN: [id number] Chief Complaint  Patient presents with  . Leg Pain    Rt Knee, lower leg, ankle and foot injury from a fall on stairs x yesterday   HPI Sheryl Lester is a 60 y.o. female  Here for right knee, leg ankle, and foot pain from a fall on stairs which occurred yesterday. She has a history of bipolar depression. She was seen in the ER 11 days ago for worsening depression symptoms. She's also followed by [facility] for [place] and [place]. She also has h/o HTN; her PCP is Hoyt Koch, MD.   Patient states she fell down the top of a set of stairs to the bottom, about 25-30 steps, which occurred yesterday. She notes she was carrying something at that time. She did have a spot over her forehead, but believes it was a rug burn. She denies LOC, headache, nausea, vomiting or blurry vision. She denies headache today. She was able to bear weight but painful. She immediately applied ice to the area, and tried not bearing any weight on it. She noticed discoloration and some swelling over her right foot and leg. She notes more pain when turning her foot. She denies hip or back pain. She fell earlier this year over pavement on May 10th and had possible small break in her right foot; no xray was done at that time. She's had 4 falls over the past 6 months.   She is followed by pain management for TN2. She denies chronic pain in her lower extremities. Her orthopedist is Dr. Hulan Saas. She also reports having some right arm pain that started a few weeks ago. She has not discussed her falls with her PCP. She takes BP medication and aspirin  81mg . She denies missing any doses of her medication.   Patient Active Problem List   Diagnosis Date Noted  . Major depressive disorder, recurrent severe without psychotic features ([place]) 05/26/2016  . Rash and nonspecific skin eruption 03/21/2016  . Encounter for therapeutic drug monitoring 03/02/2016  . Cholecystitis with cholelithiasis 12/12/2015  . 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 Osteopenia 12/23/2012  . Skin lesion 09/23/2012  . Hearing loss 09/23/2012  . Melanoma ([place]) 07/05/2011  . Preventative health care 03/01/2011  . PVD 05/12/2010  . 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 HYPERTENSION 12/16/2008  . TIA 12/16/2008  . GERD 12/06/2008  . Unspecified hearing loss 09/21/2008  . HYPERLIPIDEMIA 01/20/2008  . ANEMIA-IRON DEFICIENCY 01/20/2008  . ANXIETY 01/20/2008  . DEPRESSION 01/20/2008  . Trigeminal neuralgia 01/20/2008  . ALLERGIC RHINITIS 01/20/2008   Past Medical History:  Diagnosis Date  . ALLERGIC RHINITIS 01/20/2008  . ANEMIA-IRON DEFICIENCY 01/20/2008  . ANXIETY 01/20/2008  . ASTHMATIC BRONCHITIS, ACUTE 05/19/2008  . Bipolar disorder ([place])   . CENTRAL SLEEP APNEA CONDS CLASSIFIED ELSEWHERE 08/31/2010  . Complication of anesthesia 1987   "w/one of the anesthesia agents that they don't use anymore; chest muscles constricted; felt like I couldn't breath"  . DEPRESSION 01/20/2008  . DYSPHAGIA UNSPECIFIED 12/06/2008  . ELEVATED Lester PRESSURE WITHOUT DIAGNOSIS OF HYPERTENSION 12/06/2008  .  GERD   . History of sleep walking   . HYPERLIPIDEMIA 01/20/2008  . HYPERTENSION 12/16/2008  . Melanoma ([place]) 2012   "skin of my stomach"  . Migraine    "related to menses; went away after hysterectomy"  . OSA treated with BiPAP   . OTITIS MEDIA, ACUTE, BILATERAL 12/16/2008  . OTITIS MEDIA, SEROUS, CHRONIC 12/30/2008  . PVD 05/12/2010  . Pyelonephritis "hospitalized in ~ 1991"  . SINUSITIS- ACUTE-NOS 09/21/2008  . SWELLING MASS OR LUMP IN  HEAD AND NECK 09/21/2008  . THYROID NODULE, LEFT    "unable to aspirate; has gotten very small"   . Trigeminal neuralgia 01/20/2008  . Unspecified hearing loss 09/21/2008  . VENOUS INSUFFICIENCY, LEGS 05/03/2010   Past Surgical History:  Procedure Laterality Date  . ABDOMINAL HYSTERECTOMY  2000  . APPENDECTOMY  2000  . BRAIN SURGERY    . BREAST BIOPSY Left 1994   Benign  . 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 CHOLECYSTECTOMY N/A 12/13/2015   Procedure: LAPAROSCOPIC CHOLECYSTECTOMY;  Surgeon: Mickeal Skinner, MD;  Location: [facility];  Service: General;  Laterality: N/A;  . INGUINAL HERNIA REPAIR Right 1987  . MELANOMA EXCISION  2015   "skin of my stomach"  . OOPHORECTOMY Bilateral 2000   Allergies  Allergen Reactions  . Aripiprazole Rash and Swelling  . Fentanyl     Other reaction(s): Other Sleep Walking  . Avapro [Irbesartan] Other (See Comments)    hyponatremia  . Sulfamethoxazole-Trimethoprim Nausea And Vomiting  . Bupropion Rash  . Ibuprofen Nausea And Vomiting   Prior to Admission medications   Medication Sig Start Date End Date Taking? Authorizing Provider  aspirin 81 MG EC tablet Take 81 mg by mouth every morning.     Historical Provider, MD  Buprenorphine (BUTRANS) 20 MCG/HR PTWK Place 20 mcg onto the skin every Tuesday.     Historical Provider, MD  BUTRANS 10 MCG/HR PTWK patch Take 10 mcg by mouth daily as needed (for pain).  11/30/15   Historical Provider, MD  clonazePAM (KLONOPIN) 1 MG tablet 1 tablet in the am, and 2 tablets at night 03/07/14   Historical Provider, MD  desvenlafaxine (PRISTIQ) 100 MG 24 hr tablet Take 100 mg by mouth daily. 11/08/15   Historical Provider, MD  diphenhydramine-acetaminophen (TYLENOL PM) 25-500 MG TABS tablet Take 1 tablet by mouth at bedtime as needed (pain).    Historical Provider, MD  EQUETRO 300 MG CP12 Take 600 mg by mouth 2 (two) times daily.  11/30/15   Historical Provider, MD  hydrOXYzine  (ATARAX/VISTARIL) 25 MG tablet Take 1 tablet (25 mg total) by mouth 3 (three) times daily as needed. Patient not taking: Reported on 05/26/2016 03/21/16   Hoyt Koch, MD  metoprolol (LOPRESSOR) 100 MG tablet Take 100 mg by mouth 2 (two) times daily.    Historical Provider, MD  metoprolol (LOPRESSOR) 50 MG tablet Take 1 tablet (50 mg total) by mouth 2 (two) times daily. Patient not taking: Reported on 05/26/2016 04/03/16   Hoyt Koch, MD  NON FORMULARY at bedtime. BI-Pap    Historical Provider, MD  NONFORMULARY OR COMPOUNDED ITEM Take 1 application by mouth 4 (four) times daily as needed. Topically on cheek and inside mouth of mouth to calm trigeminal nerve 10/31/15   Historical Provider, MD  NUCYNTA 100 MG TABS Take 250 mg by mouth 3 (three) times daily. 12/09/15   Historical Provider, MD  triamcinolone cream (KENALOG) 0.1 % Apply  1 application topically 2 (two) times daily. Patient not taking: Reported on 05/26/2016 03/21/16   Hoyt Koch, MD   Social History   Social History  . Marital status: Married    Spouse name: N/A  . Number of children: 0  . Years of education: N/A   Occupational History  . disabled Unemployed   Social History Main Topics  . Smoking status: Never Smoker  . Smokeless tobacco: Never Used  . Alcohol use No  . Drug use: No  . Sexual activity: Not Currently   Other Topics Concern  . Not on file   Social History Narrative  . No narrative on file   Review of Systems  Constitutional: Negative for chills, fatigue and fever.  Musculoskeletal: Positive for arthralgias, gait problem, joint swelling and myalgias.  Skin: Negative for rash and wound.  Neurological: Negative for dizziness, syncope, weakness, light-headedness, numbness and headaches.       Objective:   Physical Exam  Constitutional: She is oriented to person, place, and time. She appears well-developed and well-nourished. No distress.  HENT:  Head: Normocephalic and  atraumatic.  Very tiny abrasion over left frontal scalp  Eyes: EOM are normal. Pupils are equal, round, and reactive to light.  Neck: Neck supple.   Pain free rom c-spine  Cardiovascular: Normal rate.   Pulmonary/Chest: Effort normal. No respiratory distress.  Musculoskeletal: Normal range of motion.  Right foot: cap refill <1 second, ecchymosis over medial > lateral aspect, diffuse soft tissue swelling extending from lower leg into right foot, no focal metatarsal tenderness, tender along navicula, minimal 5th metatarsal proximal tenderness, bilateral malleoli tenderness, diffuse ankle tenderness medial > lateral Right tib fib: tender distal 3rd of tibia, guarded ankle ROM, minimal movement, also tender distal 3rd fibula, soft tissue swelling Right knee: no patellar tenderness, slight faint ecchymosis over the anterior lower knee, pain with flexion extension into knee cap but no focal bony tenderness, full ROM, skin intact, no effusion Right Hip: pain free internal and external rotation  Neurological: She is alert and oriented to person, place, and time.  Skin: Skin is warm and dry.  Psychiatric: She has a normal mood and affect. Her behavior is normal.  Nursing note and vitals reviewed.   Vitals:   06/06/16 1414  BP: (!) 178/106  Pulse: 67  Temp: 98.2 F (36.8 C)  TempSrc: Oral  Weight: 146 lb 12.8 oz (66.6 kg)  Height: 5\' 7"  (1.702 m)   Dg Knee 1-2 Views Right  Result Date: 06/06/2016 CLINICAL DATA:  Fall yesterday, initial encounter. Pain, swelling and bruising. EXAM: RIGHT KNEE - 1-2 VIEW COMPARISON:  None. FINDINGS: No joint effusion or fracture.  No degenerative changes. IMPRESSION: Negative. Electronically Signed   By: Lorin Picket M.D.   On: 06/06/2016 15:11   Dg Tibia/fibula Right  Result Date: 06/06/2016 CLINICAL DATA:  Fall yesterday, pain, swelling and bruising. Initial encounter. EXAM: RIGHT TIBIA AND FIBULA - 2 VIEW COMPARISON:  None. FINDINGS: There is a  minimally displaced avulsion fracture off the lateral aspect of the lateral malleolus, with overlying soft tissue swelling. Additional irregularity is seen along the talocalcaneal joint, laterally. There may be a partially imaged cuboid fracture as well. IMPRESSION: 1. Minimally displaced lateral malleolar fracture. 2. Possible injury at the talocalcaneal joint, laterally. 3. Possible cuboid fracture. 4. CT may be helpful in further evaluation, as clinically indicated. Electronically Signed   By: Lorin Picket M.D.   On: 06/06/2016 15:14   Dg Ankle Complete Right  Result Date: 06/06/2016 CLINICAL DATA:  Fall, initial encounter. Pain, swelling and bruising. EXAM: RIGHT ANKLE - COMPLETE 3+ VIEW COMPARISON:  None. FINDINGS: Soft tissue swelling is seen about the ankle joint. There is a slightly displaced fracture off the lateral aspect of the lateral malleolus. Additional lucency and irregularity are seen along the lateral aspect of talus. Associated joint effusion. IMPRESSION: 1. Lateral malleolar fracture. 2. Suspect a lateral talar fracture. 3. Consider additional evaluation with CT. 4. Joint effusion. Electronically Signed   By: Lorin Picket M.D.   On: 06/06/2016 15:19   Dg Foot Complete Right  Result Date: 06/06/2016 CLINICAL DATA:  Fall yesterday, initial encounter. Pain, swelling and bruising. EXAM: RIGHT FOOT COMPLETE - 3+ VIEW COMPARISON:  None. FINDINGS: There is a slightly displaced fracture off the lateral aspect of the lateral malleolus. Irregularity is seen along the inferolateral aspect of the cuboid. Irregularity is also seen along the medial aspect of the tarsal navicular. There is a joint effusion. IMPRESSION: 1. Slightly displaced lateral malleolar fracture. 2. Probable cuboid fracture. 3. Possible tarsal navicular fracture. 4. Further evaluation with CT is recommended, as clinically indicated. 5. Joint effusion. Electronically Signed   By: Lorin Picket M.D.   On: 06/06/2016 15:17     3:57 PM Results discussed with patient. Advised that most likely nonweightbearing with crutches and cam walker would be initial treatment until either CT scan or evaluation with orthopedist. She refused crutches as feels may fall with her cats at home, and would have difficulty using those. She does have a walker that she did say she could use, and did agree with cam walker.      Assessment & Plan:  Sheryl Lester is a 60 y.o. female Leg pain, right - Plan: DG Tibia/Fibula Right  Acute pain of right knee - Plan: DG Knee 1-2 Views Right  Acute right ankle pain - Plan: DG Foot Complete Right, AMB referral to orthopedics, CT ANKLE RIGHT WO CONTRAST  Right foot pain - Plan: DG Ankle Complete Right, AMB referral to orthopedics, CT FOOT RIGHT WO CONTRAST  Multiple falls  High risk medication use  Closed displaced fracture of lateral malleolus of right fibula, initial encounter - Plan: AMB referral to orthopedics, CT ANKLE RIGHT WO CONTRAST, Apply cam walker   Lateral malleolus fracture with suspected lateral talus fracture, possible cuboid and navicular fractures after fall down stairs yesterday. She has been weightbearing until this time.   -Will check CT ankle and foot for further clarification of other fractures.  - Discussed with orthopedics, and will have her follow-up with orthopedics after CT scanning performed.   -Tall Cam Walker placed, recommended crutches, but as above she declined this. She does have a walker at home, minimal to no weightbearing was discussed.  - If change in pain medications needed, advised to call her pain management provider, or they can let me know if there are any medications I can prescribe based on her contract   History of falls, on multiple sedating medications, and concerning increased falls this year. Advised to maintain hydration, discuss her chronic pain medications as well as Klonopin with her primary care provider and pain management providers.  Fall precautions discussed in the meantime.  Denies headache or known head injury, but she did have a small superficial abrasion on her scalp. Advised if any headache, blurry vision, diplopia, or any new neurologic symptoms to be seen right away.  No orders of the defined types were placed in this encounter.  Patient Instructions   I will refer you to orthopedics, for now use the Cam Walker and try to keep this much weight off of that foot as possible. Use the walker as needed. If you are having more pain with placing weight on that foot, would recommend crutches as we discussed.  I ordered the CT scan of your ankle and foot to look into other possible fractures, and that should be ready by the time you see orthopedics. If change in your usual pain medicines are needed for this current pain, please contact your pain management specialist or they can let me know if there is a medication they're okay with me prescribing.  As we discussed, I am concerned about your multiple falls this year. Please discuss your medications in these falls with both her primary care provider as well as [facility]. Make sure you get up slowly from bed in the morning, as your medications may cause you to be lightheaded or dizzy first thing in the morning.  If any headache, vision changes, dizziness or lightheadedness or other new neurologic symptoms, go to the emergency room as you may have hit your head during the fall.   Return to the clinic or go to the nearest emergency room if any of your symptoms worsen or new symptoms occur.     IF you received an x-ray today, you will receive an invoice from [facility]. Please contact [facility] at [phone] with questions or concerns regarding your invoice.   IF you received labwork today, you will receive an invoice from [employer]. Please contact [facility] at [phone] with questions or concerns regarding your  invoice.   Our billing staff will not be able to assist you with questions regarding bills from these companies.  You will be contacted with the lab results as soon as they are available. The fastest way to get your results is to activate your My Chart account. Instructions are located on the last page of this paperwork. If you have not heard from [place] regarding the results in 2 weeks, please contact this office.        I personally performed the services described in this documentation, which was scribed in my presence. The recorded information has been reviewed and considered, and addended by me as needed.   Signed,   [name], MD [facility].  06/06/16 6:39 PM

## 2016-06-07 ENCOUNTER — Ambulatory Visit
Admission: RE | Admit: 2016-06-07 | Discharge: 2016-06-07 | Disposition: A | Discharge status: Home / Self Care | Payer: PRIVATE HEALTH INSURANCE | Source: Ambulatory Visit | Attending: Family Medicine | Admitting: Family Medicine

## 2016-06-07 ENCOUNTER — Telehealth: Payer: Self-pay

## 2016-06-07 DIAGNOSIS — S8261XA Displaced fracture of lateral malleolus of right fibula, initial encounter for closed fracture: Secondary | ICD-10-CM

## 2016-06-07 DIAGNOSIS — M25571 Pain in right ankle and joints of right foot: Secondary | ICD-10-CM

## 2016-06-07 DIAGNOSIS — M79671 Pain in right foot: Secondary | ICD-10-CM

## 2016-06-07 NOTE — Telephone Encounter (Signed)
Pt requested a CD of all x-rays from her visit with Dr. Carlota Raspberry on 06/06/16 for her [facility] apt on 10/16. Someone will be picking them up for her, but she will call to let us know who.

## 2016-07-06 ENCOUNTER — Other Ambulatory Visit: Payer: Self-pay | Admitting: Internal Medicine

## 2016-07-06 DIAGNOSIS — Z1231 Encounter for screening mammogram for malignant neoplasm of breast: Secondary | ICD-10-CM

## 2016-08-08 ENCOUNTER — Ambulatory Visit
Admission: RE | Admit: 2016-08-08 | Discharge: 2016-08-08 | Disposition: A | Discharge status: Home / Self Care | Payer: PRIVATE HEALTH INSURANCE | Source: Ambulatory Visit | Attending: Internal Medicine | Admitting: Internal Medicine

## 2016-08-08 DIAGNOSIS — Z1231 Encounter for screening mammogram for malignant neoplasm of breast: Secondary | ICD-10-CM

## 2016-09-10 ENCOUNTER — Ambulatory Visit: Payer: 59 | Admitting: Psychology

## 2016-09-21 ENCOUNTER — Ambulatory Visit (INDEPENDENT_AMBULATORY_CARE_PROVIDER_SITE_OTHER): Payer: PRIVATE HEALTH INSURANCE | Admitting: Psychology

## 2016-09-21 DIAGNOSIS — F319 Bipolar disorder, unspecified: Secondary | ICD-10-CM

## 2016-10-05 ENCOUNTER — Ambulatory Visit: Payer: PRIVATE HEALTH INSURANCE | Admitting: Psychology

## 2016-10-13 ENCOUNTER — Other Ambulatory Visit: Payer: Self-pay | Admitting: Internal Medicine

## 2016-10-17 IMAGING — DX DG KNEE 1-2V*R*
2 series · 2 of 2 positions shown · non-contrast
Comparison: None.

CLINICAL DATA: Fall yesterday, initial encounter. Pain, swelling
and bruising.

EXAM:
RIGHT KNEE - 1-2 VIEW

[knee ap]
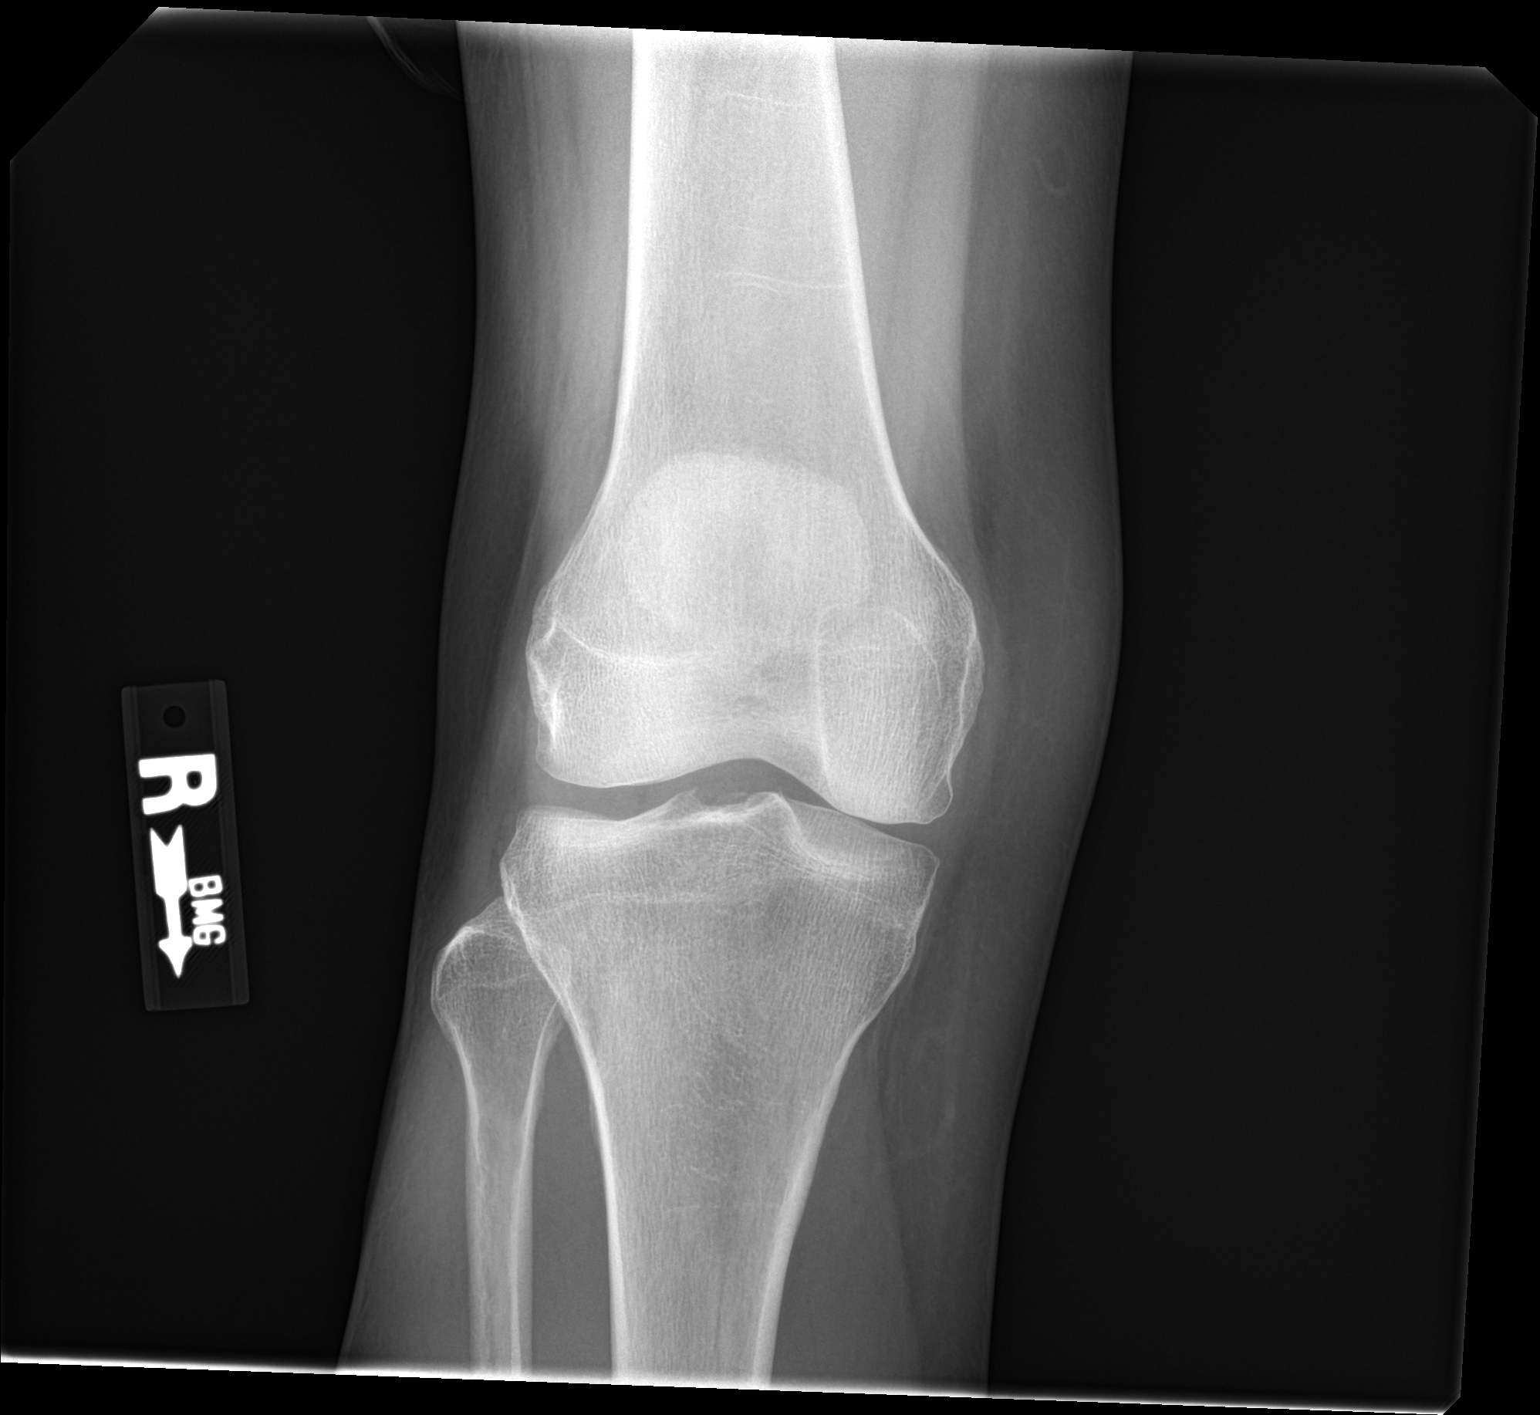

[knee lat]
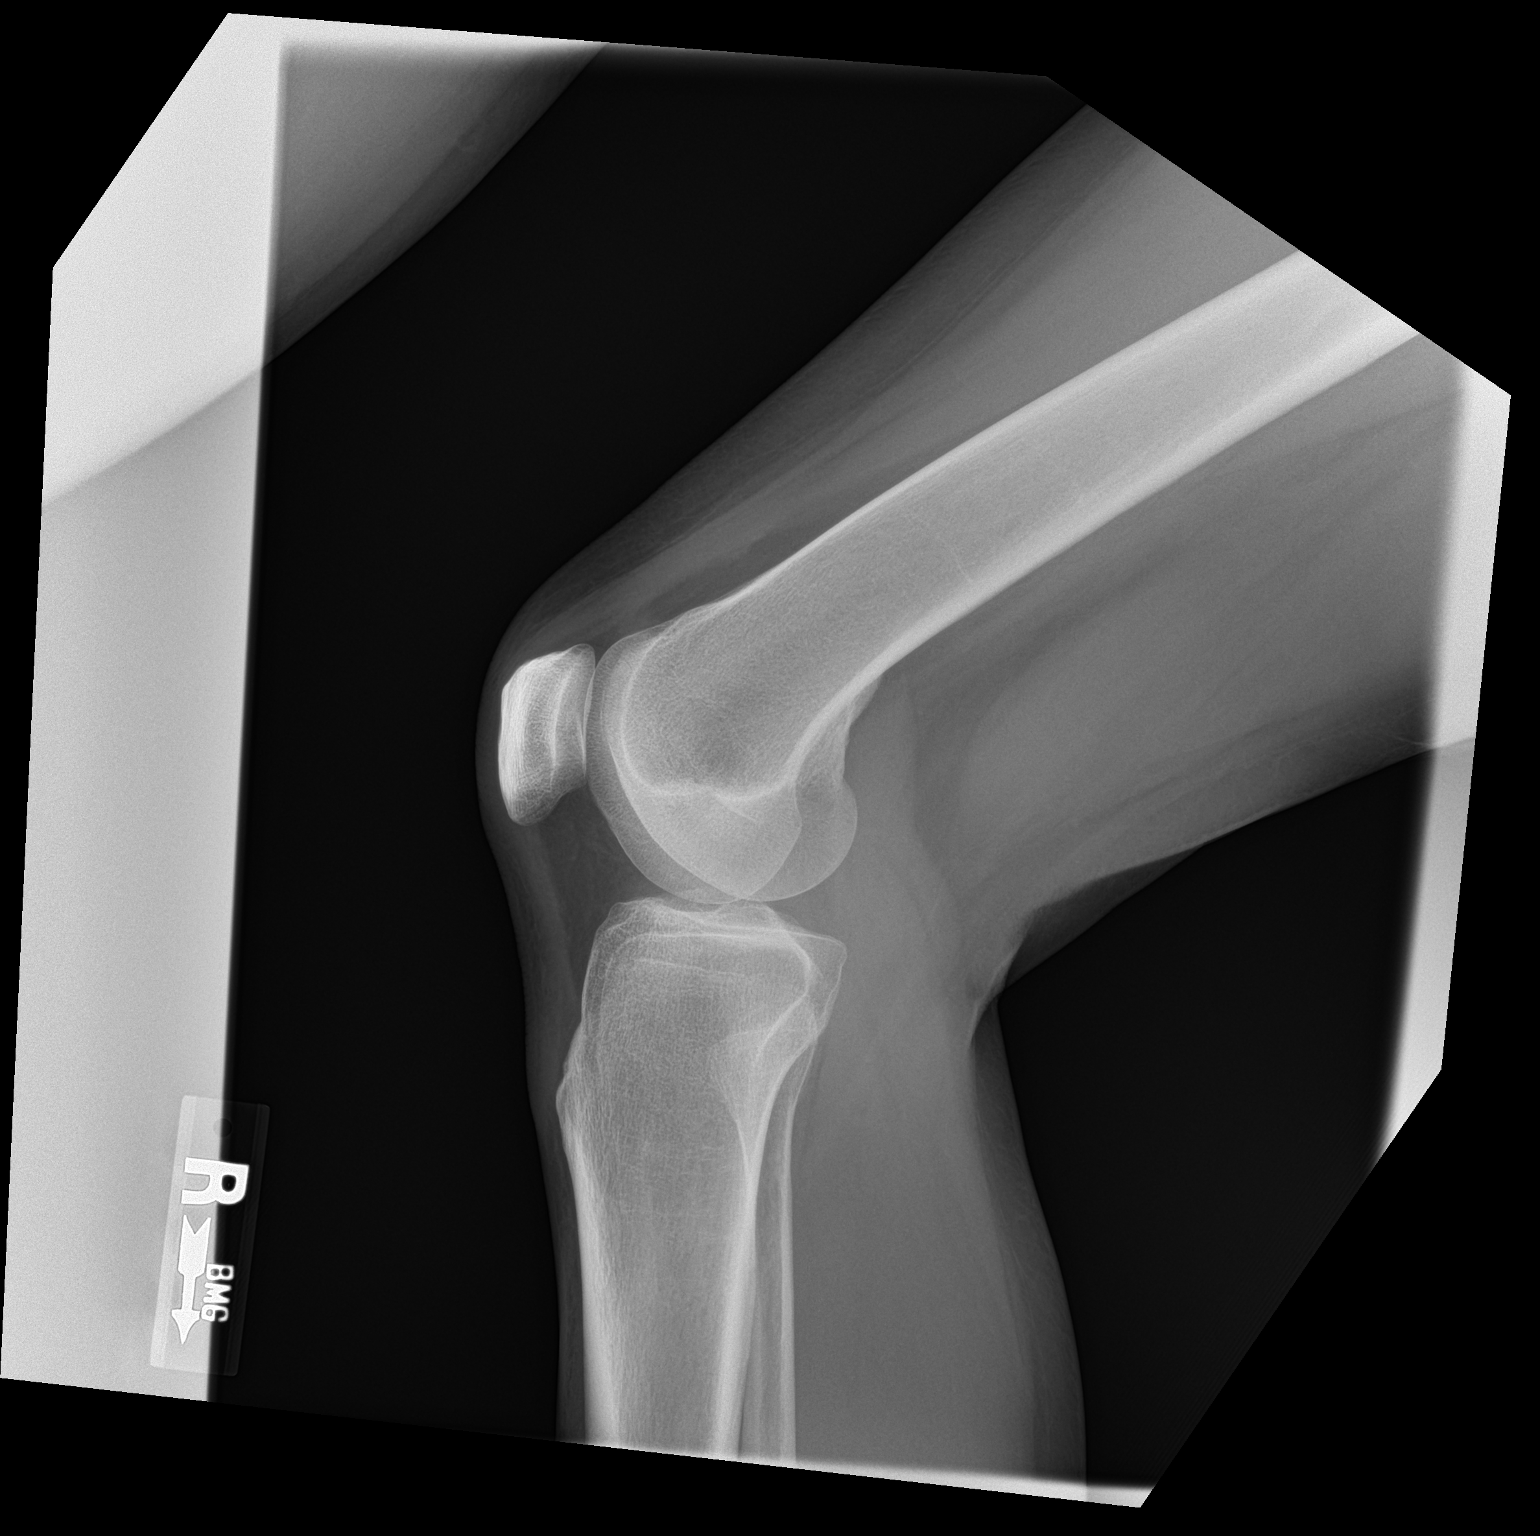

[2 of 2 positions shown; findings below may reference images not displayed]

FINDINGS: No joint effusion or fracture.  No degenerative changes.
IMPRESSION: Negative.

## 2016-10-26 ENCOUNTER — Ambulatory Visit (INDEPENDENT_AMBULATORY_CARE_PROVIDER_SITE_OTHER): Payer: PRIVATE HEALTH INSURANCE | Admitting: Psychology

## 2016-10-26 DIAGNOSIS — F319 Bipolar disorder, unspecified: Secondary | ICD-10-CM

## 2016-11-02 ENCOUNTER — Ambulatory Visit (INDEPENDENT_AMBULATORY_CARE_PROVIDER_SITE_OTHER): Payer: 59 | Admitting: Psychology

## 2016-11-02 DIAGNOSIS — F319 Bipolar disorder, unspecified: Secondary | ICD-10-CM

## 2016-11-20 ENCOUNTER — Encounter (HOSPITAL_COMMUNITY): Payer: Self-pay | Admitting: Psychiatry

## 2016-11-20 ENCOUNTER — Ambulatory Visit (INDEPENDENT_AMBULATORY_CARE_PROVIDER_SITE_OTHER): Payer: 59 | Admitting: Psychiatry

## 2016-11-20 VITALS — BP 103/76 | HR 72 | Ht 67.0 in | Wt 151.6 lb

## 2016-11-20 DIAGNOSIS — G4737 Central sleep apnea in conditions classified elsewhere: Secondary | ICD-10-CM | POA: Insufficient documentation

## 2016-11-20 DIAGNOSIS — F3175 Bipolar disorder, in partial remission, most recent episode depressed: Secondary | ICD-10-CM | POA: Diagnosis not present

## 2016-11-20 DIAGNOSIS — Z888 Allergy status to other drugs, medicaments and biological substances status: Secondary | ICD-10-CM

## 2016-11-20 DIAGNOSIS — F1190 Opioid use, unspecified, uncomplicated: Secondary | ICD-10-CM

## 2016-11-20 DIAGNOSIS — Z79899 Other long term (current) drug therapy: Secondary | ICD-10-CM

## 2016-11-20 DIAGNOSIS — Z7982 Long term (current) use of aspirin: Secondary | ICD-10-CM

## 2016-11-20 NOTE — Progress Notes (Signed)
Psychiatric Initial Adult Assessment   Patient Identification: Sheryl Lester MRN:  [id number] Date of Evaluation:  11/20/2016 Referral Source: self Chief Complaint:  depressed Visit Diagnosis:    ICD-9-CM ICD-10-CM   1. Bipolar disorder, in partial remission, most recent episode depressed ([place]) 296.55 F31.75    History of Present Illness:  Sheryl Lester is a 61 year old female with a psychiatric history of bipolar disorder, and a lifelong history of depression. She reports that her diagnosis of bipolar disorder was made approximately 7-8 years ago when she was 61 years old. She reports that she has struggled with ongoing mood lability over the past 7-8 years. She has been tried on multiple medications, with only partial improvement of her mood and depressive symptoms. She has seen multiple psychiatrists, previously Dr. Sabra Heck who retired, and most recently [facility], who she fired after she was sent for psychiatric hospitalization. The patient currently is in therapy with Dr. Cheryln Manly at [facility], and presents today for psychiatric medication management recommendations.  The patient shares that her sister is a geriatric psychiatrist in [place], and I made clear to this patient that her sister used to be one of my supervising attending's when I was a resident, so she should be aware of this fact as we engage in care.  The patient reports that she is aware of this, as she asked her sister about this Probation officer before she came to the appointment.  I spent some time with the patient learning about her childhood history, and adulthood, including her brief periods where she was able to work. She reports that she is struggled with significant depression and multiple medical issues. She shares that trigeminal neuralgia has contributed significantly to her depressive symptoms, chronic pain, and sense of hopelessness. She starts with telling Probation officer about a specific date, May 25 2016, when her sister called her to tell her that her mothers small cell cancer had returned.  The patient goes through, in quite vivid detail, all of the emotions, events, and interpersonal interactions after she learned that her mom was diagnosed with small cell cancer. She is quite detailed in describing these events comments and in some ways quite viscous with regard to negative interactions that she's had. She takes quite a long time to tell the story of her hospitalization, and her frustrations associated with this. She is circumstantial at times, diving into negative relationship dynamics with her husband, and previous negative conflicts with her sisters. She speaks briefly about her negative interactions with her dad, and his career as an internal medicine provider, and his now struggle with Parkinson's disease.    I pointed out to the patient, that she is quite detailed and lengthy in providing some of the historical account, and she tends to focus on the negative aspects of the interactions that she has with others. She was receptive to this feedback, and agreed that she feels like her mind is stuck in negativity, and stuck in the past. She feels that she continues to struggle with continued depression symptoms, feeling down and hopeless about the future at times, she even reports that she decided not to have children so that she could stop her "neurotic tendencies" from being passed on.  She admits shame for being quite cruel to her family members when she is upset with them, making verbal remarks that cannot be taken back are undone. She reports that she continues to feel quite irritable often. She has trouble sleeping due to irritability,  and anxiety. However, some days she will stay in bed for the whole day and sleep off and on throughout the day. Yesterday she slept most of the day. She continues to wonder if it would be better if she was dead, but denies any suicidal thoughts or intent.  She  denies any substance use, but feels heavily medicated due to her pain medication regimen for trigeminal neuralgia.  The patient and I spent time reviewing her most recent psychiatric [facility]. It appears that no medication changes were made at that time. She is also been fairly nonadherent to her medication regimens.  She reports that she has been tried on Abilify, Seroquel, Zyprexa, risperidone, Tegretol, Wellbutrin, clonazepam, Effexor, Pristiq.  We spent time discussing potential medication changes, but clearly that the patient has had multiple medication trials with inadequate response. We spent time discussing the possibility of an ECT consultation, and the patient was very open to this consultation. We provided the number for the [facility] to set up a consultation appointment with Dr. Carlena Hurl, and to follow-up with this writer in 3-4 weeks.    Associated Signs/Symptoms: Depression Symptoms:  depressed mood, anhedonia, hypersomnia, psychomotor retardation, fatigue, feelings of worthlessness/guilt, difficulty concentrating, recurrent thoughts of death, anxiety, (Hypo) Manic Symptoms:  Impulsivity, Irritable Mood, Labiality of Mood, Anxiety Symptoms:  Excessive Worry, Psychotic Symptoms:  none PTSD Symptoms: Negative  Past Psychiatric History: 2 psychiatric hospitalizations. One in October 2017, and one in 2006 at [facility]. Long-standing history of outpatient psychiatric care, most recently with [facility], with Dr. Albertine Patricia area and previously followed with Dr. Sabra Heck.  Previous Psychotropic Medications: Yes   Substance Abuse History in the last 12 months:  No.  Consequences of Substance Abuse: Negative  Past Medical History:  Past Medical History:  Diagnosis Date  . ALLERGIC RHINITIS 01/20/2008  . ANEMIA-IRON DEFICIENCY 01/20/2008  . ANXIETY 01/20/2008  . ASTHMATIC BRONCHITIS, ACUTE 05/19/2008  . Bipolar  disorder ([place])   . CENTRAL SLEEP APNEA CONDS CLASSIFIED ELSEWHERE 08/31/2010  . Complication of anesthesia 1987   "w/one of the anesthesia agents that they don't use anymore; chest muscles constricted; felt like I couldn't breath"  . DEPRESSION 01/20/2008  . DYSPHAGIA UNSPECIFIED 12/06/2008  . ELEVATED BLOOD PRESSURE WITHOUT DIAGNOSIS OF HYPERTENSION 12/06/2008  . GERD   . History of sleep walking   . HYPERLIPIDEMIA 01/20/2008  . HYPERTENSION 12/16/2008  . Melanoma ([place]) 2012   "skin of my stomach"  . Migraine    "related to menses; went away after hysterectomy"  . OSA treated with BiPAP   . OTITIS MEDIA, ACUTE, BILATERAL 12/16/2008  . OTITIS MEDIA, SEROUS, CHRONIC 12/30/2008  . PVD 05/12/2010  . Pyelonephritis "hospitalized in ~ 1991"  . SINUSITIS- ACUTE-NOS 09/21/2008  . SWELLING MASS OR LUMP IN HEAD AND NECK 09/21/2008  . THYROID NODULE, LEFT    "unable to aspirate; has gotten very small"   . Trigeminal neuralgia 01/20/2008  . Unspecified hearing loss 09/21/2008  . VENOUS INSUFFICIENCY, LEGS 05/03/2010    Past Surgical History:  Procedure Laterality Date  . ABDOMINAL HYSTERECTOMY  2000  . APPENDECTOMY  2000  . BRAIN SURGERY    . BREAST BIOPSY Left 1994   Benign  . 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 CHOLECYSTECTOMY N/A 12/13/2015   Procedure: LAPAROSCOPIC CHOLECYSTECTOMY;  Surgeon: Mickeal Skinner, MD;  Location: [facility];  Service: General;  Laterality: N/A;  . INGUINAL HERNIA REPAIR  Right 1987  . MELANOMA EXCISION  2015   "skin of my stomach"  . OOPHORECTOMY Bilateral 2000    Family Psychiatric History: Family psychiatric history of Parkinson's disease, depression  Family History:  Family History  Problem Relation Age of Onset  . Hyperlipidemia Mother   . Hypertension Mother   . Diabetes Father   . Melanoma Father   . Melanoma Sister   . Heart disease Paternal Grandmother   . Breast cancer Paternal Aunt   . Heart disease Other      Mother side of family-several uncles   . Colon cancer Neg Hx     Social History:   Social History   Social History  . Marital status: Married    Spouse name: N/A  . Number of children: 0  . Years of education: N/A   Occupational History  . disabled Unemployed   Social History Main Topics  . Smoking status: Never Smoker  . Smokeless tobacco: Never Used  . Alcohol use No  . Drug use: No  . Sexual activity: Not Currently   Other Topics Concern  . None   Social History Narrative  . None    Additional Social History: Currently married. No children. Not working  Allergies:   Allergies  Allergen Reactions  . Aripiprazole Rash and Swelling  . Fentanyl     Other reaction(s): Other Sleep Walking  . Avapro [Irbesartan] Other (See Comments)    hyponatremia  . Sulfamethoxazole-Trimethoprim Nausea And Vomiting  . Bupropion Rash  . Ibuprofen Nausea And Vomiting    Metabolic Disorder Labs: Lab Results  Component Value Date   HGBA1C 5.6 09/16/2015   No results found for: PROLACTIN Lab Results  Component Value Date   CHOL 205 (H) 09/16/2015   TRIG 91.0 09/16/2015   HDL 86.00 09/16/2015   CHOLHDL 2 09/16/2015   VLDL 18.2 09/16/2015   LDLCALC 101 (H) 09/16/2015   LDLCALC 71 06/02/2012     Current Medications: Current Outpatient Prescriptions  Medication Sig Dispense Refill  . aspirin 81 MG EC tablet Take 81 mg by mouth every morning.     . Buprenorphine (BUTRANS) 20 MCG/HR PTWK Place 20 mcg onto the skin every Tuesday.     Marland Kitchen BUTRANS 10 MCG/HR PTWK patch Take 10 mcg by mouth daily as needed (for pain).   2  . clonazePAM (KLONOPIN) 1 MG tablet 1 tablet in the am, and 2 tablets at night    . desvenlafaxine (PRISTIQ) 100 MG 24 hr tablet Take 100 mg by mouth daily.    Marland Kitchen EQUETRO 300 MG CP12 Take 600 mg by mouth 2 (two) times daily.   11  . metoprolol (LOPRESSOR) 100 MG tablet Take 100 mg by mouth 2 (two) times daily.    . NON FORMULARY at bedtime. BI-Pap    .  NONFORMULARY OR COMPOUNDED ITEM Take 1 application by mouth 4 (four) times daily as needed. Topically on cheek and inside mouth of mouth to calm trigeminal nerve    . NUCYNTA 100 MG TABS Take 250 mg by mouth 3 (three) times daily.     No current facility-administered medications for this visit.     Neurologic: Headache: Yes Seizure: Negative Paresthesias:Yes  Musculoskeletal: Strength & Muscle Tone: within normal limits  Gait & Station: normal Patient leans: N/A  Psychiatric Specialty Exam: Review of Systems  Constitutional: Negative.   HENT: Negative.   Respiratory: Negative.   Cardiovascular: Negative.   Gastrointestinal: Negative.   Musculoskeletal: Positive for joint pain.  Neurological: Positive for sensory change and headaches.  Psychiatric/Behavioral: Positive for depression and memory loss.    Blood pressure 103/76, pulse 72, height 5\' 7"  (1.702 m), weight 151 lb 9.6 oz (68.8 kg).Body mass index is 23.74 kg/m.  General Appearance: Casual and Fairly Groomed  Eye Contact:  Fair  Speech:  Clear and Coherent and Pressured  Volume:  Normal  Mood:  Irritable  Affect:  Congruent  Thought Process:  Goal Directed  Orientation:  Full (Time, Place, and Person)  Thought Content:  Rumination  Suicidal Thoughts:  No  Homicidal Thoughts:  No  Memory:  Immediate;   Fair  Judgement:  Fair  Insight:  Shallow  Psychomotor Activity:  Normal  Concentration:  Concentration: Fair  Recall:  NA  Fund of Knowledge:Fair  Language: Good  Akathisia:  Negative  Handed:  Right  AIMS (if indicated):  na  Assets:  Communication Skills Desire for Improvement Financial Resources/Insurance Housing Leisure Time Physical Health Social Support Transportation  ADL's:  Intact  Cognition: WNL  Sleep:  10 hours, napping as well    Treatment Plan Summary: Sheryl Lester is a 61 year old female with a history of bipolar disorder, most recent episode depressed, and medical contributors  including trigeminal neuralgia, obstructive sleep apnea, chronic pain, and multiple additional medical problems. Recent stressors include the loss of her mother in January 2018 from a recurrence of small cell cancer. She shares that she and her mother were quite close, and she has felt the grief this loss quite heavily.   However the patient has had largely unimproved depressive symptoms for the past 8-10 years, and this is been complicated by some characterologic or personality disordered features as well. She presents today with ongoing depressive symptoms, and we reviewed her lengthy previous medication trials, which includes multiple AEDs, multiple antidepressants, multiple atypical antipsychotics and benzodiazepines. I believe she would be appropriate for consultation with our ECT expert Dr. Weber Cooks, and the patient has agreed to give his clinical call to set up an initial consultation. If the patient is deemed an appropriate candidate, I'm happy to continue following for medication management, and to taper any medications that may impede the successful administration of ECT.  1. 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

## 2016-11-23 ENCOUNTER — Ambulatory Visit: Payer: 59 | Admitting: Psychology

## 2016-11-26 ENCOUNTER — Ambulatory Visit (INDEPENDENT_AMBULATORY_CARE_PROVIDER_SITE_OTHER): Payer: 59 | Admitting: Psychology

## 2016-11-26 DIAGNOSIS — F319 Bipolar disorder, unspecified: Secondary | ICD-10-CM | POA: Diagnosis not present

## 2016-11-28 ENCOUNTER — Telehealth (HOSPITAL_COMMUNITY): Payer: Self-pay

## 2016-11-28 NOTE — Telephone Encounter (Signed)
Patient is calling for a refill on her Pristiq, she said that you did not fill any medications at the last visit and she only has a few days left. Please review and advise, thank you

## 2016-11-29 MED ORDER — DESVENLAFAXINE SUCCINATE ER 100 MG PO TB24: 100.0000 mg | ORAL_TABLET | Freq: Every day | ORAL | 1 refills | Status: DC

## 2016-11-29 NOTE — Telephone Encounter (Signed)
All set. Sent to pharmacy

## 2016-12-04 ENCOUNTER — Ambulatory Visit (INDEPENDENT_AMBULATORY_CARE_PROVIDER_SITE_OTHER): Payer: 59 | Admitting: Psychiatry

## 2016-12-04 ENCOUNTER — Encounter: Payer: Self-pay | Admitting: Psychiatry

## 2016-12-04 VITALS — BP 131/82 | HR 72 | Temp 98.5°F | Wt 148.0 lb

## 2016-12-04 DIAGNOSIS — F3181 Bipolar II disorder: Secondary | ICD-10-CM | POA: Diagnosis not present

## 2016-12-04 DIAGNOSIS — F603 Borderline personality disorder: Secondary | ICD-10-CM

## 2016-12-04 NOTE — Progress Notes (Signed)
ECT: This is an ECT consult for this 61 year old woman referred by her outpatient psychiatrist. Patient was on time for interview and cooperative and forthcoming. She reports that she has had a long history of depression going back many years virtually lifelong. She has been diagnosed with bipolar disorder although her depression is by far the more time consuming part of her illness. The "manic" part of her illness is described as spells of anger and irritability usually directed at her family. Patient's mood now is mostly described as worried and anxious. She feels like she is overwhelmed by many stresses in her life. Her mother died in 2022/10/24 and she and her sisters are trying to take care of her father now. Patient is also worried about her relationship with her husband and worried about her husband's health. Patient reports that she has some chronic difficulty sleeping especially falling asleep and often takes medication to help her. Appetite is normal. She denies having any suicidal thoughts or wish to die. Denies any hallucinations currently but says that when she goes to her father's house she will at times see what she thinks are her own cats. She has no delusions about this and has insight that this is not real. Denies any homicidal thoughts. Patient is currently taking Adderall and Pristiq as her primary psychiatric medicines although she takes long-acting carbamazepine as part of her treatment for trigeminal neuralgia with an expectation that it may also assist with her mood. Patient just recently changed to a new psychiatrist and is in the process of changing to a new therapist. She had been seeing other providers but had to change when her insurance changed.  Patient lives with her husband. Husband works full-time but is not able to drive and the patient is responsible for driving him to and from work daily. As noted above the patient is responsible now for taking care of her father and finds this  time consuming and stressful. She is not currently working outside the home.  Patient has trigeminal neuralgia on the right side for which she has had surgery with partial improvement. She has chronic pain for which she takes medication.  Patient denies regular alcohol use and denies any history of alcohol abuse or other substance abuse and there is nothing in the chart to suggest otherwise.  Patient reports 2 previous psychiatric hospitalizations one of him in September 2017 another one several years ago. She had had suicidal thoughts at that time but had not acted on it. She reports multiple medications that she has taken over time including Cymbalta, Prozac, Effexor all with only partial benefit. She says she is been on her current antidepressant about 3 years. The only medicine she has taken other than her carbamazepine that could be a "mood stabilizer" was a trial of Abilify which reportedly caused a rash. Patient denies any history of frankly psychotic mania. No history of violence.  Patient is a neatly dressed and groomed woman. Appropriate interaction. Good eye contact. Affect was upbeat for the most part reactive and generally appropriate. Did not seem to be depressed or flat. Psychomotor activity was normal. Speech normal rate tone and volume. Mood was stated as being mostly nervous and worried. Thoughts were quite lucid no sign of loosening of associations delusions or bizarre thinking. Denied any current hallucinations. As far as suicidality she says that she thinks if she didn't have any responsibilities to anyone in the world she might have suicidal thoughts but as it is she is certain she  would never try to harm herself. No homicidal ideation. Short and long-term memory intact judgment and insight appear to be appropriate.  Assessment: This is a 61 year old woman with long-standing problems with mood and anxiety. Right now she is not describing a full major depressive syndrome. Her mood  currently is not consistently down and depressed but is more characterized by anxiety. Her affect did not appear to be depressed in interaction. She has been given a diagnosis of borderline personality disorder in the past which would fit with her description of her symptoms. I discussed with the patient the use of ECT for treating mood symptoms. Some difficulties that I pointed out are that right now she does not have a single clear syndrome that we would be trying to improve and that given her history of mood swings it is likely that she would have a high likelihood of going back into depression or irritability once the treatment stops. Patient feels that the side effects of memory impairment would be too disabling for her current responsibilities. Furthermore she is concerned about the possibility of having to stop her carbamazepine because of the chance that her pain would get worse. All in all the patient and I agreed that ECT was probably not the best solution for her symptoms at this point. Patient is aware that she could recontact me in the future if needed but at this point it appears to be impractical and the likely benefit not worth the risk. We did discuss other possibilities of medication and magnetic stimulation and I encouraged her to review this with her current provider. No further treatment from [place] no change to medicine. No further appointment.

## 2016-12-17 ENCOUNTER — Ambulatory Visit (INDEPENDENT_AMBULATORY_CARE_PROVIDER_SITE_OTHER): Payer: 59 | Admitting: Psychology

## 2016-12-17 DIAGNOSIS — F319 Bipolar disorder, unspecified: Secondary | ICD-10-CM

## 2016-12-18 ENCOUNTER — Ambulatory Visit: Payer: Self-pay | Admitting: Internal Medicine

## 2016-12-19 ENCOUNTER — Other Ambulatory Visit (HOSPITAL_COMMUNITY): Payer: Self-pay | Admitting: Psychiatry

## 2016-12-31 ENCOUNTER — Ambulatory Visit (INDEPENDENT_AMBULATORY_CARE_PROVIDER_SITE_OTHER): Payer: 59 | Admitting: Psychology

## 2016-12-31 DIAGNOSIS — F319 Bipolar disorder, unspecified: Secondary | ICD-10-CM

## 2017-01-14 ENCOUNTER — Telehealth (HOSPITAL_COMMUNITY): Payer: Self-pay

## 2017-01-14 ENCOUNTER — Ambulatory Visit (HOSPITAL_COMMUNITY): Payer: Self-pay | Admitting: Psychiatry

## 2017-01-14 NOTE — Telephone Encounter (Signed)
Medication management = Patient came in today after did not get the message her appt. today had to be rescheduled and is now set for 02/26/17. Pt. wanted to discuss Dr. Daron Offer taking over Adderall prescription from a previous provider and requests a call back from Dr. Daron Offer to discuss.  States she takes this to help her "get going" in the mornings and has now been out 5 days.  States she did meet with Dr. Weber Cooks to discuss ECT and that they agreed she was not a candidate for this so now patient wants to discuss [place] that was brought up previously by Dr. Daron Offer.  Agreed to discuss with Dr. Daron Offer her request for call back and to see if could get in earlier if possible.  Patient provided cell number [phone] for call back as states will be in [place] the next few days.

## 2017-01-15 NOTE — Telephone Encounter (Signed)
I attempted to call her back but there was no answer on either number. I left a voicemail. It appears that about 2 weeks after seeing me, she received a prescription (written on 4/12) from Dr. Albertine Patricia at [facility], for a stimulant, which is not acceptable.  It appears she is being managed by 2 psychiatrists. I have let her know that she should continue to see Dr. Albertine Patricia for med management  and is welcome to schedule with [place] for a [place] consult

## 2017-01-16 ENCOUNTER — Ambulatory Visit (INDEPENDENT_AMBULATORY_CARE_PROVIDER_SITE_OTHER): Payer: 59 | Admitting: Psychology

## 2017-01-16 DIAGNOSIS — F319 Bipolar disorder, unspecified: Secondary | ICD-10-CM

## 2017-01-24 ENCOUNTER — Telehealth (HOSPITAL_COMMUNITY): Payer: Self-pay | Admitting: Psychiatry

## 2017-01-25 NOTE — Telephone Encounter (Signed)
She has been receiving this from a different psychiatrist after seeing me.  I did not start this medicine and will not be prescribing it.  If she wants to continue with her prior psychiatrist she is welcome to [place] o

## 2017-01-28 ENCOUNTER — Telehealth (HOSPITAL_COMMUNITY): Payer: Self-pay | Admitting: Psychiatry

## 2017-01-28 NOTE — Telephone Encounter (Signed)
I talked to her on the phone - please add her to my schedule for tomorrow at 9 AM.  She is aware to come tomorrow at that time. Thank you!

## 2017-01-29 ENCOUNTER — Ambulatory Visit (INDEPENDENT_AMBULATORY_CARE_PROVIDER_SITE_OTHER): Payer: 59 | Admitting: Psychiatry

## 2017-01-29 ENCOUNTER — Encounter (HOSPITAL_COMMUNITY): Payer: Self-pay | Admitting: Psychiatry

## 2017-01-29 VITALS — BP 124/76 | HR 62 | Ht 67.0 in | Wt 143.0 lb

## 2017-01-29 DIAGNOSIS — Z818 Family history of other mental and behavioral disorders: Secondary | ICD-10-CM

## 2017-01-29 DIAGNOSIS — F4312 Post-traumatic stress disorder, chronic: Secondary | ICD-10-CM | POA: Diagnosis not present

## 2017-01-29 DIAGNOSIS — F603 Borderline personality disorder: Secondary | ICD-10-CM

## 2017-01-29 MED ORDER — CLONAZEPAM 1 MG PO TABS: ORAL_TABLET | ORAL | 0 refills | Status: DC

## 2017-01-29 MED ORDER — PRAZOSIN HCL 2 MG PO CAPS: 2.0000 mg | ORAL_CAPSULE | Freq: Every day | ORAL | 1 refills | Status: DC

## 2017-01-29 MED ORDER — BREXPIPRAZOLE 2 MG PO TABS: 2.0000 mg | ORAL_TABLET | Freq: Every day | ORAL | 1 refills | Status: DC

## 2017-01-29 NOTE — Patient Instructions (Signed)
  Continue Clonazepam 2 mg nightly for sleep and anxiety   Decrease the morning Clonazepam to 0.5 mg for 4 weeks, then discontinue   START Prazosin 2 mg at night for nightmares   START Rexulti starter pack, and increase to the 2 mg daily dose   Continue Pristiq 100 mg daily

## 2017-01-29 NOTE — Progress Notes (Signed)
[facility] MD/PA/NP OP Progress Note  01/29/2017 9:59 AM Sheryl Lester  MRN:  [id number]  Chief Complaint:  anxiety, up and down mood  Subjective:  Sheryl Lester presents today for medication management follow-up sooner than scheduled. She reports that she has been struggling with up-and-down mood, irritability, anxiety, increased associations.  I spent time discussing that she should not start stimulants that she had been given by other providers, and she needs to be discussing this with Probation officer.  She has not been on Adderall for the past 2 weeks as she ran out, and I was clear with her that I will not be prescribing this medication, and do not feel that she needs to be on this.  Her concern is related to being tired during the mornings. We discussed that her current medication regimen of clonazepam and Butrans would be much more likely to be contributing to sedation, and I would like to begin the tapering of clonazepam. She was agreeable to this. We discussed decreasing to 2 mg at night and 0.5 mg in the morning for 4 weeks, then discontinuing the morning dose altogether.  With regard to mood lability and depressive symptoms, we agreed to initiate Rexulti 1 mg for 3 days, then increase to 2 mg. Discussed the mechanism of action and expected results with this. Reviewed the risks and benefits, and she was agreeable to a trial of this medication. We also agreed to start prazosin at night given that she has nightmares almost every night.  She continues to use her BiPAP at night for sleep apnea.  I spent time discussing the criteria for borderline personality with the patient, and reviewed the specific criteria. She fills 7 out of the 9 criteria listed, and agrees with the diagnosis of borderline personality and chronic PTSD. I expressed to her that I have a low suspicion of bipolar affective illness, but we often use similar medications to treat the mood lability of borderline personality.    She is  agreeable to engage in group therapies in this clinic, and is agreeable to receive a referral for the IOP program.  She continues to engage in individual therapy with Dr. Cheryln Manly.  Visit Diagnosis:    ICD-9-CM ICD-10-CM   1. Borderline personality disorder 301.83 F60.3 Brexpiprazole (REXULTI) 2 MG TABS     prazosin (MINIPRESS) 2 MG capsule  2. Chronic post-traumatic stress disorder (PTSD) 309.81 F43.12 Brexpiprazole (REXULTI) 2 MG TABS     prazosin (MINIPRESS) 2 MG capsule     clonazePAM (KLONOPIN) 1 MG tablet    Past Psychiatric History: See intake H&P for full details. Reviewed, with no updates at this time.   Past Medical History:  Past Medical History:  Diagnosis Date  . ALLERGIC RHINITIS 01/20/2008  . ANEMIA-IRON DEFICIENCY 01/20/2008  . ANXIETY 01/20/2008  . ASTHMATIC BRONCHITIS, ACUTE 05/19/2008  . Bipolar disorder ([place])   . CENTRAL SLEEP APNEA CONDS CLASSIFIED ELSEWHERE 08/31/2010  . Complication of anesthesia 1987   "w/one of the anesthesia agents that they don't use anymore; chest muscles constricted; felt like I couldn't breath"  . DEPRESSION 01/20/2008  . DYSPHAGIA UNSPECIFIED 12/06/2008  . ELEVATED BLOOD PRESSURE WITHOUT DIAGNOSIS OF HYPERTENSION 12/06/2008  . GERD   . History of sleep walking   . HYPERLIPIDEMIA 01/20/2008  . HYPERTENSION 12/16/2008  . Melanoma ([place]) 2012   "skin of my stomach"  . Migraine    "related to menses; went away after hysterectomy"  . OSA treated with BiPAP   . OTITIS MEDIA,  ACUTE, BILATERAL 12/16/2008  . OTITIS MEDIA, SEROUS, CHRONIC 12/30/2008  . PVD 05/12/2010  . Pyelonephritis "hospitalized in ~ 1991"  . SINUSITIS- ACUTE-NOS 09/21/2008  . SWELLING MASS OR LUMP IN HEAD AND NECK 09/21/2008  . THYROID NODULE, LEFT    "unable to aspirate; has gotten very small"   . Trigeminal neuralgia 01/20/2008  . Unspecified hearing loss 09/21/2008  . VENOUS INSUFFICIENCY, LEGS 05/03/2010    Past Surgical History:  Procedure Laterality Date  . ABDOMINAL  HYSTERECTOMY  2000  . APPENDECTOMY  2000  . BRAIN SURGERY    . BREAST BIOPSY Left 1994   Benign  . 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 CHOLECYSTECTOMY N/A 12/13/2015   Procedure: LAPAROSCOPIC CHOLECYSTECTOMY;  Surgeon: Mickeal Skinner, MD;  Location: [facility];  Service: General;  Laterality: N/A;  . INGUINAL HERNIA REPAIR Right 1987  . MELANOMA EXCISION  2015   "skin of my stomach"  . OOPHORECTOMY Bilateral 2000    Family Psychiatric History: See intake H&P for full details. Reviewed, with no updates at this time.   Family History:  Family History  Problem Relation Age of Onset  . Hyperlipidemia Mother   . Hypertension Mother   . Diabetes Father   . Melanoma Father   . Anxiety disorder Father   . Depression Father   . Melanoma Sister   . Anxiety disorder Sister   . Depression Sister   . Heart disease Paternal Grandmother   . Anxiety disorder Brother   . Depression Brother   . Breast cancer Paternal Aunt   . Heart disease Other        Mother side of family-several uncles   . Anxiety disorder Sister   . Depression Sister   . Colon cancer Neg Hx     Social History:  Social History   Social History  . Marital status: Married    Spouse name: N/A  . Number of children: 0  . Years of education: N/A   Occupational History  . disabled Unemployed   Social History Main Topics  . Smoking status: Never Smoker  . Smokeless tobacco: Never Used  . Alcohol use No  . Drug use: No  . Sexual activity: Not Currently   Other Topics Concern  . None   Social History Narrative  . None    Allergies:  Allergies  Allergen Reactions  . Aripiprazole Rash and Swelling  . Fentanyl     Other reaction(s): Other Sleep Walking  . Avapro [Irbesartan] Other (See Comments)    hyponatremia  . Sulfamethoxazole-Trimethoprim Nausea And Vomiting  . Bupropion Rash  . Ibuprofen Nausea And Vomiting    Metabolic Disorder Labs: Lab Results   Component Value Date   HGBA1C 5.6 09/16/2015   No results found for: PROLACTIN Lab Results  Component Value Date   CHOL 205 (H) 09/16/2015   TRIG 91.0 09/16/2015   HDL 86.00 09/16/2015   CHOLHDL 2 09/16/2015   VLDL 18.2 09/16/2015   LDLCALC 101 (H) 09/16/2015   LDLCALC 71 06/02/2012     Current Medications: Current Outpatient Prescriptions  Medication Sig Dispense Refill  . aspirin 81 MG EC tablet Take 81 mg by mouth every morning.     . Buprenorphine (BUTRANS) 20 MCG/HR PTWK Place 20 mcg onto the skin every Tuesday.     Marland Kitchen BUTRANS 10 MCG/HR PTWK patch Take 10 mcg by mouth daily as needed (for pain).   2  . clonazePAM (KLONOPIN) 1  MG tablet Use 2 mg at night at 0.5 mg in the morning 120 tablet 0  . desvenlafaxine (PRISTIQ) 100 MG 24 hr tablet Take 1 tablet (100 mg total) by mouth daily. 90 tablet 1  . EQUETRO 300 MG CP12 Take 600 mg by mouth 2 (two) times daily.   11  . metoprolol (LOPRESSOR) 100 MG tablet Take 100 mg by mouth 2 (two) times daily.    . NON FORMULARY at bedtime. BI-Pap    . NONFORMULARY OR COMPOUNDED ITEM Take 1 application by mouth 4 (four) times daily as needed. Topically on cheek and inside mouth of mouth to calm trigeminal nerve    . NUCYNTA 100 MG TABS Take 250 mg by mouth 3 (three) times daily.    . Brexpiprazole (REXULTI) 2 MG TABS Take 2 mg by mouth daily. 90 tablet 1  . prazosin (MINIPRESS) 2 MG capsule Take 1 capsule (2 mg total) by mouth at bedtime. 90 capsule 1   No current facility-administered medications for this visit.     Neurologic: Headache: Negative Seizure: Negative Paresthesias: Yes  Musculoskeletal: Strength & Muscle Tone: within normal limits Gait & Station: normal Patient leans: N/A  Psychiatric Specialty Exam: ROS  Blood pressure 124/76, pulse 62, height 5\' 7"  (1.702 m), weight 143 lb (64.9 kg).Body mass index is 22.4 kg/m.  General Appearance: Casual and Fairly Groomed  Eye Contact:  Good  Speech:  Clear and Coherent   Volume:  Normal  Mood:  Dysphoric  Affect:  Congruent  Thought Process:  Goal Directed  Orientation:  Full (Time, Place, and Person)  Thought Content: Logical   Suicidal Thoughts:  No  Homicidal Thoughts:  No  Memory:  Immediate;   Good  Judgement:  Fair  Insight:  Shallow  Psychomotor Activity:  Normal  Concentration:  Concentration: Fair  Recall:  [place] of Knowledge: Fair  Language: Good  Akathisia:  Negative  Handed:  Right  AIMS (if indicated):  0  Assets:  Communication Skills Desire for Improvement Financial Resources/Insurance Housing Intimacy Leisure Time Social Support Talents/Skills Transportation  ADL's:  Intact  Cognition: WNL  Sleep:  5-6 hours, nightmares    Treatment Plan Summary: CALISSA SWENOR is a 61 year old female with a history of chronic PTSD and borderline personality disorder. She presents for medication management follow-up. Today was limit setting with the patient, that she should not receive any other prescriptions from any other psychiatric providers.  We spent time discussing the diagnosis of chronic PTSD, and its relationship with her diagnosis of borderline personality.  She reports that she has been told that she has this by multiple providers, and she knows that she should engage in therapy. She was agreeable to a referral for the IOP program, and she will continue to engage in individual therapy with Dr. Cheryln Manly.  She denies any acute suicidality, but does continue to struggle with mood lability.  We agreed to initiate therapies as below, and to start tapering her off of clonazepam.  1. Borderline personality disorder   2. Chronic post-traumatic stress disorder (PTSD)    Continue Pristiq 100 mg daily Taper clonazepam from 1 mg in the morning to 0.5 mg in the morning for 4 weeks then discontinue Continue clonazepam 2 mg nightly for now Initiate prazosin 2 mg nightly for sleep and nightmares Initiate Rexulti 1 mg 3 days, then  increase to 2 mg daily lability and augmenting antidepressant Patient has previously been tried on aripiprazole 5 mg in the past, but  had akathisia, increased anxiety, restlessness Return to clinic in 1 month Referral to IOP program Continue in therapy with Dr. Caryl Ada, MD 01/29/2017, 9:59 AM

## 2017-01-30 ENCOUNTER — Ambulatory Visit (INDEPENDENT_AMBULATORY_CARE_PROVIDER_SITE_OTHER): Payer: 59 | Admitting: Psychology

## 2017-01-30 DIAGNOSIS — F319 Bipolar disorder, unspecified: Secondary | ICD-10-CM | POA: Diagnosis not present

## 2017-01-31 ENCOUNTER — Telehealth (HOSPITAL_COMMUNITY): Payer: Self-pay

## 2017-01-31 NOTE — Telephone Encounter (Signed)
A:  Placed call to pt to discuss MH-IOP.  Pt states she would like to call her insurance co first, before making a decision.  Left phone number with pt.  Informed Dr. Daron Offer.

## 2017-01-31 NOTE — Telephone Encounter (Signed)
Medication management - Fax received from patient's pharmacy requesting an alternative to [place] due to cost and would like another option. Spoke to pt. and pharmacy to verify this request due to medication cost.  Patient reported at minimal even if her insurance covers the bulk of the medication cost it would be more than $50 a month and this would be too much for her and her husband due to the number of medications they have to fill each month.  Patient requested to see if Dr. Daron Offer has any coupons for the medication or to change to something as an alternative.

## 2017-01-31 NOTE — Telephone Encounter (Signed)
All set, I called her and left it at front desk

## 2017-02-13 ENCOUNTER — Ambulatory Visit (INDEPENDENT_AMBULATORY_CARE_PROVIDER_SITE_OTHER): Payer: 59 | Admitting: Psychology

## 2017-02-13 DIAGNOSIS — F319 Bipolar disorder, unspecified: Secondary | ICD-10-CM

## 2017-02-18 ENCOUNTER — Encounter (HOSPITAL_COMMUNITY): Payer: Self-pay | Admitting: Psychiatry

## 2017-02-18 ENCOUNTER — Other Ambulatory Visit (HOSPITAL_COMMUNITY): Payer: 59 | Attending: Psychiatry | Admitting: Psychiatry

## 2017-02-18 DIAGNOSIS — I739 Peripheral vascular disease, unspecified: Secondary | ICD-10-CM | POA: Diagnosis not present

## 2017-02-18 DIAGNOSIS — I10 Essential (primary) hypertension: Secondary | ICD-10-CM | POA: Insufficient documentation

## 2017-02-18 DIAGNOSIS — F4312 Post-traumatic stress disorder, chronic: Secondary | ICD-10-CM | POA: Diagnosis present

## 2017-02-18 DIAGNOSIS — F603 Borderline personality disorder: Secondary | ICD-10-CM | POA: Insufficient documentation

## 2017-02-18 DIAGNOSIS — F418 Other specified anxiety disorders: Secondary | ICD-10-CM | POA: Diagnosis not present

## 2017-02-18 DIAGNOSIS — F3181 Bipolar II disorder: Secondary | ICD-10-CM | POA: Diagnosis not present

## 2017-02-18 DIAGNOSIS — J45909 Unspecified asthma, uncomplicated: Secondary | ICD-10-CM | POA: Diagnosis not present

## 2017-02-18 DIAGNOSIS — Z85820 Personal history of malignant melanoma of skin: Secondary | ICD-10-CM | POA: Diagnosis not present

## 2017-02-18 DIAGNOSIS — G500 Trigeminal neuralgia: Secondary | ICD-10-CM | POA: Diagnosis not present

## 2017-02-18 DIAGNOSIS — H9190 Unspecified hearing loss, unspecified ear: Secondary | ICD-10-CM | POA: Diagnosis not present

## 2017-02-18 DIAGNOSIS — G4733 Obstructive sleep apnea (adult) (pediatric): Secondary | ICD-10-CM | POA: Diagnosis not present

## 2017-02-18 DIAGNOSIS — E785 Hyperlipidemia, unspecified: Secondary | ICD-10-CM | POA: Diagnosis not present

## 2017-02-18 DIAGNOSIS — I872 Venous insufficiency (chronic) (peripheral): Secondary | ICD-10-CM | POA: Insufficient documentation

## 2017-02-18 NOTE — Progress Notes (Signed)
Psychiatric Initial Adult Assessment   Patient Identification: Sheryl Lester MRN:  [id number] Date of Evaluation:  02/18/2017 Referral Source: self Chief Complaint:   Chief Complaint    Depression; Anxiety; Stress    depressed Visit Diagnosis:    ICD-10-CM   1. Borderline personality disorder F60.3   2. Chronic post-traumatic stress disorder (PTSD) F43.12   3. Bipolar 2 disorder (HCC) F31.81    History of Present Illness:  Sheryl Lester is a 61 year old female with Borderline personality disorder who presents today for a IOP assessment. She is followed by this Probation officer for outpatient psychiatric care.  She has ongoing periods of irritability, mood dysregulation, depressed mood, feelings of worthlessness and guilt, and interpersonal difficulties.  She struggles with chronic suicidal thoughts and has had a history of psychiatric hospitalization.    The patient and I spent time reviewing her current medication regimen, specifically Rexulti which has been titrated to 2 mg.  she is consolidated clonazepam to 2 mg at night, and wishes to continue tapering this.  She continues on Pristiq 100 mg daily.  She is hopeful that the IOP will help with some of her poor coping strategies. She understands that she needs to learn improved communication skills, and learn to cope with some of her interpersonal stressors.  She denies any acute SI or intent to harm herself.  Denies HI, AVH.  Continues with paranoia and interpersonal distrust towards others. She had a "blow up" towards her husband this weekend, because she felt like he did not want her to go on a vacation with him, despite his clear communication towards her that he did want her to go. She admits that this fear of abandonment continues to drive much of her behavioral acting out.  Associated Signs/Symptoms: Depression Symptoms:  depressed mood, anhedonia, hypersomnia, psychomotor retardation, fatigue, feelings of  worthlessness/guilt, difficulty concentrating, recurrent thoughts of death, anxiety, (Hypo) Manic Symptoms:  Impulsivity, Irritable Mood, Labiality of Mood, Anxiety Symptoms:  Excessive Worry, Psychotic Symptoms:  none PTSD Symptoms: Negative  Past Psychiatric History: 2 prior psychiatric hospitalizations, long-standing history of outpatient psychiatric treatment  Previous Psychotropic Medications: Yes   Substance Abuse History in the last 12 months:  No.  Consequences of Substance Abuse: Negative  Past Medical History:  Past Medical History:  Diagnosis Date  . ALLERGIC RHINITIS 01/20/2008  . ANEMIA-IRON DEFICIENCY 01/20/2008  . ANXIETY 01/20/2008  . ASTHMATIC BRONCHITIS, ACUTE 05/19/2008  . Bipolar disorder ([place])   . CENTRAL SLEEP APNEA CONDS CLASSIFIED ELSEWHERE 08/31/2010  . Complication of anesthesia 1987   "w/one of the anesthesia agents that they don't use anymore; chest muscles constricted; felt like I couldn't breath"  . DEPRESSION 01/20/2008  . DYSPHAGIA UNSPECIFIED 12/06/2008  . ELEVATED BLOOD PRESSURE WITHOUT DIAGNOSIS OF HYPERTENSION 12/06/2008  . GERD   . History of sleep walking   . HYPERLIPIDEMIA 01/20/2008  . HYPERTENSION 12/16/2008  . Melanoma ([place]) 2012   "skin of my stomach"  . Migraine    "related to menses; went away after hysterectomy"  . OSA treated with BiPAP   . OTITIS MEDIA, ACUTE, BILATERAL 12/16/2008  . OTITIS MEDIA, SEROUS, CHRONIC 12/30/2008  . PVD 05/12/2010  . Pyelonephritis "hospitalized in ~ 1991"  . SINUSITIS- ACUTE-NOS 09/21/2008  . SWELLING MASS OR LUMP IN HEAD AND NECK 09/21/2008  . THYROID NODULE, LEFT    "unable to aspirate; has gotten very small"   . Trigeminal neuralgia 01/20/2008  . Unspecified hearing loss 09/21/2008  . VENOUS INSUFFICIENCY, LEGS 05/03/2010  Past Surgical History:  Procedure Laterality Date  . ABDOMINAL HYSTERECTOMY  2000  . APPENDECTOMY  2000  . BRAIN SURGERY    . BREAST BIOPSY Left 1994   Benign  . CEREBRAL  MICROVASCULAR DECOMPRESSION  1999   for chronic facial pain/ [facility]  . CHOLECYSTECTOMY N/A 12/13/2015   Procedure: LAPAROSCOPIC CHOLECYSTECTOMY;  Surgeon: Mickeal Skinner, MD;  Location: [facility];  Service: General;  Laterality: N/A;  . INGUINAL HERNIA REPAIR Right 1987  . MELANOMA EXCISION  2015   "skin of my stomach"  . OOPHORECTOMY Bilateral 2000    Family Psychiatric History: Family psychiatric history of Parkinson's disease, depression  Family History:  Family History  Problem Relation Age of Onset  . Hyperlipidemia Mother   . Hypertension Mother   . Diabetes Father   . Melanoma Father   . Anxiety disorder Father   . Depression Father   . Melanoma Sister   . Anxiety disorder Sister   . Depression Sister   . Heart disease Paternal Grandmother   . Anxiety disorder Brother   . Depression Brother   . Breast cancer Paternal Aunt   . Heart disease Other        Mother side of family-several uncles   . Anxiety disorder Sister   . Depression Sister   . Colon cancer Neg Hx     Social History:   Social History   Social History  . Marital status: Married    Spouse name: N/A  . Number of children: 0  . Years of education: N/A   Occupational History  . disabled Unemployed   Social History Main Topics  . Smoking status: Never Smoker  . Smokeless tobacco: Never Used  . Alcohol use No  . Drug use: No  . Sexual activity: Not Currently   Other Topics Concern  . None   Social History Narrative  . None    Additional Social History: Currently married. No children. Not working  Allergies:   Allergies  Allergen Reactions  . Aripiprazole Rash and Swelling  . Fentanyl     Other reaction(s): Other Sleep Walking  . Avapro [Irbesartan] Other (See Comments)    hyponatremia  . Sulfamethoxazole-Trimethoprim Nausea And Vomiting  . Bupropion Rash  . Ibuprofen Nausea And Vomiting    Metabolic Disorder Labs: Lab Results  Component Value Date   HGBA1C 5.6 09/16/2015    No results found for: PROLACTIN Lab Results  Component Value Date   CHOL 205 (H) 09/16/2015   TRIG 91.0 09/16/2015   HDL 86.00 09/16/2015   CHOLHDL 2 09/16/2015   VLDL 18.2 09/16/2015   LDLCALC 101 (H) 09/16/2015   LDLCALC 71 06/02/2012     Current Medications: Current Outpatient Prescriptions  Medication Sig Dispense Refill  . aspirin 81 MG EC tablet Take 81 mg by mouth every morning.     . Brexpiprazole (REXULTI) 2 MG TABS Take 2 mg by mouth daily. 90 tablet 1  . Buprenorphine (BUTRANS) 20 MCG/HR PTWK Place 20 mcg onto the skin every Tuesday.     Marland Kitchen BUTRANS 10 MCG/HR PTWK patch Take 10 mcg by mouth daily as needed (for pain).   2  . clonazePAM (KLONOPIN) 1 MG tablet Use 2 mg at night at 0.5 mg in the morning 120 tablet 0  . desvenlafaxine (PRISTIQ) 100 MG 24 hr tablet Take 1 tablet (100 mg total) by mouth daily. 90 tablet 1  . EQUETRO 300 MG CP12 Take 600 mg by mouth 2 (two) times daily.  11  . metoprolol (LOPRESSOR) 100 MG tablet Take 100 mg by mouth 2 (two) times daily.    . NON FORMULARY at bedtime. BI-Pap    . NONFORMULARY OR COMPOUNDED ITEM Take 1 application by mouth 4 (four) times daily as needed. Topically on cheek and inside mouth of mouth to calm trigeminal nerve    . NUCYNTA 100 MG TABS Take 250 mg by mouth 3 (three) times daily.    . prazosin (MINIPRESS) 2 MG capsule Take 1 capsule (2 mg total) by mouth at bedtime. 90 capsule 1   No current facility-administered medications for this visit.     Neurologic: Headache: Yes Seizure: Negative Paresthesias:Yes  Musculoskeletal: Strength & Muscle Tone: within normal limits  Gait & Station: normal Patient leans: N/A  Psychiatric Specialty Exam: Review of Systems  Constitutional: Negative.   HENT: Negative.   Respiratory: Negative.   Cardiovascular: Negative.   Gastrointestinal: Negative.   Musculoskeletal: Positive for joint pain.  Neurological: Positive for sensory change.  Psychiatric/Behavioral: Positive  for depression and memory loss.    There were no vitals taken for this visit.There is no height or weight on file to calculate BMI.  General Appearance: Casual and Well Groomed  Eye Contact:  Good  Speech:  Clear and Coherent and Pressured  Volume:  Normal  Mood:  Euthymic and Hopeless  Affect:  Congruent, Constricted and Depressed  Thought Process:  Coherent  Orientation:  Full (Time, Place, and Person)  Thought Content:  Rumination  Suicidal Thoughts:  Yes.  without intent/plan  Homicidal Thoughts:  No  Memory:  Immediate;   Fair  Judgement:  Fair  Insight:  Shallow  Psychomotor Activity:  Normal  Concentration:  Concentration: Fair  Recall:  NA  Fund of Knowledge:Fair  Language: Good  Akathisia:  Negative  Handed:  Right  AIMS (if indicated):  na  Assets:  Communication Skills Desire for Improvement Financial Resources/Insurance Housing Leisure Time Physical Health Social Support Transportation  ADL's:  Intact  Cognition: WNL  Sleep:  7-8 hours with clonazepam 2 mg at night    Treatment Plan Summary: SHENNA BRISSETTE is a 60 year old female with borderline personality disorder and a history of bipolar disorder who presents today for IOP assessment. She has struggled with chronic mood lability and has a history of prior psychiatric hospitalizations, and prior suicide attempt.  She continues to struggle with mood lability and chaotic interpersonal relationships, and would benefit from IOP support as she continues to learn coping strategies and improved interpersonal communication.    1. Borderline personality disorder   2. Chronic post-traumatic stress disorder (PTSD)   3. Bipolar 2 disorder (HCC)    - Continue clonazepam 2 mg at night - Continue Pristiq 100 mg daily for mood, anxiety - Rexulti 2 mg daily for depression augmentation - Continue Tegretol ER [facility]) per neurology for Trigeminal Neuralgia - prazosin 2 mg qhs; patient uses this periodically - patient  is appropriate candidate for IOP participation - Follow-up with writer in 4 weeks  Aundra Dubin, MD 6/25/201811:40 AM

## 2017-02-18 NOTE — Progress Notes (Signed)
Comprehensive Clinical Assessment (CCA) Note  02/18/2017 Sheryl Lester [id number]  Visit Diagnosis:   No diagnosis found.    CCA Part One  Part One has been completed on paper by the patient.  (See scanned document in Chart Review)  CCA Part Two A  Intake/Chief Complaint:  CCA Intake With Chief Complaint CCA Part Two Date: 02/18/17 CCA Part Two Time: 1058 Chief Complaint/Presenting Problem: This is a 61 yr old, married, unemployed, Caucasian female who was referred per Dr. Daron Offer; treatment for worsening depressive and anxiety symptoms.  Denies SI/HI or A/V hallucinations or paranoia.  Multiple stressors:  1)  Medical Illness:  Central Apnea, HTN, Trigeninal Neuralgia.  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  Reports pain in her face all the time.  2)  Husband of 23 yrs is legally blind.  Pt has to drive him everywhere.  He is a Chief Executive Officer.  3)  Financial Strain  4) Unresolved grief/loss issues:  57 yr old nephew dx with Leukemia.  Is in need of a bone marrow transplant.  On 09/29/2016, mother died of cancer.  Father (retired Engineer, drilling) has [place] and is still in the home.  Pt goes to [place], [place] every Thurs-Sun to assist him.  Two prior psychiatric hospitalizatiions:  14 yrs ago [facility] and in Oct. 2017 [facility].  Has seen Dr. Apolonio Schneiders five times recently and currently with Dr. Daron Offer.  Denies any previous suicide attempts or gestures.  Family hx:  Father (depression)                                                       Patients Currently Reported Symptoms/Problems: Sadness, low self esteem, indecisiveness, irritable, anhedonia,  loss of motivation, poor appetite, poor sleep, anxious, tearful Collateral Involvement: Husband and closest sister in age are supportive. Individual's Strengths: motivated for treatment Type of Services Patient Feels Are Needed: MH-IOP  Mental Health Symptoms Depression:  Depression: Change in energy/activity, Difficulty Concentrating,  Increase/decrease in appetite, Irritability, Tearfulness, Sleep (too much or little)  Mania:  Mania: Change in energy/activity  Anxiety:   Anxiety: Worrying  Psychosis:  Psychosis: N/A  Trauma:  Trauma: N/A  Obsessions:  Obsessions: N/A  Compulsions:  Compulsions: N/A  Inattention:  Inattention: N/A  Hyperactivity/Impulsivity:  Hyperactivity/Impulsivity: N/A  Oppositional/Defiant Behaviors:  Oppositional/Defiant Behaviors: N/A  Borderline Personality:  Emotional Irregularity: N/A  Other Mood/Personality Symptoms:      Mental Status Exam Appearance and self-care  Stature:  Stature: Average  Weight:  Weight: Average weight  Clothing:  Clothing: Casual  Grooming:  Grooming: Normal  Cosmetic use:  Cosmetic Use: None  Posture/gait:  Posture/Gait: Normal  Motor activity:  Motor Activity: Not Remarkable  Sensorium  Attention:  Attention: Normal  Concentration:  Concentration: Normal  Orientation:  Orientation: X5  Recall/memory:  Recall/Memory: Normal  Affect and Mood  Affect:  Affect: Appropriate  Mood:  Mood: Depressed  Relating  Eye contact:  Eye Contact: Normal  Facial expression:  Facial Expression: Sad  Attitude toward examiner:  Attitude Toward Examiner: Cooperative  Thought and Language  Speech flow: Speech Flow: Normal  Thought content:  Thought Content: Appropriate to mood and circumstances  Preoccupation:     Hallucinations:     Organization:     Transport planner of Knowledge:  Fund of Knowledge: Average  Intelligence:  Intelligence: Average  Abstraction:  Abstraction: Normal  Judgement:  Judgement: Fair  Art therapist:  Reality Testing: Adequate  Insight:  Insight: Gaps  Decision Making:  Decision Making: Impulsive  Social Functioning  Social Maturity:  Social Maturity: Impulsive  Social Judgement:  Social Judgement: Normal  Stress  Stressors:  Stressors: Brewing technologist, Chiropodist, Illness  Coping Ability:  Coping Ability: English as a second language teacher Deficits:      Supports:      Family and Psychosocial History: Family history Marital status: Married Number of Years Married: 74 What is your sexual orientation?: heterosexual Does patient have children?: No  Childhood History:  Childhood History By whom was/is the patient raised?: Both parents Additional childhood history information: Born in [place], [place] at age 57 (father was in Nature conservation officer), moved to [place], after he finished residency moved to [place] so father could practice medicine.  Denies any abuse.  States she felt ignored and not taken seriously.   Middle school was teased about her weight.  Apparently had body dysmorphic issues.  In 20's was very strict with her diet and exercised a lot.                                    Does patient have siblings?: Yes Number of Siblings: 3 Description of patient's current relationship with siblings: Pt is the oldest. Did patient suffer any verbal/emotional/physical/sexual abuse as a child?: No Did patient suffer from severe childhood neglect?: No Has patient ever been sexually abused/assaulted/raped as an adolescent or adult?: No Was the patient ever a victim of a crime or a disaster?: No Witnessed domestic violence?: No Has patient been effected by domestic violence as an adult?: No  CCA Part Two B  Employment/Work Situation: Employment / Work Copywriter, advertising Employment situation: Unemployed Has patient ever been in the [employer]?: No Has patient ever served in Recruitment consultant?: No Did You Receive Any Psychiatric Treatment/Services While in Passenger transport manager?: No Are There Guns or Other Weapons in [place]?: No  Education: Education Did Teacher, adult education From [employer]?: Yes Did Physicist, medical?: Yes Did Heritage manager?: No 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, contracting Did You Have An Individualized Education Program (IIEP): No Did You Have Any Difficulty At [employer]?: No  Religion: Religion/Spirituality Are You A Religious Person?:  No  Leisure/Recreation: Leisure / Recreation Leisure and Hobbies: watching tv and walking  Exercise/Diet: Exercise/Diet Do You Exercise?: Yes What Type of Exercise Do You Do?: Run/Walk How Many Times a Week Do You Exercise?: 1-3 times a week Have You Gained or Lost A Significant Amount of Weight in the Past Six Months?: No Do You Follow a Special Diet?: No Do You Have Any Trouble Sleeping?: No  CCA Part Two C  Alcohol/Drug Use: Alcohol / Drug Use Pain Medications: Pt not taking her prescribed pain medications as prescribed Prescriptions: See MAR Over the Counter: See MAR History of alcohol / drug use?: Yes Longest period of sobriety (when/how long): NA                      CCA Part Three  ASAM's:  Six Dimensions of Multidimensional Assessment  Dimension 1:  Acute Intoxication and/or Withdrawal Potential:     Dimension 2:  Biomedical Conditions and Complications:     Dimension 3:  Emotional, Behavioral, or Cognitive Conditions and Complications:     Dimension  4:  Readiness to Change:     Dimension 5:  Relapse, Continued use, or Continued Problem Potential:     Dimension 6:  Recovery/Living Environment:      Substance use Disorder (SUD)    Social Function:  Social Functioning Social Maturity: Impulsive Social Judgement: Normal  Stress:  Stress Stressors: Grief/losses, Chiropodist, Illness Coping Ability: Overwhelmed Patient Takes Medications The Way The Doctor Instructed?: Yes Priority Risk: Moderate Risk  Risk Assessment- Self-Harm Potential: Risk Assessment For Self-Harm Potential Thoughts of Self-Harm: No current thoughts Method: No plan Availability of Means: No access/NA  Risk Assessment -Dangerous to Others Potential: Risk Assessment For Dangerous to Others Potential Method: No Plan Availability of Means: No access or NA Intent: Vague intent or NA Notification Required: No need or identified person  DSM5 Diagnoses: Patient Active Problem List    Diagnosis Date Noted  . Sleep apnea 11/20/2016  . Bipolar disorder, in partial remission, most recent episode depressed ([place]) 11/20/2016  . Bipolar 1 disorder, depressed, moderate ([place]) 05/27/2016  . Rash and nonspecific skin eruption 03/21/2016  . Encounter for therapeutic drug monitoring 03/02/2016  . Cholecystitis with cholelithiasis 12/12/2015  . 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 Chronic pain in face 03/25/2015  . 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 Migraine with aura, intractable 04/03/2013  . Dysesthesia 02/19/2013  . Osteopenia 12/23/2012  . Intractable migraine 11/14/2012  . Skin lesion 09/23/2012  . Hearing loss 09/23/2012  . 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 Atypical facial pain 05/30/2012  . Facial pain 05/29/2012  . Spells 11/23/2011  . Chronic interstitial cystitis 11/14/2011  . Parasomnia 10/01/2011  . Melanoma ([place]) 07/05/2011  . Preventative health care 03/01/2011  . PVD 05/12/2010  . HYPERTENSION 12/16/2008  . TIA 12/16/2008  . GERD 12/06/2008  . Unspecified hearing loss 09/21/2008  . HYPERLIPIDEMIA 01/20/2008  . ANEMIA-IRON DEFICIENCY 01/20/2008  . Trigeminal neuralgia 01/20/2008  . ALLERGIC RHINITIS 01/20/2008    Patient Centered Plan: Patient is on the following Treatment Plan(s):  Anxiety and Depression  Recommendations for Services/Supports/Treatments: Recommendations for Services/Supports/Treatments Recommendations For Services/Supports/Treatments: IOP (Intensive Outpatient Program)  Treatment Plan Summary:   Oriented pt.  Attend groups.  Encourage support groups.  F/U with Drs. Gutterman and [employer] Referrals to Alternative Service(s): Referred to Alternative Service(s):   Place:   Date:   Time:    Referred to Alternative Service(s):   Place:   Date:   Time:    Referred to Alternative Service(s):   Place:   Date:   Time:    Referred to Alternative Service(s):   Place:   Date:   Time:     CLARK, RITA, M.Ed, CNA

## 2017-02-18 NOTE — Patient Instructions (Addendum)
Decrease Clonazepam from 2 mg at night, to 1.5 mg at night for about 3-4 weeks, then check in with me to see how its going.  Continue Rexulti 2 mg daily  Continue Pristiq 100 mg daily  Continue Prazosin 2 mg if needed at bedtime

## 2017-02-19 ENCOUNTER — Other Ambulatory Visit (HOSPITAL_COMMUNITY): Payer: 59 | Admitting: Psychiatry

## 2017-02-19 DIAGNOSIS — F4312 Post-traumatic stress disorder, chronic: Secondary | ICD-10-CM

## 2017-02-19 DIAGNOSIS — F603 Borderline personality disorder: Secondary | ICD-10-CM | POA: Diagnosis not present

## 2017-02-20 ENCOUNTER — Other Ambulatory Visit (HOSPITAL_COMMUNITY): Payer: 59 | Admitting: Psychiatry

## 2017-02-20 DIAGNOSIS — F603 Borderline personality disorder: Secondary | ICD-10-CM | POA: Diagnosis not present

## 2017-02-20 DIAGNOSIS — F4312 Post-traumatic stress disorder, chronic: Secondary | ICD-10-CM

## 2017-02-21 ENCOUNTER — Other Ambulatory Visit (HOSPITAL_COMMUNITY): Payer: 59 | Admitting: Psychiatry

## 2017-02-21 DIAGNOSIS — F603 Borderline personality disorder: Secondary | ICD-10-CM | POA: Diagnosis not present

## 2017-02-21 DIAGNOSIS — F4312 Post-traumatic stress disorder, chronic: Secondary | ICD-10-CM

## 2017-02-22 ENCOUNTER — Other Ambulatory Visit (HOSPITAL_COMMUNITY): Payer: 59

## 2017-02-23 NOTE — Progress Notes (Signed)
    Daily Group Progress Note  Program: IOP  Group Time: 9:00-12:00  Participation Level: Active  Behavioral Response: Appropriate  Type of Therapy:  Group Therapy  Summary of Progress:  Pt. Presented as calm, smiled appropriately. Pt. Stated that she did not have a good evening and thought about not coming to group but she pushed herself to come. Pt. Discussed her family dynamics of a traditional [place] family and feeling that her brother was privileged and favored because he was female. Pt. Discussed never feeling as if she fit in and feeling hurt because she had the role of being "the craziest in my family". Pt shared recent political events were very difficult for her. Pt. Participated in discussion about local community mental health support services facilitated by Cristie Hem from the mental health association.     Nancie Neas, LPC

## 2017-02-23 NOTE — Progress Notes (Signed)
    Daily Group Progress Note  Program: IOP  Group Time: 9:00-12:00  Participation Level: Active  Behavioral Response: Appropriate  Type of Therapy:  Group Therapy  Summary of Progress: Pt. Continues to present as depressed, engaged in the group process. Pt. Shared that she has significant body pain and she has tremendous responsibilities as a caregiver for her elderly father and her husband who is legally blind. Pt.  Participated in discussion about how to recognize where your personal boundaries are and when they have been crossed, and recognizing your personal "NO".       Nancie Neas, LPC

## 2017-02-23 NOTE — Progress Notes (Signed)
    Daily Group Progress Note  Program: IOP  Group Time: 9:00-12:00  Participation Level: Active  Behavioral Response: Appropriate  Type of Therapy:  Group Therapy  Summary of Progress: Pt.'s first day of group and met with the case manager and the psychiatrist. Pt. Introduced herself to the group and stated that she has long history of chronic pain and depression. Pt. Participated in medication management group facilitated by the pharmacist.     Nancie Neas, [facility]

## 2017-02-23 NOTE — Progress Notes (Signed)
    Daily Group Progress Note  Program: IOP  Group Time: 9:00-12:00  Participation Level: Active  Behavioral Response: Appropriate  Type of Therapy:  Group Therapy  Summary of Progress: Pt. Presents as depressed, mostly quiet, but engaged in the group process. Pt. Shared that her life is burdened by a chaotic schedule of caregiving for her father and her husband. Pt. Discussed how rules regarding perfectionism have affected her all of her life. Pt. Discussed that she learned to "do it the right way, or not at all" and it was difficult for her to break out of this thought pattern. Pt. Participated in grief and loss group facilitated by the Chaplain. Pt. Participated in discussion of Mel Robbins video and using the "5 second" rule to resist perfectionism and procrastination.     Nancie Neas, LPC

## 2017-02-25 ENCOUNTER — Other Ambulatory Visit (HOSPITAL_COMMUNITY): Payer: 59 | Attending: Psychiatry | Admitting: Psychiatry

## 2017-02-25 DIAGNOSIS — F4312 Post-traumatic stress disorder, chronic: Secondary | ICD-10-CM | POA: Diagnosis present

## 2017-02-25 DIAGNOSIS — F419 Anxiety disorder, unspecified: Secondary | ICD-10-CM | POA: Insufficient documentation

## 2017-02-25 DIAGNOSIS — F329 Major depressive disorder, single episode, unspecified: Secondary | ICD-10-CM | POA: Insufficient documentation

## 2017-02-25 DIAGNOSIS — F603 Borderline personality disorder: Secondary | ICD-10-CM | POA: Insufficient documentation

## 2017-02-25 NOTE — Progress Notes (Signed)
    Daily Group Progress Note  Program: IOP  Group Time: 9"00-12:00  Participation Level: Active  Behavioral Response: Appropriate  Type of Therapy:  Group Therapy  Summary of Progress:  Pt. Presents as quiet, responds to prompts from the counselor and feedback from the rest of the group. Pt. Continues to be guided by rule "You don't get credit for what you are supposed to do". Counselor introduced concept of self-compassion, treating self with gentleness and using kind words toward yourself. Counselor discussed that her rule is inconsistent with self-compassion and that she should look at how her rule is no longer serving in her life. Pt. Shared with the group an incident in her life that she has felt regret and guilt about for many years and was challenged to exercise forgiveness for her former self. Participated in grief and loss group with the Chaplain.      Nancie Neas, LPC

## 2017-02-26 ENCOUNTER — Ambulatory Visit (HOSPITAL_COMMUNITY): Payer: Self-pay | Admitting: Psychiatry

## 2017-02-26 ENCOUNTER — Other Ambulatory Visit (HOSPITAL_COMMUNITY): Payer: 59 | Admitting: Psychiatry

## 2017-02-26 DIAGNOSIS — F603 Borderline personality disorder: Secondary | ICD-10-CM

## 2017-02-26 DIAGNOSIS — F329 Major depressive disorder, single episode, unspecified: Secondary | ICD-10-CM | POA: Diagnosis not present

## 2017-02-26 DIAGNOSIS — F4312 Post-traumatic stress disorder, chronic: Secondary | ICD-10-CM

## 2017-02-28 ENCOUNTER — Other Ambulatory Visit (HOSPITAL_COMMUNITY): Payer: 59 | Admitting: Psychiatry

## 2017-02-28 DIAGNOSIS — F603 Borderline personality disorder: Secondary | ICD-10-CM

## 2017-02-28 DIAGNOSIS — F4312 Post-traumatic stress disorder, chronic: Secondary | ICD-10-CM

## 2017-02-28 DIAGNOSIS — F329 Major depressive disorder, single episode, unspecified: Secondary | ICD-10-CM | POA: Diagnosis not present

## 2017-02-28 NOTE — Progress Notes (Signed)
    Daily Group Progress Note  Program: IOP  Group Time: 9:00-12:00  Participation Level: Active  Behavioral Response: Appropriate  Type of Therapy:  Group Therapy  Summary of Progress: Pt. Presented with significantly improved affect compared to previous session. Pt. Reported that she was able to spend quality time with her husband during the holiday and she was able to relax and enjoy it. Pt. Connected well with another group member regarding family dynamics. Pt. Participated in yoga therapy with Jan Fireman.     Nancie Neas, LPC

## 2017-02-28 NOTE — Progress Notes (Signed)
    Daily Group Progress Note  Program: IOP  Group Time: 9:00-12:00  Participation Level: Active  Behavioral Response: Appropriate  Type of Therapy:  Group Therapy  Summary of Progress: Pt. Continues to present with flat affect, depressed, complains of body pain. Pt. Discussed the guilt that she feels about not working. Pt. Continues to place tremendous demands on herself and often feels discouraged and guilty when she cannot meet those demands. Counselor continues to help Pt. To process need for self-compassion and forgiveness of self, but she is generally resistant to exercising compassion toward herself.     Nancie Neas, LPC

## 2017-03-01 ENCOUNTER — Ambulatory Visit: Payer: 59 | Admitting: Endocrinology

## 2017-03-04 ENCOUNTER — Other Ambulatory Visit (HOSPITAL_COMMUNITY): Payer: 59 | Admitting: Psychiatry

## 2017-03-04 DIAGNOSIS — F329 Major depressive disorder, single episode, unspecified: Secondary | ICD-10-CM | POA: Diagnosis not present

## 2017-03-04 DIAGNOSIS — F4312 Post-traumatic stress disorder, chronic: Secondary | ICD-10-CM

## 2017-03-04 DIAGNOSIS — F603 Borderline personality disorder: Secondary | ICD-10-CM

## 2017-03-05 ENCOUNTER — Other Ambulatory Visit (HOSPITAL_COMMUNITY): Payer: 59

## 2017-03-06 ENCOUNTER — Other Ambulatory Visit (HOSPITAL_COMMUNITY): Payer: 59

## 2017-03-07 ENCOUNTER — Ambulatory Visit (INDEPENDENT_AMBULATORY_CARE_PROVIDER_SITE_OTHER): Payer: 59 | Admitting: Psychology

## 2017-03-07 ENCOUNTER — Other Ambulatory Visit (HOSPITAL_COMMUNITY): Payer: 59 | Admitting: Psychiatry

## 2017-03-07 DIAGNOSIS — F319 Bipolar disorder, unspecified: Secondary | ICD-10-CM

## 2017-03-07 NOTE — Progress Notes (Signed)
[facility] IOP DISCHARGE NOTE  Patient:  Sheryl Lester DOB:  1956/06/21  Date of Admission: 01/29/2017  Date of Discharge: 03/11/2017  Reason for Admission:depression  IOP Course:attended and participated.  Seemed to have made improvement in reducing anxiety and depression.  She expects to be discharged on 16 July and says she is ready for discharge  Mental Status at Discharge:no suicidal threats  Diagnosis: borderline personality disorder and PTSD  Level of Care:  IOP  Discharge destination:  Has appointments with therapist and psychiatrist    Comments:  Continue Rexulti 2 mg daily, clonazepam 2 mg hs and 0.5 mg daily as needed, desvenlafaxine 100 mg daily , Equetro 600 mg bid and prazosin 2 mg hs  The patient received suicide prevention pamphlet:  Yes   Donnelly Angelica, MD Patient ID: Sheryl Lester, female   DOB: [birth year], 61 y.o.   MRN: [id number]

## 2017-03-07 NOTE — Progress Notes (Signed)
    Daily Group Progress Note  Program: IOP  Group Time: 9:00-12:00  Participation Level: Active  Behavioral Response: Appropriate  Type of Therapy:  Group Therapy  Summary of Progress: Pt. Participated in medication management education group with the pharmacist. Pt. Reported that she was feeling very sad about her father's health condition. Pt. Shared with the group that he has a full time care-giver and her siblings share the caregiving responsibilities with her, but she continues to worry about what will happen in the future. Pt. Was able to acknowledge that she needs to work on strategies to stay in the present moment.     Nancie Neas, LPC

## 2017-03-08 ENCOUNTER — Other Ambulatory Visit (HOSPITAL_COMMUNITY): Payer: 59

## 2017-03-11 ENCOUNTER — Encounter (HOSPITAL_COMMUNITY): Payer: Self-pay

## 2017-03-11 ENCOUNTER — Telehealth (HOSPITAL_COMMUNITY): Payer: Self-pay

## 2017-03-11 ENCOUNTER — Other Ambulatory Visit (HOSPITAL_COMMUNITY): Payer: 59 | Admitting: Psychiatry

## 2017-03-11 DIAGNOSIS — F329 Major depressive disorder, single episode, unspecified: Secondary | ICD-10-CM | POA: Diagnosis not present

## 2017-03-11 NOTE — Progress Notes (Signed)
    Daily Group Progress Note  Program: IOP  03/11/2017   Group Time: 9:00 - 10:30 AM   Participation Level: Active  Behavioral Response: Appropriate  Type of Therapy:  Psycho-education Group  Summary of Progress:  Patient was attentive to psycho-education group on pharmacology.      Group Time: 10:30 - 12 PM  Participation Level:  Active  Behavioral Response: Appropriate, Sharing and Attentive  Type of Therapy: Process Group  Summary of Progress: Patients had opportunity to process their feelings and behavioral responses to current life situation. Patient choose to focus on    Sheilah Pigeon, LCSW

## 2017-03-11 NOTE — Patient Instructions (Signed)
D:  Pt completed MH-IOP today.  A:  Will follow up with Dr. Daron Offer on 03-19-17 @ 1 pm and Dr. Cheryln Manly 03-26-17 @ 4 pm.  Encouraged support groups.  R:  Pt receptive.

## 2017-03-11 NOTE — Progress Notes (Signed)
Sheryl Lester is a 61 y.o., old, married, unemployed, Caucasian female who was referred per Dr. Daron Offer; treatment for worsening depressive and anxiety symptoms.  Continues to deny SI/HI or A/V hallucinations or paranoia.  Multiple stressors:  1)  Medical Illness:  Central Apnea, HTN, Trigeninal Neuralgia.  States she was dx with TN II at age 30.  Reports pain in her face all the time.  2)  Husband of 23 yrs is legally blind.  Pt has to drive him everywhere.  He is a Chief Executive Officer.  3)  Financial Strain  4) Unresolved grief/loss issues:  66 yr old nephew dx with Leukemia.  Is in need of a bone marrow transplant.  On 10/07/2016, mother died of cancer.  Father (retired Engineer, drilling) has [place] and is still resides in his home.  Pt goes to [place], [place] every Thurs-Sun to assist him.  Two prior psychiatric hospitalizatiions:  14 yrs ago [facility] and in Oct. 2017 [facility].  Has seen Dr. Apolonio Schneiders five times recently and currently with Dr. Daron Offer.  Denies any previous suicide attempts or gestures.  Family hx:  Father (depression). Pt completed MH-IOP today.  Reports still feeling somewhat fragile.  "I feel that I may lose the skills that I've learned."  Discussed the importance of continuing in a support group or with [place] [facility] to continue applying those skills learned.  Pt is anxious about a decision she made recently.  Apparently, patient has told an aunt that she could move into her (pt's) home.  According to pt, the aunt is having marital issues and needs to leave the home.  "I asked my husband and he said it would be ok.  She won't be coming until Sept. 2018, but I have a list of things I've been putting off that I need to do between now and then." A:  D/C today.  F/U with Dr. Daron Offer on 03-19-17 @ 1pm and Dr. Cheryln Manly on 03-26-17 @ 4 pm.  Encouraged support groups.  R:  Pt receptive.                                                           Carlis Abbott, RITA, M.Ed, CNA

## 2017-03-11 NOTE — Telephone Encounter (Signed)
Medication problem - Fax received from patient's pharmacy requesting an alternative for Rexulti medication.  Patient given coupon recently.

## 2017-03-12 ENCOUNTER — Other Ambulatory Visit (HOSPITAL_COMMUNITY): Payer: 59

## 2017-03-13 ENCOUNTER — Other Ambulatory Visit (HOSPITAL_COMMUNITY): Payer: 59

## 2017-03-14 ENCOUNTER — Other Ambulatory Visit (HOSPITAL_COMMUNITY): Payer: 59

## 2017-03-14 ENCOUNTER — Other Ambulatory Visit: Payer: Self-pay | Admitting: Internal Medicine

## 2017-03-14 ENCOUNTER — Other Ambulatory Visit (HOSPITAL_COMMUNITY): Payer: Self-pay

## 2017-03-15 ENCOUNTER — Other Ambulatory Visit (HOSPITAL_COMMUNITY): Payer: 59

## 2017-03-15 NOTE — Telephone Encounter (Signed)
I called patients insurance and was able to get an authorization. The pharmacy states the cost to the patient would now be $15. I called the patient and let her know.

## 2017-03-18 ENCOUNTER — Other Ambulatory Visit (HOSPITAL_COMMUNITY): Payer: 59

## 2017-03-18 NOTE — Telephone Encounter (Signed)
Awesome thank you!

## 2017-03-19 ENCOUNTER — Ambulatory Visit (INDEPENDENT_AMBULATORY_CARE_PROVIDER_SITE_OTHER): Payer: 59 | Admitting: Psychiatry

## 2017-03-19 ENCOUNTER — Other Ambulatory Visit (HOSPITAL_COMMUNITY): Payer: 59

## 2017-03-19 ENCOUNTER — Encounter (HOSPITAL_COMMUNITY): Payer: Self-pay | Admitting: Psychiatry

## 2017-03-19 VITALS — BP 140/86 | HR 60 | Ht 66.25 in | Wt 150.0 lb

## 2017-03-19 DIAGNOSIS — F4312 Post-traumatic stress disorder, chronic: Secondary | ICD-10-CM | POA: Diagnosis not present

## 2017-03-19 DIAGNOSIS — F603 Borderline personality disorder: Secondary | ICD-10-CM | POA: Diagnosis not present

## 2017-03-19 DIAGNOSIS — Z560 Unemployment, unspecified: Secondary | ICD-10-CM | POA: Diagnosis not present

## 2017-03-19 DIAGNOSIS — F3181 Bipolar II disorder: Secondary | ICD-10-CM

## 2017-03-19 DIAGNOSIS — Z818 Family history of other mental and behavioral disorders: Secondary | ICD-10-CM | POA: Diagnosis not present

## 2017-03-19 NOTE — Patient Instructions (Signed)
Taper clonazepam to 1 mg nightly for 2 week  After 2 weeks, decrease clonazepam to 0.5 mg tablet (1/2 tablet)  After another 2 weeks, decrease to 1/4 tablet   Decrease Rexulti to 1/2 tablet (1 mg)   Come back in 6 weeks

## 2017-03-19 NOTE — Progress Notes (Signed)
[name] MD/PA/NP OP Progress Note  03/19/2017 1:40 PM [name]  MRN:  [id number]  Chief Complaint:  Chief Complaint    Follow-up    med management  Subjective:  Sheryl Lester reports that her mood is feeling better with Rexulti.  She has tapered her clonazepam to 1.5 mg nightly. She has also managed to lose about 5 pounds, as she notices that her metabolism is improving with getting off of clonazepam.  She reports that Rexulti does increase her appetite, so it has been somewhat of an uphill battle at times. She denies any suicidal thoughts or unsafe thoughts. She wishes to stay on the Rexulti but decrease dose to 1 mg.    We agreed to continue tapering clonazepam to 1 mg for 2 weeks, then 0.5 mg for 2 weeks, then 0.25 mg for 2 weeks.  When she returns, if necessary, we may consider a stimulant agent such as Strattera to curb some of the increased appetite she has had with Rexulti if the dose decreased does not help.    She reports that she did take away some lessons and knowledge from the group therapies. She graduated last week and I congratulated her on this. I spent time encouraging her to be mindful on a daily basis about her mental health, and about her mood. We spent time discussing some behavioral strategies to continue on this positive trajectory.  Visit Diagnosis:    ICD-10-CM   1. Borderline personality disorder F60.3   2. Bipolar 2 disorder, major depressive episode ([place]) F31.81   3. Chronic post-traumatic stress disorder (PTSD) F43.12     Past Psychiatric History: See intake H&P for full details. Reviewed, with no updates at this time.  Past Medical History:  Past Medical History:  Diagnosis Date  . ALLERGIC RHINITIS 01/20/2008  . ANEMIA-IRON DEFICIENCY 01/20/2008  . ANXIETY 01/20/2008  . ASTHMATIC BRONCHITIS, ACUTE 05/19/2008  . Bipolar disorder ([place])   . CENTRAL SLEEP APNEA CONDS CLASSIFIED ELSEWHERE 08/31/2010  . Complication of anesthesia 1987   "w/one of the  anesthesia agents that they don't use anymore; chest muscles constricted; felt like I couldn't breath"  . DEPRESSION 01/20/2008  . DYSPHAGIA UNSPECIFIED 12/06/2008  . ELEVATED BLOOD PRESSURE WITHOUT DIAGNOSIS OF HYPERTENSION 12/06/2008  . GERD   . History of sleep walking   . HYPERLIPIDEMIA 01/20/2008  . HYPERTENSION 12/16/2008  . Melanoma ([place]) 2012   "skin of my stomach"  . Migraine    "related to menses; went away after hysterectomy"  . OSA treated with BiPAP   . OTITIS MEDIA, ACUTE, BILATERAL 12/16/2008  . OTITIS MEDIA, SEROUS, CHRONIC 12/30/2008  . PVD 05/12/2010  . Pyelonephritis "hospitalized in ~ 1991"  . SINUSITIS- ACUTE-NOS 09/21/2008  . SWELLING MASS OR LUMP IN HEAD AND NECK 09/21/2008  . THYROID NODULE, LEFT    "unable to aspirate; has gotten very small"   . Trigeminal neuralgia 01/20/2008  . Unspecified hearing loss 09/21/2008  . VENOUS INSUFFICIENCY, LEGS 05/03/2010    Past Surgical History:  Procedure Laterality Date  . ABDOMINAL HYSTERECTOMY  2000  . APPENDECTOMY  2000  . BRAIN SURGERY    . BREAST BIOPSY Left 1994   Benign  . 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 CHOLECYSTECTOMY N/A 12/13/2015   Procedure: LAPAROSCOPIC CHOLECYSTECTOMY;  Surgeon: Mickeal Skinner, MD;  Location: [facility];  Service: General;  Laterality: N/A;  . INGUINAL HERNIA REPAIR Right 1987  . MELANOMA EXCISION  2015   "  skin of my stomach"  . OOPHORECTOMY Bilateral 2000    Family Psychiatric History: See intake H&P for full details. Reviewed, with no updates at this time.   Family History:  Family History  Problem Relation Age of Onset  . Hyperlipidemia Mother   . Hypertension Mother   . Diabetes Father   . Melanoma Father   . Anxiety disorder Father   . Depression Father   . Melanoma Sister   . Anxiety disorder Sister   . Depression Sister   . Heart disease Paternal Grandmother   . Anxiety disorder Brother   . Depression Brother   . Breast cancer  Paternal Aunt   . Heart disease Other        Mother side of family-several uncles   . Anxiety disorder Sister   . Depression Sister   . Colon cancer Neg Hx     Social History:  Social History   Social History  . Marital status: Married    Spouse name: N/A  . Number of children: 0  . Years of education: N/A   Occupational History  . disabled Unemployed   Social History Main Topics  . Smoking status: Never Smoker  . Smokeless tobacco: Never Used  . Alcohol use No  . Drug use: No  . Sexual activity: Yes    Partners: Male    Birth control/ protection: None   Other Topics Concern  . None   Social History Narrative  . None    Allergies:  Allergies  Allergen Reactions  . Aripiprazole Rash and Swelling  . Fentanyl     Other reaction(s): Other Sleep Walking  . Avapro [Irbesartan] Other (See Comments)    hyponatremia  . Sulfamethoxazole-Trimethoprim Nausea And Vomiting  . Bupropion Rash  . Ibuprofen Nausea And Vomiting    Metabolic Disorder Labs: Lab Results  Component Value Date   HGBA1C 5.6 09/16/2015   No results found for: PROLACTIN Lab Results  Component Value Date   CHOL 205 (H) 09/16/2015   TRIG 91.0 09/16/2015   HDL 86.00 09/16/2015   CHOLHDL 2 09/16/2015   VLDL 18.2 09/16/2015   LDLCALC 101 (H) 09/16/2015   LDLCALC 71 06/02/2012     Current Medications: Current Outpatient Prescriptions  Medication Sig Dispense Refill  . aspirin 81 MG EC tablet Take 81 mg by mouth every morning.     . Brexpiprazole (REXULTI) 2 MG TABS Take 2 mg by mouth daily. 90 tablet 1  . Buprenorphine (BUTRANS) 20 MCG/HR PTWK Place 20 mcg onto the skin every Tuesday.     . clonazePAM (KLONOPIN) 1 MG tablet Use 2 mg at night at 0.5 mg in the morning 120 tablet 0  . desvenlafaxine (PRISTIQ) 100 MG 24 hr tablet Take 1 tablet (100 mg total) by mouth daily. 90 tablet 1  . EQUETRO 300 MG CP12 Take 600 mg by mouth 2 (two) times daily.   11  . metoprolol (LOPRESSOR) 100 MG tablet  Take 100 mg by mouth 2 (two) times daily.    . NON FORMULARY at bedtime. BI-Pap    . NUCYNTA 100 MG TABS Take 250 mg by mouth 3 (three) times daily.    . prazosin (MINIPRESS) 2 MG capsule Take 1 capsule (2 mg total) by mouth at bedtime. 90 capsule 1  . BUTRANS 10 MCG/HR PTWK patch Take 10 mcg by mouth daily as needed (for pain).   2  . NONFORMULARY OR COMPOUNDED ITEM Take 1 application by mouth 4 (four) times daily  as needed. Topically on cheek and inside mouth of mouth to calm trigeminal nerve     No current facility-administered medications for this visit.     Neurologic: Headache: Negative Seizure: Negative Paresthesias: Negative  Musculoskeletal: Strength & Muscle Tone: within normal limits Gait & Station: normal Patient leans: N/A  Psychiatric Specialty Exam: ROS  Blood pressure 140/86, pulse 60, height 5' 6.25" (1.683 m), weight 150 lb (68 kg).Body mass index is 24.03 kg/m.  General Appearance: Casual and Fairly Groomed  Eye Contact:  Good  Speech:  Clear and Coherent  Volume:  Normal  Mood:  Dysphoric and Euthymic  Affect:  Congruent  Thought Process:  Coherent and Goal Directed  Orientation:  Full (Time, Place, and Person)  Thought Content: Logical   Suicidal Thoughts:  No  Homicidal Thoughts:  No  Memory:  Immediate;   Fair  Judgement:  Fair  Insight:  Fair  Psychomotor Activity:  Normal  Concentration:  Concentration: Good  Recall:  Good  Fund of Knowledge: Good  Language: Good  Akathisia:  Negative  Handed:  Right  AIMS (if indicated):  0  Assets:  Communication Skills Desire for Improvement Financial Resources/Insurance Housing Intimacy Leisure Time [place] Talents/Skills Transportation  ADL's:  Intact  Cognition: WNL  Sleep:  5-7 hours    Treatment Plan Summary: Sheryl Lester is a 61 year old female with borderline personality and bipolar 2. She presents today for med management follow-up. We continue to  taper clonazepam as below and will follow up in 6 weeks. She continues in individual therapy with Dr. Cheryln Manly, and recently graduated from the IOP.  1. Borderline personality disorder   2. Bipolar 2 disorder, major depressive episode ([place])   3. Chronic post-traumatic stress disorder (PTSD)    Taper her clonazepam to 1 mg 2 weeks, then 0.5 mg 2 weeks, then 0.25 mg 2 weeks Start melatonin 3 mg with sundown Continue Rexulti at a decreased dose of 1 mg daily, if the patient notices an increase in agitation or irritability she can increase the dose back to 2 mg Continue Pristiq 100 mg for mood Continue prazosin 2-4 mg nightly Continue Tegretol 600 mg twice daily per neurology  Aundra Dubin, MD 03/19/2017, 1:40 PM

## 2017-03-20 ENCOUNTER — Other Ambulatory Visit (HOSPITAL_COMMUNITY): Payer: 59

## 2017-03-21 ENCOUNTER — Other Ambulatory Visit (HOSPITAL_COMMUNITY): Payer: 59

## 2017-03-22 ENCOUNTER — Other Ambulatory Visit (HOSPITAL_COMMUNITY): Payer: 59

## 2017-03-25 ENCOUNTER — Other Ambulatory Visit (HOSPITAL_COMMUNITY): Payer: 59

## 2017-03-26 ENCOUNTER — Other Ambulatory Visit (HOSPITAL_COMMUNITY): Payer: 59

## 2017-03-26 ENCOUNTER — Ambulatory Visit (INDEPENDENT_AMBULATORY_CARE_PROVIDER_SITE_OTHER): Payer: 59 | Admitting: Psychology

## 2017-03-26 DIAGNOSIS — F319 Bipolar disorder, unspecified: Secondary | ICD-10-CM

## 2017-03-27 ENCOUNTER — Other Ambulatory Visit (HOSPITAL_COMMUNITY): Payer: 59

## 2017-03-28 ENCOUNTER — Other Ambulatory Visit (HOSPITAL_COMMUNITY): Payer: 59

## 2017-03-29 ENCOUNTER — Other Ambulatory Visit (HOSPITAL_COMMUNITY): Payer: 59

## 2017-04-01 ENCOUNTER — Other Ambulatory Visit (HOSPITAL_COMMUNITY): Payer: 59

## 2017-04-02 ENCOUNTER — Other Ambulatory Visit (HOSPITAL_COMMUNITY): Payer: 59

## 2017-04-03 ENCOUNTER — Other Ambulatory Visit (HOSPITAL_COMMUNITY): Payer: 59

## 2017-04-04 ENCOUNTER — Other Ambulatory Visit (HOSPITAL_COMMUNITY): Payer: 59

## 2017-04-05 ENCOUNTER — Other Ambulatory Visit (HOSPITAL_COMMUNITY): Payer: 59

## 2017-04-08 ENCOUNTER — Other Ambulatory Visit (HOSPITAL_COMMUNITY): Payer: 59

## 2017-04-09 ENCOUNTER — Other Ambulatory Visit (HOSPITAL_COMMUNITY): Payer: 59

## 2017-04-10 ENCOUNTER — Other Ambulatory Visit (HOSPITAL_COMMUNITY): Payer: 59

## 2017-04-11 ENCOUNTER — Other Ambulatory Visit (HOSPITAL_COMMUNITY): Payer: 59

## 2017-04-12 ENCOUNTER — Other Ambulatory Visit (HOSPITAL_COMMUNITY): Payer: 59

## 2017-05-06 ENCOUNTER — Ambulatory Visit (HOSPITAL_COMMUNITY): Payer: 59 | Admitting: Psychiatry

## 2017-05-13 ENCOUNTER — Ambulatory Visit: Payer: 59 | Admitting: Psychology

## 2017-05-28 ENCOUNTER — Other Ambulatory Visit: Payer: Self-pay | Admitting: Internal Medicine

## 2017-06-03 ENCOUNTER — Telehealth (HOSPITAL_COMMUNITY): Payer: Self-pay

## 2017-06-03 ENCOUNTER — Other Ambulatory Visit (HOSPITAL_COMMUNITY): Payer: Self-pay | Admitting: Psychiatry

## 2017-06-03 NOTE — Telephone Encounter (Signed)
This is Dr Sharyon Medicus Patient

## 2017-06-03 NOTE — Telephone Encounter (Signed)
Patient is calling for a refill on Clonazepam, she has a follow up later this month. Please review and advise, thank you

## 2017-06-03 NOTE — Telephone Encounter (Signed)
We tapered clonazepam off, she should no longer be taking that

## 2017-06-04 ENCOUNTER — Other Ambulatory Visit (HOSPITAL_COMMUNITY): Payer: Self-pay

## 2017-06-04 MED ORDER — CLONAZEPAM 0.5 MG PO TABS: 0.5000 mg | ORAL_TABLET | Freq: Every day | ORAL | 0 refills | Status: DC

## 2017-06-04 NOTE — Telephone Encounter (Signed)
Patient states that she thought she was still supposed to be taking 1 mg at bedtime. I read to her your note and that reminded her. She would like to know if she can still do 0.5 mg at bedtime until she sees you, that will require a new prescription. Please review and advise, thank you

## 2017-06-04 NOTE — Telephone Encounter (Signed)
Okay, I called in to the pharmacy and called the patient to let her know it had been done.

## 2017-06-04 NOTE — Telephone Encounter (Signed)
Yes we can continue 0.5 mg at bedtime for now

## 2017-06-18 ENCOUNTER — Encounter (HOSPITAL_COMMUNITY): Payer: Self-pay | Admitting: Psychiatry

## 2017-06-18 ENCOUNTER — Ambulatory Visit (INDEPENDENT_AMBULATORY_CARE_PROVIDER_SITE_OTHER): Payer: 59 | Admitting: Psychiatry

## 2017-06-18 VITALS — BP 122/70 | HR 78 | Ht 67.0 in | Wt 157.8 lb

## 2017-06-18 DIAGNOSIS — Z818 Family history of other mental and behavioral disorders: Secondary | ICD-10-CM | POA: Diagnosis not present

## 2017-06-18 DIAGNOSIS — F988 Other specified behavioral and emotional disorders with onset usually occurring in childhood and adolescence: Secondary | ICD-10-CM | POA: Diagnosis not present

## 2017-06-18 DIAGNOSIS — F603 Borderline personality disorder: Secondary | ICD-10-CM | POA: Diagnosis not present

## 2017-06-18 DIAGNOSIS — Z560 Unemployment, unspecified: Secondary | ICD-10-CM | POA: Diagnosis not present

## 2017-06-18 DIAGNOSIS — F4312 Post-traumatic stress disorder, chronic: Secondary | ICD-10-CM

## 2017-06-18 DIAGNOSIS — F909 Attention-deficit hyperactivity disorder, unspecified type: Secondary | ICD-10-CM | POA: Diagnosis not present

## 2017-06-18 MED ORDER — DESVENLAFAXINE SUCCINATE ER 100 MG PO TB24: 100.0000 mg | ORAL_TABLET | Freq: Every day | ORAL | 1 refills | Status: DC

## 2017-06-18 MED ORDER — AMPHETAMINE-DEXTROAMPHET ER 20 MG PO CP24: 20.0000 mg | ORAL_CAPSULE | Freq: Every day | ORAL | 0 refills | Status: DC

## 2017-06-18 MED ORDER — BREXPIPRAZOLE 1 MG PO TABS: 1.0000 mg | ORAL_TABLET | Freq: Every day | ORAL | 1 refills | Status: DC

## 2017-06-18 MED ORDER — SUVOREXANT 10 MG PO TABS: 10.0000 mg | ORAL_TABLET | Freq: Every day | ORAL | 2 refills | Status: DC

## 2017-06-18 NOTE — Progress Notes (Signed)
[facility] MD/PA/NP OP Progress Note  06/18/2017 9:34 AM Sheryl Lester  MRN:  [id number]  Chief Complaint: Medication management, feel horrible HPI: Sheryl Lester reports that she is feeling horrible and she has been dreading this appointment.  She launches into a dramatic recounting of all of the external stressors that have been going on, feeling that her aunt is being excessively burdensome and calls her too much and is too demanding, and her brother is continuing to be disrespectful towards her.  She reports that she is not handling anything well.  She denies any safety issues, aggressive acting out, and reports that her not handling things well as illustrated by her overeating, and avoiding all of the tasks and responsibilities she generally is able to take care of.  She reports that she feels irritated and down and fatigued.  I confirmed with her that she is no longer taking clonazepam, and I applauded her being able to taper and discontinue this.  She continues on Rexulti 1 milligram and Pristiq 100 mg.  I spent time with her reviewing options including adding Adderall XR which has been beneficial for her in the past for being able to focus on tasks at hand and avoid impulsive interpersonal interactions when she is more stressed.  I spent time with her also discussing a chain of events that she describes related to her family, and reflecting back on this with her.  We made a chart of what items are within her control and what items are outside of her control and I provided the patient with psychoeducation about external locus of control.  She was able to also reflect on her fear that she will be as burdensome as her aunt is, when the patient herself is older.  She will be following up with her therapist in 2 weeks and hopes to see them more often, and will try to work out a schedule to see them at least every 2 weeks.  Visit Diagnosis:    ICD-10-CM   1. Attention deficit disorder, unspecified  hyperactivity presence F98.8   2. Borderline personality disorder (H[facility]) F60.3 Brexpiprazole (REXULTI) 1 MG TABS    desvenlafaxine (PRISTIQ) 100 MG 24 hr tablet    amphetamine-dextroamphetamine (ADDERALL XR) 20 MG 24 hr capsule    amphetamine-dextroamphetamine (ADDERALL XR) 20 MG 24 hr capsule    amphetamine-dextroamphetamine (ADDERALL XR) 20 MG 24 hr capsule    Suvorexant (BELSOMRA) 10 MG TABS  3. Chronic post-traumatic stress disorder (PTSD) F43.12 Brexpiprazole (REXULTI) 1 MG TABS    desvenlafaxine (PRISTIQ) 100 MG 24 hr tablet    amphetamine-dextroamphetamine (ADDERALL XR) 20 MG 24 hr capsule    amphetamine-dextroamphetamine (ADDERALL XR) 20 MG 24 hr capsule    amphetamine-dextroamphetamine (ADDERALL XR) 20 MG 24 hr capsule    Suvorexant (BELSOMRA) 10 MG TABS    Past Psychiatric History: See intake H&P for full details. Reviewed, with no updates at this time.   Past Medical History:  Past Medical History:  Diagnosis Date  . ALLERGIC RHINITIS 01/20/2008  . ANEMIA-IRON DEFICIENCY 01/20/2008  . ANXIETY 01/20/2008  . ASTHMATIC BRONCHITIS, ACUTE 05/19/2008  . Bipolar disorder ([place])   . CENTRAL SLEEP APNEA CONDS CLASSIFIED ELSEWHERE 08/31/2010  . Complication of anesthesia 1987   "w/one of the anesthesia agents that they don't use anymore; chest muscles constricted; felt like I couldn't breath"  . DEPRESSION 01/20/2008  . DYSPHAGIA UNSPECIFIED 12/06/2008  . ELEVATED BLOOD PRESSURE WITHOUT DIAGNOSIS OF HYPERTENSION 12/06/2008  . GERD   . History of  sleep walking   . HYPERLIPIDEMIA 01/20/2008  . HYPERTENSION 12/16/2008  . Melanoma ([place]) 2012   "skin of my stomach"  . Migraine    "related to menses; went away after hysterectomy"  . OSA treated with BiPAP   . OTITIS MEDIA, ACUTE, BILATERAL 12/16/2008  . OTITIS MEDIA, SEROUS, CHRONIC 12/30/2008  . PVD 05/12/2010  . Pyelonephritis "hospitalized in ~ 1991"  . SINUSITIS- ACUTE-NOS 09/21/2008  . SWELLING MASS OR LUMP IN HEAD AND NECK 09/21/2008  .  THYROID NODULE, LEFT    "unable to aspirate; has gotten very small"   . Trigeminal neuralgia 01/20/2008  . Unspecified hearing loss 09/21/2008  . VENOUS INSUFFICIENCY, LEGS 05/03/2010    Past Surgical History:  Procedure Laterality Date  . ABDOMINAL HYSTERECTOMY  2000  . APPENDECTOMY  2000  . BRAIN SURGERY    . BREAST BIOPSY Left 1994   Benign  . 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 CHOLECYSTECTOMY N/A 12/13/2015   Procedure: LAPAROSCOPIC CHOLECYSTECTOMY;  Surgeon: Mickeal Skinner, MD;  Location: [facility];  Service: General;  Laterality: N/A;  . INGUINAL HERNIA REPAIR Right 1987  . MELANOMA EXCISION  2015   "skin of my stomach"  . OOPHORECTOMY Bilateral 2000    Family Psychiatric History: See intake H&P for full details. Reviewed, with no updates at this time.   Family History:  Family History  Problem Relation Age of Onset  . Hyperlipidemia Mother   . Hypertension Mother   . Diabetes Father   . Melanoma Father   . Anxiety disorder Father   . Depression Father   . Melanoma Sister   . Anxiety disorder Sister   . Depression Sister   . Heart disease Paternal Grandmother   . Anxiety disorder Brother   . Depression Brother   . Breast cancer Paternal Aunt   . Heart disease Other        Mother side of family-several uncles   . Anxiety disorder Sister   . Depression Sister   . Colon cancer Neg Hx     Social History:  Social History   Social History  . Marital status: Married    Spouse name: N/A  . Number of children: 0  . Years of education: N/A   Occupational History  . disabled Unemployed   Social History Main Topics  . Smoking status: Never Smoker  . Smokeless tobacco: Never Used  . Alcohol use No  . Drug use: No  . Sexual activity: Yes    Partners: Male    Birth control/ protection: None   Other Topics Concern  . None   Social History Narrative  . None    Allergies:  Allergies  Allergen Reactions  .  Aripiprazole Rash and Swelling  . Fentanyl     Other reaction(s): Other Sleep Walking  . Avapro [Irbesartan] Other (See Comments)    hyponatremia  . Sulfamethoxazole-Trimethoprim Nausea And Vomiting  . Bupropion Rash  . Ibuprofen Nausea And Vomiting    Metabolic Disorder Labs: Lab Results  Component Value Date   HGBA1C 5.6 09/16/2015   No results found for: PROLACTIN Lab Results  Component Value Date   CHOL 205 (H) 09/16/2015   TRIG 91.0 09/16/2015   HDL 86.00 09/16/2015   CHOLHDL 2 09/16/2015   VLDL 18.2 09/16/2015   LDLCALC 101 (H) 09/16/2015   LDLCALC 71 06/02/2012   Lab Results  Component Value Date   TSH 0.59 03/02/2016   TSH 0.86  06/14/2015    Therapeutic Level Labs: No results found for: LITHIUM No results found for: VALPROATE No components found for:  CBMZ  Current Medications: Current Outpatient Prescriptions  Medication Sig Dispense Refill  . amphetamine-dextroamphetamine (ADDERALL XR) 20 MG 24 hr capsule Take 1 capsule (20 mg total) by mouth daily. 30 capsule 0  . [START ON 07/18/2017] amphetamine-dextroamphetamine (ADDERALL XR) 20 MG 24 hr capsule Take 1 capsule (20 mg total) by mouth daily. 30 capsule 0  . [START ON 08/17/2017] amphetamine-dextroamphetamine (ADDERALL XR) 20 MG 24 hr capsule Take 1 capsule (20 mg total) by mouth daily. 30 capsule 0  . aspirin 81 MG EC tablet Take 81 mg by mouth every morning.     . Brexpiprazole (REXULTI) 1 MG TABS Take 1 tablet (1 mg total) by mouth daily. 90 tablet 1  . Buprenorphine (BUTRANS) 20 MCG/HR PTWK Place 20 mcg onto the skin every Tuesday.     Marland Kitchen BUTRANS 10 MCG/HR PTWK patch Take 10 mcg by mouth daily as needed (for pain).   2  . desvenlafaxine (PRISTIQ) 100 MG 24 hr tablet Take 1 tablet (100 mg total) by mouth daily. 90 tablet 1  . EQUETRO 300 MG CP12 Take 600 mg by mouth 2 (two) times daily.   11  . metoprolol (LOPRESSOR) 100 MG tablet Take 100 mg by mouth 2 (two) times daily.    . NON FORMULARY at bedtime.  BI-Pap    . NONFORMULARY OR COMPOUNDED ITEM Take 1 application by mouth 4 (four) times daily as needed. Topically on cheek and inside mouth of mouth to calm trigeminal nerve    . NUCYNTA 100 MG TABS Take 250 mg by mouth 3 (three) times daily.    . Suvorexant (BELSOMRA) 10 MG TABS Take 10 mg by mouth at bedtime. 30 tablet 2   No current facility-administered medications for this visit.      Musculoskeletal: Strength & Muscle Tone: within normal limits Gait & Station: normal Patient leans: N/A  Psychiatric Specialty Exam: ROS  Blood pressure 122/70, pulse 78, height 5\' 7"  (1.702 m), weight 157 lb 12.8 oz (71.6 kg).Body mass index is 24.71 kg/m.  General Appearance: Casual and Fairly Groomed  Eye Contact:  Fair  Speech:  Clear and Coherent  Volume:  Increased  Mood:  Dysphoric and Irritable  Affect:  Congruent  Thought Process:  Goal Directed and Descriptions of Associations: Intact  Orientation:  Full (Time, Place, and Person)  Thought Content: Rumination   Suicidal Thoughts:  No  Homicidal Thoughts:  No  Memory:  Immediate;   Fair  Judgement:  Fair  Insight:  Shallow  Psychomotor Activity:  Normal  Concentration:  Concentration: Fair  Recall:  [employer] of Knowledge: Fair  Language: Fair  Akathisia:  Negative  Handed:  Right  AIMS (if indicated): done - score 0  Assets:  Desire for Improvement Financial Resources/Insurance Housing Intimacy Social Support Transportation  ADL's:  Intact  Cognition: WNL  Sleep:  Poor   Screenings: PHQ2-9     Office Visit from 06/06/2016 in [facility] from 03/17/2014 in [facility]  PHQ-2 Total Score  6  0  PHQ-9 Total Score  20  -       Assessment and Plan:  Sheryl Lester is a 61 year old female with borderline personality disorder and comorbid mood symptoms and attention symptoms.  She presents today for medication management, continues with external locus of control.  We  spent  time today mapping out some of the external stressors, areas that are within her control and areas that are outside of her control.  We agreed to follow-up in 8 weeks and proceed as below regarding medication management changes.  1. Attention deficit disorder, unspecified hyperactivity presence   2. Borderline personality disorder ([place])   3. Chronic post-traumatic stress disorder (PTSD)     Status of current problems: gradually worsening  Labs Ordered: No orders of the defined types were placed in this encounter.   Labs Reviewed: NA  Collateral Obtained/Records Reviewed: N/A  Plan:  Continue Pristiq 100 mg daily Continue Rexulti 1 mg daily Initiate Adderall XR 20 mg daily; 3 months prescribed Initiate Belsomra 10 mg nightly for psychophysiologic insomnia Return to clinic in 8 weeks Continue in individual therapy  I spent 30 minutes with the patient in direct face-to-face clinical care.  Greater than 50% of this time was spent in counseling and coordination of care with the patient.    Aundra Dubin, MD 06/18/2017, 9:34 AM

## 2017-07-01 ENCOUNTER — Ambulatory Visit: Payer: 59 | Admitting: Psychology

## 2017-07-01 DIAGNOSIS — F319 Bipolar disorder, unspecified: Secondary | ICD-10-CM

## 2017-07-09 ENCOUNTER — Telehealth (HOSPITAL_COMMUNITY): Payer: Self-pay

## 2017-07-09 ENCOUNTER — Ambulatory Visit: Payer: 59 | Admitting: Psychology

## 2017-07-09 DIAGNOSIS — F319 Bipolar disorder, unspecified: Secondary | ICD-10-CM | POA: Diagnosis not present

## 2017-07-09 NOTE — Telephone Encounter (Signed)
Thank you for the update. Im seeing her next week and we can talk about it then

## 2017-07-09 NOTE — Telephone Encounter (Signed)
Okay, I called patient and left a voicemail letting her know

## 2017-07-09 NOTE — Telephone Encounter (Signed)
Patient is calling because the extended release Adderall is not helping her, she said she is not seeing any benefit. She is wanting to know if she can change to regular Adderall. Please review and advise, thank you

## 2017-07-16 ENCOUNTER — Ambulatory Visit (HOSPITAL_COMMUNITY): Payer: 59 | Admitting: Psychiatry

## 2017-07-16 ENCOUNTER — Encounter (HOSPITAL_COMMUNITY): Payer: Self-pay | Admitting: Psychiatry

## 2017-07-16 VITALS — BP 140/88 | HR 70 | Ht 66.5 in | Wt 156.0 lb

## 2017-07-16 DIAGNOSIS — Z560 Unemployment, unspecified: Secondary | ICD-10-CM

## 2017-07-16 DIAGNOSIS — F332 Major depressive disorder, recurrent severe without psychotic features: Secondary | ICD-10-CM | POA: Diagnosis not present

## 2017-07-16 DIAGNOSIS — Z630 Problems in relationship with spouse or partner: Secondary | ICD-10-CM

## 2017-07-16 DIAGNOSIS — F603 Borderline personality disorder: Secondary | ICD-10-CM | POA: Diagnosis not present

## 2017-07-16 DIAGNOSIS — Z6379 Other stressful life events affecting family and household: Secondary | ICD-10-CM | POA: Diagnosis not present

## 2017-07-16 NOTE — Progress Notes (Addendum)
[facility] MD/PA/NP OP Progress Note  07/17/2017 10:30 AM Sheryl Lester  MRN:  [id number]  Chief Complaint:  Chief Complaint    Follow-up     very bad, awful  HPI: Sheryl Lester presents 10 minutes late for her visit.  She reports that she is doing terribly, reports that nothing is going well, she continues to struggle with family stressors and relationship stressors.  She reports that she almost brought her husband so that he could tell her how bad she is being.  She reports that she feels even worse than at our last visit, and she stopped taking the Adderall because but the extended release does not work for her, and only the immediate release works.    She continues to feel depressed.  I offered her psychiatric hospitalization and she declines, reports that she is not suicidal, and she needs to be out of the hospital help take care of her dad.   I recommended that we consider tricyclic antidepressants and MAOIs given that she has failed over 20 medications.  She was not receptive to any medication changes.  I recommended that we proceed with an ECT consultation and she was agreeable to this.  She reports that Rexulti has not helped her, so she wants to discontinue.  I instructed her to discontinue both Rexulti and Adderall, she can continue on Equetro and Pristiq and follow-up in 12 weeks after ECT.  Visit Diagnosis:    ICD-10-CM   1. Severe episode of recurrent major depressive disorder, without psychotic features ([place]) F33.2 Ambulatory referral to Psychiatry    Past Psychiatric History: See intake H&P for full details. Reviewed, with no updates at this time.   Past Medical History:  Past Medical History:  Diagnosis Date  . ALLERGIC RHINITIS 01/20/2008  . ANEMIA-IRON DEFICIENCY 01/20/2008  . ANXIETY 01/20/2008  . ASTHMATIC BRONCHITIS, ACUTE 05/19/2008  . Bipolar disorder ([place])   . CENTRAL SLEEP APNEA CONDS CLASSIFIED ELSEWHERE 08/31/2010  . Complication of anesthesia 1987   "w/one of  the anesthesia agents that they don't use anymore; chest muscles constricted; felt like I couldn't breath"  . DEPRESSION 01/20/2008  . DYSPHAGIA UNSPECIFIED 12/06/2008  . ELEVATED BLOOD PRESSURE WITHOUT DIAGNOSIS OF HYPERTENSION 12/06/2008  . GERD   . History of sleep walking   . HYPERLIPIDEMIA 01/20/2008  . HYPERTENSION 12/16/2008  . Melanoma ([place]) 2012   "skin of my stomach"  . Migraine    "related to menses; went away after hysterectomy"  . OSA treated with BiPAP   . OTITIS MEDIA, ACUTE, BILATERAL 12/16/2008  . OTITIS MEDIA, SEROUS, CHRONIC 12/30/2008  . PVD 05/12/2010  . Pyelonephritis "hospitalized in ~ 1991"  . SINUSITIS- ACUTE-NOS 09/21/2008  . SWELLING MASS OR LUMP IN HEAD AND NECK 09/21/2008  . THYROID NODULE, LEFT    "unable to aspirate; has gotten very small"   . Trigeminal neuralgia 01/20/2008  . Unspecified hearing loss 09/21/2008  . VENOUS INSUFFICIENCY, LEGS 05/03/2010    Past Surgical History:  Procedure Laterality Date  . ABDOMINAL HYSTERECTOMY  2000  . APPENDECTOMY  2000  . BRAIN SURGERY    . BREAST BIOPSY Left 1994   Benign  . 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 CHOLECYSTECTOMY N/A 12/13/2015   Procedure: LAPAROSCOPIC CHOLECYSTECTOMY;  Surgeon: Mickeal Skinner, MD;  Location: [facility];  Service: General;  Laterality: N/A;  . INGUINAL HERNIA REPAIR Right 1987  . MELANOMA EXCISION  2015   "skin of my  stomach"  . OOPHORECTOMY Bilateral 2000    Family Psychiatric History: See intake H&P for full details. Reviewed, with no updates at this time.   Family History:  Family History  Problem Relation Age of Onset  . Hyperlipidemia Mother   . Hypertension Mother   . Diabetes Father   . Melanoma Father   . Anxiety disorder Father   . Depression Father   . Melanoma Sister   . Anxiety disorder Sister   . Depression Sister   . Heart disease Paternal Grandmother   . Anxiety disorder Brother   . Depression Brother   . Breast  cancer Paternal Aunt   . Heart disease Other        Mother side of family-several uncles   . Anxiety disorder Sister   . Depression Sister   . Colon cancer Neg Hx     Social History:  Social History   Socioeconomic History  . Marital status: Married    Spouse name: None  . Number of children: 0  . Years of education: None  . Highest education level: None  Social Needs  . Financial resource strain: None  . Food insecurity - worry: None  . Food insecurity - inability: None  . Transportation needs - medical: None  . Transportation needs - non-medical: None  Occupational History  . Occupation: disabled    Fish farm manager: UNEMPLOYED  Tobacco Use  . Smoking status: Never Smoker  . Smokeless tobacco: Never Used  Substance and Sexual Activity  . Alcohol use: No  . Drug use: No  . Sexual activity: Not Currently    Partners: Male    Birth control/protection: None  Other Topics Concern  . None  Social History Narrative  . None    Allergies:  Allergies  Allergen Reactions  . Aripiprazole Rash and Swelling  . Fentanyl     Other reaction(s): Other Sleep Walking  . Avapro [Irbesartan] Other (See Comments)    hyponatremia  . Sulfamethoxazole-Trimethoprim Nausea And Vomiting  . Bupropion Rash  . Ibuprofen Nausea And Vomiting    Metabolic Disorder Labs: Lab Results  Component Value Date   HGBA1C 5.6 09/16/2015   No results found for: PROLACTIN Lab Results  Component Value Date   CHOL 205 (H) 09/16/2015   TRIG 91.0 09/16/2015   HDL 86.00 09/16/2015   CHOLHDL 2 09/16/2015   VLDL 18.2 09/16/2015   LDLCALC 101 (H) 09/16/2015   LDLCALC 71 06/02/2012   Lab Results  Component Value Date   TSH 0.59 03/02/2016   TSH 0.86 06/14/2015    Therapeutic Level Labs: No results found for: LITHIUM No results found for: VALPROATE No components found for:  CBMZ  Current Medications: Current Outpatient Medications  Medication Sig Dispense Refill  . aspirin 81 MG EC tablet Take  81 mg by mouth every morning.     . Buprenorphine (BUTRANS) 20 MCG/HR PTWK Place 20 mcg onto the skin every Tuesday.     Marland Kitchen BUTRANS 10 MCG/HR PTWK patch Take 10 mcg by mouth daily as needed (for pain).   2  . desvenlafaxine (PRISTIQ) 100 MG 24 hr tablet Take 1 tablet (100 mg total) by mouth daily. 90 tablet 1  . EQUETRO 300 MG CP12 Take 600 mg by mouth 2 (two) times daily.   11  . metoprolol (LOPRESSOR) 100 MG tablet Take 100 mg by mouth 2 (two) times daily.    . NON FORMULARY at bedtime. BI-Pap    . NONFORMULARY OR COMPOUNDED ITEM Take 1  application by mouth 4 (four) times daily as needed. Topically on cheek and inside mouth of mouth to calm trigeminal nerve    . NUCYNTA 100 MG TABS Take 250 mg by mouth 3 (three) times daily.    . Suvorexant (BELSOMRA) 10 MG TABS Take 10 mg by mouth at bedtime. 30 tablet 2   No current facility-administered medications for this visit.      Musculoskeletal: Strength & Muscle Tone: within normal limits Gait & Station: normal Patient leans: N/A  Psychiatric Specialty Exam: ROS  Blood pressure 140/88, pulse 70, height 5' 6.5" (1.689 m), weight 156 lb (70.8 kg), SpO2 97 %.Body mass index is 24.8 kg/m.  General Appearance: Casual and Fairly Groomed  Eye Contact:  Fair  Speech:  Clear and Coherent  Volume:  Normal  Mood:  Depressed and Dysphoric  Affect:  Congruent  Thought Process:  Goal Directed and Descriptions of Associations: Intact  Orientation:  Full (Time, Place, and Person)  Thought Content: Logical   Suicidal Thoughts:  No  Homicidal Thoughts:  No  Memory:  Immediate;   Fair  Judgement:  Fair  Insight:  Lacking  Psychomotor Activity:  Normal  Concentration:  Concentration: Good  Recall:  [employer] of Knowledge: Fair  Language: Fair  Akathisia:  Negative  Handed:  Right  AIMS (if indicated): not done  Assets:  Communication Skills Desire for Improvement Financial Resources/Insurance Housing Social Support Transportation  ADL's:   Intact  Cognition: WNL  Sleep:  Good   Screenings: AIMS     Office Visit from 07/16/2017 in [facility]  AIMS Total Score  0    PHQ2-9     Office Visit from 06/06/2016 in [facility] at [facility] from 03/17/2014 in [facility]  PHQ-2 Total Score  6  0  PHQ-9 Total Score  20  No data       Assessment and Plan:  Sheryl Lester continues to struggle with depressed mood in the context of severe major depressive disorder and borderline personality.  She has participated extremely poorly in individual therapy, and despite multiple attempts to educate her on the value and the necessity of individual therapy, she continues to focus on medication management as the key to improving her mood.  She has been tried on over 20 psychiatric medications, and has been cared for by multiple psychiatrists.  I spent time today as I have in previous sessions once again educating her on additional medication options including tricyclics and MAOIs.  If she is quite concerned about side effects due to medications.  I recommended we proceed with an ECT consultation and she may follow-up with this writer in 12 weeks for med management at that point.   1. Severe episode of recurrent major depressive disorder, without psychotic features ([place])     Status of current problems: gradually worsening  Labs Ordered: Orders Placed This Encounter  Procedures  . Ambulatory referral to Psychiatry    Referral Priority:   Routine    Referral Type:   Psychiatric    Referral Reason:   Specialty Services Required    Referred to Provider:   Gonzella Lex, MD    Requested Specialty:   Psychiatry    Number of Visits Requested:   1    Labs Reviewed: NA  Collateral Obtained/Records Reviewed: N/A  Plan:  Discontinue Adderall given lack of benefit Continue Pristiq Discontinue Rexulti per patient request given lack of benefit Continue  Equetro 600  mg twice a day per pain management Continue Belsomra 10 mg at bedtime Referral for ECT  I spent 20 minutes with the patient in direct face-to-face clinical care.  Greater than 50% of this time was spent in counseling and coordination of care with the patient.    Aundra Dubin, MD 07/17/2017, 10:30 AM

## 2017-07-25 ENCOUNTER — Telehealth (HOSPITAL_COMMUNITY): Payer: Self-pay

## 2017-07-25 ENCOUNTER — Ambulatory Visit: Payer: Self-pay | Admitting: Internal Medicine

## 2017-07-25 NOTE — Telephone Encounter (Signed)
Patient would like to talk to you , she wants to start group. Please call her back at [phone]

## 2017-07-31 ENCOUNTER — Ambulatory Visit: Payer: Self-pay | Admitting: Internal Medicine

## 2017-08-12 ENCOUNTER — Telehealth (HOSPITAL_COMMUNITY): Payer: Self-pay

## 2017-08-12 ENCOUNTER — Ambulatory Visit (INDEPENDENT_AMBULATORY_CARE_PROVIDER_SITE_OTHER): Payer: 59 | Admitting: Psychology

## 2017-08-12 DIAGNOSIS — F319 Bipolar disorder, unspecified: Secondary | ICD-10-CM | POA: Diagnosis not present

## 2017-08-12 NOTE — Telephone Encounter (Signed)
I called patient back and she does not feel she is bad enough to go to the hospital. I gave her information to the front desk and they will call her if there is a cancellation.

## 2017-08-12 NOTE — Telephone Encounter (Signed)
Patient called and asked about her Rexulti, I told patient that this was discontinued at her last appointment, she had forgotten. She was also referred to Dr. Weber Cooks for ECT, patient stated she had not heard from the office. I called [facility] and spoke to Adamson who informed me that the patient had been called twice and voicemail's were left for her. They are going to try again today. Patient says she can not wait to see you and needs to be seen immediatly. I told patient I would have you call. Thank you.

## 2017-08-12 NOTE — Telephone Encounter (Signed)
I recommend she go to behavioral health for a walk-in psychiatric admission.

## 2017-08-13 ENCOUNTER — Ambulatory Visit (HOSPITAL_COMMUNITY): Payer: 59 | Admitting: Psychiatry

## 2017-08-13 ENCOUNTER — Encounter (HOSPITAL_COMMUNITY): Payer: Self-pay | Admitting: Psychiatry

## 2017-08-13 VITALS — BP 126/90 | HR 90 | Ht 66.5 in | Wt 155.0 lb

## 2017-08-13 DIAGNOSIS — F4312 Post-traumatic stress disorder, chronic: Secondary | ICD-10-CM | POA: Diagnosis not present

## 2017-08-13 DIAGNOSIS — F603 Borderline personality disorder: Secondary | ICD-10-CM

## 2017-08-13 DIAGNOSIS — Z79899 Other long term (current) drug therapy: Secondary | ICD-10-CM

## 2017-08-13 DIAGNOSIS — F3181 Bipolar II disorder: Secondary | ICD-10-CM

## 2017-08-13 DIAGNOSIS — Z818 Family history of other mental and behavioral disorders: Secondary | ICD-10-CM

## 2017-08-13 MED ORDER — CLONAZEPAM 0.5 MG PO TABS: 0.5000 mg | ORAL_TABLET | Freq: Three times a day (TID) | ORAL | 2 refills | Status: DC | PRN

## 2017-08-13 MED ORDER — QUETIAPINE FUMARATE 100 MG PO TABS: 100.0000 mg | ORAL_TABLET | Freq: Every day | ORAL | 1 refills | Status: DC

## 2017-08-13 NOTE — Progress Notes (Signed)
[facility] MD/PA/NP OP Progress Note  08/13/2017 10:13 AM Sheryl Lester  MRN:  [id number]  Chief Complaint: I am doing horribly  HPI: Patient presents for earlier follow-up with complaints that she is doing horribly, she has so much anxiety, cannot get anything done.  She reports that the Belsomra is not working for her sleep.  She continues on Rexulti despite instructions to discontinue.  She has not contacted ECT clinic despite 3 left voicemails by the referral providers.  She reports that her husband is very opposed to ECT, and she wants to hold off for now.  I spent time with her discussing recommendations to consider hospitalization, and she continues to have passive suicidal thoughts.  She reports that she would never harm herself and knows that these are thoughts she can ignore.  I recommended she proceed with partial hospitalization program, and/or IOP depending on level of availability and appropriate disposition.  She was agreeable to this.  We agreed to continue the Tegretol and Pristiq, and to discontinue Rexulti.  We agreed to restart clonazepam 0.5 mg 3 times daily for severe ongoing anxiety associated with depression, and to initiate Seroquel 100 mg nightly for augmentation of Pristiq and sleep.  I reviewed the risks and benefits of neurocognitive disorders associated with clonazepam, and the risk of respiratory depression with high doses associated with benzodiazepines especially when prescribed with opiates.  I reviewed the risk of falls with clonazepam and Seroquel, and I reviewed the risk of movement disorder, TD, and metabolic side effects with Seroquel.  Patient was able to provide informed consent for the medication changes.  We will follow-up in 8 weeks as scheduled.  Visit Diagnosis:    ICD-10-CM   1. Chronic post-traumatic stress disorder (PTSD) F43.12   2. Bipolar 2 disorder ([place]) F31.81   3. Borderline personality disorder ([place]) F60.3     Past Psychiatric History: See  intake H&P for full details. Reviewed, with no updates at this time.   Past Medical History:  Past Medical History:  Diagnosis Date  . ALLERGIC RHINITIS 01/20/2008  . ANEMIA-IRON DEFICIENCY 01/20/2008  . ANXIETY 01/20/2008  . ASTHMATIC BRONCHITIS, ACUTE 05/19/2008  . Bipolar disorder ([place])   . CENTRAL SLEEP APNEA CONDS CLASSIFIED ELSEWHERE 08/31/2010  . Complication of anesthesia 1987   "w/one of the anesthesia agents that they don't use anymore; chest muscles constricted; felt like I couldn't breath"  . DEPRESSION 01/20/2008  . DYSPHAGIA UNSPECIFIED 12/06/2008  . ELEVATED BLOOD PRESSURE WITHOUT DIAGNOSIS OF HYPERTENSION 12/06/2008  . GERD   . History of sleep walking   . HYPERLIPIDEMIA 01/20/2008  . HYPERTENSION 12/16/2008  . Melanoma ([place]) 2012   "skin of my stomach"  . Migraine    "related to menses; went away after hysterectomy"  . OSA treated with BiPAP   . OTITIS MEDIA, ACUTE, BILATERAL 12/16/2008  . OTITIS MEDIA, SEROUS, CHRONIC 12/30/2008  . PVD 05/12/2010  . Pyelonephritis "hospitalized in ~ 1991"  . SINUSITIS- ACUTE-NOS 09/21/2008  . SWELLING MASS OR LUMP IN HEAD AND NECK 09/21/2008  . THYROID NODULE, LEFT    "unable to aspirate; has gotten very small"   . Trigeminal neuralgia 01/20/2008  . Unspecified hearing loss 09/21/2008  . VENOUS INSUFFICIENCY, LEGS 05/03/2010    Past Surgical History:  Procedure Laterality Date  . ABDOMINAL HYSTERECTOMY  2000  . APPENDECTOMY  2000  . BRAIN SURGERY    . BREAST BIOPSY Left 1994   Benign  . CEREBRAL MICROVASCULAR DECOMPRESSION  1999   for chronic  facial pain/ [facility]  . CHOLECYSTECTOMY N/A 12/13/2015   Procedure: LAPAROSCOPIC CHOLECYSTECTOMY;  Surgeon: Mickeal Skinner, MD;  Location: [facility];  Service: General;  Laterality: N/A;  . INGUINAL HERNIA REPAIR Right 1987  . MELANOMA EXCISION  2015   "skin of my stomach"  . OOPHORECTOMY Bilateral 2000    Family Psychiatric History: See intake H&P for full details. Reviewed, with no updates at  this time.   Family History:  Family History  Problem Relation Age of Onset  . Hyperlipidemia Mother   . Hypertension Mother   . Diabetes Father   . Melanoma Father   . Anxiety disorder Father   . Depression Father   . Melanoma Sister   . Anxiety disorder Sister   . Depression Sister   . Heart disease Paternal Grandmother   . Anxiety disorder Brother   . Depression Brother   . Breast cancer Paternal Aunt   . Heart disease Other        Mother side of family-several uncles   . Anxiety disorder Sister   . Depression Sister   . Colon cancer Neg Hx     Social History:  Social History   Socioeconomic History  . Marital status: Married    Spouse name: None  . Number of children: 0  . Years of education: None  . Highest education level: None  Social Needs  . Financial resource strain: None  . Food insecurity - worry: None  . Food insecurity - inability: None  . Transportation needs - medical: None  . Transportation needs - non-medical: None  Occupational History  . Occupation: disabled    Fish farm manager: UNEMPLOYED  Tobacco Use  . Smoking status: Never Smoker  . Smokeless tobacco: Never Used  Substance and Sexual Activity  . Alcohol use: No  . Drug use: No  . Sexual activity: Not Currently    Partners: Male    Birth control/protection: None  Other Topics Concern  . None  Social History Narrative  . None    Allergies:  Allergies  Allergen Reactions  . Aripiprazole Rash and Swelling  . Fentanyl     Other reaction(s): Other Sleep Walking  . Avapro [Irbesartan] Other (See Comments)    hyponatremia  . Sulfamethoxazole-Trimethoprim Nausea And Vomiting  . Bupropion Rash  . Ibuprofen Nausea And Vomiting    Metabolic Disorder Labs: Lab Results  Component Value Date   HGBA1C 5.6 09/16/2015   No results found for: PROLACTIN Lab Results  Component Value Date   CHOL 205 (H) 09/16/2015   TRIG 91.0 09/16/2015   HDL 86.00 09/16/2015   CHOLHDL 2 09/16/2015   VLDL  18.2 09/16/2015   LDLCALC 101 (H) 09/16/2015   LDLCALC 71 06/02/2012   Lab Results  Component Value Date   TSH 0.59 03/02/2016   TSH 0.86 06/14/2015    Therapeutic Level Labs: No results found for: LITHIUM No results found for: VALPROATE No components found for:  CBMZ  Current Medications: Current Outpatient Medications  Medication Sig Dispense Refill  . aspirin 81 MG EC tablet Take 81 mg by mouth every morning.     . Buprenorphine (BUTRANS) 20 MCG/HR PTWK Place 20 mcg onto the skin every Tuesday.     Marland Kitchen BUTRANS 10 MCG/HR PTWK patch Take 10 mcg by mouth daily as needed (for pain).   2  . desvenlafaxine (PRISTIQ) 100 MG 24 hr tablet Take 1 tablet (100 mg total) by mouth daily. 90 tablet 1  . EQUETRO 300 MG CP12  Take 600 mg by mouth 2 (two) times daily.   11  . metoprolol (LOPRESSOR) 100 MG tablet Take 100 mg by mouth 2 (two) times daily.    . NON FORMULARY at bedtime. BI-Pap    . NONFORMULARY OR COMPOUNDED ITEM Take 1 application by mouth 4 (four) times daily as needed. Topically on cheek and inside mouth of mouth to calm trigeminal nerve    . NUCYNTA 100 MG TABS Take 250 mg by mouth 3 (three) times daily.    . clonazePAM (KLONOPIN) 0.5 MG tablet Take 1 tablet (0.5 mg total) by mouth 3 (three) times daily as needed for anxiety. 90 tablet 2  . QUEtiapine (SEROQUEL) 100 MG tablet Take 1 tablet (100 mg total) by mouth at bedtime. 90 tablet 1   No current facility-administered medications for this visit.      Musculoskeletal: Strength & Muscle Tone: within normal limits Gait & Station: normal Patient leans: N/A  Psychiatric Specialty Exam: ROS  Blood pressure 126/90, pulse 90, height 5' 6.5" (1.689 m), weight 155 lb (70.3 kg), SpO2 95 %.Body mass index is 24.64 kg/m.  General Appearance: Casual and Fairly Groomed  Eye Contact:  Fair  Speech:  Clear and Coherent  Volume:  Normal  Mood:  Anxious and Depressed  Affect:  Appropriate and Congruent  Thought Process:  Goal  Directed and Descriptions of Associations: Intact  Orientation:  Full (Time, Place, and Person)  Thought Content: Logical   Suicidal Thoughts:  Yes.  without intent/plan  Homicidal Thoughts:  No  Memory:  Immediate;   Good  Judgement:  Good  Insight:  Shallow  Psychomotor Activity:  Normal  Concentration:  Concentration: Fair  Recall:  [employer] of Knowledge: Fair  Language: Fair  Akathisia:  Negative  Handed:  Right  AIMS (if indicated): not done  Assets:  Communication Skills Desire for Improvement Financial Resources/Insurance Housing Intimacy  ADL's:  Intact  Cognition: WNL  Sleep:  Poor   Screenings: AIMS     Office Visit from 07/16/2017 in [facility]  AIMS Total Score  0    PHQ2-9     Office Visit from 06/06/2016 in [facility] at [facility] from 03/17/2014 in [facility]  PHQ-2 Total Score  6  0  PHQ-9 Total Score  20  No data       Assessment and Plan:  MARCHELL FROMAN presents with ongoing anxious distress, poor frustration tolerance, and passive thoughts of suicide.  We agreed to proceed as below regarding medication changes and I will make a referral for her to the PHP and/or IOP programs.  Patient remains a chronically elevated risk of self-harm and suicide given comorbid mood and personality disorder.  I have educated her on the risks of benzodiazepine use including addictive potential, risk of dementia, risk of falls, respiratory depression, and the risks and benefits of atypical antipsychotic including metabolic and TD.  1. Chronic post-traumatic stress disorder (PTSD)   2. Bipolar 2 disorder ([place])   3. Borderline personality disorder ([place])     Status of current problems: unchanged  Labs Ordered: No orders of the defined types were placed in this encounter.   Labs Reviewed: NA  Collateral Obtained/Records Reviewed: N/A  Plan:  Referral to PHP and/or IOP -I would  suggest PHP but this may be challenging given the patient's obligations to her family Clonazepam 0.5 mg 3 times daily Continue Pristiq and Equetro Discontinue Rexulti Initiate Seroquel 100  mg nightly for sleep and antidepressant augmentation  I spent 25 minutes with the patient in direct face-to-face clinical care.  Greater than 50% of this time was spent in counseling and coordination of care with the patient.    Aundra Dubin, MD 08/13/2017, 10:13 AM

## 2017-08-13 NOTE — Patient Instructions (Addendum)
Continue Pristiq and Continue Equetro  Start clonazepam 0.5 mg 3 times a day - NO more than 3 times a day, but you can use 2 tablets in the morning and 1 at night, or 2 tablets at night and 1 in the morning, etc  Seroquel 100 mg at night  STOP Belsomra and STOP Rexulti

## 2017-08-22 ENCOUNTER — Ambulatory Visit: Payer: 59 | Admitting: Psychology

## 2017-08-26 ENCOUNTER — Telehealth (HOSPITAL_COMMUNITY): Payer: Self-pay | Admitting: Psychiatry

## 2017-08-26 NOTE — Telephone Encounter (Signed)
D:  Pt had called writer and left vm inquiring about PHP/IOP.  States she recently saw Dr. Daron Offer, who is recommending group.  Returned pt's call.  Informed pt, that both treatment teams along with Dr. Lovena Le feels that the groups wouldn't be conducive at this time.  Pt reports she is really wanting a group that incorporates a lot of DBT.  Denies SI/HI or A/V hallucinations.  A:  Provided pt with [facility]'s phone number and encouraged her to call them.  R:  Pt receptive.

## 2017-09-02 ENCOUNTER — Ambulatory Visit (INDEPENDENT_AMBULATORY_CARE_PROVIDER_SITE_OTHER): Payer: 59 | Admitting: Psychology

## 2017-09-02 DIAGNOSIS — F319 Bipolar disorder, unspecified: Secondary | ICD-10-CM

## 2017-09-12 ENCOUNTER — Ambulatory Visit: Payer: 59 | Admitting: Psychology

## 2017-09-18 ENCOUNTER — Ambulatory Visit: Payer: 59 | Admitting: Psychology

## 2017-09-18 DIAGNOSIS — F319 Bipolar disorder, unspecified: Secondary | ICD-10-CM | POA: Diagnosis not present

## 2017-09-25 ENCOUNTER — Ambulatory Visit: Payer: 59 | Admitting: Psychology

## 2017-10-03 ENCOUNTER — Ambulatory Visit (INDEPENDENT_AMBULATORY_CARE_PROVIDER_SITE_OTHER): Payer: 59 | Admitting: Psychology

## 2017-10-03 DIAGNOSIS — F319 Bipolar disorder, unspecified: Secondary | ICD-10-CM | POA: Diagnosis not present

## 2017-10-07 ENCOUNTER — Ambulatory Visit: Payer: 59 | Admitting: Psychology

## 2017-10-08 ENCOUNTER — Encounter (HOSPITAL_COMMUNITY): Payer: Self-pay | Admitting: Psychiatry

## 2017-10-08 ENCOUNTER — Ambulatory Visit (INDEPENDENT_AMBULATORY_CARE_PROVIDER_SITE_OTHER): Payer: 59 | Admitting: Psychiatry

## 2017-10-08 VITALS — BP 118/78 | HR 68 | Wt 161.0 lb

## 2017-10-08 DIAGNOSIS — F332 Major depressive disorder, recurrent severe without psychotic features: Secondary | ICD-10-CM

## 2017-10-08 DIAGNOSIS — F4312 Post-traumatic stress disorder, chronic: Secondary | ICD-10-CM | POA: Diagnosis not present

## 2017-10-08 DIAGNOSIS — F603 Borderline personality disorder: Secondary | ICD-10-CM | POA: Diagnosis not present

## 2017-10-08 DIAGNOSIS — Z818 Family history of other mental and behavioral disorders: Secondary | ICD-10-CM

## 2017-10-08 DIAGNOSIS — Z79899 Other long term (current) drug therapy: Secondary | ICD-10-CM

## 2017-10-08 DIAGNOSIS — G500 Trigeminal neuralgia: Secondary | ICD-10-CM

## 2017-10-08 MED ORDER — QUETIAPINE FUMARATE 200 MG PO TABS: 200.0000 mg | ORAL_TABLET | Freq: Every day | ORAL | 1 refills | Status: DC

## 2017-10-08 MED ORDER — CLONAZEPAM 0.5 MG PO TABS: 0.5000 mg | ORAL_TABLET | Freq: Three times a day (TID) | ORAL | 2 refills | Status: DC | PRN

## 2017-10-08 MED ORDER — DESVENLAFAXINE SUCCINATE ER 100 MG PO TB24: 100.0000 mg | ORAL_TABLET | Freq: Every day | ORAL | 1 refills | Status: DC

## 2017-10-08 NOTE — Progress Notes (Signed)
[facility] [facility] MD/PA/NP OP Progress Note  10/08/2017 3:01 PM Sheryl Lester  MRN:  [id number]  Chief Complaint: Verne Carrow HPI: Sheryl Lester continues to perseverate on negative aspects of her life.  I attempted to do an exercise with her of naming 3 good things that have happened to her over the past week.  She was only able to partially participate.  She continues to externalize much of her stressors.  Reports that she feels overwhelmed, depressed, dysphoric.  She denies any suicidal thoughts.  She feels negative often.  She feels like her energy is low and she has low motivation to leave the home.  She continues on Seroquel 200 mg, Pristiq, and clonazepam twice a day.  She continues to perseverate on wanting stimulant added back to her regimen.  I spent time with her reflecting that this had not provided her with any actual change in her day-to-day abilities, nor did she endorse any internal improvement of her mood, and it is quite a toxic medication to prescribe in context of [place] and clonazepam.  I educated her on the efficacy of [place] for treatment resistant depression.  We reviewed the risks and benefits including the risk of seizure, and particularly in her case trigeminal neuralgia pain.  She has trigeminal neuralgia on her right side.  We agreed to proceed with preauthorization, and I provided her educational material to review.  She does not have any implanted devices or any medical contraindications to left-sided repetitive [place] treatment.  Visit Diagnosis:    ICD-10-CM   1. Severe episode of recurrent major depressive disorder, without psychotic features ([place]) F33.2   2. Borderline personality disorder ([place]) F60.3 desvenlafaxine (PRISTIQ) 100 MG 24 hr tablet  3. Chronic post-traumatic stress disorder (PTSD) F43.12 desvenlafaxine (PRISTIQ) 100 MG 24 hr tablet    Past Psychiatric History: See intake H&P for full details. Reviewed, with no updates at this time.   Past Medical History:   Past Medical History:  Diagnosis Date  . ALLERGIC RHINITIS 01/20/2008  . ANEMIA-IRON DEFICIENCY 01/20/2008  . ANXIETY 01/20/2008  . ASTHMATIC BRONCHITIS, ACUTE 05/19/2008  . Bipolar disorder ([place])   . CENTRAL SLEEP APNEA CONDS CLASSIFIED ELSEWHERE 08/31/2010  . Complication of anesthesia 1987   "w/one of the anesthesia agents that they don't use anymore; chest muscles constricted; felt like I couldn't breath"  . DEPRESSION 01/20/2008  . DYSPHAGIA UNSPECIFIED 12/06/2008  . ELEVATED BLOOD PRESSURE WITHOUT DIAGNOSIS OF HYPERTENSION 12/06/2008  . GERD   . History of sleep walking   . HYPERLIPIDEMIA 01/20/2008  . HYPERTENSION 12/16/2008  . Melanoma ([place]) 2012   "skin of my stomach"  . Migraine    "related to menses; went away after hysterectomy"  . OSA treated with BiPAP   . OTITIS MEDIA, ACUTE, BILATERAL 12/16/2008  . OTITIS MEDIA, SEROUS, CHRONIC 12/30/2008  . PVD 05/12/2010  . Pyelonephritis "hospitalized in ~ 1991"  . SINUSITIS- ACUTE-NOS 09/21/2008  . SWELLING MASS OR LUMP IN HEAD AND NECK 09/21/2008  . THYROID NODULE, LEFT    "unable to aspirate; has gotten very small"   . Trigeminal neuralgia 01/20/2008  . Unspecified hearing loss 09/21/2008  . VENOUS INSUFFICIENCY, LEGS 05/03/2010    Past Surgical History:  Procedure Laterality Date  . ABDOMINAL HYSTERECTOMY  2000  . APPENDECTOMY  2000  . BRAIN SURGERY    . BREAST BIOPSY Left 1994   Benign  . 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 CHOLECYSTECTOMY N/A 12/13/2015  Procedure: LAPAROSCOPIC CHOLECYSTECTOMY;  Surgeon: Mickeal Skinner, MD;  Location: [facility];  Service: General;  Laterality: N/A;  . INGUINAL HERNIA REPAIR Right 1987  . MELANOMA EXCISION  2015   "skin of my stomach"  . OOPHORECTOMY Bilateral 2000    Family Psychiatric History: See intake H&P for full details. Reviewed, with no updates at this time.   Family History:  Family History  Problem Relation Age of Onset  .  Hyperlipidemia Mother   . Hypertension Mother   . Diabetes Father   . Melanoma Father   . Anxiety disorder Father   . Depression Father   . Melanoma Sister   . Anxiety disorder Sister   . Depression Sister   . Heart disease Paternal Grandmother   . Anxiety disorder Brother   . Depression Brother   . Breast cancer Paternal Aunt   . Heart disease Other        Mother side of family-several uncles   . Anxiety disorder Sister   . Depression Sister   . Colon cancer Neg Hx     Social History:  Social History   Socioeconomic History  . Marital status: Married    Spouse name: None  . Number of children: 0  . Years of education: None  . Highest education level: None  Social Needs  . Financial resource strain: None  . Food insecurity - worry: None  . Food insecurity - inability: None  . Transportation needs - medical: None  . Transportation needs - non-medical: None  Occupational History  . Occupation: disabled    Fish farm manager: UNEMPLOYED  Tobacco Use  . Smoking status: Never Smoker  . Smokeless tobacco: Never Used  Substance and Sexual Activity  . Alcohol use: No  . Drug use: No  . Sexual activity: Not Currently    Partners: Male    Birth control/protection: None  Other Topics Concern  . None  Social History Narrative  . None    Allergies:  Allergies  Allergen Reactions  . Aripiprazole Rash and Swelling  . Fentanyl     Other reaction(s): Other Sleep Walking  . Avapro [Irbesartan] Other (See Comments)    hyponatremia  . Sulfamethoxazole-Trimethoprim Nausea And Vomiting  . Bupropion Rash  . Ibuprofen Nausea And Vomiting    Metabolic Disorder Labs: Lab Results  Component Value Date   HGBA1C 5.6 09/16/2015   No results found for: PROLACTIN Lab Results  Component Value Date   CHOL 205 (H) 09/16/2015   TRIG 91.0 09/16/2015   HDL 86.00 09/16/2015   CHOLHDL 2 09/16/2015   VLDL 18.2 09/16/2015   LDLCALC 101 (H) 09/16/2015   LDLCALC 71 06/02/2012   Lab  Results  Component Value Date   TSH 0.59 03/02/2016   TSH 0.86 06/14/2015    Therapeutic Level Labs: No results found for: LITHIUM No results found for: VALPROATE No components found for:  CBMZ  Current Medications: Current Outpatient Medications  Medication Sig Dispense Refill  . aspirin 81 MG EC tablet Take 81 mg by mouth every morning.     . Buprenorphine (BUTRANS) 20 MCG/HR PTWK Place 20 mcg onto the skin every Tuesday.     Marland Kitchen BUTRANS 10 MCG/HR PTWK patch Take 10 mcg by mouth daily as needed (for pain).   2  . clonazePAM (KLONOPIN) 0.5 MG tablet Take 1 tablet (0.5 mg total) by mouth 3 (three) times daily as needed for anxiety. 90 tablet 2  . desvenlafaxine (PRISTIQ) 100 MG 24 hr tablet Take 1  tablet (100 mg total) by mouth daily. 90 tablet 1  . EQUETRO 300 MG CP12 Take 600 mg by mouth 2 (two) times daily.   11  . metoprolol (LOPRESSOR) 100 MG tablet Take 100 mg by mouth 2 (two) times daily.    . NON FORMULARY at bedtime. BI-Pap    . NONFORMULARY OR COMPOUNDED ITEM Take 1 application by mouth 4 (four) times daily as needed. Topically on cheek and inside mouth of mouth to calm trigeminal nerve    . NUCYNTA 100 MG TABS Take 250 mg by mouth 3 (three) times daily.    . QUEtiapine (SEROQUEL) 200 MG tablet Take 1 tablet (200 mg total) by mouth at bedtime. 90 tablet 1   No current facility-administered medications for this visit.      Musculoskeletal: Strength & Muscle Tone: within normal limits Gait & Station: normal Patient leans: N/A  Psychiatric Specialty Exam: ROS  Blood pressure 118/78, pulse 68, weight 161 lb (73 kg).Body mass index is 25.6 kg/m.  General Appearance: Casual and Well Groomed  Eye Contact:  Good  Speech:  Clear and Coherent  Volume:  Normal  Mood:  Dysphoric  Affect:  Congruent  Thought Process:  Goal Directed and Descriptions of Associations: Intact  Orientation:  Full (Time, Place, and Person)  Thought Content: Logical and Rumination   Suicidal  Thoughts:  No  Homicidal Thoughts:  No  Memory:  Immediate;   Good  Judgement:  Fair  Insight:  Shallow  Psychomotor Activity:  Normal  Concentration:  Concentration: Good  Recall:  Good  Fund of Knowledge: Good  Language: Good  Akathisia:  Negative  Handed:  Right  AIMS (if indicated): not done  Assets:  Communication Skills Desire for Improvement Financial Resources/Insurance Housing Social Support Transportation Vocational/Educational  ADL's:  Intact  Cognition: WNL  Sleep:  Good   Screenings: AIMS     Office Visit from 07/16/2017 in [facility]  AIMS Total Score  0    PHQ2-9     Office Visit from 06/06/2016 in Primary [facility] at [facility] from 03/17/2014 in [facility]  PHQ-2 Total Score  6  0  PHQ-9 Total Score  20  No data       Assessment and Plan:  Sheryl Lester presents for medication management follow-up.  She continues to express external locus of control, but is more able to participate in a discussion of her internal feelings and internal emotional experience.  She continues to present with severe depressive symptoms, and I spent time reviewing the risks and benefits of [place].  She is agreeable to proceed with preauthorization and consider treatment with [facility].  1. Severe episode of recurrent major depressive disorder, without psychotic features ([place])   2. Borderline personality disorder ([place])   3. Chronic post-traumatic stress disorder (PTSD)     Status of current problems: unchanged  Labs Ordered: No orders of the defined types were placed in this encounter.   Labs Reviewed: N/A  Collateral Obtained/Records Reviewed: N/A  Plan:  Proceed with [place] preauthorization Continue Pristiq 100 mg, Seroquel 200 mg, and clonazepam 0.5 mg 3 times daily Patient had an intake assessment at [facility] counseling today for DBT Follow-up in 2 months  I spent 25 minutes with the patient in  direct face-to-face clinical care.  Greater than 50% of this time was spent in counseling and coordination of care with the patient.    Aundra Dubin, MD 10/08/2017, 3:01  PM

## 2017-10-09 ENCOUNTER — Ambulatory Visit: Payer: Self-pay | Admitting: Psychology

## 2017-10-11 ENCOUNTER — Ambulatory Visit: Payer: 59 | Admitting: Psychology

## 2017-10-11 DIAGNOSIS — F319 Bipolar disorder, unspecified: Secondary | ICD-10-CM

## 2017-10-14 ENCOUNTER — Ambulatory Visit: Payer: 59 | Admitting: Psychology

## 2017-10-14 DIAGNOSIS — F319 Bipolar disorder, unspecified: Secondary | ICD-10-CM | POA: Diagnosis not present

## 2017-10-22 ENCOUNTER — Ambulatory Visit: Payer: 59 | Admitting: Psychology

## 2017-10-22 DIAGNOSIS — F319 Bipolar disorder, unspecified: Secondary | ICD-10-CM

## 2017-10-28 ENCOUNTER — Ambulatory Visit: Payer: 59 | Admitting: Psychology

## 2017-11-06 ENCOUNTER — Ambulatory Visit (INDEPENDENT_AMBULATORY_CARE_PROVIDER_SITE_OTHER): Payer: 59 | Admitting: Psychology

## 2017-11-06 DIAGNOSIS — F319 Bipolar disorder, unspecified: Secondary | ICD-10-CM | POA: Diagnosis not present

## 2017-11-18 ENCOUNTER — Ambulatory Visit: Payer: 59 | Admitting: Psychology

## 2017-12-05 ENCOUNTER — Ambulatory Visit (HOSPITAL_COMMUNITY): Payer: 59 | Admitting: Psychiatry

## 2017-12-05 ENCOUNTER — Encounter (HOSPITAL_COMMUNITY): Payer: Self-pay | Admitting: Psychiatry

## 2017-12-05 VITALS — BP 128/74 | HR 92 | Ht 67.0 in | Wt 162.0 lb

## 2017-12-05 DIAGNOSIS — F603 Borderline personality disorder: Secondary | ICD-10-CM

## 2017-12-05 DIAGNOSIS — Z818 Family history of other mental and behavioral disorders: Secondary | ICD-10-CM | POA: Diagnosis not present

## 2017-12-05 DIAGNOSIS — Z736 Limitation of activities due to disability: Secondary | ICD-10-CM | POA: Diagnosis not present

## 2017-12-05 DIAGNOSIS — F3181 Bipolar II disorder: Secondary | ICD-10-CM | POA: Diagnosis not present

## 2017-12-05 DIAGNOSIS — Z590 Homelessness: Secondary | ICD-10-CM | POA: Diagnosis not present

## 2017-12-05 DIAGNOSIS — F4312 Post-traumatic stress disorder, chronic: Secondary | ICD-10-CM

## 2017-12-05 NOTE — Patient Instructions (Addendum)
Dr. Creig Hines at [place]  Dr. Chucky May at [facility]   Dr. Corena Pilgrim at [facility]

## 2017-12-05 NOTE — Progress Notes (Signed)
[facility] MD/PA/NP OP Progress Note  12/05/2017 4:06 PM Sheryl Lester  MRN:  [id number]  Chief Complaint: Verne Carrow, nothing is changed HPI: Sheryl Lester reports that she is still miserable, nothing is changed, and she is thinking she might need a different psychiatrist and a different set of opinion for medication management.  She was friendly and amenable to some recommendations.  I provided her the name of multiple providers in the community and suggested she also coordinate with her PPO for additional options.  She has not started therapy at [facility] counseling and has a myriad of reasons why this is not working for her even though she has only been to 2 total sessions.  She continues with external locus of control, and an ongoing pessimistic outlook.  She continues to seek medications which I have made clear to her I am not comfortable with prescribing in her care.  Visit Diagnosis:    ICD-10-CM   1. Borderline personality disorder ([place]) F60.3   2. Chronic post-traumatic stress disorder (PTSD) F43.12   3. Bipolar 2 disorder ([place]) F31.81     Past Psychiatric History: See intake H&P for full details. Reviewed, with no updates at this time.   Past Medical History:  Past Medical History:  Diagnosis Date  . ALLERGIC RHINITIS 01/20/2008  . ANEMIA-IRON DEFICIENCY 01/20/2008  . ANXIETY 01/20/2008  . ASTHMATIC BRONCHITIS, ACUTE 05/19/2008  . Bipolar disorder ([place])   . CENTRAL SLEEP APNEA CONDS CLASSIFIED ELSEWHERE 08/31/2010  . Complication of anesthesia 1987   "w/one of the anesthesia agents that they don't use anymore; chest muscles constricted; felt like I couldn't breath"  . DEPRESSION 01/20/2008  . DYSPHAGIA UNSPECIFIED 12/06/2008  . ELEVATED BLOOD PRESSURE WITHOUT DIAGNOSIS OF HYPERTENSION 12/06/2008  . GERD   . History of sleep walking   . HYPERLIPIDEMIA 01/20/2008  . HYPERTENSION 12/16/2008  . Melanoma ([place]) 2012   "skin of my stomach"  . Migraine    "related to menses; went away  after hysterectomy"  . OSA treated with BiPAP   . OTITIS MEDIA, ACUTE, BILATERAL 12/16/2008  . OTITIS MEDIA, SEROUS, CHRONIC 12/30/2008  . PVD 05/12/2010  . Pyelonephritis "hospitalized in ~ 1991"  . SINUSITIS- ACUTE-NOS 09/21/2008  . SWELLING MASS OR LUMP IN HEAD AND NECK 09/21/2008  . THYROID NODULE, LEFT    "unable to aspirate; has gotten very small"   . Trigeminal neuralgia 01/20/2008  . Unspecified hearing loss 09/21/2008  . VENOUS INSUFFICIENCY, LEGS 05/03/2010    Past Surgical History:  Procedure Laterality Date  . ABDOMINAL HYSTERECTOMY  2000  . APPENDECTOMY  2000  . BRAIN SURGERY    . BREAST BIOPSY Left 1994   Benign  . 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 CHOLECYSTECTOMY N/A 12/13/2015   Procedure: LAPAROSCOPIC CHOLECYSTECTOMY;  Surgeon: Mickeal Skinner, MD;  Location: [facility];  Service: General;  Laterality: N/A;  . INGUINAL HERNIA REPAIR Right 1987  . MELANOMA EXCISION  2015   "skin of my stomach"  . OOPHORECTOMY Bilateral 2000    Family Psychiatric History: See intake H&P for full details. Reviewed, with no updates at this time.   Family History:  Family History  Problem Relation Age of Onset  . Hyperlipidemia Mother   . Hypertension Mother   . Diabetes Father   . Melanoma Father   . Anxiety disorder Father   . Depression Father   . Melanoma Sister   . Anxiety disorder Sister   . Depression  Sister   . Heart disease Paternal Grandmother   . Anxiety disorder Brother   . Depression Brother   . Breast cancer Paternal Aunt   . Heart disease Other        Mother side of family-several uncles   . Anxiety disorder Sister   . Depression Sister   . Colon cancer Neg Hx     Social History:  Social History   Socioeconomic History  . Marital status: Married    Spouse name: Not on file  . Number of children: 0  . Years of education: Not on file  . Highest education level: Not on file  Occupational History  . Occupation:  disabled    Fish farm manager: UNEMPLOYED  Social Needs  . Financial resource strain: Not on file  . Food insecurity:    Worry: Not on file    Inability: Not on file  . Transportation needs:    Medical: Not on file    Non-medical: Not on file  Tobacco Use  . Smoking status: Never Smoker  . Smokeless tobacco: Never Used  Substance and Sexual Activity  . Alcohol use: No  . Drug use: No  . Sexual activity: Not Currently    Partners: Male    Birth control/protection: None  Lifestyle  . Physical activity:    Days per week: Not on file    Minutes per session: Not on file  . Stress: Not on file  Relationships  . Social connections:    Talks on phone: Not on file    Gets together: Not on file    Attends religious service: Not on file    Active member of club or organization: Not on file    Attends meetings of clubs or organizations: Not on file    Relationship status: Not on file  Other Topics Concern  . Not on file  Social History Narrative  . Not on file    Allergies:  Allergies  Allergen Reactions  . Aripiprazole Rash and Swelling  . Fentanyl     Other reaction(s): Other Sleep Walking  . Avapro [Irbesartan] Other (See Comments)    hyponatremia  . Sulfamethoxazole-Trimethoprim Nausea And Vomiting  . Bupropion Rash  . Ibuprofen Nausea And Vomiting    Metabolic Disorder Labs: Lab Results  Component Value Date   HGBA1C 5.6 09/16/2015   No results found for: PROLACTIN Lab Results  Component Value Date   CHOL 205 (H) 09/16/2015   TRIG 91.0 09/16/2015   HDL 86.00 09/16/2015   CHOLHDL 2 09/16/2015   VLDL 18.2 09/16/2015   LDLCALC 101 (H) 09/16/2015   LDLCALC 71 06/02/2012   Lab Results  Component Value Date   TSH 0.59 03/02/2016   TSH 0.86 06/14/2015    Therapeutic Level Labs: No results found for: LITHIUM No results found for: VALPROATE No components found for:  CBMZ  Current Medications: Current Outpatient Medications  Medication Sig Dispense Refill  .  aspirin 81 MG EC tablet Take 81 mg by mouth every morning.     . Buprenorphine (BUTRANS) 20 MCG/HR PTWK Place 20 mcg onto the skin every Tuesday.     Marland Kitchen BUTRANS 10 MCG/HR PTWK patch Take 10 mcg by mouth daily as needed (for pain).   2  . clonazePAM (KLONOPIN) 0.5 MG tablet Take 1 tablet (0.5 mg total) by mouth 3 (three) times daily as needed for anxiety. 90 tablet 2  . desvenlafaxine (PRISTIQ) 100 MG 24 hr tablet Take 1 tablet (100 mg total) by mouth  daily. 90 tablet 1  . EQUETRO 300 MG CP12 Take 600 mg by mouth 2 (two) times daily.   11  . metoprolol (LOPRESSOR) 100 MG tablet Take 100 mg by mouth 2 (two) times daily.    . NON FORMULARY at bedtime. BI-Pap    . NONFORMULARY OR COMPOUNDED ITEM Take 1 application by mouth 4 (four) times daily as needed. Topically on cheek and inside mouth of mouth to calm trigeminal nerve    . NUCYNTA 100 MG TABS Take 250 mg by mouth 3 (three) times daily.    . QUEtiapine (SEROQUEL) 200 MG tablet Take 1 tablet (200 mg total) by mouth at bedtime. 90 tablet 1   No current facility-administered medications for this visit.      Musculoskeletal: Strength & Muscle Tone: within normal limits Gait & Station: normal Patient leans: N/A  Psychiatric Specialty Exam: ROS  Blood pressure 128/74, pulse 92, height _0  (1.702 m), weight 162 lb (73.5 kg).Body mass index is 25.37 kg/m.  General Appearance: Casual and Fairly Groomed  Eye Contact:  Fair  Speech:  Clear and Coherent and Normal Rate  Volume:  Normal  Mood:  Anxious and Depressed  Affect:  Non-Congruent  Thought Process:  Coherent, Goal Directed and Descriptions of Associations: Intact  Orientation:  Full (Time, Place, and Person)  Thought Content: Logical   Suicidal Thoughts:  No  Homicidal Thoughts:  No  Memory:  Immediate;   Fair  Judgement:  Fair  Insight:  Lacking  Psychomotor Activity:  Normal  Concentration:  Concentration: Fair  Recall:  [employer] of Knowledge: Fair  Language: Fair   Akathisia:  Negative  Handed:  Right  AIMS (if indicated): not done  Assets:  Neurosurgeon  ADL's:  Intact  Cognition: WNL  Sleep:  Fair   Screenings: AIMS     Office Visit from 07/16/2017 in [facility]  AIMS Total Score  0    PHQ2-9     Office Visit from 06/06/2016 in Primary Care at [facility] from 03/17/2014 in [facility]  PHQ-2 Total Score  6  0  PHQ-9 Total Score  20  -       Assessment and Plan: Sheryl Lester presents with ongoing chronic depression, very poor insight, and limited participation in therapeutic interventions.  I offered her [place] at her last visit and she became upset when the [facility] team reached out to her to obtain some preauthorization information, and felt like it was "a sales pitch".  I expressed to her that it seems like she is uncomfortable with her care at [facility], and she admits that she is interested in getting a new psychiatrist because she feels like her mood and energy have not changed for the better.   We have tried a myriad of pharmacologic approaches in our treatment time together, and the patient's primary issue continues to be severe borderline personality disorder, with very poor insight and participation and therapeutic interventions.   I provided her with a list of a few names and suggested she coordinate with her insurance company for additional referrals.  We will allow for the patient to schedule one additional follow-up to make sure that she has adequate transition of care.  No other acute concerns or safety issues at this time.  1. Borderline personality disorder ([place])   2. Chronic post-traumatic stress disorder (PTSD)   3. Bipolar 2 disorder ([facility])  Status of current problems: unchanged  Labs Ordered: No orders of the defined types were placed in this encounter.   Labs Reviewed: N/A  Collateral  Obtained/Records Reviewed: N/A  Plan:  Patient is not interested in [place] at this time, if she becomes interested, we can refer her to other [facility] clinics Continue clonazepam, Pristiq, Seroquel 1 additional follow-up in the interim, and thereafter patient will be dismissed from practice per her request  I spent 20 minutes with the patient in direct face-to-face clinical care.  Greater than 50% of this time was spent in counseling and coordination of care with the patient.    Aundra Dubin, MD 12/05/2017, 4:06 PM

## 2017-12-09 ENCOUNTER — Ambulatory Visit: Payer: Self-pay | Admitting: Psychology

## 2017-12-11 ENCOUNTER — Ambulatory Visit: Payer: 59 | Admitting: Psychology

## 2017-12-11 DIAGNOSIS — F319 Bipolar disorder, unspecified: Secondary | ICD-10-CM | POA: Diagnosis not present

## 2017-12-29 ENCOUNTER — Other Ambulatory Visit: Payer: Self-pay | Admitting: Internal Medicine

## 2018-01-22 ENCOUNTER — Other Ambulatory Visit: Payer: Self-pay | Admitting: Internal Medicine

## 2018-01-28 ENCOUNTER — Ambulatory Visit: Payer: PRIVATE HEALTH INSURANCE | Admitting: Family

## 2018-01-28 ENCOUNTER — Encounter: Payer: Self-pay | Admitting: Family

## 2018-01-28 VITALS — BP 132/84 | HR 100 | Temp 98.6°F | Ht 67.0 in | Wt 162.1 lb

## 2018-01-28 DIAGNOSIS — I10 Essential (primary) hypertension: Secondary | ICD-10-CM | POA: Diagnosis not present

## 2018-01-28 DIAGNOSIS — Z1231 Encounter for screening mammogram for malignant neoplasm of breast: Secondary | ICD-10-CM

## 2018-01-28 DIAGNOSIS — Z23 Encounter for immunization: Secondary | ICD-10-CM | POA: Diagnosis not present

## 2018-01-28 MED ORDER — METOPROLOL TARTRATE 50 MG PO TABS: 50.0000 mg | ORAL_TABLET | Freq: Two times a day (BID) | ORAL | 0 refills | Status: DC

## 2018-01-28 NOTE — Progress Notes (Signed)
Sheryl Lester is a 62 y.o. female with the following history as recorded in EpicCare:  Patient Active Problem List   Diagnosis Date Noted  . Sleep apnea 11/20/2016  . Bipolar disorder, in partial remission, most recent episode depressed ([place]) 11/20/2016  . Bipolar 1 disorder, depressed, moderate ([place]) 05/27/2016  . Rash and nonspecific skin eruption 03/21/2016  . Encounter for therapeutic drug monitoring 03/02/2016  . Cholecystitis with cholelithiasis 12/12/2015  . 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 Chronic pain in face 03/25/2015  . 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 Migraine with aura, intractable 04/03/2013  . Dysesthesia 02/19/2013  . Osteopenia 12/23/2012  . Intractable migraine 11/14/2012  . Skin lesion 09/23/2012  . Hearing loss 09/23/2012  . 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 Atypical facial pain 05/30/2012  . Facial pain 05/29/2012  . Spells 11/23/2011  . Chronic interstitial cystitis 11/14/2011  . Parasomnia 10/01/2011  . Melanoma ([place]) 07/05/2011  . Preventative health care 03/01/2011  . PVD 05/12/2010  . Essential hypertension 12/16/2008  . TIA 12/16/2008  . GERD 12/06/2008  . Unspecified hearing loss 09/21/2008  . HYPERLIPIDEMIA 01/20/2008  . ANEMIA-IRON DEFICIENCY 01/20/2008  . Trigeminal neuralgia 01/20/2008  . ALLERGIC RHINITIS 01/20/2008    Current Outpatient Medications  Medication Sig Dispense Refill  . Buprenorphine (BUTRANS) 20 MCG/HR PTWK Place 20 mcg onto the skin every Tuesday.     Marland Kitchen BUTRANS 10 MCG/HR PTWK patch Take 10 mcg by mouth daily as needed (for pain).   2  . clonazePAM (KLONOPIN) 0.5 MG tablet Take 1 tablet (0.5 mg total) by mouth 3 (three) times daily as needed for anxiety. 90 tablet 2  . desvenlafaxine (PRISTIQ) 100 MG 24 hr tablet Take 1 tablet (100 mg total) by mouth daily. 90 tablet 1  . EQUETRO 300 MG CP12 Take 600 mg by mouth 2 (two) times daily.   11  . NON FORMULARY at bedtime. BI-Pap    . NONFORMULARY OR  COMPOUNDED ITEM Take 1 application by mouth 4 (four) times daily as needed. Topically on cheek and inside mouth of mouth to calm trigeminal nerve    . NUCYNTA 100 MG TABS Take 250 mg by mouth 3 (three) times daily.    . QUEtiapine (SEROQUEL) 200 MG tablet Take 1 tablet (200 mg total) by mouth at bedtime. 90 tablet 1  . Tapentadol HCl (NUCYNTA) 100 MG TABS Take by mouth.    . metoprolol tartrate (LOPRESSOR) 50 MG tablet Take 1 tablet (50 mg total) by mouth 2 (two) times daily. 180 tablet 0   No current facility-administered medications for this visit.     Allergies: Aripiprazole; Fentanyl; Avapro [irbesartan]; Sulfamethoxazole-trimethoprim; Bupropion; and Ibuprofen  Past Medical History:  Diagnosis Date  . ALLERGIC RHINITIS 01/20/2008  . ANEMIA-IRON DEFICIENCY 01/20/2008  . ANXIETY 01/20/2008  . ASTHMATIC BRONCHITIS, ACUTE 05/19/2008  . Bipolar disorder ([place])   . CENTRAL SLEEP APNEA CONDS CLASSIFIED ELSEWHERE 08/31/2010  . Complication of anesthesia 1987   "w/one of the anesthesia agents that they don't use anymore; chest muscles constricted; felt like I couldn't breath"  . DEPRESSION 01/20/2008  . DYSPHAGIA UNSPECIFIED 12/06/2008  . ELEVATED BLOOD PRESSURE WITHOUT DIAGNOSIS OF HYPERTENSION 12/06/2008  . GERD   . History of sleep walking   . HYPERLIPIDEMIA 01/20/2008  . HYPERTENSION 12/16/2008  . Melanoma ([place]) 2012   "skin of my stomach"  . Migraine    "related to menses; went away after hysterectomy"  . OSA treated with BiPAP   . OTITIS MEDIA, ACUTE, BILATERAL  12/16/2008  . OTITIS MEDIA, SEROUS, CHRONIC 12/30/2008  . PVD 05/12/2010  . Pyelonephritis "hospitalized in ~ 1991"  . SINUSITIS- ACUTE-NOS 09/21/2008  . SWELLING MASS OR LUMP IN HEAD AND NECK 09/21/2008  . THYROID NODULE, LEFT    "unable to aspirate; has gotten very small"   . Trigeminal neuralgia 01/20/2008  . Unspecified hearing loss 09/21/2008  . VENOUS INSUFFICIENCY, LEGS 05/03/2010    Past Surgical History:  Procedure Laterality  Date  . ABDOMINAL HYSTERECTOMY  2000  . APPENDECTOMY  2000  . BRAIN SURGERY    . BREAST BIOPSY Left 1994   Benign  . 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 CHOLECYSTECTOMY N/A 12/13/2015   Procedure: LAPAROSCOPIC CHOLECYSTECTOMY;  Surgeon: Mickeal Skinner, MD;  Location: [facility];  Service: General;  Laterality: N/A;  . INGUINAL HERNIA REPAIR Right 1987  . MELANOMA EXCISION  2015   "skin of my stomach"  . OOPHORECTOMY Bilateral 2000    Family History  Problem Relation Age of Onset  . Hyperlipidemia Mother   . Hypertension Mother   . Diabetes Father   . Melanoma Father   . Anxiety disorder Father   . Depression Father   . Melanoma Sister   . Anxiety disorder Sister   . Depression Sister   . Heart disease Paternal Grandmother   . Anxiety disorder Brother   . Depression Brother   . Breast cancer Paternal Aunt   . Heart disease Other        Mother side of family-several uncles   . Anxiety disorder Sister   . Depression Sister   . Colon cancer Neg Hx     Social History   Tobacco Use  . Smoking status: Never Smoker  . Smokeless tobacco: Never Used  Substance Use Topics  . Alcohol use: No    Subjective:  Patient is requesting a refill on her blood pressure medication- has been on Lopressor with no complications; doing well on medication; Denies any chest pain, shortness of breath, blurred vision or headache  Overdue to see her PCP- last saw her for blood pressure check in 2017; Does see her pain specialist and psychiatrist regularly and labs are being managed through those specialists;    Objective:  Vitals:   01/28/18 1006  BP: 132/84  Pulse: 100  Temp: 98.6 F (37 C)  TempSrc: Oral  SpO2: 95%  Weight: 162 lb 1.3 oz (73.5 kg)  Height: 5\' 7"  (1.702 m)    General: Well developed, well nourished, in no acute distress  Skin : Warm and dry.  Head: Normocephalic and atraumatic  Lungs: Respirations unlabored; clear to  auscultation bilaterally without wheeze, rales, rhonchi  CVS exam: normal rate and regular rhythm.  Neurologic: Alert and oriented; speech intact; face symmetrical; moves all extremities well; CNII-XII intact without focal deficit  Assessment:  1. Essential hypertension   2. Screening mammogram, encounter for     Plan:  1. Stable; refill updated x 3 months; she understands she must see her PCP for CPE to get more refills; 2. Order for mammogram updated.   Return for Needs CPE with Dr. Sharlet Salina within 3 months.  Orders Placed This Encounter  Procedures  . MM Digital Screening    Standing Status:   Future    Standing Expiration Date:   03/31/2019    Order Specific Question:   Reason for Exam (SYMPTOM  OR DIAGNOSIS REQUIRED)    Answer:   screening mammogram  Order Specific Question:   Is the patient pregnant?    Answer:   No    Order Specific Question:   Preferred imaging location?    Answer:   [facility] [place]  . Tdap vaccine greater than or equal to 60yo IM    Requested Prescriptions   Signed Prescriptions Disp Refills  . metoprolol tartrate (LOPRESSOR) 50 MG tablet 180 tablet 0    Sig: Take 1 tablet (50 mg total) by mouth 2 (two) times daily.

## 2018-02-04 ENCOUNTER — Ambulatory Visit (HOSPITAL_COMMUNITY): Payer: PRIVATE HEALTH INSURANCE | Admitting: Psychiatry

## 2018-02-04 ENCOUNTER — Encounter (HOSPITAL_COMMUNITY): Payer: Self-pay | Admitting: Psychiatry

## 2018-02-04 VITALS — BP 132/76 | HR 88 | Ht 67.0 in | Wt 163.0 lb

## 2018-02-04 DIAGNOSIS — F3181 Bipolar II disorder: Secondary | ICD-10-CM

## 2018-02-04 DIAGNOSIS — F4312 Post-traumatic stress disorder, chronic: Secondary | ICD-10-CM

## 2018-02-04 DIAGNOSIS — F419 Anxiety disorder, unspecified: Secondary | ICD-10-CM | POA: Diagnosis not present

## 2018-02-04 DIAGNOSIS — R45851 Suicidal ideations: Secondary | ICD-10-CM

## 2018-02-04 DIAGNOSIS — F603 Borderline personality disorder: Secondary | ICD-10-CM | POA: Diagnosis not present

## 2018-02-04 DIAGNOSIS — Z560 Unemployment, unspecified: Secondary | ICD-10-CM

## 2018-02-04 DIAGNOSIS — Z818 Family history of other mental and behavioral disorders: Secondary | ICD-10-CM

## 2018-02-04 MED ORDER — DESVENLAFAXINE SUCCINATE ER 100 MG PO TB24: 100.0000 mg | ORAL_TABLET | Freq: Every day | ORAL | 0 refills | Status: DC

## 2018-02-04 MED ORDER — CLONAZEPAM 0.5 MG PO TABS: 0.5000 mg | ORAL_TABLET | Freq: Three times a day (TID) | ORAL | 2 refills | Status: DC | PRN

## 2018-02-04 MED ORDER — QUETIAPINE FUMARATE 200 MG PO TABS: 200.0000 mg | ORAL_TABLET | Freq: Every day | ORAL | 0 refills | Status: DC

## 2018-02-04 NOTE — Progress Notes (Signed)
[facility] MD/PA/NP OP Progress Note  02/04/2018 3:16 PM Sheryl Lester  MRN:  [id number]  Chief Complaint: same, stable  HPI: Sheryl Lester reports today for her final visit.  She has a visit scheduled for a new patient intake in August with Dr. Chucky May.  She has ongoing chronic passive SI, but reports that she has made a contract with her therapist about steps she would take to reach out for crisis assistance.  She has not had any active suicidal thoughts over the past few weeks.  She continues on Seroquel, Pristiq, clonazepam and requests a 42-month refill to bridge her until her visit with Dr. Toy Care.    She did go to one DBT group session and decided it wasn't for her. She has no other acute concerns, wishes Probation officer well, I wish her the best of luck in her transition of care, she is welcome to contact clinic if any questions or concerns arise in the meantime.  Visit Diagnosis:    ICD-10-CM   1. Borderline personality disorder (HCC) F60.3 clonazePAM (KLONOPIN) 0.5 MG tablet    QUEtiapine (SEROQUEL) 200 MG tablet    desvenlafaxine (PRISTIQ) 100 MG 24 hr tablet  2. Chronic post-traumatic stress disorder (PTSD) F43.12 clonazePAM (KLONOPIN) 0.5 MG tablet    QUEtiapine (SEROQUEL) 200 MG tablet    desvenlafaxine (PRISTIQ) 100 MG 24 hr tablet  3. Bipolar 2 disorder (HCC) F31.81 clonazePAM (KLONOPIN) 0.5 MG tablet    QUEtiapine (SEROQUEL) 200 MG tablet    Past Psychiatric History: See intake H&P for full details. Reviewed, with no updates at this time.   Past Medical History:  Past Medical History:  Diagnosis Date  . ALLERGIC RHINITIS 01/20/2008  . ANEMIA-IRON DEFICIENCY 01/20/2008  . ANXIETY 01/20/2008  . ASTHMATIC BRONCHITIS, ACUTE 05/19/2008  . Bipolar disorder ([place])   . CENTRAL SLEEP APNEA CONDS CLASSIFIED ELSEWHERE 08/31/2010  . Complication of anesthesia 1987   "w/one of the anesthesia agents that they don't use anymore; chest muscles constricted; felt like I couldn't breath"  .  DEPRESSION 01/20/2008  . DYSPHAGIA UNSPECIFIED 12/06/2008  . ELEVATED BLOOD PRESSURE WITHOUT DIAGNOSIS OF HYPERTENSION 12/06/2008  . GERD   . History of sleep walking   . HYPERLIPIDEMIA 01/20/2008  . HYPERTENSION 12/16/2008  . Melanoma ([place]) 2012   "skin of my stomach"  . Migraine    "related to menses; went away after hysterectomy"  . OSA treated with BiPAP   . OTITIS MEDIA, ACUTE, BILATERAL 12/16/2008  . OTITIS MEDIA, SEROUS, CHRONIC 12/30/2008  . PVD 05/12/2010  . Pyelonephritis "hospitalized in ~ 1991"  . SINUSITIS- ACUTE-NOS 09/21/2008  . SWELLING MASS OR LUMP IN HEAD AND NECK 09/21/2008  . THYROID NODULE, LEFT    "unable to aspirate; has gotten very small"   . Trigeminal neuralgia 01/20/2008  . Unspecified hearing loss 09/21/2008  . VENOUS INSUFFICIENCY, LEGS 05/03/2010    Past Surgical History:  Procedure Laterality Date  . ABDOMINAL HYSTERECTOMY  2000  . APPENDECTOMY  2000  . BRAIN SURGERY    . BREAST BIOPSY Left 1994   Benign  . 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 CHOLECYSTECTOMY N/A 12/13/2015   Procedure: LAPAROSCOPIC CHOLECYSTECTOMY;  Surgeon: Mickeal Skinner, MD;  Location: [facility];  Service: General;  Laterality: N/A;  . INGUINAL HERNIA REPAIR Right 1987  . MELANOMA EXCISION  2015   "skin of my stomach"  . OOPHORECTOMY Bilateral 2000    Family Psychiatric History: See intake H&P  for full details. Reviewed, with no updates at this time.   Family History:  Family History  Problem Relation Age of Onset  . Hyperlipidemia Mother   . Hypertension Mother   . Diabetes Father   . Melanoma Father   . Anxiety disorder Father   . Depression Father   . Melanoma Sister   . Anxiety disorder Sister   . Depression Sister   . Heart disease Paternal Grandmother   . Anxiety disorder Brother   . Depression Brother   . Breast cancer Paternal Aunt   . Heart disease Other        Mother side of family-several uncles   . Anxiety disorder  Sister   . Depression Sister   . Colon cancer Neg Hx     Social History:  Social History   Socioeconomic History  . Marital status: Married    Spouse name: Not on file  . Number of children: 0  . Years of education: Not on file  . Highest education level: Not on file  Occupational History  . Occupation: disabled    Fish farm manager: UNEMPLOYED  Social Needs  . Financial resource strain: Not on file  . Food insecurity:    Worry: Not on file    Inability: Not on file  . Transportation needs:    Medical: Not on file    Non-medical: Not on file  Tobacco Use  . Smoking status: Never Smoker  . Smokeless tobacco: Never Used  Substance and Sexual Activity  . Alcohol use: No  . Drug use: No  . Sexual activity: Not Currently    Partners: Male    Birth control/protection: None  Lifestyle  . Physical activity:    Days per week: Not on file    Minutes per session: Not on file  . Stress: Not on file  Relationships  . Social connections:    Talks on phone: Not on file    Gets together: Not on file    Attends religious service: Not on file    Active member of club or organization: Not on file    Attends meetings of clubs or organizations: Not on file    Relationship status: Not on file  Other Topics Concern  . Not on file  Social History Narrative  . Not on file    Allergies:  Allergies  Allergen Reactions  . Aripiprazole Rash and Swelling  . Fentanyl     Other reaction(s): Other Sleep Walking  . Avapro [Irbesartan] Other (See Comments)    hyponatremia  . Sulfamethoxazole-Trimethoprim Nausea And Vomiting  . Bupropion Rash  . Ibuprofen Nausea And Vomiting    Metabolic Disorder Labs: Lab Results  Component Value Date   HGBA1C 5.6 09/16/2015   No results found for: PROLACTIN Lab Results  Component Value Date   CHOL 205 (H) 09/16/2015   TRIG 91.0 09/16/2015   HDL 86.00 09/16/2015   CHOLHDL 2 09/16/2015   VLDL 18.2 09/16/2015   LDLCALC 101 (H) 09/16/2015   LDLCALC  71 06/02/2012   Lab Results  Component Value Date   TSH 0.59 03/02/2016   TSH 0.86 06/14/2015    Therapeutic Level Labs: No results found for: LITHIUM No results found for: VALPROATE No components found for:  CBMZ  Current Medications: Current Outpatient Medications  Medication Sig Dispense Refill  . Buprenorphine (BUTRANS) 20 MCG/HR PTWK Place 20 mcg onto the skin every Tuesday.     Marland Kitchen BUTRANS 10 MCG/HR PTWK patch Take 10 mcg by mouth  daily as needed (for pain).   2  . clonazePAM (KLONOPIN) 0.5 MG tablet Take 1 tablet (0.5 mg total) by mouth 3 (three) times daily as needed for anxiety. 90 tablet 2  . desvenlafaxine (PRISTIQ) 100 MG 24 hr tablet Take 1 tablet (100 mg total) by mouth daily. 90 tablet 0  . EQUETRO 300 MG CP12 Take 600 mg by mouth 2 (two) times daily.   11  . metoprolol tartrate (LOPRESSOR) 50 MG tablet Take 1 tablet (50 mg total) by mouth 2 (two) times daily. 180 tablet 0  . NON FORMULARY at bedtime. BI-Pap    . NONFORMULARY OR COMPOUNDED ITEM Take 1 application by mouth 4 (four) times daily as needed. Topically on cheek and inside mouth of mouth to calm trigeminal nerve    . NUCYNTA 100 MG TABS Take 250 mg by mouth 3 (three) times daily.    . QUEtiapine (SEROQUEL) 200 MG tablet Take 1 tablet (200 mg total) by mouth at bedtime. 90 tablet 0   No current facility-administered medications for this visit.    Musculoskeletal: Strength & Muscle Tone: within normal limits Gait & Station: normal Patient leans: N/A  Psychiatric Specialty Exam: ROS   Blood pressure 132/76, pulse 88, height 5\' 7"  (1.702 m), weight 163 lb (73.9 kg).Body mass index is 25.53 kg/m.  General Appearance: Casual and Fairly Groomed  Eye Contact:  Fair  Speech:  Clear and Coherent and Normal Rate  Volume:  Normal  Mood:  Anxious and Depressed  Affect:  Non-Congruent  Thought Process:  Coherent, Goal Directed and Descriptions of Associations: Intact  Orientation:  Full (Time, Place, and Person)   Thought Content: Logical   Suicidal Thoughts:  No  Homicidal Thoughts:  No  Memory:  Immediate;   Fair  Judgement:  Fair  Insight:  Lacking  Psychomotor Activity:  Normal  Concentration:  Concentration: Fair  Recall:  [employer] of Knowledge: Fair  Language: Fair  Akathisia:  Negative  Handed:  Right  AIMS (if indicated): not done  Assets:  Neurosurgeon  ADL's:  Intact  Cognition: WNL  Sleep:  Fair   Screenings: AIMS     Office Visit from 07/16/2017 in [facility]  AIMS Total Score  0    PHQ2-9     Office Visit from 06/06/2016 in [facility] from 03/17/2014 in [facility]  PHQ-2 Total Score  6  0  PHQ-9 Total Score  20  -       Assessment and Plan: DALISA FORRER presents for a final visit and med check with this Probation officer.  We will provide 3 months of refill is she has a visit scheduled with Dr. Chucky May for psychiatric care in August.  She has continued in therapy, reconnecting with Mel Almond, and has a Surveyor, mining, and is working on coping strategies.  Things are fairly stable with her and her husband at home.  She reports that she sleeps well, and the Seroquel and Pristiq seemed to be effective for her mood stability, along with the clonazepam.    1. Borderline personality disorder ([place])   2. Chronic post-traumatic stress disorder (PTSD)   3. Bipolar 2 disorder (HCC)     Status of current problems: unchanged  Labs Ordered: No orders of the defined types were placed in this encounter.   Labs Reviewed: N/A  Collateral Obtained/Records Reviewed: N/A  Plan:  Refill of Seroquel, Pristiq,  clonazepam for 3 months, to bridge her and her transition of care to Dr. Toy Care No further follow-up with writer Continue in therapy, patient is aware of emergency resources if she has any safety concerns  I spent 20 minutes with the  patient in direct face-to-face clinical care.  Greater than 50% of this time was spent in counseling and coordination of care with the patient.    Aundra Dubin, MD 02/04/2018, 3:16 PM

## 2018-02-18 ENCOUNTER — Encounter: Payer: Self-pay | Admitting: Internal Medicine

## 2018-02-25 ENCOUNTER — Encounter: Payer: Self-pay | Admitting: Internal Medicine

## 2018-02-25 ENCOUNTER — Other Ambulatory Visit (INDEPENDENT_AMBULATORY_CARE_PROVIDER_SITE_OTHER): Payer: PRIVATE HEALTH INSURANCE

## 2018-02-25 ENCOUNTER — Ambulatory Visit: Payer: PRIVATE HEALTH INSURANCE | Admitting: Internal Medicine

## 2018-02-25 VITALS — BP 130/90 | HR 61 | Temp 98.4°F | Ht 67.0 in | Wt 160.0 lb

## 2018-02-25 DIAGNOSIS — F3132 Bipolar disorder, current episode depressed, moderate: Secondary | ICD-10-CM

## 2018-02-25 DIAGNOSIS — R1084 Generalized abdominal pain: Secondary | ICD-10-CM | POA: Insufficient documentation

## 2018-02-25 DIAGNOSIS — G8929 Other chronic pain: Secondary | ICD-10-CM

## 2018-02-25 DIAGNOSIS — R51 Headache: Secondary | ICD-10-CM | POA: Diagnosis not present

## 2018-02-25 LAB — LIPID PANEL
CHOL/HDL RATIO: 3
Cholesterol: 276 mg/dL — ABNORMAL HIGH (ref 0–200)
HDL: 83.9 mg/dL (ref >39.00)
LDL CALC: 176 mg/dL — AB (ref 0–99)
NONHDL: 192.22
TRIGLYCERIDES: 79 mg/dL (ref 0.0–149.0)
VLDL: 15.8 mg/dL (ref 0.0–40.0)

## 2018-02-25 LAB — CBC
HCT: 39.2 % (ref 36.0–46.0)
Hemoglobin: 13.1 g/dL (ref 12.0–15.0)
MCHC: 33.4 g/dL (ref 30.0–36.0)
MCV: 87.1 fl (ref 78.0–100.0)
Platelets: 279 10*3/uL (ref 150.0–400.0)
RBC: 4.51 Mil/uL (ref 3.87–5.11)
RDW: 13.1 % (ref 11.5–15.5)
WBC: 5.9 10*3/uL (ref 4.0–10.5)

## 2018-02-25 LAB — COMPREHENSIVE METABOLIC PANEL
ALT: 16 U/L (ref 0–35)
AST: 15 U/L (ref 0–37)
Albumin: 4.2 g/dL (ref 3.5–5.2)
Alkaline Phosphatase: 103 U/L (ref 39–117)
BUN: 9 mg/dL (ref 6–23)
CALCIUM: 8.8 mg/dL (ref 8.4–10.5)
CHLORIDE: 99 meq/L (ref 96–112)
CO2: 28 meq/L (ref 19–32)
CREATININE: 0.71 mg/dL (ref 0.40–1.20)
GFR: 89.21 mL/min (ref >60.00)
GLUCOSE: 103 mg/dL — AB (ref 70–99)
Potassium: 4.5 mEq/L (ref 3.5–5.1)
SODIUM: 134 meq/L — AB (ref 135–145)
Total Bilirubin: 0.3 mg/dL (ref 0.2–1.2)
Total Protein: 7.1 g/dL (ref 6.0–8.3)

## 2018-02-25 LAB — VITAMIN D 25 HYDROXY (VIT D DEFICIENCY, FRACTURES): VITD: 15.86 ng/mL — AB (ref 30.00–100.00)

## 2018-02-25 LAB — HEMOGLOBIN A1C: Hgb A1c MFr Bld: 5.5 % (ref 4.6–6.5)

## 2018-02-25 LAB — VITAMIN B12: VITAMIN B 12: 220 pg/mL (ref 211–911)

## 2018-02-25 LAB — T4, FREE: FREE T4: 0.66 ng/dL (ref 0.60–1.60)

## 2018-02-25 LAB — TSH: TSH: 1.63 u[IU]/mL (ref 0.35–4.50)

## 2018-02-25 MED ORDER — NALOXEGOL OXALATE 12.5 MG PO TABS: 12.5000 mg | ORAL_TABLET | Freq: Every day | ORAL | 6 refills | Status: DC

## 2018-02-25 NOTE — Assessment & Plan Note (Signed)
Suspect high stool burden and constipation. She is high risk for SBO and she does not have any evidence of this currently. Rx for movantik and savings card given today to help given her chronic opioid usage. Prior gallbladder, appendix, hysterectomy. No acute abdomen today.

## 2018-02-25 NOTE — Patient Instructions (Signed)
We are checking the labs today and will call you back about the results.  We have sent in movantik to take 1 pill daily for the constipation. We have given you a savings card.   For the clonazepam try decreasing by 1 pill daily per week. So cut back to 1 pill twice a day for 1 week. Then decrease to 1 pill daily for 1 week.  Once you get down to 1 pill daily (for the week) I would go down to 1/2 pill once a day for 1 week then stop altogether.

## 2018-02-25 NOTE — Assessment & Plan Note (Signed)
Suspect that her abdominal problems may be related to her pain management opioids. Rx for movantik for likely side effect. She is high risk for SBO given prior abdominal surgeries. Given information about decreasing clonazepam so she can qualify for cbd oil study.

## 2018-02-25 NOTE — Progress Notes (Signed)
   Subjective:    Patient ID: Sheryl Lester, female    DOB: 07/01/1956, 62 y.o.   MRN: [id number]  HPI The patient is a 62 YO female coming in for stomach problems. She is having constipation for a long time, alternating with some loose stools. She denies blood in stool. She does take opioid medicines. She has a heavy sensation of a brick in her stomach. She does get rare nausea. No vomiting. Appetite is poor. Weight is increasing. Some history of thyroid problems. She has prior gallbladder removal, appendix removal, hysterectomy and ovary removal (bilateral). She does also have hernia surgery. She does not have many GERD symptoms.  She is also having chronic facial pain. She is trying to get into a clinical study on CBD oil at the pain clinic she attends. She is hopeful that this would help. She is taking medications for the pain which they prescribe but still suffers with chronic pain. She is taking butrans patches and oxycarbamazepine and nucynta all the time.  She also has problems with anxiety and some long term psych problems. She is in the middle of switching providers. She is trying to get off clonazepam which she has been on long term. They have told her she cannot be on it and do the cbd trial and she would rather do that. She denies feeling any benefit from it. Taking it 3 times a day for many years.   Review of Systems  Constitutional: Positive for activity change, appetite change and unexpected weight change.  HENT: Negative.   Eyes: Negative.   Respiratory: Negative for cough, chest tightness and shortness of breath.   Cardiovascular: Negative for chest pain, palpitations and leg swelling.  Gastrointestinal: Positive for abdominal distention, abdominal pain, constipation and nausea. Negative for anal bleeding, blood in stool, diarrhea and vomiting.  Musculoskeletal: Positive for arthralgias and myalgias.  Skin: Negative.   Neurological: Negative.   Psychiatric/Behavioral: Positive  for decreased concentration and dysphoric mood. The patient is nervous/anxious.       Objective:   Physical Exam  Constitutional: She is oriented to person, place, and time. She appears well-developed and well-nourished.  HENT:  Head: Normocephalic and atraumatic.  Eyes: EOM are normal.  Neck: Normal range of motion.  Cardiovascular: Normal rate and regular rhythm.  Pulmonary/Chest: Effort normal and breath sounds normal. No respiratory distress. She has no wheezes. She has no rales.  Abdominal: Soft. Bowel sounds are normal. She exhibits no distension. There is tenderness. There is no rebound.  Generalized tenderness, no rebound or guarding. BS normal  Musculoskeletal: She exhibits no edema.  Neurological: She is alert and oriented to person, place, and time. Coordination normal.  Skin: Skin is warm and dry.  Psychiatric: She has a normal mood and affect.   Vitals:   02/25/18 1412  BP: 130/90  Pulse: 61  Temp: 98.4 F (36.9 C)  TempSrc: Oral  SpO2: 96%  Weight: 160 lb (72.6 kg)  Height: 5\' 7"  (1.702 m)      Assessment & Plan:

## 2018-02-25 NOTE — Assessment & Plan Note (Signed)
She is asking today how to wean off clonazepam. Have given her advice on decreasing 1 pill per day every 1-2 weeks. If she has problems increase back to dose she was doing well on. We talked about symptoms of withdrawal as well as other things that may be normal changes from decreasing dose.

## 2018-03-13 ENCOUNTER — Telehealth: Payer: Self-pay | Admitting: Internal Medicine

## 2018-03-13 NOTE — Telephone Encounter (Signed)
PA started on CoverMyMeds KEY: [id number]

## 2018-03-13 NOTE — Telephone Encounter (Signed)
Copied from [place] [phone]. Topic: Quick Communication - See Telephone Encounter >> Mar 13, 2018  3:22 PM Gardiner Ramus wrote: CRM for notification. See Telephone encounter for: 03/13/18. naloxegol oxalate pt is saying insurance is asking for prior authorization. Please advise  Pt had an appointment 02/25/18. She stated the pharmacy needed to call but I do not see any notes.

## 2018-03-20 ENCOUNTER — Ambulatory Visit: Payer: Self-pay | Admitting: Internal Medicine

## 2018-03-21 ENCOUNTER — Ambulatory Visit: Payer: PRIVATE HEALTH INSURANCE | Admitting: Internal Medicine

## 2018-03-21 ENCOUNTER — Encounter: Payer: Self-pay | Admitting: Internal Medicine

## 2018-03-21 VITALS — BP 110/70 | HR 59 | Temp 98.0°F | Ht 67.0 in | Wt 157.0 lb

## 2018-03-21 DIAGNOSIS — J029 Acute pharyngitis, unspecified: Secondary | ICD-10-CM

## 2018-03-21 LAB — POCT RAPID STREP A (OFFICE): Rapid Strep A Screen: NEGATIVE

## 2018-03-21 NOTE — Progress Notes (Signed)
   Subjective:    Patient ID: Sheryl Lester, female    DOB: 09/13/1955, 62 y.o.   MRN: [id number]  HPI The patient is a 62 YO female coming in for sore throat, myalgias, nasal drainage, ear pain. Started about 2-3 days ago. Some pain with swallowing. Not much appetite, some nausea. Denies cough or SOB. No chest pains. Has not taken anything for it. Denies fevers or chills. Her sister is sick with similar symptoms as well although they have not had contact. Overall worsening since onset.   Review of Systems  Constitutional: Positive for activity change and appetite change. Negative for chills, fatigue, fever and unexpected weight change.  HENT: Positive for congestion, postnasal drip, rhinorrhea, sinus pressure, sore throat and trouble swallowing. Negative for ear discharge, ear pain, sinus pain, sneezing, tinnitus and voice change.   Eyes: Negative.   Respiratory: Negative for cough, chest tightness, shortness of breath and wheezing.   Cardiovascular: Negative.   Gastrointestinal: Positive for nausea.  Musculoskeletal: Negative.   Neurological: Negative.       Objective:   Physical Exam  Constitutional: She is oriented to person, place, and time. She appears well-developed and well-nourished.  HENT:  Head: Normocephalic and atraumatic.  Oropharynx with redness and clear drainage, nose with swollen turbinates, TMs normal bilaterally  Eyes: EOM are normal.  Neck: Normal range of motion. No thyromegaly present.  Cardiovascular: Normal rate and regular rhythm.  Pulmonary/Chest: Effort normal and breath sounds normal. No respiratory distress. She has no wheezes. She has no rales.  Abdominal: Soft.  Musculoskeletal: She exhibits tenderness.  Lymphadenopathy:    She has no cervical adenopathy.  Neurological: She is alert and oriented to person, place, and time.  Skin: Skin is warm and dry.   Vitals:   03/21/18 0955  BP: 110/70  Pulse: (!) 59  Temp: 98 F (36.7 C)  TempSrc: Oral    SpO2: 97%  Weight: 157 lb (71.2 kg)  Height: 5\' 7"  (1.702 m)   POC strep: negative    Assessment & Plan:

## 2018-03-21 NOTE — Telephone Encounter (Signed)
PA Approved through 03/14/2018-03/14/2019

## 2018-03-21 NOTE — Patient Instructions (Addendum)
Your strep test is negative which means that this is a virus.   Make sure to drink plenty of fluids. We recommend to start taking zyrtec daily to help with drainage. Call us if symptoms are not improving by around Wednesday of next week.   Upper Respiratory Infection, Adult Most upper respiratory infections (URIs) are caused by a virus. A URI affects the nose, throat, and upper air passages. The most common type of URI is often called "the common cold." Follow these instructions at home:  Take medicines only as told by your doctor.  Gargle warm saltwater or take cough drops to comfort your throat as told by your doctor.  Use a warm mist humidifier or inhale steam from a shower to increase air moisture. This may make it easier to breathe.  Drink enough fluid to keep your pee (urine) clear or pale yellow.  Eat soups and other clear broths.  Have a healthy diet.  Rest as needed.  Go back to work when your fever is gone or your doctor says it is okay. ? You may need to stay home longer to avoid giving your URI to others. ? You can also wear a face mask and wash your hands often to prevent spread of the virus.  Use your inhaler more if you have asthma.  Do not use any tobacco products, including cigarettes, chewing tobacco, or electronic cigarettes. If you need help quitting, ask your doctor. Contact a doctor if:  You are getting worse, not better.  Your symptoms are not helped by medicine.  You have chills.  You are getting more short of breath.  You have brown or red mucus.  You have yellow or brown discharge from your nose.  You have pain in your face, especially when you bend forward.  You have a fever.  You have puffy (swollen) neck glands.  You have pain while swallowing.  You have white areas in the back of your throat. Get help right away if:  You have very bad or constant: ? Headache. ? Ear pain. ? Pain in your forehead, behind your eyes, and over your  cheekbones (sinus pain). ? Chest pain.  You have long-lasting (chronic) lung disease and any of the following: ? Wheezing. ? Long-lasting cough. ? Coughing up blood. ? A change in your usual mucus.  You have a stiff neck.  You have changes in your: ? Vision. ? Hearing. ? Thinking. ? Mood. This information is not intended to replace advice given to you by your health care provider. Make sure you discuss any questions you have with your health care provider. Document Released: 01/30/2008 Document Revised: 04/15/2016 Document Reviewed: 11/18/2013 Elsevier Interactive Patient Education  2018 [employer].

## 2018-03-21 NOTE — Assessment & Plan Note (Signed)
Offered lidocaine viscous which she declines as well as flonase. POC strep test done in the office negative. Likely viral illness. Advised supportive care measures. She will call back at 1 week if symptoms not improving.

## 2018-04-14 ENCOUNTER — Other Ambulatory Visit: Payer: Self-pay

## 2018-04-14 MED ORDER — METOPROLOL TARTRATE 50 MG PO TABS: 50.0000 mg | ORAL_TABLET | Freq: Two times a day (BID) | ORAL | 2 refills | Status: DC

## 2018-05-19 ENCOUNTER — Encounter: Payer: Self-pay | Admitting: Physician Assistant

## 2018-05-19 ENCOUNTER — Encounter

## 2018-05-19 ENCOUNTER — Ambulatory Visit (INDEPENDENT_AMBULATORY_CARE_PROVIDER_SITE_OTHER): Payer: PRIVATE HEALTH INSURANCE | Admitting: Physician Assistant

## 2018-05-19 ENCOUNTER — Other Ambulatory Visit (INDEPENDENT_AMBULATORY_CARE_PROVIDER_SITE_OTHER): Payer: PRIVATE HEALTH INSURANCE

## 2018-05-19 VITALS — BP 168/100 | HR 85 | Ht 67.0 in | Wt 164.0 lb

## 2018-05-19 DIAGNOSIS — R1031 Right lower quadrant pain: Secondary | ICD-10-CM

## 2018-05-19 DIAGNOSIS — G8929 Other chronic pain: Secondary | ICD-10-CM | POA: Diagnosis not present

## 2018-05-19 DIAGNOSIS — K219 Gastro-esophageal reflux disease without esophagitis: Secondary | ICD-10-CM

## 2018-05-19 LAB — CBC WITH DIFFERENTIAL/PLATELET
BASOS ABS: 0.1 10*3/uL (ref 0.0–0.1)
Basophils Relative: 1.1 % (ref 0.0–3.0)
EOS PCT: 1.3 % (ref 0.0–5.0)
Eosinophils Absolute: 0.1 10*3/uL (ref 0.0–0.7)
HCT: 36.9 % (ref 36.0–46.0)
HEMOGLOBIN: 12.5 g/dL (ref 12.0–15.0)
LYMPHS ABS: 1.1 10*3/uL (ref 0.7–4.0)
Lymphocytes Relative: 18.6 % (ref 12.0–46.0)
MCHC: 33.9 g/dL (ref 30.0–36.0)
MCV: 84.7 fl (ref 78.0–100.0)
MONO ABS: 0.3 10*3/uL (ref 0.1–1.0)
Monocytes Relative: 5.5 % (ref 3.0–12.0)
NEUTROS PCT: 73.5 % (ref 43.0–77.0)
Neutro Abs: 4.5 10*3/uL (ref 1.4–7.7)
Platelets: 341 10*3/uL (ref 150.0–400.0)
RBC: 4.35 Mil/uL (ref 3.87–5.11)
RDW: 13.8 % (ref 11.5–15.5)
WBC: 6.2 10*3/uL (ref 4.0–10.5)

## 2018-05-19 LAB — COMPREHENSIVE METABOLIC PANEL WITH GFR
ALT: 12 U/L (ref 0–35)
AST: 16 U/L (ref 0–37)
Albumin: 4.4 g/dL (ref 3.5–5.2)
Alkaline Phosphatase: 98 U/L (ref 39–117)
BUN: 11 mg/dL (ref 6–23)
CO2: 25 meq/L (ref 19–32)
Calcium: 9 mg/dL (ref 8.4–10.5)
Chloride: 94 meq/L — ABNORMAL LOW (ref 96–112)
Creatinine, Ser: 0.65 mg/dL (ref 0.40–1.20)
GFR: 98.71 mL/min
Glucose, Bld: 111 mg/dL — ABNORMAL HIGH (ref 70–99)
Potassium: 4.9 meq/L (ref 3.5–5.1)
Sodium: 128 meq/L — ABNORMAL LOW (ref 135–145)
Total Bilirubin: 0.3 mg/dL (ref 0.2–1.2)
Total Protein: 7.7 g/dL (ref 6.0–8.3)

## 2018-05-19 LAB — SEDIMENTATION RATE: SED RATE: 34 mm/h — AB (ref 0–30)

## 2018-05-19 NOTE — Progress Notes (Signed)
Subjective:    Patient ID: Sheryl Lester, female    DOB: 10/19/1955, 62 y.o.   MRN: [id number]  HPI Sheryl Lester is a 62 year old white female, known to Dr. Henrene Lester who was last seen in our office in 2014.  She comes in today with complaints of 4 to 5-week history of right lower abdominal pain. He is status post cholecystectomy, appendectomy hysterectomy and BSO.  Also with history of peripheral vascular disease, hypertension, migraines, bipolar disorder and borderline. She had EGD in January 2014 with finding of GERD with mild esophagitis and colonoscopy in May 2011 was normal and indicated for 10-year interval follow-up. Says she has not been on anything for reflux in the past few years.  She primarily been dealing with trigeminal neuralgia which she says is chronic and constant.  She is opioid dependent currently on a buprenorphine patch and Nucynta.  Has had some concerns about reflux because she is been having a "zinging" sensation/pain along her teeth recently and wondered if reflux may be the issue.  She has no current complaints of heartburn or indigestion. She also has been having a burning type pain in the right lower quadrant which sometimes will radiate all the way up into her chest.  She is feels better after a bowel movement or passing flatus.  She says also a full bladder seems to irritate this area and make the pain worse.  Bowel movements have been somewhat irregular no melena or hematochezia.  Review of Systems Pertinent positive and negative review of systems were noted in the above HPI section.  All other review of systems was otherwise negative.  Outpatient Encounter Medications as of 05/19/2018  Medication Sig  . Buprenorphine (BUTRANS) 20 MCG/HR PTWK Place 20 mcg onto the skin every Tuesday.   Marland Kitchen BUTRANS 10 MCG/HR PTWK patch Take 10 mcg by mouth daily as needed (for pain).   . clonazePAM (KLONOPIN) 0.5 MG tablet Take 1 tablet (0.5 mg total) by mouth 3 (three) times daily as  needed for anxiety.  . EQUETRO 300 MG CP12 Take 600 mg by mouth 2 (two) times daily.   . metoprolol tartrate (LOPRESSOR) 50 MG tablet Take 1 tablet (50 mg total) by mouth 2 (two) times daily.  . mirtazapine (REMERON) 15 MG tablet Take 15 mg by mouth at bedtime.  . NON FORMULARY at bedtime. BI-Pap  . NONFORMULARY OR COMPOUNDED ITEM Take 1 application by mouth 4 (four) times daily as needed. Topically on cheek and inside mouth of mouth to calm trigeminal nerve  . NUCYNTA 100 MG TABS Take 250 mg by mouth 3 (three) times daily.  . QUEtiapine (SEROQUEL) 200 MG tablet Take 1 tablet (200 mg total) by mouth at bedtime.  . Vilazodone HCl (VIIBRYD PO) Take by mouth.  . [DISCONTINUED] desvenlafaxine (PRISTIQ) 100 MG 24 hr tablet Take 1 tablet (100 mg total) by mouth daily.  . [DISCONTINUED] naloxegol oxalate (MOVANTIK) 12.5 MG TABS tablet Take 1 tablet (12.5 mg total) by mouth daily.   No facility-administered encounter medications on file as of 05/19/2018.    Allergies  Allergen Reactions  . Aripiprazole Rash and Swelling  . Fentanyl     Other reaction(s): Other Sleep Walking  . Avapro [Irbesartan] Other (See Comments)    hyponatremia  . Sulfamethoxazole-Trimethoprim Nausea And Vomiting  . Bupropion Rash  . Ibuprofen Nausea And Vomiting   Patient Active Problem List   Diagnosis Date Noted  . Sore throat 03/21/2018  . Generalized abdominal pain 02/25/2018  . Sleep  apnea 11/20/2016  . Bipolar disorder, in partial remission, most recent episode depressed ([place]) 11/20/2016  . Bipolar 1 disorder, depressed, moderate ([place]) 05/27/2016  . Rash and nonspecific skin eruption 03/21/2016  . Encounter for therapeutic drug monitoring 03/02/2016  . Cholecystitis with cholelithiasis 12/12/2015  . 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 Chronic pain in face 03/25/2015  . 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 Migraine with aura, intractable 04/03/2013  . Dysesthesia 02/19/2013  . Osteopenia 12/23/2012  .  Intractable migraine 11/14/2012  . Skin lesion 09/23/2012  . Hearing loss 09/23/2012  . 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 Atypical facial pain 05/30/2012  . Facial pain 05/29/2012  . Spells 11/23/2011  . Chronic interstitial cystitis 11/14/2011  . Parasomnia 10/01/2011  . Melanoma ([place]) 07/05/2011  . Preventative health care 03/01/2011  . PVD 05/12/2010  . Essential hypertension 12/16/2008  . TIA 12/16/2008  . GERD 12/06/2008  . Unspecified hearing loss 09/21/2008  . HYPERLIPIDEMIA 01/20/2008  . ANEMIA-IRON DEFICIENCY 01/20/2008  . Trigeminal neuralgia 01/20/2008  . ALLERGIC RHINITIS 01/20/2008   Social History   Socioeconomic History  . Marital status: Married    Spouse name: Not on file  . Number of children: 0  . Years of education: Not on file  . Highest education level: Not on file  Occupational History  . Occupation: disabled    Fish farm manager: UNEMPLOYED  Social Needs  . Financial resource strain: Not on file  . Food insecurity:    Worry: Not on file    Inability: Not on file  . Transportation needs:    Medical: Not on file    Non-medical: Not on file  Tobacco Use  . Smoking status: Never Smoker  . Smokeless tobacco: Never Used  Substance and Sexual Activity  . Alcohol use: No  . Drug use: No  . Sexual activity: Not Currently    Partners: Male    Birth control/protection: None  Lifestyle  . Physical activity:    Days per week: Not on file    Minutes per session: Not on file  . Stress: Not on file  Relationships  . Social connections:    Talks on phone: Not on file    Gets together: Not on file    Attends religious service: Not on file    Active member of club or organization: Not on file    Attends meetings of clubs or organizations: Not on file    Relationship status: Not on file  . Intimate partner violence:    Fear of current or ex partner: Not on file    Emotionally abused: Not on file    Physically abused: Not on file    Forced sexual  activity: Not on file  Other Topics Concern  . Not on file  Social History Narrative  . Not on file    Ms. Sheryl Lester's family history includes Anxiety disorder in her brother, father, sister, and sister; Breast cancer in her paternal aunt; Cancer - Cervical in her mother; Depression in her brother, father, sister, and sister; Diabetes in her father; Heart disease in her other and paternal grandmother; Hyperlipidemia in her mother; Hypertension in her mother; Melanoma in her father and sister.      Objective:    Vitals:   05/19/18 1330  BP: (!) 168/100  Pulse: 85    Physical Exam; well-developed older white female in no acute distress, blood pressure 168/100, pulse 85, height 5 foot 7, weight 164, BMI 25.6.  HEENT; nontraumatic normocephalic EOMI PERRLA sclera  anicteric oral mucosa moist, Cardiovascular; regular rate and rhythm with S1-S2 no murmur rub or gallop, Pulmonary; clear bilaterally, Abdomen soft, bowel sounds are present, she is tender in the right lower quadrant, there is no guarding or rebound no palpable mass or hepatosplenomegaly, Rectal ;exam not done, Extremities; no clubbing cyanosis or edema skin warm and dry, Neuro psych ;alert and oriented, grossly nonfocal, mood and affect appropriate       Assessment & Plan:   [age] year old white female with 4 to 5-week history of right lower quadrant pain described as burning, constipated and somewhat improved after bowel movements, worse with full bladder.  Etiology not clear Rule out occult colon lesion, rule out right lower abdominal inflammatory process She is status post hysterectomy and BSO as well as appendectomy  #2 status post cholecystectomy #3.  GERD-normally symptomatic at present, on no meds #4trigeminal neuralgia with chronic pain # 5 opioid dependence #6 peripheral vascular disease #7 bipolar disorder/borderline personality  Plan; CBC with differential, CMET Tegretol level at patient request Scheduled for CT of  the abdomen and pelvis with contrast  If CT is unrevealing will need colonoscopy with Dr. Henrene Lester.      Amy S Esterwood PA-C 05/19/2018   Cc: Hoyt Koch, *

## 2018-05-19 NOTE — Patient Instructions (Signed)
Your provider has requested that you go to the basement level for lab work before leaving today. Press "B" on the elevator. The lab is located at the first door on the left as you exit the elevator.   You have been scheduled for a CT scan of the abdomen and pelvis at [facility] ([address]---this is in the same building as [facility]).   You are scheduled on Wed 05-28-2018 at 3;30 PM. You should arrive at 3:15 PM to your appointment time for registration. Please follow the written instructions below on the day of your exam:  WARNING: IF YOU ARE ALLERGIC TO IODINE/X-RAY DYE, PLEASE NOTIFY RADIOLOGY IMMEDIATELY AT [phone]! YOU WILL BE GIVEN A 13 HOUR PREMEDICATION PREP.  1) Do not eat  anything after 11:30 am  (4 hours prior to your test) 2) You have been given 2 bottles of oral contrast to drink. The solution may taste better if refrigerated, but do NOT add ice or any other liquid to this solution. Shake well before drinking.    Drink 1 bottle of contrast @ 1:30 PM (2 hours prior to your exam)  Drink 1 bottle of contrast @ 2:30 PM (1 hour prior to your exam)  You may take any medications as prescribed with a small amount of water except for the following: Metformin, Glucophage, Glucovance, Avandamet, Riomet, Fortamet, Actoplus Met, Janumet, Glumetza or Metaglip. The above medications must be held the day of the exam AND 48 hours after the exam.  The purpose of you drinking the oral contrast is to aid in the visualization of your intestinal tract. The contrast solution may cause some diarrhea. Before your exam is started, you will be given a small amount of fluid to drink. Depending on your individual set of symptoms, you may also receive an intravenous injection of x-ray contrast/dye. Plan on being at [facility] for 30 minutes or long, depending on the type of exam you are having performed.  If you have any questions regarding your exam or if you need to  reschedule, you may call the CT department at [phone] between the hours of 8:00 am and 5:00 pm, Monday-Friday.  ________________________________________________________________________

## 2018-05-20 ENCOUNTER — Telehealth: Payer: Self-pay | Admitting: Physician Assistant

## 2018-05-20 LAB — CARBAMAZEPINE LEVEL, TOTAL: Carbamazepine Lvl: 9.5 mg/L (ref 4.0–12.0)

## 2018-05-20 NOTE — Progress Notes (Signed)
Assessment and plans reviewed  

## 2018-05-21 ENCOUNTER — Ambulatory Visit (INDEPENDENT_AMBULATORY_CARE_PROVIDER_SITE_OTHER)
Admission: RE | Admit: 2018-05-21 | Discharge: 2018-05-21 | Disposition: A | Discharge status: Home / Self Care | Payer: PRIVATE HEALTH INSURANCE | Source: Ambulatory Visit | Attending: Physician Assistant | Admitting: Physician Assistant

## 2018-05-21 DIAGNOSIS — R1031 Right lower quadrant pain: Secondary | ICD-10-CM | POA: Diagnosis not present

## 2018-05-21 DIAGNOSIS — G8929 Other chronic pain: Secondary | ICD-10-CM

## 2018-05-21 MED ORDER — IOPAMIDOL (ISOVUE-300) INJECTION 61%
100.0000 mL | Freq: Once | INTRAVENOUS | Status: AC | PRN
  Administered 2018-05-21: 100 mL via INTRAVENOUS

## 2018-05-21 NOTE — Telephone Encounter (Signed)
Would like to get these results before her CT Scan appointment today at 3:30pm. Says they told her labs are already in Epic.

## 2018-05-21 NOTE — Telephone Encounter (Signed)
Gave the patient the results and advised her I do not have any recommendations because the results have not been reviewed by her provider.

## 2018-05-25 ENCOUNTER — Encounter (HOSPITAL_COMMUNITY): Payer: Self-pay

## 2018-05-25 ENCOUNTER — Other Ambulatory Visit: Payer: Self-pay

## 2018-05-25 ENCOUNTER — Emergency Department (HOSPITAL_COMMUNITY)
Admission: EM | Admit: 2018-05-25 | Discharge: 2018-05-25 | Disposition: A | Discharge status: Home / Self Care | Payer: PRIVATE HEALTH INSURANCE | Attending: Emergency Medicine | Admitting: Emergency Medicine

## 2018-05-25 ENCOUNTER — Emergency Department (HOSPITAL_COMMUNITY): Payer: PRIVATE HEALTH INSURANCE

## 2018-05-25 ENCOUNTER — Ambulatory Visit (HOSPITAL_COMMUNITY)
Admission: EM | Admit: 2018-05-25 | Discharge: 2018-05-25 | Disposition: A | Discharge status: Home / Self Care | Payer: PRIVATE HEALTH INSURANCE | Source: Home / Self Care

## 2018-05-25 DIAGNOSIS — Z5321 Procedure and treatment not carried out due to patient leaving prior to being seen by health care provider: Secondary | ICD-10-CM | POA: Insufficient documentation

## 2018-05-25 DIAGNOSIS — R509 Fever, unspecified: Secondary | ICD-10-CM | POA: Diagnosis not present

## 2018-05-25 LAB — COMPREHENSIVE METABOLIC PANEL
ALT: 21 U/L (ref 0–44)
ANION GAP: 8 (ref 5–15)
AST: 25 U/L (ref 15–41)
Albumin: 3.8 g/dL (ref 3.5–5.0)
Alkaline Phosphatase: 102 U/L (ref 38–126)
BUN: 6 mg/dL (ref 6–20)
CHLORIDE: 96 mmol/L — AB (ref 98–111)
CO2: 28 mmol/L (ref 22–32)
Calcium: 8.8 mg/dL — ABNORMAL LOW (ref 8.9–10.3)
Creatinine, Ser: 0.76 mg/dL (ref 0.44–1.00)
Glucose, Bld: 88 mg/dL (ref 70–99)
POTASSIUM: 4.3 mmol/L (ref 3.5–5.1)
Sodium: 132 mmol/L — ABNORMAL LOW (ref 135–145)
TOTAL PROTEIN: 6.9 g/dL (ref 6.5–8.1)
Total Bilirubin: 0.2 mg/dL — ABNORMAL LOW (ref 0.3–1.2)

## 2018-05-25 LAB — CBC WITH DIFFERENTIAL/PLATELET
ABS IMMATURE GRANULOCYTES: 0 10*3/uL (ref 0.0–0.1)
BASOS ABS: 0 10*3/uL (ref 0.0–0.1)
BASOS PCT: 1 %
Eosinophils Absolute: 0.3 10*3/uL (ref 0.0–0.7)
Eosinophils Relative: 4 %
HCT: 34.9 % — ABNORMAL LOW (ref 36.0–46.0)
HEMOGLOBIN: 11.3 g/dL — AB (ref 12.0–15.0)
IMMATURE GRANULOCYTES: 0 %
LYMPHS PCT: 17 %
Lymphs Abs: 1.1 10*3/uL (ref 0.7–4.0)
MCH: 29 pg (ref 26.0–34.0)
MCHC: 32.4 g/dL (ref 30.0–36.0)
MCV: 89.7 fL (ref 78.0–100.0)
Monocytes Absolute: 0.6 10*3/uL (ref 0.1–1.0)
Monocytes Relative: 9 %
NEUTROS ABS: 4.3 10*3/uL (ref 1.7–7.7)
NEUTROS PCT: 69 %
PLATELETS: 238 10*3/uL (ref 150–400)
RBC: 3.89 MIL/uL (ref 3.87–5.11)
RDW: 12.4 % (ref 11.5–15.5)
WBC: 6.3 10*3/uL (ref 4.0–10.5)

## 2018-05-25 LAB — I-STAT BETA HCG BLOOD, ED (MC, WL, AP ONLY): I-stat hCG, quantitative: <5 m[IU]/mL (ref <5)

## 2018-05-25 LAB — I-STAT CG4 LACTIC ACID, ED: Lactic Acid, Venous: 1.15 mmol/L (ref 0.5–1.9)

## 2018-05-25 LAB — PROTIME-INR
INR: 0.97
Prothrombin Time: 12.8 seconds (ref 11.4–15.2)

## 2018-05-25 MED ORDER — ACETAMINOPHEN 325 MG PO TABS
650.0000 mg | ORAL_TABLET | Freq: Once | ORAL | Status: AC | PRN
  Administered 2018-05-25: 650 mg via ORAL
  Filled 2018-05-25: qty 2

## 2018-05-25 NOTE — ED Notes (Signed)
At desk yelling about the long wait.  Explained our wait time and offered to give patient something for nausea.  Refuses states I'll call for my results tomorrow.  Left at this time.

## 2018-05-25 NOTE — ED Triage Notes (Signed)
Pt here from home with a fever and chills.  Pt states husband has had a fever and cough for last week and pt has not been able to get over the cough. Endorses headache.

## 2018-05-25 NOTE — ED Triage Notes (Signed)
Pt in distress at the desk, spoke with patient about her symptoms, pt crying, in pain, doubled over. Per Jaynee Eagles, pt needs eval in the ER. Pt given information for different wait times at different ERs. Pt left with husband.

## 2018-05-25 NOTE — ED Notes (Signed)
Pt attempted to use restroom, unable to go at this time

## 2018-05-26 ENCOUNTER — Telehealth: Payer: Self-pay | Admitting: Internal Medicine

## 2018-05-26 NOTE — Telephone Encounter (Signed)
Patient is scheduled with [facility] tomorrow morning.  Would like to know if she would be able to let her know what the lab results were at the ED.

## 2018-05-26 NOTE — Telephone Encounter (Signed)
Copied from [place] [id number]. Topic: General - Other >> May 26, 2018  2:59 PM Sheryl Lester A wrote: Reason for CRM: Patient called in wanting to go over her lab results. Says it's urgent that she knows today. She would like someone to call her back today in regards to this.

## 2018-05-27 ENCOUNTER — Encounter: Payer: Self-pay | Admitting: Family

## 2018-05-27 ENCOUNTER — Ambulatory Visit: Payer: PRIVATE HEALTH INSURANCE | Admitting: Family

## 2018-05-27 VITALS — BP 142/82 | HR 93 | Temp 98.3°F | Ht 67.0 in | Wt 159.1 lb

## 2018-05-27 DIAGNOSIS — J209 Acute bronchitis, unspecified: Secondary | ICD-10-CM

## 2018-05-27 MED ORDER — BENZONATATE 100 MG PO CAPS: 100.0000 mg | ORAL_CAPSULE | Freq: Three times a day (TID) | ORAL | 0 refills | Status: DC | PRN

## 2018-05-27 MED ORDER — DOXYCYCLINE HYCLATE 100 MG PO TABS: 100.0000 mg | ORAL_TABLET | Freq: Two times a day (BID) | ORAL | 0 refills | Status: DC

## 2018-05-27 NOTE — Progress Notes (Signed)
Sheryl Lester is a 62 y.o. female with the following history as recorded in EpicCare:  Patient Active Problem List   Diagnosis Date Noted  . Sore throat 03/21/2018  . Generalized abdominal pain 02/25/2018  . Sleep apnea 11/20/2016  . Bipolar disorder, in partial remission, most recent episode depressed ([place]) 11/20/2016  . Bipolar 1 disorder, depressed, moderate ([place]) 05/27/2016  . Rash and nonspecific skin eruption 03/21/2016  . Encounter for therapeutic drug monitoring 03/02/2016  . Cholecystitis with cholelithiasis 12/12/2015  . 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 Chronic pain in face 03/25/2015  . 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 Migraine with aura, intractable 04/03/2013  . Dysesthesia 02/19/2013  . Osteopenia 12/23/2012  . Intractable migraine 11/14/2012  . Skin lesion 09/23/2012  . Hearing loss 09/23/2012  . 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 Atypical facial pain 05/30/2012  . Facial pain 05/29/2012  . Spells 11/23/2011  . Chronic interstitial cystitis 11/14/2011  . Parasomnia 10/01/2011  . Melanoma ([place]) 07/05/2011  . Preventative health care 03/01/2011  . PVD 05/12/2010  . Essential hypertension 12/16/2008  . TIA 12/16/2008  . GERD 12/06/2008  . Unspecified hearing loss 09/21/2008  . HYPERLIPIDEMIA 01/20/2008  . ANEMIA-IRON DEFICIENCY 01/20/2008  . Trigeminal neuralgia 01/20/2008  . ALLERGIC RHINITIS 01/20/2008    Current Outpatient Medications  Medication Sig Dispense Refill  . Buprenorphine (BUTRANS) 20 MCG/HR PTWK Place 20 mcg onto the skin every Tuesday.     Marland Kitchen BUTRANS 10 MCG/HR PTWK patch Take 10 mcg by mouth daily as needed (for pain).   2  . clonazePAM (KLONOPIN) 0.5 MG tablet Take 1 tablet (0.5 mg total) by mouth 3 (three) times daily as needed for anxiety. 90 tablet 2  . EQUETRO 300 MG CP12 Take 600 mg by mouth 2 (two) times daily.   11  . metoprolol tartrate (LOPRESSOR) 50 MG tablet Take 1 tablet (50 mg total) by mouth 2 (two) times  daily. 180 tablet 2  . mirtazapine (REMERON) 15 MG tablet Take 15 mg by mouth at bedtime.    . NON FORMULARY at bedtime. BI-Pap    . NONFORMULARY OR COMPOUNDED ITEM Take 1 application by mouth 4 (four) times daily as needed. Topically on cheek and inside mouth of mouth to calm trigeminal nerve    . NUCYNTA 100 MG TABS Take 250 mg by mouth 3 (three) times daily.    . QUEtiapine (SEROQUEL) 200 MG tablet Take 1 tablet (200 mg total) by mouth at bedtime. 90 tablet 0  . Vilazodone HCl (VIIBRYD PO) Take by mouth.    . benzonatate (TESSALON) 100 MG capsule Take 1 capsule (100 mg total) by mouth 3 (three) times daily as needed. 20 capsule 0  . diazepam (VALIUM) 5 MG tablet Take 5 mg by mouth 3 (three) times daily.  0  . doxycycline (VIBRA-TABS) 100 MG tablet Take 1 tablet (100 mg total) by mouth 2 (two) times daily. 20 tablet 0  . mirtazapine (REMERON) 30 MG tablet TAKE 1/2 TABLET AT BEDTIME FOR 3 NIGHTS THEN 1 AT BEDTIME  5  . tiZANidine (ZANAFLEX) 4 MG tablet TK 1 T PO QHS FOR 2 WKS THEN CAN TK 1 T TID PRN  0   No current facility-administered medications for this visit.     Allergies: Aripiprazole; Fentanyl; Avapro [irbesartan]; Sulfamethoxazole-trimethoprim; Bupropion; and Ibuprofen  Past Medical History:  Diagnosis Date  . ALLERGIC RHINITIS 01/20/2008  . ANEMIA-IRON DEFICIENCY 01/20/2008  . ANXIETY 01/20/2008  . ASTHMATIC BRONCHITIS, ACUTE 05/19/2008  . Bipolar  disorder ([place])   . CENTRAL SLEEP APNEA CONDS CLASSIFIED ELSEWHERE 08/31/2010  . Complication of anesthesia 1987   "w/one of the anesthesia agents that they don't use anymore; chest muscles constricted; felt like I couldn't breath"  . DEPRESSION 01/20/2008  . DYSPHAGIA UNSPECIFIED 12/06/2008  . ELEVATED BLOOD PRESSURE WITHOUT DIAGNOSIS OF HYPERTENSION 12/06/2008  . GERD   . History of sleep walking   . HYPERLIPIDEMIA 01/20/2008  . HYPERTENSION 12/16/2008  . Melanoma ([place]) 2012   "skin of my stomach"  . Migraine    "related to menses; went  away after hysterectomy"  . OSA treated with BiPAP   . OTITIS MEDIA, ACUTE, BILATERAL 12/16/2008  . OTITIS MEDIA, SEROUS, CHRONIC 12/30/2008  . PVD 05/12/2010  . Pyelonephritis "hospitalized in ~ 1991"  . SINUSITIS- ACUTE-NOS 09/21/2008  . SWELLING MASS OR LUMP IN HEAD AND NECK 09/21/2008  . THYROID NODULE, LEFT    "unable to aspirate; has gotten very small"   . Trigeminal neuralgia 01/20/2008  . Unspecified hearing loss 09/21/2008  . VENOUS INSUFFICIENCY, LEGS 05/03/2010    Past Surgical History:  Procedure Laterality Date  . ABDOMINAL HYSTERECTOMY  2000  . APPENDECTOMY  2000  . BRAIN SURGERY    . BREAST BIOPSY Left 1994   Benign  . 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 CHOLECYSTECTOMY N/A 12/13/2015   Procedure: LAPAROSCOPIC CHOLECYSTECTOMY;  Surgeon: Mickeal Skinner, MD;  Location: [facility];  Service: General;  Laterality: N/A;  . INGUINAL HERNIA REPAIR Right 1987  . MELANOMA EXCISION  2015   "skin of my stomach"  . OOPHORECTOMY Bilateral 2000    Family History  Problem Relation Age of Onset  . Hyperlipidemia Mother   . Hypertension Mother   . Cancer - Cervical Mother   . Diabetes Father   . Melanoma Father   . Anxiety disorder Father   . Depression Father   . Melanoma Sister   . Anxiety disorder Sister   . Depression Sister   . Heart disease Paternal Grandmother   . Anxiety disorder Brother   . Depression Brother   . Breast cancer Paternal Aunt   . Heart disease Other        Mother side of family-several uncles   . Anxiety disorder Sister   . Depression Sister   . Colon cancer Neg Hx   . Stomach cancer Neg Hx     Social History   Tobacco Use  . Smoking status: Never Smoker  . Smokeless tobacco: Never Used  Substance Use Topics  . Alcohol use: No    Subjective:  Patient presents with concerns for cough/ fever which started last Friday; went to ER on 9/29- had normal CXR but opted not to wait for treatment; feels like  symptoms have worsened in the past 2 days;  + productive cough; + fever; very lethargic- prefers to lie down in office; notes that her husband had similar symptoms but his symptoms resolved on their own.    Objective:  Vitals:   05/27/18 0825  BP: (!) 142/82  Pulse: 93  Temp: 98.3 F (36.8 C)  TempSrc: Oral  SpO2: 97%  Weight: 159 lb 1.9 oz (72.2 kg)  Height: 5\' 7"  (1.702 m)    General: Well developed, well nourished, in no acute distress; prefers to lie down during exam Skin : Warm and dry.  Head: Normocephalic and atraumatic  Eyes: Sclera and conjunctiva clear; pupils round and reactive to light; extraocular movements  intact  Ears: External normal; canals clear; tympanic membranes normal  Oropharynx: Pink, supple. No suspicious lesions  Neck: Supple without thyromegaly, adenopathy  Lungs: Respirations unlabored; wheezing noted in upper lobes CVS exam: normal rate and regular rhythm.  Neurologic: Alert and oriented; speech intact; face symmetrical; moves all extremities well; CNII-XII intact without focal deficit   Assessment:  1. Acute bronchitis, unspecified organism     Plan:  Reviewed CXR and labs from ER visit on Sunday; no pneumonia; Rx for Doxycycline 100 mg bid x 10 days, BREO 100 mg qd x 2 weeks, Rx for Tessalon Perles 100 mg tid prn; increase fluids, rest and follow-up worse, no better.   No follow-ups on file.  No orders of the defined types were placed in this encounter.   Requested Prescriptions   Signed Prescriptions Disp Refills  . doxycycline (VIBRA-TABS) 100 MG tablet 20 tablet 0    Sig: Take 1 tablet (100 mg total) by mouth 2 (two) times daily.  . benzonatate (TESSALON) 100 MG capsule 20 capsule 0    Sig: Take 1 capsule (100 mg total) by mouth 3 (three) times daily as needed.

## 2018-05-28 ENCOUNTER — Inpatient Hospital Stay: Admission: RE | Admit: 2018-05-28 | Payer: Self-pay | Source: Ambulatory Visit

## 2018-05-30 LAB — CULTURE, BLOOD (ROUTINE X 2)
CULTURE: NO GROWTH
SPECIAL REQUESTS: ADEQUATE

## 2018-06-02 ENCOUNTER — Telehealth: Payer: Self-pay | Admitting: Internal Medicine

## 2018-06-02 NOTE — Telephone Encounter (Signed)
Is she trying to lose weight? Her BMI is okay so she does not need to go through weight loss program.

## 2018-06-02 NOTE — Telephone Encounter (Signed)
Copied from [place] [id number]. Topic: Referral - Request >> Jun 02, 2018  1:30 PM Sheryl Lester, NT wrote: Reason for CRM: Patient called and would like  to go thru the healthy weight program thru [place] and she needs a referral please advise [phone]

## 2018-06-02 NOTE — Telephone Encounter (Signed)
LVM for patient to call back for MD response  

## 2018-06-03 ENCOUNTER — Ambulatory Visit: Payer: Self-pay | Admitting: Nurse Practitioner

## 2018-06-10 ENCOUNTER — Encounter: Payer: Self-pay | Admitting: Internal Medicine

## 2018-06-10 ENCOUNTER — Ambulatory Visit: Payer: Self-pay

## 2018-06-10 ENCOUNTER — Ambulatory Visit: Payer: PRIVATE HEALTH INSURANCE | Admitting: Internal Medicine

## 2018-06-10 VITALS — BP 170/100 | HR 76 | Temp 98.4°F | Ht 67.0 in | Wt 163.0 lb

## 2018-06-10 DIAGNOSIS — R1031 Right lower quadrant pain: Secondary | ICD-10-CM

## 2018-06-10 DIAGNOSIS — G500 Trigeminal neuralgia: Secondary | ICD-10-CM | POA: Diagnosis not present

## 2018-06-10 MED ORDER — PREDNISONE 20 MG PO TABS: 40.0000 mg | ORAL_TABLET | Freq: Every day | ORAL | 0 refills | Status: DC

## 2018-06-10 MED ORDER — KETOROLAC TROMETHAMINE 30 MG/ML IJ SOLN
30.0000 mg | Freq: Once | INTRAMUSCULAR | Status: AC
  Administered 2018-06-10: 30 mg via INTRAMUSCULAR

## 2018-06-10 NOTE — Patient Instructions (Signed)
We have sent in prednisone to take 2 pills a day for 4 days.   We have given you toradol today and you can come back to get toradol with the nurse before the pain visit if it helps.

## 2018-06-10 NOTE — Telephone Encounter (Signed)
Noted  

## 2018-06-10 NOTE — Telephone Encounter (Signed)
Pt. Reports she has chronic pain and is seen in [place] at a Pain Clinc. Had pain all night in her right arm, leg, side and ear .Pain is a 8-9 on 1-10 scale. Has her Butrans patch on. 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 Requests to see Dr. Sharlet Salina today. Appointment made. Is currently using ice packs and heat to help relieve pain. Reason for Disposition . [1] MODERATE pain (e.g., interferes with normal activities) AND [2] present > 3 days  Answer Assessment - Initial Assessment Questions 1. ONSET: "When did the pain start?"     Last night 2. LOCATION: "Where is the pain located?"     Right arm, leg, ear 3. PAIN: "How bad is the pain?" (Scale 1-10; or mild, moderate, severe)   - MILD (1-3): doesn't interfere with normal activities   - MODERATE (4-7): interferes with normal activities (e.g., work or school) or awakens from sleep   - SEVERE (8-10): excruciating pain, unable to do any normal activities, unable to hold a cup of water     8-9 4. WORK OR EXERCISE: "Has there been any recent work or exercise that involved this part of the body?"     No 5. CAUSE: "What do you think is causing the arm pain?"     Peripheral neuroglia  6. OTHER SYMPTOMS: "Do you have any other symptoms?" (e.g., neck pain, swelling, rash, fever, numbness, weakness)     No 7. PREGNANCY: "Is there any chance you are pregnant?" "When was your last menstrual period?"     No  Protocols used: ARM PAIN-A-AH
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
## 2018-06-11 ENCOUNTER — Telehealth: Payer: Self-pay

## 2018-06-11 ENCOUNTER — Ambulatory Visit (INDEPENDENT_AMBULATORY_CARE_PROVIDER_SITE_OTHER): Payer: PRIVATE HEALTH INSURANCE

## 2018-06-11 DIAGNOSIS — R1031 Right lower quadrant pain: Secondary | ICD-10-CM | POA: Diagnosis not present

## 2018-06-11 MED ORDER — KETOROLAC TROMETHAMINE 30 MG/ML IJ SOLN
30.0000 mg | Freq: Once | INTRAMUSCULAR | Status: AC
  Administered 2018-06-11: 30 mg via INTRAMUSCULAR

## 2018-06-11 MED ORDER — METHYLPREDNISOLONE ACETATE 40 MG/ML IJ SUSP
40.0000 mg | Freq: Once | INTRAMUSCULAR | Status: AC
  Administered 2018-06-11: 40 mg via INTRAMUSCULAR

## 2018-06-11 NOTE — Telephone Encounter (Signed)
Copied from [place] [id number]. Topic: Appointment Scheduling - Scheduling Inquiry for Clinic >> Jun 11, 2018  7:56 AM Lennox Solders wrote: Reason for CRM: pt was seen yesterday and would like to come in today for toradol injection the nurse schedule is full today. Pt also would like to have prednisone injection. Pt has not started taking prednisone >> Jun 11, 2018  8:36 AM Para Skeans A wrote: Saw Dr.Crawford.

## 2018-06-11 NOTE — Telephone Encounter (Signed)
Appt made

## 2018-06-11 NOTE — Telephone Encounter (Signed)
I know you said she could get the toradol injection, but what about the depo injection (Im guessing that is what she is talking about)

## 2018-06-11 NOTE — Telephone Encounter (Signed)
Can you add patient to the nurse schedule for toradol 30 and Depo 40 please and thank you

## 2018-06-11 NOTE — Telephone Encounter (Signed)
Copied from [place] [id number] [id number]. Topic: Appointment Scheduling - Scheduling Inquiry for Clinic >> Jun 11, 2018  7:56 AM Lennox Solders wrote: Reason for CRM: pt was seen yesterday and would like to come in today for toradol injection the nurse schedule is full today. Pt also would like to have prednisone injection. Pt has not started taking prednisone >> Jun 11, 2018  8:36 AM Para Skeans A wrote: Saw Dr.Crawford. >> Jun 11, 2018 10:48 AM Hewitt Shorts wrote: Pt has questions about taking the prednisone the pills that were prescribed since she is wanting to get a prednisone shot and also wants to know if she is being able to eat something and it not effect any thng

## 2018-06-11 NOTE — Telephone Encounter (Signed)
Fine depo-medrol 40 mg IM and toradol 30 mg IM for same dx as yesterday

## 2018-06-11 NOTE — Telephone Encounter (Signed)
Would recommend to start taking oral prednisone along with the shot. This should not affect eating although some people do have more appetite while taking steroids.

## 2018-06-12 DIAGNOSIS — R1031 Right lower quadrant pain: Secondary | ICD-10-CM | POA: Insufficient documentation

## 2018-06-12 NOTE — Assessment & Plan Note (Signed)
Given rx for prednisone and toradol 30 mg IM given today. Can get daily until follow up with pain clinic on Thursday.

## 2018-06-12 NOTE — Assessment & Plan Note (Signed)
Given toradol 30 mg IM and rx for prednisone to treat likely flare or bursitis.

## 2018-06-12 NOTE — Progress Notes (Signed)
Medical treatment/procedure(s) were performed by non-physician practitioner and as supervising physician I was immediately available for consultation/collaboration. I agree with above. Elizabeth A Crawford, MD  

## 2018-06-16 ENCOUNTER — Telehealth: Payer: Self-pay | Admitting: Internal Medicine

## 2018-06-16 NOTE — Telephone Encounter (Signed)
This is not a medication on her medication list that Dr.Crawford is currently giving the patient.   LVM to inform the patient she needs to set up an OV to speak with the provider about this.

## 2018-06-16 NOTE — Telephone Encounter (Signed)
This should not be given more than 5 days in a row generally speaking.

## 2018-06-16 NOTE — Telephone Encounter (Signed)
Copied from [place] [id number]. Topic: Quick Communication - Rx Refill/Question >> Jun 16, 2018  8:34 AM Blase Mess A wrote: Medication: predniSONE (DELTASONE) 20 MG tablet [[id number]]    Please advise with the patient needs to come back in to Dr. Sharlet Salina for pain in face, chest, r leg-radiating pain for the last 3 days.  Last Prednisone taken last Friday.  Has the patient contacted their pharmacy? Yes  (Agent: If no, request that the patient contact the pharmacy for the refill.) (Agent: If yes, when and what did the pharmacy advise?)  Preferred Pharmacy (with phone number or street name): [id number] - [place], [place] [address] [place] [address] Phone: [phone] Fax: [phone]    Agent: Please be advised that RX refills may take up to 3 business days. We ask that you follow-up with your pharmacy.

## 2018-06-16 NOTE — Telephone Encounter (Signed)
Patient states that she is trying to get in with rheumatologist and waiting for a call back from the pain clinic Want to know if she can get a refill on at least the prednisone till she can get in with someone to diagnose her pain  States that she is back to feeling how she was when she came in once she was off the prednisone

## 2018-06-16 NOTE — Telephone Encounter (Signed)
Copied from [place] [id number]. Topic: Quick Communication - See Telephone Encounter >> Jun 16, 2018  8:37 AM Blase Mess A wrote: CRM for notification. See Telephone encounter for: 06/16/18.  Patient is requesting for Toradole 60mg .  When she want to the pain clinc and they gave her 60mg .

## 2018-06-16 NOTE — Telephone Encounter (Signed)
See 2nd telephone encounter from 06/16/18

## 2018-06-16 NOTE — Telephone Encounter (Signed)
Is patient to be continuing coming in for Toradol injections?

## 2018-06-17 NOTE — Telephone Encounter (Signed)
We should not refill prednisone. This is a one time course to see if it helps. Cannot be taken long term.

## 2018-06-17 NOTE — Telephone Encounter (Signed)
Can you call and make an appointment for patient if she wants to talk to Dr. Sharlet Salina about the pain, Dr. Sharlet Salina is not refilling the prednisone or ordering anymore toradol or depo injections.

## 2018-06-18 NOTE — Telephone Encounter (Signed)
FYI

## 2018-06-18 NOTE — Telephone Encounter (Signed)
Patient was informed with information and she states she isnt coming in for an appointment and she will get prednisone from another doctor she sees

## 2018-07-04 NOTE — Progress Notes (Deleted)
[name] [address], [place] [address] Phone: [phone] Subjective:    I'm seeing this patient by the request  of:    CC:   [id number]  Sheryl Lester is a 62 y.o. female coming in with complaint of ***  Onset-  Location Duration-  Character- Aggravating factors- Reliving factors-  Therapies tried-  Severity-     Past Medical History:  Diagnosis Date  . ALLERGIC RHINITIS 01/20/2008  . ANEMIA-IRON DEFICIENCY 01/20/2008  . ANXIETY 01/20/2008  . ASTHMATIC BRONCHITIS, ACUTE 05/19/2008  . Bipolar disorder ([place])   . CENTRAL SLEEP APNEA CONDS CLASSIFIED ELSEWHERE 08/31/2010  . Complication of anesthesia 1987   "w/one of the anesthesia agents that they don't use anymore; chest muscles constricted; felt like I couldn't breath"  . DEPRESSION 01/20/2008  . DYSPHAGIA UNSPECIFIED 12/06/2008  . ELEVATED BLOOD PRESSURE WITHOUT DIAGNOSIS OF HYPERTENSION 12/06/2008  . GERD   . History of sleep walking   . HYPERLIPIDEMIA 01/20/2008  . HYPERTENSION 12/16/2008  . Melanoma ([place]) 2012   "skin of my stomach"  . Migraine    "related to menses; went away after hysterectomy"  . OSA treated with BiPAP   . OTITIS MEDIA, ACUTE, BILATERAL 12/16/2008  . OTITIS MEDIA, SEROUS, CHRONIC 12/30/2008  . PVD 05/12/2010  . Pyelonephritis "hospitalized in ~ 1991"  . SINUSITIS- ACUTE-NOS 09/21/2008  . SWELLING MASS OR LUMP IN HEAD AND NECK 09/21/2008  . THYROID NODULE, LEFT    "unable to aspirate; has gotten very small"   . Trigeminal neuralgia 01/20/2008  . Unspecified hearing loss 09/21/2008  . VENOUS INSUFFICIENCY, LEGS 05/03/2010   Past Surgical History:  Procedure Laterality Date  . ABDOMINAL HYSTERECTOMY  2000  . APPENDECTOMY  2000  . BRAIN SURGERY    . BREAST BIOPSY Left 1994   Benign  . 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 CHOLECYSTECTOMY N/A 12/13/2015   Procedure: LAPAROSCOPIC CHOLECYSTECTOMY;  Surgeon: Mickeal Skinner, MD;  Location: [facility];  Service: General;  Laterality: N/A;  . INGUINAL HERNIA REPAIR Right 1987  . MELANOMA EXCISION  2015   "skin of my stomach"  . OOPHORECTOMY Bilateral 2000   Social History   Socioeconomic History  . Marital status: Married    Spouse name: Not on file  . Number of children: 0  . Years of education: Not on file  . Highest education level: Not on file  Occupational History  . Occupation: disabled    Fish farm manager: UNEMPLOYED  Social Needs  . Financial resource strain: Not on file  . Food insecurity:    Worry: Not on file    Inability: Not on file  . Transportation needs:    Medical: Not on file    Non-medical: Not on file  Tobacco Use  . Smoking status: Never Smoker  . Smokeless tobacco: Never Used  Substance and Sexual Activity  . Alcohol use: No  . Drug use: No  . Sexual activity: Not Currently    Partners: Male    Birth control/protection: None  Lifestyle  . Physical activity:    Days per week: Not on file    Minutes per session: Not on file  . Stress: Not on file  Relationships  . Social connections:    Talks on phone: Not on file    Gets together: Not on file    Attends religious service: Not on file    Active member of club or  organization: Not on file    Attends meetings of clubs or organizations: Not on file    Relationship status: Not on file  Other Topics Concern  . Not on file  Social History Narrative  . Not on file   Allergies  Allergen Reactions  . Aripiprazole Rash and Swelling  . Fentanyl     Other reaction(s): Other Sleep Walking  . Avapro [Irbesartan] Other (See Comments)    hyponatremia  . Sulfamethoxazole-Trimethoprim Nausea And Vomiting  . Bupropion Rash  . Ibuprofen Nausea And Vomiting   Family History  Problem Relation Age of Onset  . Hyperlipidemia Mother   . Hypertension Mother   . Cancer - Cervical Mother   . Diabetes Father   . Melanoma Father   . Anxiety disorder Father   . Depression  Father   . Melanoma Sister   . Anxiety disorder Sister   . Depression Sister   . Heart disease Paternal Grandmother   . Anxiety disorder Brother   . Depression Brother   . Breast cancer Paternal Aunt   . Heart disease Other        Mother side of family-several uncles   . Anxiety disorder Sister   . Depression Sister   . Colon cancer Neg Hx   . Stomach cancer Neg Hx     Current Outpatient Medications (Endocrine & Metabolic):  .  predniSONE (DELTASONE) 20 MG tablet, Take 2 tablets (40 mg total) by mouth daily with breakfast.  Current Outpatient Medications (Cardiovascular):  .  metoprolol tartrate (LOPRESSOR) 50 MG tablet, Take 1 tablet (50 mg total) by mouth 2 (two) times daily.   Current Outpatient Medications (Analgesics):  Marland Kitchen  Buprenorphine (BUTRANS) 20 MCG/HR PTWK, Place 20 mcg onto the skin every Tuesday.  Marland Kitchen  BUTRANS 10 MCG/HR PTWK patch, Take 10 mcg by mouth daily as needed (for pain).  .  NUCYNTA 100 MG TABS, Take 250 mg by mouth 3 (three) times daily.   Current Outpatient Medications (Other):  .  clonazePAM (KLONOPIN) 0.5 MG tablet, Take 1 tablet (0.5 mg total) by mouth 3 (three) times daily as needed for anxiety. (Patient not taking: Reported on 06/10/2018) .  diazepam (VALIUM) 5 MG tablet, Take 5 mg by mouth 3 (three) times daily. Marland Kitchen  EQUETRO 300 MG CP12, Take 600 mg by mouth 2 (two) times daily.  .  mirtazapine (REMERON) 30 MG tablet, TAKE 1/2 TABLET AT BEDTIME FOR 3 NIGHTS THEN 1 AT BEDTIME .  NON FORMULARY, at bedtime. BI-Pap .  NONFORMULARY OR COMPOUNDED ITEM, Take 1 application by mouth 4 (four) times daily as needed. Topically on cheek and inside mouth of mouth to calm trigeminal nerve .  QUEtiapine (SEROQUEL) 200 MG tablet, Take 1 tablet (200 mg total) by mouth at bedtime. Marland Kitchen  tiZANidine (ZANAFLEX) 4 MG tablet, TK 1 T PO QHS FOR 2 WKS THEN CAN TK 1 T TID PRN .  Vilazodone HCl (VIIBRYD PO), Take 40 mg by mouth.     Past medical history, social, surgical and  family history all reviewed in electronic medical record.  No pertanent information unless stated regarding to the chief complaint.   Review of Systems:  No headache, visual changes, nausea, vomiting, diarrhea, constipation, dizziness, abdominal pain, skin rash, fevers, chills, night sweats, weight loss, swollen lymph nodes, body aches, joint swelling, muscle aches, chest pain, shortness of breath, mood changes.   Objective  There were no vitals taken for this visit. Systems examined below as of  General: No apparent distress alert and oriented x3 mood and affect normal, dressed appropriately.  HEENT: Pupils equal, extraocular movements intact  Respiratory: Patient's speak in full sentences and does not appear short of breath  Cardiovascular: No lower extremity edema, non tender, no erythema  Skin: Warm dry intact with no signs of infection or rash on extremities or on axial skeleton.  Abdomen: Soft nontender  Neuro: Cranial nerves II through XII are intact, neurovascularly intact in all extremities with 2+ DTRs and 2+ pulses.  Lymph: No lymphadenopathy of posterior or anterior cervical chain or axillae bilaterally.  Gait normal with good balance and coordination.  MSK:  Non tender with full range of motion and good stability and symmetric strength and tone of shoulders, elbows, wrist, hip, knee and ankles bilaterally.     Impression and Recommendations:     This case required medical decision making of moderate complexity. The above documentation has been reviewed and is accurate and complete Lyndal Pulley, DO       Note: This dictation was prepared with Dragon dictation along with smaller phrase technology. Any transcriptional errors that result from this process are unintentional.

## 2018-07-07 ENCOUNTER — Ambulatory Visit: Payer: Self-pay | Admitting: Family Medicine

## 2018-07-08 ENCOUNTER — Ambulatory Visit (INDEPENDENT_AMBULATORY_CARE_PROVIDER_SITE_OTHER): Payer: PRIVATE HEALTH INSURANCE | Admitting: Pulmonary Disease

## 2018-07-08 ENCOUNTER — Encounter: Payer: Self-pay | Admitting: Pulmonary Disease

## 2018-07-08 VITALS — BP 116/74 | HR 59 | Ht 67.0 in | Wt 163.6 lb

## 2018-07-08 DIAGNOSIS — F1190 Opioid use, unspecified, uncomplicated: Secondary | ICD-10-CM | POA: Diagnosis not present

## 2018-07-08 DIAGNOSIS — G4733 Obstructive sleep apnea (adult) (pediatric): Secondary | ICD-10-CM | POA: Diagnosis not present

## 2018-07-08 DIAGNOSIS — G4737 Central sleep apnea in conditions classified elsewhere: Secondary | ICD-10-CM | POA: Diagnosis not present

## 2018-07-08 NOTE — Assessment & Plan Note (Signed)
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 Presumably her symptoms are improved such as sleepwalking etc. so it is possible that central apneas are decreased and she generally does not need PAP therapy. Proceed with attended polysomnogram, unfortunately home sleep study will not characterize  central apneas.  Based on this we can decide whether she needs CPAP therapy or not. I discussed her narcotic treatment with her and apparently decreasing dosing does not seem to be an option

## 2018-07-08 NOTE — Progress Notes (Signed)
Subjective:    Patient ID: Sheryl Lester, female    DOB: 03-06-1956, 62 y.o.   MRN: [id number]  HPI  Chief Complaint  Patient presents with  . Sleep Consult    Self referral for central sleep apnea. Has had used her Bipap in 8 years. Wants to get another sleep study done.    62 year old never smoker with chronic pain due to severe trigeminal neuralgia presents for evaluation of sleep disordered breathing. She was diagnosed about 8 years ago on a sleep study with central apneas which was attributed to her using a fentanyl patch she was up to about 125 mcg/week.  She also had symptoms of sleepwalking then.  She was placed on a BiPAP machine but could never really settled down with a full facemask affecting her facial pain.  She has stopped using this machine over the last year.  She has a new BiPAP machine Her chronic pain is managed by Dr. Mechele Dawley at [facility] in [place].  Over the last few years she has transitioned to buprenorphine patch.  She uses Nucynta tablets for breakthrough pain. I clarified her other medications for anxiety and depression.  She is off Valium and Seroquel. She is also on Tegretol long-acting form.  Epworth sleepiness score is 5 and she denies excessive daytime somnolence and fatigue.  She does report some level of tiredness. Bedtime is between 10 11:30 PM, she falls asleep about half an hour after taking her antidepressant.  She sleeps on her back with 2 pillows, reports one awakening only closer to 5:30 AM and then cannot fall asleep again.  She sees ophthalmologist TV until she gets out of bed at 7 AM feeling tired without dryness of mouth or headaches There is no history suggestive of cataplexy, sleep paralysis or parasomnias  She has gained about 30 pounds in the last few years      Past Medical History:  Diagnosis Date  . ALLERGIC RHINITIS 01/20/2008  . ANEMIA-IRON DEFICIENCY 01/20/2008  . ANXIETY 01/20/2008  . ASTHMATIC BRONCHITIS, ACUTE  05/19/2008  . Bipolar disorder ([place])   . CENTRAL SLEEP APNEA CONDS CLASSIFIED ELSEWHERE 08/31/2010  . Complication of anesthesia 1987   "w/one of the anesthesia agents that they don't use anymore; chest muscles constricted; felt like I couldn't breath"  . DEPRESSION 01/20/2008  . DYSPHAGIA UNSPECIFIED 12/06/2008  . ELEVATED BLOOD PRESSURE WITHOUT DIAGNOSIS OF HYPERTENSION 12/06/2008  . GERD   . History of sleep walking   . HYPERLIPIDEMIA 01/20/2008  . HYPERTENSION 12/16/2008  . Melanoma ([place]) 2012   "skin of my stomach"  . Migraine    "related to menses; went away after hysterectomy"  . OSA treated with BiPAP   . OTITIS MEDIA, ACUTE, BILATERAL 12/16/2008  . OTITIS MEDIA, SEROUS, CHRONIC 12/30/2008  . PVD 05/12/2010  . Pyelonephritis "hospitalized in ~ 1991"  . SINUSITIS- ACUTE-NOS 09/21/2008  . SWELLING MASS OR LUMP IN HEAD AND NECK 09/21/2008  . THYROID NODULE, LEFT    "unable to aspirate; has gotten very small"   . Trigeminal neuralgia 01/20/2008  . Unspecified hearing loss 09/21/2008  . VENOUS INSUFFICIENCY, LEGS 05/03/2010    Past Surgical History:  Procedure Laterality Date  . ABDOMINAL HYSTERECTOMY  2000  . APPENDECTOMY  2000  . BRAIN SURGERY    . BREAST BIOPSY Left 1994   Benign  . 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 CHOLECYSTECTOMY N/A 12/13/2015   Procedure: LAPAROSCOPIC CHOLECYSTECTOMY;  Surgeon: Lurena Joiner  Sondra Come, MD;  Location: [facility];  Service: General;  Laterality: N/A;  . INGUINAL HERNIA REPAIR Right 1987  . MELANOMA EXCISION  2015   "skin of my stomach"  . OOPHORECTOMY Bilateral 2000    Allergies  Allergen Reactions  . Aripiprazole Rash and Swelling  . Fentanyl     Other reaction(s): Other Sleep Walking  . Avapro [Irbesartan] Other (See Comments)    hyponatremia  . Sulfamethoxazole-Trimethoprim Nausea And Vomiting  . Bupropion Rash  . Ibuprofen Nausea And Vomiting    Social History   Socioeconomic History  .  Marital status: Married    Spouse name: Not on file  . Number of children: 0  . Years of education: Not on file  . Highest education level: Not on file  Occupational History  . Occupation: disabled    Fish farm manager: UNEMPLOYED  Social Needs  . Financial resource strain: Not on file  . Food insecurity:    Worry: Not on file    Inability: Not on file  . Transportation needs:    Medical: Not on file    Non-medical: Not on file  Tobacco Use  . Smoking status: Never Smoker  . Smokeless tobacco: Never Used  Substance and Sexual Activity  . Alcohol use: No  . Drug use: No  . Sexual activity: Not Currently    Partners: Male    Birth control/protection: None  Lifestyle  . Physical activity:    Days per week: Not on file    Minutes per session: Not on file  . Stress: Not on file  Relationships  . Social connections:    Talks on phone: Not on file    Gets together: Not on file    Attends religious service: Not on file    Active member of club or organization: Not on file    Attends meetings of clubs or organizations: Not on file    Relationship status: Not on file  . Intimate partner violence:    Fear of current or ex partner: Not on file    Emotionally abused: Not on file    Physically abused: Not on file    Forced sexual activity: Not on file  Other Topics Concern  . Not on file  Social History Narrative  . Not on file     Family History  Problem Relation Age of Onset  . Hyperlipidemia Mother   . Hypertension Mother   . Cancer - Cervical Mother   . Diabetes Father   . Melanoma Father   . Anxiety disorder Father   . Depression Father   . Melanoma Sister   . Anxiety disorder Sister   . Depression Sister   . Heart disease Paternal Grandmother   . Anxiety disorder Brother   . Depression Brother   . Breast cancer Paternal Aunt   . Heart disease Other        Mother side of family-several uncles   . Anxiety disorder Sister   . Depression Sister   . Colon cancer Neg Hx    . Stomach cancer Neg Hx      Review of Systems  Constitutional: Negative for fever and unexpected weight change.  HENT: Positive for sore throat. Negative for congestion, dental problem, ear pain, nosebleeds, postnasal drip, rhinorrhea, sinus pressure, sneezing and trouble swallowing.   Eyes: Negative for redness and itching.  Respiratory: Negative for cough, chest tightness, shortness of breath and wheezing.   Cardiovascular: Negative for palpitations and leg swelling.  Gastrointestinal: Negative  for nausea and vomiting.  Genitourinary: Negative for dysuria.  Musculoskeletal: Negative for joint swelling.  Skin: Negative for rash.  Allergic/Immunologic: Negative.  Negative for environmental allergies, food allergies and immunocompromised state.  Neurological: Negative for headaches.  Hematological: Does not bruise/bleed easily.  Psychiatric/Behavioral: Negative for dysphoric mood. The patient is nervous/anxious.        Objective:   Physical Exam  Gen. Pleasant, well-nourished, in no distress,anxious affect ENT - no lesions, no post nasal drip Neck: No JVD, no thyromegaly, no carotid bruits Lungs: no use of accessory muscles, no dullness to percussion, clear without rales or rhonchi  Cardiovascular: Rhythm regular, heart sounds  normal, no murmurs or gallops, no peripheral edema Abdomen: soft and non-tender, no hepatosplenomegaly, BS normal. Musculoskeletal: No deformities, no cyanosis or clubbing Neuro:  alert, non focal       Assessment & Plan:

## 2018-07-08 NOTE — Patient Instructions (Signed)
Schedule attended sleep study for central apneas

## 2018-07-18 ENCOUNTER — Ambulatory Visit: Payer: PRIVATE HEALTH INSURANCE | Admitting: Internal Medicine

## 2018-07-18 ENCOUNTER — Encounter: Payer: Self-pay | Admitting: Internal Medicine

## 2018-07-18 ENCOUNTER — Other Ambulatory Visit (INDEPENDENT_AMBULATORY_CARE_PROVIDER_SITE_OTHER): Payer: PRIVATE HEALTH INSURANCE

## 2018-07-18 VITALS — BP 140/80 | HR 62 | Temp 98.3°F | Ht 67.0 in | Wt 165.0 lb

## 2018-07-18 DIAGNOSIS — E871 Hypo-osmolality and hyponatremia: Secondary | ICD-10-CM

## 2018-07-18 DIAGNOSIS — Z23 Encounter for immunization: Secondary | ICD-10-CM

## 2018-07-18 LAB — COMPREHENSIVE METABOLIC PANEL
ALBUMIN: 4 g/dL (ref 3.5–5.2)
ALT: 58 U/L — ABNORMAL HIGH (ref 0–35)
AST: 41 U/L — ABNORMAL HIGH (ref 0–37)
Alkaline Phosphatase: 93 U/L (ref 39–117)
BUN: 19 mg/dL (ref 6–23)
CALCIUM: 8.3 mg/dL — AB (ref 8.4–10.5)
CHLORIDE: 95 meq/L — AB (ref 96–112)
CO2: 25 mEq/L (ref 19–32)
Creatinine, Ser: 0.63 mg/dL (ref 0.40–1.20)
GFR: 102.28 mL/min (ref >60.00)
Glucose, Bld: 114 mg/dL — ABNORMAL HIGH (ref 70–99)
Potassium: 4.7 mEq/L (ref 3.5–5.1)
SODIUM: 125 meq/L — AB (ref 135–145)
Total Bilirubin: 0.2 mg/dL (ref 0.2–1.2)
Total Protein: 6.6 g/dL (ref 6.0–8.3)

## 2018-07-18 LAB — CBC
HEMATOCRIT: 32.6 % — AB (ref 36.0–46.0)
Hemoglobin: 11 g/dL — ABNORMAL LOW (ref 12.0–15.0)
MCHC: 33.8 g/dL (ref 30.0–36.0)
MCV: 87.1 fl (ref 78.0–100.0)
PLATELETS: 295 10*3/uL (ref 150.0–400.0)
RBC: 3.74 Mil/uL — AB (ref 3.87–5.11)
RDW: 13.3 % (ref 11.5–15.5)
WBC: 8.9 10*3/uL (ref 4.0–10.5)

## 2018-07-18 LAB — TSH: TSH: 0.56 u[IU]/mL (ref 0.35–4.50)

## 2018-07-18 LAB — VITAMIN B12: Vitamin B-12: 234 pg/mL (ref 211–911)

## 2018-07-18 LAB — T4, FREE: Free T4: 0.58 ng/dL — ABNORMAL LOW (ref 0.60–1.60)

## 2018-07-18 LAB — VITAMIN D 25 HYDROXY (VIT D DEFICIENCY, FRACTURES): VITD: 11.26 ng/mL — AB (ref 30.00–100.00)

## 2018-07-18 NOTE — Progress Notes (Signed)
   Subjective:    Patient ID: Sheryl Lester, female    DOB: 1956-05-30, 62 y.o.   MRN: [id number]  HPI The patient is a 62 YO female coming in for concerns about low sodium level at rheumatology. She is not sure how low the sodium was but she was advised to follow up with [place] for treatment. She has had chronically mildly low sodium levels. Denies feeling confusion, fatigue, muscle aches. She denies change in medication. She does have many medications and they have not changed dosage either. She denies fevers or chills. No recent surgery or trauma.   Review of Systems  Constitutional: Negative.   HENT: Negative.   Eyes: Negative.   Respiratory: Negative for cough, chest tightness and shortness of breath.   Cardiovascular: Negative for chest pain, palpitations and leg swelling.  Gastrointestinal: Negative for abdominal distention, abdominal pain, constipation, diarrhea, nausea and vomiting.  Musculoskeletal: Positive for arthralgias and myalgias.  Skin: Negative.   Neurological: Negative.   Psychiatric/Behavioral: Negative.       Objective:   Physical Exam  Constitutional: She is oriented to person, place, and time. She appears well-developed and well-nourished.  HENT:  Head: Normocephalic and atraumatic.  Eyes: EOM are normal.  Neck: Normal range of motion.  Cardiovascular: Normal rate and regular rhythm.  Pulmonary/Chest: Effort normal and breath sounds normal. No respiratory distress. She has no wheezes. She has no rales.  Abdominal: Soft. Bowel sounds are normal. She exhibits no distension. There is no tenderness. There is no rebound.  Musculoskeletal: She exhibits no edema.  Chronic facial pain  Neurological: She is alert and oriented to person, place, and time. Coordination normal.  Skin: Skin is warm and dry.   Vitals:   07/18/18 1419  BP: 140/80  Pulse: 62  Temp: 98.3 F (36.8 C)  TempSrc: Oral  SpO2: 97%  Weight: 165 lb (74.8 kg)  Height: 5\' 7"  (1.702 m)        Assessment & Plan:  Shingrix IM given at visit

## 2018-07-18 NOTE — Assessment & Plan Note (Signed)
Suspect some chronic hyponatremia from medications. Will check sodium level today as well as thyroid, vitamin levels. She appears euvolemic today on exam. No recent surgery or concerns about adrenal insufficiency (no recent steroids). Adjust as needed.

## 2018-07-18 NOTE — Patient Instructions (Signed)
We are checking blood work today.

## 2018-07-21 ENCOUNTER — Other Ambulatory Visit: Payer: Self-pay | Admitting: Internal Medicine

## 2018-07-21 DIAGNOSIS — E871 Hypo-osmolality and hyponatremia: Secondary | ICD-10-CM

## 2018-07-21 MED ORDER — LEVOTHYROXINE SODIUM 50 MCG PO TABS: 50.0000 ug | ORAL_TABLET | Freq: Every day | ORAL | 3 refills | Status: DC

## 2018-07-21 MED ORDER — VITAMIN D (ERGOCALCIFEROL) 1.25 MG (50000 UNIT) PO CAPS: 50000.0000 [IU] | ORAL_CAPSULE | ORAL | 0 refills | Status: DC

## 2018-07-23 ENCOUNTER — Ambulatory Visit (INDEPENDENT_AMBULATORY_CARE_PROVIDER_SITE_OTHER): Payer: PRIVATE HEALTH INSURANCE | Admitting: Emergency Medicine

## 2018-07-23 DIAGNOSIS — E538 Deficiency of other specified B group vitamins: Secondary | ICD-10-CM | POA: Diagnosis not present

## 2018-07-23 MED ORDER — CYANOCOBALAMIN 1000 MCG/ML IJ SOLN
1000.0000 ug | Freq: Once | INTRAMUSCULAR | Status: AC
  Administered 2018-07-23: 1000 ug via INTRAMUSCULAR

## 2018-07-23 NOTE — Progress Notes (Signed)
Medical treatment/procedure(s) were performed by non-physician practitioner and as supervising physician I was immediately available for consultation/collaboration. I agree with above.  A , MD  

## 2018-07-30 NOTE — Progress Notes (Signed)
[facility] [address], [place] [address] Phone: [phone] Subjective:     I Sheryl Lester am serving as a Education administrator for Dr. Hulan Saas.    CC: Right foot pain  SEG:[id number]  Sheryl Lester is a 62 y.o. female coming in with complaint of right foot pain. Not really pain just weakness. Broken foot October 2017 by falling down stairs. Lateral break. Wore a boot for about 7 weeks. Did not wear the boot the entire 7 weeks. Swelling in the ankle. Weakness. Sore first thing in the morning. No numbness and tingling noted. Sharp pain in the lateral ankle. Walks for exercise but has no pain. Groin pain.   Onset- Chronic Location- lateral ankle  Character- Sharp Aggravating factors- standing, driving Therapies tried- Elevation Severity-7 out of 10   Patient at that time did have a CT scan of the foot noted.  Fracture of the lateral malleolus and talus.  These were independently visualized by me.  Past Medical History:  Diagnosis Date  . ALLERGIC RHINITIS 01/20/2008  . ANEMIA-IRON DEFICIENCY 01/20/2008  . ANXIETY 01/20/2008  . ASTHMATIC BRONCHITIS, ACUTE 05/19/2008  . Bipolar disorder ([place])   . CENTRAL SLEEP APNEA CONDS CLASSIFIED ELSEWHERE 08/31/2010  . Complication of anesthesia 1987   "w/one of the anesthesia agents that they don't use anymore; chest muscles constricted; felt like I couldn't breath"  . DEPRESSION 01/20/2008  . DYSPHAGIA UNSPECIFIED 12/06/2008  . ELEVATED BLOOD PRESSURE WITHOUT DIAGNOSIS OF HYPERTENSION 12/06/2008  . GERD   . History of sleep walking   . HYPERLIPIDEMIA 01/20/2008  . HYPERTENSION 12/16/2008  . Melanoma ([place]) 2012   "skin of my stomach"  . Migraine    "related to menses; went away after hysterectomy"  . OSA treated with BiPAP   . OTITIS MEDIA, ACUTE, BILATERAL 12/16/2008  . OTITIS MEDIA, SEROUS, CHRONIC 12/30/2008  . PVD 05/12/2010  . Pyelonephritis "hospitalized in ~ 1991"  . SINUSITIS- ACUTE-NOS 09/21/2008  .  SWELLING MASS OR LUMP IN HEAD AND NECK 09/21/2008  . THYROID NODULE, LEFT    "unable to aspirate; has gotten very small"   . Trigeminal neuralgia 01/20/2008  . Unspecified hearing loss 09/21/2008  . VENOUS INSUFFICIENCY, LEGS 05/03/2010   Past Surgical History:  Procedure Laterality Date  . ABDOMINAL HYSTERECTOMY  2000  . APPENDECTOMY  2000  . BRAIN SURGERY    . BREAST BIOPSY Left 1994   Benign  . 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 CHOLECYSTECTOMY N/A 12/13/2015   Procedure: LAPAROSCOPIC CHOLECYSTECTOMY;  Surgeon: Mickeal Skinner, MD;  Location: [facility];  Service: General;  Laterality: N/A;  . INGUINAL HERNIA REPAIR Right 1987  . MELANOMA EXCISION  2015   "skin of my stomach"  . OOPHORECTOMY Bilateral 2000   Social History   Socioeconomic History  . Marital status: Married    Spouse name: Not on file  . Number of children: 0  . Years of education: Not on file  . Highest education level: Not on file  Occupational History  . Occupation: disabled    Fish farm manager: UNEMPLOYED  Social Needs  . Financial resource strain: Not on file  . Food insecurity:    Worry: Not on file    Inability: Not on file  . Transportation needs:    Medical: Not on file    Non-medical: Not on file  Tobacco Use  . Smoking status: Never Smoker  . Smokeless tobacco: Never Used  Substance and Sexual Activity  . Alcohol use: No  . Drug use: No  . Sexual activity: Not Currently    Partners: Male    Birth control/protection: None  Lifestyle  . Physical activity:    Days per week: Not on file    Minutes per session: Not on file  . Stress: Not on file  Relationships  . Social connections:    Talks on phone: Not on file    Gets together: Not on file    Attends religious service: Not on file    Active member of club or organization: Not on file    Attends meetings of clubs or organizations: Not on file    Relationship status: Not on file  Other Topics  Concern  . Not on file  Social History Narrative  . Not on file   Allergies  Allergen Reactions  . Aripiprazole Rash and Swelling  . Fentanyl     Other reaction(s): Other Sleep Walking  . Avapro [Irbesartan] Other (See Comments)    hyponatremia  . Sulfamethoxazole-Trimethoprim Nausea And Vomiting  . Bupropion Rash  . Ibuprofen Nausea And Vomiting   Family History  Problem Relation Age of Onset  . Hyperlipidemia Mother   . Hypertension Mother   . Cancer - Cervical Mother   . Diabetes Father   . Melanoma Father   . Anxiety disorder Father   . Depression Father   . Melanoma Sister   . Anxiety disorder Sister   . Depression Sister   . Heart disease Paternal Grandmother   . Anxiety disorder Brother   . Depression Brother   . Breast cancer Paternal Aunt   . Heart disease Other        Mother side of family-several uncles   . Anxiety disorder Sister   . Depression Sister   . Colon cancer Neg Hx   . Stomach cancer Neg Hx     Current Outpatient Medications (Endocrine & Metabolic):  .  levothyroxine (SYNTHROID, LEVOTHROID) 50 MCG tablet, Take 1 tablet (50 mcg total) by mouth daily. .  predniSONE (DELTASONE) 50 MG tablet, Steroid taper, take by mouth:  75 mg on d1-2, 50 mg on d3-6, 25 mg on d7-12  Current Outpatient Medications (Cardiovascular):  .  metoprolol tartrate (LOPRESSOR) 50 MG tablet, Take 1 tablet (50 mg total) by mouth 2 (two) times daily.   Current Outpatient Medications (Analgesics):  Marland Kitchen  Buprenorphine (BUTRANS) 20 MCG/HR PTWK, Place 20 mcg onto the skin every Tuesday.  Marland Kitchen  BUTRANS 10 MCG/HR PTWK patch, Take 10 mcg by mouth daily as needed (for pain).  .  NUCYNTA 100 MG TABS, Take 250 mg by mouth 3 (three) times daily.   Current Outpatient Medications (Other):  .  busPIRone (BUSPAR) 30 MG tablet, TAKE 1/2 TABLET BY MOUTH TWICE A DAY FOR 1 WEEK,THEN INCREASE TO 1 TABLET TWICE A DAY .  EQUETRO 300 MG CP12, Take 600 mg by mouth 2 (two) times daily.  .   mirtazapine (REMERON) 30 MG tablet, TAKE 1/2 TABLET AT BEDTIME FOR 3 NIGHTS THEN 1 AT BEDTIME .  NON FORMULARY, at bedtime. BI-Pap .  NONFORMULARY OR COMPOUNDED ITEM, Take 1 application by mouth 4 (four) times daily as needed. Topically on cheek and inside mouth of mouth to calm trigeminal nerve .  OXcarbazepine (TRILEPTAL) 300 MG/5ML suspension, Take 79mL by mouth as-needed, swish-and-spit. Marland Kitchen  tiZANidine (ZANAFLEX) 4 MG tablet, TK 1 T PO QHS FOR 2 WKS THEN CAN TK 1 T TID PRN .  Vilazodone HCl (VIIBRYD PO), Take 40 mg by mouth.  .  Vitamin D, Ergocalciferol, (DRISDOL) 1.25 MG (50000 UT) CAPS capsule, Take 1 capsule (50,000 Units total) by mouth every 7 (seven) days. .  Diclofenac Sodium 2 % SOLN, Place 2 g onto the skin 2 (two) times daily.    Past medical history, social, surgical and family history all reviewed in electronic medical record.  No pertanent information unless stated regarding to the chief complaint.   Review of Systems:  No headache, visual changes, nausea, vomiting, diarrhea, constipation, dizziness, abdominal pain, skin rash, fevers, chills, night sweats, weight loss, swollen lymph nodes, body aches, joint swelling, chest pain, shortness of breath, mood changes.  Positive muscle aches  Objective  Blood pressure (!) 152/80, pulse 65, height 5\' 7"  (1.702 m), weight 165 lb (74.8 kg), SpO2 97 %.   General: No apparent distress alert and oriented x3 mood and affect normal, dressed appropriately.  HEENT: Pupils equal, extraocular movements intact  Respiratory: Patient's speak in full sentences and does not appear short of breath  Cardiovascular: No lower extremity edema, non tender, no erythema  Skin: Warm dry intact with no signs of infection or rash on extremities or on axial skeleton.  Abdomen: Soft nontender  Neuro: Cranial nerves II through XII are intact, neurovascularly intact in all extremities with 2+ DTRs and 2+ pulses.  Lymph: No lymphadenopathy of posterior or  anterior cervical chain or axillae bilaterally.  Gai moderately antalgic MSK:  Non tender with full range of motion and good stability and symmetric strength and tone of shoulders, elbows, wrist, hip, knee bilaterally.  Right ankle has some significant degenerative changes.  Loss of range of motion in all planes.  Tender to palpation over the talar dome and the lateral malleolus.    MSK US performed of: Right ankle This study was ordered, performed, and interpreted by Charlann Boxer D.O.  Foot/Ankle:   Moderate to severe arthritic changes.  Patient does have a fibular arthritic changes.  No cartilage noted over the talar dome.  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 The above documentation has been reviewed and is accurate and complete Lyndal Pulley, DO       Note: This dictation was prepared with Dragon dictation along with smaller phrase technology. Any transcriptional errors that result from this process are unintentional.

## 2018-07-31 ENCOUNTER — Ambulatory Visit (INDEPENDENT_AMBULATORY_CARE_PROVIDER_SITE_OTHER)
Admission: RE | Admit: 2018-07-31 | Discharge: 2018-07-31 | Disposition: A | Discharge status: Home / Self Care | Payer: PRIVATE HEALTH INSURANCE | Source: Ambulatory Visit | Attending: Family Medicine | Admitting: Family Medicine

## 2018-07-31 ENCOUNTER — Encounter: Payer: Self-pay | Admitting: Family Medicine

## 2018-07-31 ENCOUNTER — Ambulatory Visit: Payer: PRIVATE HEALTH INSURANCE | Admitting: Family Medicine

## 2018-07-31 ENCOUNTER — Ambulatory Visit: Payer: Self-pay

## 2018-07-31 VITALS — BP 152/80 | HR 65 | Ht 67.0 in | Wt 165.0 lb

## 2018-07-31 DIAGNOSIS — M25571 Pain in right ankle and joints of right foot: Principal | ICD-10-CM

## 2018-07-31 DIAGNOSIS — G8929 Other chronic pain: Secondary | ICD-10-CM | POA: Diagnosis not present

## 2018-07-31 DIAGNOSIS — M19071 Primary osteoarthritis, right ankle and foot: Secondary | ICD-10-CM

## 2018-07-31 MED ORDER — DICLOFENAC SODIUM 2 % TD SOLN: 2.0000 g | Freq: Two times a day (BID) | TRANSDERMAL | 3 refills | Status: DC

## 2018-07-31 NOTE — Patient Instructions (Signed)
Good to see you  Xray downstairs Ice 20 minutes 2 times daily. Usually after activity and before bed. pennsaid pinkie amount topically 2 times daily as needed.  Good shoes with rigid bottom.  Jalene Mullet, [place]rell or New balance greater then 700 [email or web address] size large ankle compression sleeve.  Stay active but low impact exercises  pennsaid pinkie amount topically 2 times daily as needed.   Over the counter get  Vitamin D 2000 IU daily  Turmeric 500mg  daily  Tart cherry extract any dose at night See me again in 4 weeks

## 2018-07-31 NOTE — Assessment & Plan Note (Signed)
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  Some mild instability noted.  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  Repeat x-rays ordered.  Ultrasound shows arthritic changes

## 2018-08-04 ENCOUNTER — Telehealth: Payer: Self-pay

## 2018-08-04 NOTE — Telephone Encounter (Signed)
Spoke with patient and printed off copy of exercises for her to pick up. Patient voices understanding.

## 2018-08-04 NOTE — Telephone Encounter (Signed)
Copied from [place] [id number]. Topic: General - Inquiry >> Aug 04, 2018 11:29 AM Sheryl Lester wrote: Reason for CRM: pt called for website to order compression sleeve with I provided from [facility] notes 07/31/18 with Dr. Tamala Julian ([email or web address]). She misplaced the sheet with exercises Dr. Tamala Julian recommended and would like a new print out. She would like to come in to pick them up. Please call when ready.

## 2018-08-06 ENCOUNTER — Ambulatory Visit (INDEPENDENT_AMBULATORY_CARE_PROVIDER_SITE_OTHER): Payer: PRIVATE HEALTH INSURANCE

## 2018-08-06 DIAGNOSIS — E538 Deficiency of other specified B group vitamins: Secondary | ICD-10-CM | POA: Diagnosis not present

## 2018-08-06 MED ORDER — CYANOCOBALAMIN 1000 MCG/ML IJ SOLN
1000.0000 ug | Freq: Once | INTRAMUSCULAR | Status: AC
  Administered 2018-08-06: 1000 ug via INTRAMUSCULAR

## 2018-08-06 NOTE — Progress Notes (Signed)
Medical treatment/procedure(s) were performed by non-physician practitioner and as supervising physician I was immediately available for consultation/collaboration. I agree with above. Elizabeth A Crawford, MD  

## 2018-08-18 ENCOUNTER — Encounter (HOSPITAL_BASED_OUTPATIENT_CLINIC_OR_DEPARTMENT_OTHER): Payer: Self-pay

## 2018-08-22 ENCOUNTER — Ambulatory Visit: Payer: Self-pay | Admitting: *Deleted

## 2018-08-22 ENCOUNTER — Ambulatory Visit: Payer: Self-pay

## 2018-08-22 NOTE — Telephone Encounter (Signed)
   Reason for Disposition . Sounds like a life-threatening emergency to the triager    Patient is out of town at hotel in [place]- she is crying and in pain- she started talking about tingling and numbness in her fingers of left hand- stopped call- advised patient to call 911 now- she states she will.  Answer Assessment - Initial Assessment Questions 1. LOCATION: "Where does it hurt?"      Front of head- states she feels like her head is in a vise 2. ONSET: "When did the headache start?" (Minutes, hours or days)      1 1/2 hours ago 3. PATTERN: "Does the pain come and go, or has it been constant since it started?"     constant 4. SEVERITY: "How bad is the pain?" and "What does it keep you from doing?"  (e.g., Scale 1-10; mild, moderate, or severe)   - MILD (1-3): doesn't interfere with normal activities    - MODERATE (4-7): interferes with normal activities or awakens from sleep    - SEVERE (8-10): excruciating pain, unable to do any normal activities        8-9 5. RECURRENT SYMPTOM: "Have you ever had headaches before?" If so, ask: "When was the last time?" and "What happened that time?"      Yes- migraines in the past 6. CAUSE: "What do you think is causing the headache?"     unknown 7. MIGRAINE: "Have you been diagnosed with migraine headaches?" If so, ask: "Is this headache similar?"      Yes- not like anything ever had 8. HEAD INJURY: "Has there been any recent injury to the head?"      no 9. OTHER SYMPTOMS: "Do you have any other symptoms?" (fever, stiff neck, eye pain, sore throat, cold symptoms)     Lower half of left numbness 10. PREGNANCY: "Is there any chance you are pregnant?" "When was your last menstrual period?"       n/a  Protocols used: HEADACHE-A-AH

## 2018-08-22 NOTE — Telephone Encounter (Signed)
fyi

## 2018-08-24 MED ORDER — LABETALOL HCL 5 MG/ML IV SOLN
10.00 | INTRAVENOUS | Status: DC
Start: ? — End: 2018-08-24

## 2018-08-24 MED ORDER — BUPRENORPHINE 10 MCG/HR TD PTWK
2.00 | MEDICATED_PATCH | TRANSDERMAL | Status: DC
Start: ? — End: 2018-08-24

## 2018-08-24 MED ORDER — METOPROLOL TARTRATE 50 MG PO TABS
50.00 | ORAL_TABLET | ORAL | Status: DC
Start: 2018-08-24 — End: 2018-08-24

## 2018-08-24 MED ORDER — BUSPIRONE HCL 15 MG PO TABS
30.00 | ORAL_TABLET | ORAL | Status: DC
Start: 2018-08-24 — End: 2018-08-24

## 2018-08-24 MED ORDER — CARBAMAZEPINE 100 MG PO CHEW
200.00 | CHEWABLE_TABLET | ORAL | Status: DC
Start: 2018-08-24 — End: 2018-08-24

## 2018-08-24 MED ORDER — MIRTAZAPINE 15 MG PO TBDP
30.00 | ORAL_TABLET | ORAL | Status: DC
Start: 2018-08-24 — End: 2018-08-24

## 2018-08-24 MED ORDER — LEVOTHYROXINE SODIUM 50 MCG PO TABS
50.00 | ORAL_TABLET | ORAL | Status: DC
Start: 2018-08-25 — End: 2018-08-24

## 2018-08-24 MED ORDER — TAPENTADOL HCL 50 MG PO TABS
100.00 | ORAL_TABLET | ORAL | Status: DC
Start: ? — End: 2018-08-24

## 2018-08-25 ENCOUNTER — Ambulatory Visit: Payer: Self-pay | Admitting: Internal Medicine

## 2018-08-25 ENCOUNTER — Ambulatory Visit: Payer: PRIVATE HEALTH INSURANCE

## 2018-08-25 NOTE — Telephone Encounter (Signed)
Patient was seen at [facility] on Friday with nausea and headache. She was admitted for low sodium and released 2 days later. This morning she began experiencing nausea after eating breakfast. Denies abd pain/HA/difficulty voiding/fever. Advice care reviewed per protocol. Symptoms reviewed that would need immediate evaluation. Appointment made for 08/28/18. Reason for Disposition . Unexplained nausea  Answer Assessment - Initial Assessment Questions 1. NAUSEA SEVERITY: "How bad is the nausea?" (e.g., mild, moderate, severe; dehydration, weight loss)   - MILD: loss of appetite without change in eating habits   - MODERATE: decreased oral intake without significant weight loss, dehydration, or malnutrition   - SEVERE: inadequate caloric or fluid intake, significant weight loss, symptoms of dehydration     mild 2. ONSET: "When did the nausea begin?"     30 minutes ago around 10:00a 3. VOMITING: "Any vomiting?" If so, ask: "How many times today?"     None yet 4. RECURRENT SYMPTOM: "Have you had nausea before?" If so, ask: "When was the last time?" "What happened that time?"     Yes, just discharged from hospital yesterday. 5. CAUSE: "What do you think is causing the nausea?"     Unsure. She had low sodium in the hospital over the weekend. 6. PREGNANCY: "Is there any chance you are pregnant?" (e.g., unprotected intercourse, missed birth control pill, broken condom)     no  Protocols used: NAUSEA-A-AH

## 2018-08-26 ENCOUNTER — Encounter (HOSPITAL_COMMUNITY): Payer: Self-pay | Admitting: Emergency Medicine

## 2018-08-26 ENCOUNTER — Ambulatory Visit (HOSPITAL_COMMUNITY)
Admission: EM | Admit: 2018-08-26 | Discharge: 2018-08-26 | Disposition: A | Discharge status: Home / Self Care | Payer: PRIVATE HEALTH INSURANCE | Attending: Emergency Medicine | Admitting: Emergency Medicine

## 2018-08-26 DIAGNOSIS — I10 Essential (primary) hypertension: Secondary | ICD-10-CM | POA: Diagnosis not present

## 2018-08-26 DIAGNOSIS — R110 Nausea: Secondary | ICD-10-CM | POA: Diagnosis not present

## 2018-08-26 DIAGNOSIS — E871 Hypo-osmolality and hyponatremia: Secondary | ICD-10-CM | POA: Diagnosis not present

## 2018-08-26 LAB — POCT I-STAT, CHEM 8
BUN: 16 mg/dL (ref 6–20)
CREATININE: 0.5 mg/dL (ref 0.44–1.00)
Calcium, Ion: 0.92 mmol/L — ABNORMAL LOW (ref 1.15–1.40)
Chloride: 99 mmol/L (ref 98–111)
Glucose, Bld: 87 mg/dL (ref 70–99)
HCT: 42 % (ref 36.0–46.0)
HEMOGLOBIN: 14.3 g/dL (ref 12.0–15.0)
Potassium: 5.1 mmol/L (ref 3.5–5.1)
Sodium: 126 mmol/L — ABNORMAL LOW (ref 135–145)
TCO2: 25 mmol/L (ref 22–32)

## 2018-08-26 NOTE — ED Provider Notes (Signed)
[facility]    CSN: [id number] Arrival date & time: 08/26/18  1118     History   Chief Complaint Chief Complaint  Patient presents with  . Nausea  . Headache    HPI Sheryl Lester is a 62 y.o. female.   Pt is here to have her sodium level taken today. She was just at [facility] for na of 115. Was sent home with a baseline of na of 130. States that her patch also was to be change of butran 3 days ago and she has not changed it. bp elevate with headache and some nause. Denies any sob, no chest pain, no one side weakness no slurred speech. . Very familiar with hx. Has not taken anything pta. Does have refill on med at home. Family at bedside.      Past Medical History:  Diagnosis Date  . ALLERGIC RHINITIS 01/20/2008  . ANEMIA-IRON DEFICIENCY 01/20/2008  . ANXIETY 01/20/2008  . ASTHMATIC BRONCHITIS, ACUTE 05/19/2008  . Bipolar disorder ([place])   . CENTRAL SLEEP APNEA CONDS CLASSIFIED ELSEWHERE 08/31/2010  . Complication of anesthesia 1987   "w/one of the anesthesia agents that they don't use anymore; chest muscles constricted; felt like I couldn't breath"  . DEPRESSION 01/20/2008  . DYSPHAGIA UNSPECIFIED 12/06/2008  . ELEVATED BLOOD PRESSURE WITHOUT DIAGNOSIS OF HYPERTENSION 12/06/2008  . GERD   . History of sleep walking   . HYPERLIPIDEMIA 01/20/2008  . HYPERTENSION 12/16/2008  . Melanoma ([place]) 2012   "skin of my stomach"  . Migraine    "related to menses; went away after hysterectomy"  . OSA treated with BiPAP   . OTITIS MEDIA, ACUTE, BILATERAL 12/16/2008  . OTITIS MEDIA, SEROUS, CHRONIC 12/30/2008  . PVD 05/12/2010  . Pyelonephritis "hospitalized in ~ 1991"  . SINUSITIS- ACUTE-NOS 09/21/2008  . SWELLING MASS OR LUMP IN HEAD AND NECK 09/21/2008  . THYROID NODULE, LEFT    "unable to aspirate; has gotten very small"   . Trigeminal neuralgia 01/20/2008  . Unspecified hearing loss 09/21/2008  . VENOUS INSUFFICIENCY, LEGS 05/03/2010    Patient Active Problem List     Diagnosis Date Noted  . Arthritis of right ankle 07/31/2018  . Hyponatremia 07/18/2018  . Right inguinal pain 06/12/2018  . Sore throat 03/21/2018  . Generalized abdominal pain 02/25/2018  . 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 Bipolar disorder, in partial remission, most recent episode depressed ([place]) 11/20/2016  . Bipolar 1 disorder, depressed, moderate ([place]) 05/27/2016  . Rash and nonspecific skin eruption 03/21/2016  . Encounter for therapeutic drug monitoring 03/02/2016  . Cholecystitis with cholelithiasis 12/12/2015  . 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 Chronic pain in face 03/25/2015  . 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 Migraine with aura, intractable 04/03/2013  . Dysesthesia 02/19/2013  . Osteopenia 12/23/2012  . Intractable migraine 11/14/2012  . Skin lesion 09/23/2012  . Hearing loss 09/23/2012  . 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 Atypical facial pain 05/30/2012  . Facial pain 05/29/2012  . Spells 11/23/2011  . Chronic interstitial cystitis 11/14/2011  . Parasomnia 10/01/2011  . Melanoma ([place]) 07/05/2011  . Preventative health care 03/01/2011  . PVD 05/12/2010  . Essential hypertension 12/16/2008  . TIA 12/16/2008  . GERD 12/06/2008  . Unspecified hearing loss 09/21/2008  . HYPERLIPIDEMIA 01/20/2008  . ANEMIA-IRON DEFICIENCY 01/20/2008  . Trigeminal neuralgia 01/20/2008  . ALLERGIC RHINITIS 01/20/2008    Past Surgical History:  Procedure Laterality Date  . ABDOMINAL HYSTERECTOMY  2000  . APPENDECTOMY  2000  . BRAIN SURGERY    . BREAST BIOPSY Left 1994   Benign  . 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 CHOLECYSTECTOMY N/A 12/13/2015   Procedure: LAPAROSCOPIC CHOLECYSTECTOMY;  Surgeon: Mickeal Skinner, MD;  Location: [facility];  Service: General;  Laterality: N/A;  . INGUINAL HERNIA REPAIR Right 1987  . MELANOMA EXCISION  2015   "skin of my stomach"  . OOPHORECTOMY  Bilateral 2000    OB History   No obstetric history on file.      Home Medications    Prior to Admission medications   Medication Sig Start Date End Date Taking? Authorizing Provider  Buprenorphine (BUTRANS) 20 MCG/HR PTWK Place 20 mcg onto the skin every Tuesday.     [provider]  busPIRone (BUSPAR) 30 MG tablet TAKE 1/2 TABLET BY MOUTH TWICE A DAY FOR 1 WEEK,THEN INCREASE TO 1 TABLET TWICE A DAY 06/16/18   [provider]  BUTRANS 10 MCG/HR PTWK patch Take 10 mcg by mouth daily as needed (for pain).  11/30/15   [provider]  Diclofenac Sodium 2 % SOLN Place 2 g onto the skin 2 (two) times daily. 07/31/18   Lyndal Pulley, DO  EQUETRO 300 MG CP12 Take 600 mg by mouth 2 (two) times daily.  11/30/15   [provider]  levothyroxine (SYNTHROID, LEVOTHROID) 50 MCG tablet Take 1 tablet (50 mcg total) by mouth daily. 07/21/18   Hoyt Koch, MD  metoprolol tartrate (LOPRESSOR) 50 MG tablet Take 1 tablet (50 mg total) by mouth 2 (two) times daily. 04/14/18   Hoyt Koch, MD  mirtazapine (REMERON) 30 MG tablet TAKE 1/2 TABLET AT BEDTIME FOR 3 NIGHTS THEN 1 AT BEDTIME 04/09/18   [provider]  NON FORMULARY at bedtime. BI-Pap    [provider]  NONFORMULARY OR COMPOUNDED ITEM Take 1 application by mouth 4 (four) times daily as needed. Topically on cheek and inside mouth of mouth to calm trigeminal nerve 10/31/15   [provider]  NUCYNTA 100 MG TABS Take 250 mg by mouth 3 (three) times daily. 12/09/15   [provider]  OXcarbazepine (TRILEPTAL) 300 MG/5ML suspension Take 42mL by mouth as-needed, swish-and-spit. 07/14/18   [provider]  predniSONE (DELTASONE) 50 MG tablet Steroid taper, take by mouth:  75 mg on d1-2, 50 mg on d3-6, 25 mg on d7-12 07/14/18   [provider]  tiZANidine (ZANAFLEX) 4 MG tablet TK 1 T PO QHS FOR 2 WKS THEN CAN TK 1 T TID PRN 04/11/18   [provider]  Vilazodone HCl (VIIBRYD PO) Take 40 mg by mouth.     [provider]  Vitamin D, Ergocalciferol, (DRISDOL) 1.25 MG (50000 UT) CAPS capsule Take 1 capsule (50,000 Units total) by mouth every 7 (seven) days. 07/21/18   Hoyt Koch, MD    Family History Family History  Problem Relation Age of Onset  . Hyperlipidemia Mother   . Hypertension Mother   . Cancer - Cervical Mother   . Diabetes Father   . Melanoma Father   . Anxiety disorder Father   . Depression Father   . Melanoma Sister   . Anxiety disorder Sister   . Depression Sister   . Heart disease Paternal Grandmother   . Anxiety disorder Brother   . Depression Brother   . Breast cancer Paternal Aunt   . Heart disease Other  Mother side of family-several uncles   . Anxiety disorder Sister   . Depression Sister   . Colon cancer Neg Hx   . Stomach cancer Neg Hx     Social History Social History   Tobacco Use  . Smoking status: Never Smoker  . Smokeless tobacco: Never Used  Substance Use Topics  . Alcohol use: No  . Drug use: No     Allergies   Aripiprazole; Fentanyl; Avapro [irbesartan]; Sulfamethoxazole-trimethoprim; Bupropion; and Ibuprofen   Review of Systems Review of Systems  Constitutional: Negative.   HENT: Negative.   Eyes: Negative.   Respiratory: Negative.   Cardiovascular: Negative.   Gastrointestinal: Positive for nausea.  Endocrine: Negative.   Genitourinary: Negative.   Musculoskeletal: Negative.   Skin: Negative.   Neurological: Positive for headaches.     Physical Exam Triage Vital Signs ED Triage Vitals [08/26/18 1255]  Enc Vitals Group     BP (!) 202/102     Pulse Rate 71     Resp 18     Temp 98.2 F (36.8 C)     Temp Source Oral     SpO2 99 %     Weight      Height      Head Circumference      Peak Flow      Pain Score 5     Pain Loc      Pain Edu?      Excl. in [place]?    No data found.  Updated Vital Signs BP (!) 202/102 (BP Location: Left  Arm)   Pulse 71   Temp 98.2 F (36.8 C) (Oral)   Resp 18   SpO2 99%   Visual Acuity        Physical Exam Constitutional:      Appearance: She is well-developed.  HENT:     Head: Normocephalic.  Eyes:     Extraocular Movements: Extraocular movements intact.  Neck:     Musculoskeletal: Normal range of motion.  Cardiovascular:     Rate and Rhythm: Normal rate.  Pulmonary:     Effort: Pulmonary effort is normal.  Abdominal:     Palpations: Abdomen is soft.  Skin:    General: Skin is warm.  Neurological:     Mental Status: She is alert and oriented to person, place, and time.     GCS: GCS eye subscore is 4. GCS verbal subscore is 5. GCS motor subscore is 6.     Comments: Generalized headache, no facial droop, no weakness,   Psychiatric:        Mood and Affect: Mood normal.        Speech: Speech normal.        Behavior: Behavior normal.      UC Treatments / Results  Labs (all labs ordered are listed, but only abnormal results are displayed) Labs Reviewed  POCT I-STAT, CHEM 8 - Abnormal; Notable for the following components:      Result Value   Sodium 126 (*)    Calcium, Ion 0.92 (*)    All other components within normal limits  I-STAT CHEM 8, ED    EKG None  Radiology No results found.  Procedures Procedures (including critical care time)  Medications Ordered in UC Medications - No data to display  Initial Impression / Assessment and Plan / UC Course  I have reviewed the triage vital signs and the nursing notes.  Pertinent labs & imaging results that were available during my care  of the patient were reviewed by me and considered in my medical decision making (see chart for details).     Need to drink pedi light  If you have symptoms of slurred speech, chest pain, weakness to one side you need to go to the ER . You have a hx of low sodium you will need to follow up with your pcp  Change your butrans patch tomorrow or take your 10mg  po tablets today.    Obtain a blood pressure cuff to monitor your blood pressure.  Discussed pt with Aaron Edelman MD  Final Clinical Impressions(s) / UC Diagnoses   Final diagnoses:  Nausea  Essential hypertension  Low sodium levels     Discharge Instructions     Need to drink pedi light  If you have symptoms of slurred speech, chest pain, weakness to one side you need to go to the ER . You have a hx of low sodium you will need to follow up with your pcp your sodium was 126 today with your average of 130. You will need to go to the ER to have this treated.  Change your butrans patch tomorrow or take your 10mg  po tablets today.  Obtain a blood pressure cuff to monitor your blood pressure.     ED Prescriptions    None     Controlled Substance Prescriptions [place] Controlled Substance Registry consulted? Not Applicable   Marney Setting, NP 08/26/18 1343

## 2018-08-26 NOTE — Discharge Instructions (Addendum)
Need to drink pedi light  If you have symptoms of slurred speech, chest pain, weakness to one side you need to go to the ER . You have a hx of low sodium you will need to follow up with your pcp your sodium was 126 today with your average of 130. You will need to go to the ER to have this treated.  Change your butrans patch tomorrow or take your 10mg  po tablets today.  Obtain a blood pressure cuff to monitor your blood pressure.

## 2018-08-26 NOTE — ED Triage Notes (Signed)
Pt here for nausea and HA; pt was hospitalized last week for same; pt sts was hyponatremia and requesting sodium level checked

## 2018-08-28 ENCOUNTER — Other Ambulatory Visit (INDEPENDENT_AMBULATORY_CARE_PROVIDER_SITE_OTHER): Payer: PRIVATE HEALTH INSURANCE

## 2018-08-28 ENCOUNTER — Ambulatory Visit: Payer: PRIVATE HEALTH INSURANCE | Admitting: Internal Medicine

## 2018-08-28 ENCOUNTER — Encounter: Payer: Self-pay | Admitting: Internal Medicine

## 2018-08-28 VITALS — BP 130/90 | HR 61 | Temp 98.0°F | Ht 67.0 in | Wt 165.0 lb

## 2018-08-28 DIAGNOSIS — E871 Hypo-osmolality and hyponatremia: Secondary | ICD-10-CM | POA: Diagnosis not present

## 2018-08-28 DIAGNOSIS — E538 Deficiency of other specified B group vitamins: Secondary | ICD-10-CM | POA: Diagnosis not present

## 2018-08-28 DIAGNOSIS — H2513 Age-related nuclear cataract, bilateral: Secondary | ICD-10-CM | POA: Insufficient documentation

## 2018-08-28 DIAGNOSIS — H16421 Pannus (corneal), right eye: Secondary | ICD-10-CM | POA: Insufficient documentation

## 2018-08-28 LAB — COMPREHENSIVE METABOLIC PANEL
ALT: 29 U/L (ref 0–35)
AST: 20 U/L (ref 0–37)
Albumin: 3.9 g/dL (ref 3.5–5.2)
Alkaline Phosphatase: 108 U/L (ref 39–117)
BUN: 19 mg/dL (ref 6–23)
CALCIUM: 8.1 mg/dL — AB (ref 8.4–10.5)
CO2: 21 mEq/L (ref 19–32)
Chloride: 94 mEq/L — ABNORMAL LOW (ref 96–112)
Creatinine, Ser: 0.64 mg/dL (ref 0.40–1.20)
GFR: 100.4 mL/min (ref >60.00)
Glucose, Bld: 91 mg/dL (ref 70–99)
Potassium: 4.7 mEq/L (ref 3.5–5.1)
Sodium: 123 mEq/L — ABNORMAL LOW (ref 135–145)
Total Bilirubin: 0.2 mg/dL (ref 0.2–1.2)
Total Protein: 6.4 g/dL (ref 6.0–8.3)

## 2018-08-28 LAB — CBC
HCT: 34.4 % — ABNORMAL LOW (ref 36.0–46.0)
Hemoglobin: 11.8 g/dL — ABNORMAL LOW (ref 12.0–15.0)
MCHC: 34.2 g/dL (ref 30.0–36.0)
MCV: 86 fl (ref 78.0–100.0)
Platelets: 252 10*3/uL (ref 150.0–400.0)
RBC: 4 Mil/uL (ref 3.87–5.11)
RDW: 13 % (ref 11.5–15.5)
WBC: 6.6 10*3/uL (ref 4.0–10.5)

## 2018-08-28 LAB — TSH: TSH: 0.74 u[IU]/mL (ref 0.35–4.50)

## 2018-08-28 LAB — RENAL FUNCTION PANEL
Albumin: 3.9 g/dL (ref 3.5–5.2)
BUN: 19 mg/dL (ref 6–23)
CO2: 21 mEq/L (ref 19–32)
Calcium: 8.1 mg/dL — ABNORMAL LOW (ref 8.4–10.5)
Chloride: 94 mEq/L — ABNORMAL LOW (ref 96–112)
Creatinine, Ser: 0.64 mg/dL (ref 0.40–1.20)
GFR: 100.4 mL/min (ref >60.00)
Glucose, Bld: 91 mg/dL (ref 70–99)
POTASSIUM: 4.7 meq/L (ref 3.5–5.1)
Phosphorus: 4.3 mg/dL (ref 2.3–4.6)
Sodium: 123 mEq/L — ABNORMAL LOW (ref 135–145)

## 2018-08-28 LAB — T4, FREE: Free T4: 0.77 ng/dL (ref 0.60–1.60)

## 2018-08-28 LAB — VITAMIN D 25 HYDROXY (VIT D DEFICIENCY, FRACTURES): VITD: 33.22 ng/mL (ref 30.00–100.00)

## 2018-08-28 LAB — BRAIN NATRIURETIC PEPTIDE: Pro B Natriuretic peptide (BNP): 305 pg/mL — ABNORMAL HIGH (ref 0.0–100.0)

## 2018-08-28 MED ORDER — CYANOCOBALAMIN 1000 MCG/ML IJ SOLN
1000.0000 ug | Freq: Once | INTRAMUSCULAR | Status: AC
  Administered 2018-08-28: 1000 ug via INTRAMUSCULAR

## 2018-08-28 NOTE — Patient Instructions (Signed)
We are checking the labs today and will call you back.   We have given you the B12 shot today.

## 2018-08-28 NOTE — Progress Notes (Signed)
   Subjective:   Patient ID: Sheryl Lester, female    DOB: [birth year], 63 y.o.   MRN: [id number]  HPI The patient is a 63 YO female coming in for follow up of hospital ([facility] and nausea, uncontrolled hypertension, treated with BP meds and fluids with near normalization of sodium levels, suspected drinking too many low solute liquids and asked to stop so much water and diet cola, nephrology saw in hospital and she is to follow up with them). She has been to the ER twice since discharge from the hospital. Denies chest pains. She denies SOB. Is feeling okay today. Denies nausea or vomiting in the last several days. Is eating okay. Is drinking pedia lyte now.   Review of Systems  Constitutional: Positive for fatigue.  HENT: Negative.   Eyes: Negative.   Respiratory: Negative for cough, chest tightness and shortness of breath.   Cardiovascular: Negative for chest pain, palpitations and leg swelling.  Gastrointestinal: Negative for abdominal distention, abdominal pain, constipation, diarrhea, nausea and vomiting.  Musculoskeletal: Negative.   Skin: Negative.   Neurological: Negative.   Psychiatric/Behavioral: Negative.     Objective:  Physical Exam Constitutional:      Appearance: She is well-developed.  HENT:     Head: Normocephalic and atraumatic.  Neck:     Musculoskeletal: Normal range of motion.  Cardiovascular:     Rate and Rhythm: Normal rate and regular rhythm.  Pulmonary:     Effort: Pulmonary effort is normal. No respiratory distress.     Breath sounds: Normal breath sounds. No wheezing or rales.  Abdominal:     General: Bowel sounds are normal. There is no distension.     Palpations: Abdomen is soft.     Tenderness: There is no abdominal tenderness. There is no rebound.  Skin:    General: Skin is warm and dry.  Neurological:     Mental Status: She is alert and oriented to person, place, and time.     Coordination: Coordination normal.      Vitals:   08/28/18 1515  BP: 130/90  Pulse: 61  Temp: 98 F (36.7 C)  TempSrc: Oral  SpO2: 98%  Weight: 165 lb (74.8 kg)  Height: 5\' 7"  (1.702 m)    Assessment & Plan:  B12 shot given at visit

## 2018-08-29 ENCOUNTER — Telehealth: Payer: Self-pay

## 2018-08-29 ENCOUNTER — Other Ambulatory Visit: Payer: Self-pay | Admitting: Internal Medicine

## 2018-08-29 DIAGNOSIS — E538 Deficiency of other specified B group vitamins: Secondary | ICD-10-CM | POA: Insufficient documentation

## 2018-08-29 DIAGNOSIS — E871 Hypo-osmolality and hyponatremia: Secondary | ICD-10-CM

## 2018-08-29 DIAGNOSIS — R0600 Dyspnea, unspecified: Secondary | ICD-10-CM

## 2018-08-29 NOTE — Assessment & Plan Note (Addendum)
Checking sodium level, referral to nephrology. She is euvolemic today. Had hypothyroidism which is replaced and TSH and free T4 check today. No signs of adrenal insufficiency. B12 replaced. Vitamin D replacement and checking today. 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 She does not drink alcohol at all. She does have high BNP and if recheck is high will check echo to rule out heart failure although she does not have other signs of heart failure.

## 2018-08-29 NOTE — Telephone Encounter (Signed)
Copied from [place] [id number]. Topic: General - Other >> Aug 29, 2018  4:26 PM Rayann Heman wrote: Reason for CRM: pt called and stated that she would like lab results from yesterday 08/28/18. Pt states you call after 5 if needed. Please advise

## 2018-08-29 NOTE — Telephone Encounter (Signed)
Patient informed of lab results. 

## 2018-08-29 NOTE — Assessment & Plan Note (Signed)
B12 shot given at visit.  

## 2018-08-31 ENCOUNTER — Other Ambulatory Visit: Payer: Self-pay

## 2018-08-31 ENCOUNTER — Emergency Department (HOSPITAL_COMMUNITY): Payer: PRIVATE HEALTH INSURANCE

## 2018-08-31 ENCOUNTER — Encounter (HOSPITAL_COMMUNITY): Payer: Self-pay | Admitting: Emergency Medicine

## 2018-08-31 ENCOUNTER — Emergency Department (HOSPITAL_COMMUNITY)
Admission: EM | Admit: 2018-08-31 | Discharge: 2018-08-31 | Disposition: A | Discharge status: Home / Self Care | Payer: PRIVATE HEALTH INSURANCE | Attending: Emergency Medicine | Admitting: Emergency Medicine

## 2018-08-31 DIAGNOSIS — S52532A Colles' fracture of left radius, initial encounter for closed fracture: Secondary | ICD-10-CM | POA: Diagnosis not present

## 2018-08-31 DIAGNOSIS — S52612A Displaced fracture of left ulna styloid process, initial encounter for closed fracture: Secondary | ICD-10-CM | POA: Insufficient documentation

## 2018-08-31 DIAGNOSIS — I10 Essential (primary) hypertension: Secondary | ICD-10-CM | POA: Insufficient documentation

## 2018-08-31 DIAGNOSIS — Y9301 Activity, walking, marching and hiking: Secondary | ICD-10-CM | POA: Diagnosis not present

## 2018-08-31 DIAGNOSIS — W19XXXA Unspecified fall, initial encounter: Secondary | ICD-10-CM | POA: Diagnosis not present

## 2018-08-31 DIAGNOSIS — Y929 Unspecified place or not applicable: Secondary | ICD-10-CM | POA: Diagnosis not present

## 2018-08-31 DIAGNOSIS — Z79899 Other long term (current) drug therapy: Secondary | ICD-10-CM | POA: Diagnosis not present

## 2018-08-31 DIAGNOSIS — Z85820 Personal history of malignant melanoma of skin: Secondary | ICD-10-CM | POA: Insufficient documentation

## 2018-08-31 DIAGNOSIS — IMO0001 Reserved for inherently not codable concepts without codable children: Secondary | ICD-10-CM

## 2018-08-31 DIAGNOSIS — S6992XA Unspecified injury of left wrist, hand and finger(s), initial encounter: Secondary | ICD-10-CM | POA: Diagnosis present

## 2018-08-31 DIAGNOSIS — Y999 Unspecified external cause status: Secondary | ICD-10-CM | POA: Diagnosis not present

## 2018-08-31 DIAGNOSIS — M25531 Pain in right wrist: Secondary | ICD-10-CM

## 2018-08-31 DIAGNOSIS — Z9889 Other specified postprocedural states: Secondary | ICD-10-CM

## 2018-08-31 MED ORDER — HYDROMORPHONE HCL 1 MG/ML IJ SOLN
0.5000 mg | Freq: Once | INTRAMUSCULAR | Status: AC
  Administered 2018-08-31: 0.5 mg via INTRAVENOUS
  Filled 2018-08-31: qty 1

## 2018-08-31 MED ORDER — PROPOFOL 10 MG/ML IV BOLUS
INTRAVENOUS | Status: AC | PRN
  Administered 2018-08-31: 40 mg via INTRAVENOUS

## 2018-08-31 MED ORDER — PROPOFOL 10 MG/ML IV BOLUS
60.0000 mg | Freq: Once | INTRAVENOUS | Status: DC
  Filled 2018-08-31: qty 20

## 2018-08-31 MED ORDER — SODIUM CHLORIDE 0.9 % IV BOLUS
500.0000 mL | Freq: Once | INTRAVENOUS | Status: AC
  Administered 2018-08-31: 500 mL via INTRAVENOUS

## 2018-08-31 MED ORDER — KETAMINE HCL 50 MG/5ML IJ SOSY
60.0000 mg | PREFILLED_SYRINGE | Freq: Once | INTRAMUSCULAR | Status: DC
  Filled 2018-08-31: qty 10

## 2018-08-31 MED ORDER — KETAMINE HCL 10 MG/ML IJ SOLN
INTRAMUSCULAR | Status: AC | PRN
  Administered 2018-08-31: 40 mg via INTRAVENOUS

## 2018-08-31 NOTE — Progress Notes (Signed)
Orthopedic Tech Progress Note Patient Details:  LEATTA ALEWINE 16-Jun-1956 [id number] Apllied with ED Dr. Manson Passey Devices Type of Ortho Device: Arm sling, Sugartong splint Ortho Device/Splint Location: URE Ortho Device/Splint Interventions: Adjustment, Application, Ordered   Post Interventions Patient Tolerated: Well Instructions Provided: Care of device, Adjustment of device   Janit Pagan 08/31/2018, 2:18 AM

## 2018-08-31 NOTE — ED Notes (Signed)
Pt given 146mcg fentanyl PTA.

## 2018-08-31 NOTE — ED Provider Notes (Signed)
[facility] EMERGENCY DEPARTMENT Provider Note  CSN: [id number] Arrival date & time: 08/31/18 0001  Chief Complaint(s) Fall and Wrist Pain  HPI Sheryl Lester is a 63 y.o. female   The history is provided by the patient.  Wrist Pain  This is a new problem. Episode onset: 8 hrs. The problem occurs constantly. The problem has not changed since onset.Pertinent negatives include no chest pain, no abdominal pain, no headaches and no shortness of breath. Exacerbated by: movement and palpation. Relieved by: pain meds from [facility]   Lost her balance while standing up, causing her to fall. Denies other injuries or physical complaints. No Ha, neck pain, back pain, RUE or BLE pain.  Past Medical History Past Medical History:  Diagnosis Date  . ALLERGIC RHINITIS 01/20/2008  . ANEMIA-IRON DEFICIENCY 01/20/2008  . ANXIETY 01/20/2008  . ASTHMATIC BRONCHITIS, ACUTE 05/19/2008  . Bipolar disorder ([place])   . CENTRAL SLEEP APNEA CONDS CLASSIFIED ELSEWHERE 08/31/2010  . Complication of anesthesia 1987   "w/one of the anesthesia agents that they don't use anymore; chest muscles constricted; felt like I couldn't breath"  . DEPRESSION 01/20/2008  . DYSPHAGIA UNSPECIFIED 12/06/2008  . ELEVATED BLOOD PRESSURE WITHOUT DIAGNOSIS OF HYPERTENSION 12/06/2008  . GERD   . History of sleep walking   . HYPERLIPIDEMIA 01/20/2008  . HYPERTENSION 12/16/2008  . Melanoma ([place]) 2012   "skin of my stomach"  . Migraine    "related to menses; went away after hysterectomy"  . OSA treated with BiPAP   . OTITIS MEDIA, ACUTE, BILATERAL 12/16/2008  . OTITIS MEDIA, SEROUS, CHRONIC 12/30/2008  . PVD 05/12/2010  . Pyelonephritis "hospitalized in ~ 1991"  . SINUSITIS- ACUTE-NOS 09/21/2008  . SWELLING MASS OR LUMP IN HEAD AND NECK 09/21/2008  . THYROID NODULE, LEFT    "unable to aspirate; has gotten very small"   . Trigeminal neuralgia 01/20/2008  . Unspecified hearing loss 09/21/2008  . VENOUS INSUFFICIENCY, LEGS  05/03/2010   Patient Active Problem List   Diagnosis Date Noted  . B12 deficiency 08/29/2018  . Arthritis of right ankle 07/31/2018  . Hyponatremia 07/18/2018  . Generalized abdominal pain 02/25/2018  . 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 Bipolar disorder, in partial remission, most recent episode depressed ([place]) 11/20/2016  . Bipolar 1 disorder, depressed, moderate ([place]) 05/27/2016  . Rash and nonspecific skin eruption 03/21/2016  . Encounter for therapeutic drug monitoring 03/02/2016  . Cholecystitis with cholelithiasis 12/12/2015  . 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 Chronic pain in face 03/25/2015  . 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 Migraine with aura, intractable 04/03/2013  . Dysesthesia 02/19/2013  . Osteopenia 12/23/2012  . Intractable migraine 11/14/2012  . Skin lesion 09/23/2012  . Hearing loss 09/23/2012  . 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 Atypical facial pain 05/30/2012  . Facial pain 05/29/2012  . Spells 11/23/2011  . Chronic interstitial cystitis 11/14/2011  . Parasomnia 10/01/2011  . Melanoma ([place]) 07/05/2011  . Preventative health care 03/01/2011  . PVD 05/12/2010  . Essential hypertension 12/16/2008  . TIA 12/16/2008  . GERD 12/06/2008  . Unspecified hearing loss 09/21/2008  . HYPERLIPIDEMIA 01/20/2008  . ANEMIA-IRON DEFICIENCY 01/20/2008  . Trigeminal neuralgia 01/20/2008  . ALLERGIC RHINITIS 01/20/2008   Home Medication(s) Prior to Admission medications   Medication Sig Start Date End Date Taking? Authorizing Provider  Buprenorphine (BUTRANS) 20 MCG/HR PTWK Place 20 mcg onto the skin every Tuesday.     [provider]  busPIRone (BUSPAR) 30  MG tablet TAKE 1/2 TABLET BY MOUTH TWICE A DAY FOR 1 WEEK,THEN INCREASE TO 1 TABLET TWICE A DAY 06/16/18   [provider]  BUTRANS 10 MCG/HR PTWK patch Take 10 mcg by mouth daily as needed (for pain).  11/30/15   [provider]  Diclofenac  Sodium 2 % SOLN Place 2 g onto the skin 2 (two) times daily. 07/31/18   Lyndal Pulley, DO  EQUETRO 300 MG CP12 Take 600 mg by mouth 2 (two) times daily.  11/30/15   [provider]  levothyroxine (SYNTHROID, LEVOTHROID) 50 MCG tablet Take 1 tablet (50 mcg total) by mouth daily. 07/21/18   Hoyt Koch, MD  metoprolol tartrate (LOPRESSOR) 50 MG tablet Take 1 tablet (50 mg total) by mouth 2 (two) times daily. 04/14/18   Hoyt Koch, MD  mirtazapine (REMERON) 30 MG tablet TAKE 1/2 TABLET AT BEDTIME FOR 3 NIGHTS THEN 1 AT BEDTIME 04/09/18   [provider]  NON FORMULARY at bedtime. BI-Pap    [provider]  NONFORMULARY OR COMPOUNDED ITEM Take 1 application by mouth 4 (four) times daily as needed. Topically on cheek and inside mouth of mouth to calm trigeminal nerve 10/31/15   [provider]  NUCYNTA 100 MG TABS Take 250 mg by mouth 3 (three) times daily. 12/09/15   [provider]  OXcarbazepine (TRILEPTAL) 300 MG/5ML suspension Take 47mL by mouth as-needed, swish-and-spit. 07/14/18   [provider]  predniSONE (DELTASONE) 50 MG tablet Steroid taper, take by mouth:  75 mg on d1-2, 50 mg on d3-6, 25 mg on d7-12 07/14/18   [provider]  tiZANidine (ZANAFLEX) 4 MG tablet TK 1 T PO QHS FOR 2 WKS THEN CAN TK 1 T TID PRN 04/11/18   [provider]  Vilazodone HCl (VIIBRYD PO) Take 40 mg by mouth.     [provider]  Vitamin D, Ergocalciferol, (DRISDOL) 1.25 MG (50000 UT) CAPS capsule Take 1 capsule (50,000 Units total) by mouth every 7 (seven) days. 07/21/18   Hoyt Koch, MD                                                                                                                                    Past Surgical History Past Surgical History:  Procedure Laterality Date  . ABDOMINAL HYSTERECTOMY  2000  . APPENDECTOMY  2000  . BRAIN SURGERY    . BREAST BIOPSY Left 1994   Benign  . CEREBRAL  MICROVASCULAR DECOMPRESSION  1999   for chronic facial pain/ [facility]  . CHOLECYSTECTOMY N/A 12/13/2015   Procedure: LAPAROSCOPIC CHOLECYSTECTOMY;  Surgeon: Mickeal Skinner, MD;  Location: [facility];  Service: General;  Laterality: N/A;  . INGUINAL HERNIA REPAIR Right 1987  . MELANOMA EXCISION  2015   "skin of my stomach"  . OOPHORECTOMY Bilateral 2000   Family History Family History  Problem Relation Age of Onset  .  Hyperlipidemia Mother   . Hypertension Mother   . Cancer - Cervical Mother   . Diabetes Father   . Melanoma Father   . Anxiety disorder Father   . Depression Father   . Melanoma Sister   . Anxiety disorder Sister   . Depression Sister   . Heart disease Paternal Grandmother   . Anxiety disorder Brother   . Depression Brother   . Breast cancer Paternal Aunt   . Heart disease Other        Mother side of family-several uncles   . Anxiety disorder Sister   . Depression Sister   . Colon cancer Neg Hx   . Stomach cancer Neg Hx     Social History Social History   Tobacco Use  . Smoking status: Never Smoker  . Smokeless tobacco: Never Used  Substance Use Topics  . Alcohol use: No  . Drug use: No   Allergies Aripiprazole; Fentanyl; Avapro [irbesartan]; Sulfamethoxazole-trimethoprim; Bupropion; and Ibuprofen  Review of Systems Review of Systems  Respiratory: Negative for shortness of breath.   Cardiovascular: Negative for chest pain.  Gastrointestinal: Negative for abdominal pain.  Neurological: Negative for headaches.   All other systems are reviewed and are negative for acute change except as noted in the HPI  Physical Exam Vital Signs  I have reviewed the triage vital signs BP (!) 178/98 (BP Location: Left Arm)   Pulse 66   Temp 98.6 F (37 C) (Tympanic)   Resp 16   SpO2 95%   Physical Exam Constitutional:      General: She is not in acute distress.    Appearance: She is well-developed. She is not diaphoretic.     Interventions: Cervical collar  in place.  HENT:     Head: Normocephalic and atraumatic.     Right Ear: External ear normal.     Left Ear: External ear normal.     Nose: Nose normal.  Eyes:     General: No scleral icterus.       Right eye: No discharge.        Left eye: No discharge.     Conjunctiva/sclera: Conjunctivae normal.     Pupils: Pupils are equal, round, and reactive to light.  Neck:     Musculoskeletal: Normal range of motion and neck supple.  Cardiovascular:     Rate and Rhythm: Normal rate and regular rhythm.     Pulses:          Radial pulses are 2+ on the right side and 2+ on the left side.       Dorsalis pedis pulses are 2+ on the right side and 2+ on the left side.     Heart sounds: Normal heart sounds. No murmur. No friction rub. No gallop.   Pulmonary:     Effort: Pulmonary effort is normal. No respiratory distress.     Breath sounds: Normal breath sounds. No stridor. No wheezing.  Abdominal:     General: There is no distension.     Palpations: Abdomen is soft.     Tenderness: There is no abdominal tenderness.  Musculoskeletal:        General: No tenderness.     Cervical back: She exhibits no bony tenderness.     Thoracic back: She exhibits no bony tenderness.     Lumbar back: She exhibits no bony tenderness.     Comments: Clavicles stable. Chest stable to AP/Lat compression. Pelvis stable to Lat compression. No obvious extremity deformity. No  chest or abdominal wall contusion.  Skin:    General: Skin is warm and dry.     Findings: No erythema or rash.  Neurological:     Mental Status: She is alert and oriented to person, place, and time.     Comments: Moving all extremities     ED Results and Treatments Labs (all labs ordered are listed, but only abnormal results are displayed) Labs Reviewed - No data to display                                                                                                                       EKG  EKG Interpretation  Date/Time:      Ventricular Rate:    PR Interval:    QRS Duration:   QT Interval:    QTC Calculation:   R Axis:     Text Interpretation:        Radiology Dg Wrist 2 Views Right  Result Date: 08/31/2018 CLINICAL DATA:  Distal radius and ulna fractures, postreduction. EXAM: RIGHT WRIST - 2 VIEW COMPARISON:  Pre reduction radiographs earlier this day. FINDINGS: Improved alignment of comminuted distal radius fracture postreduction with minimal residual dorsal displacement of distal fracture fragment. Displaced ulna styloid fracture is in unchanged alignment. Overlying splint material in place. No new fracture. IMPRESSION: Improved alignment of comminuted distal radius fracture postreduction. Unchanged ulna styloid fracture. Electronically Signed   By: Keith Rake M.D.   On: 08/31/2018 02:46   Dg Wrist Complete Right  Result Date: 08/31/2018 CLINICAL DATA:  Status post fall, with acute onset of right wrist swelling. Initial encounter. EXAM: RIGHT WRIST - COMPLETE 3+ VIEW COMPARISON:  None. FINDINGS: There is a comminuted and impacted fracture of the distal radial metaphysis, with marked dorsal displacement, shortening and dorsal angulation. A displaced ulnar styloid fracture is also noted. The carpal rows articulate with the displaced distal radial fragments. Soft tissue swelling is noted about the wrist. The carpal rows appear grossly intact, and otherwise demonstrate normal alignment. IMPRESSION: Comminuted and impacted fracture of the distal radial metaphysis, with marked dorsal displacement, shortening and dorsal angulation. Displaced ulnar styloid fracture also noted. Electronically Signed   By: Garald Balding M.D.   On: 08/31/2018 01:12    Pertinent labs & imaging results that were available during my care of the patient were reviewed by me and considered in my medical decision making (see chart for details).  Medications Ordered in ED Medications  propofol (DIPRIVAN) 10 mg/mL bolus/IV push 60 mg (has  no administration in time range)  ketamine 50 mg in normal saline 5 mL (10 mg/mL) syringe (has no administration in time range)  HYDROmorphone (DILAUDID) injection 0.5 mg (0.5 mg Intravenous Given 08/31/18 0115)  sodium chloride 0.9 % bolus 500 mL (500 mLs Intravenous New Bag/Given 08/31/18 0114)  ketamine (KETALAR) injection (40 mg Intravenous Given 08/31/18 0159)  propofol (DIPRIVAN) 10 mg/mL bolus/IV push (40 mg Intravenous Given 08/31/18 0200)  Procedures .Ortho Injury Treatment Date/Time: 08/31/2018 2:32 AM Performed by: Fatima Blank, MD Authorized by: Fatima Blank, MD   Consent:    Consent obtained:  Written   Consent given by:  Patient   Risks discussed:  Fracture, irreducible dislocation, recurrent dislocation and restricted joint movement   Alternatives discussed:  Alternative treatment and immobilizationInjury location: wrist Location details: right wrist Injury type: fracture-dislocation Pre-procedure neurovascular assessment: neurovascularly intact  Patient sedated: Yes. Refer to sedation procedure documentation for details of sedation. Manipulation performed: yes Skeletal traction used: yes Reduction successful: yes X-ray confirmed reduction: yes Immobilization: splint and sling Splint type: sugar tong Supplies used: Ortho-Glass Post-procedure neurovascular assessment: post-procedure neurovascularly intact Patient tolerance: Patient tolerated the procedure well with no immediate complications      (including critical care time)  Medical Decision Making / ED Course I have reviewed the nursing notes for this encounter and the patient's prior records (if available in EHR or on provided paperwork).    The patient is alert and oriented, without evidence of intoxication. They have no midline cervical tenderness, distracting  injuries, or neuro deficits. Cervical spine cleared by Nexus criteria. Collar removed.  Right wrist pain from fall. Found to have Colles' fracture with displaced ulnar styloid fracture on plain film.  Fracture was reduced under conscious sedation and splinted.  Repeat plain film revealed significant improvement and alignment.  Final Clinical Impression(s) / ED Diagnoses Final diagnoses:  Right wrist pain  Closed Colles' fracture of left radius, initial encounter  Displaced fracture of left ulna styloid process, initial encounter for closed fracture   Disposition: Discharge  Condition: Good  I have discussed the results, Dx and Tx plan with the patient and husband who expressed understanding and agree(s) with the plan. Discharge instructions discussed at great length. The patient and husband were given strict return precautions who verbalized understanding of the instructions. No further questions at time of discharge.    ED Discharge Orders    None       Follow Up: Dayna Barker, MD [place] [id number] [phone]  Schedule an appointment as soon as possible for a visit  For close follow up to assess for right wrist fracture  Pain management  Call  for additional assistance with pain control beyond your current medication if needed      This chart was dictated using voice recognition software.  Despite best efforts to proofread,  errors can occur which can change the documentation meaning.   Fatima Blank, MD 08/31/18 [phone]

## 2018-08-31 NOTE — ED Triage Notes (Signed)
Pt BIB [facility], reports mechanical fall today at 1615, denies hitting her head, no LOC, denies neck/back pain. C-collar placed by EMS. Swelling noted to right wrist.

## 2018-09-01 ENCOUNTER — Encounter (HOSPITAL_COMMUNITY): Payer: Self-pay | Admitting: Emergency Medicine

## 2018-09-01 ENCOUNTER — Telehealth: Payer: Self-pay | Admitting: Internal Medicine

## 2018-09-01 ENCOUNTER — Emergency Department (HOSPITAL_COMMUNITY)
Admission: EM | Admit: 2018-09-01 | Discharge: 2018-09-01 | Disposition: A | Discharge status: Home / Self Care | Payer: PRIVATE HEALTH INSURANCE | Attending: Emergency Medicine | Admitting: Emergency Medicine

## 2018-09-01 DIAGNOSIS — I10 Essential (primary) hypertension: Secondary | ICD-10-CM | POA: Insufficient documentation

## 2018-09-01 DIAGNOSIS — Z4789 Encounter for other orthopedic aftercare: Secondary | ICD-10-CM | POA: Insufficient documentation

## 2018-09-01 DIAGNOSIS — E871 Hypo-osmolality and hyponatremia: Secondary | ICD-10-CM

## 2018-09-01 DIAGNOSIS — E785 Hyperlipidemia, unspecified: Secondary | ICD-10-CM | POA: Insufficient documentation

## 2018-09-01 DIAGNOSIS — Z79899 Other long term (current) drug therapy: Secondary | ICD-10-CM | POA: Insufficient documentation

## 2018-09-01 DIAGNOSIS — Z8673 Personal history of transient ischemic attack (TIA), and cerebral infarction without residual deficits: Secondary | ICD-10-CM | POA: Insufficient documentation

## 2018-09-01 DIAGNOSIS — R51 Headache: Secondary | ICD-10-CM | POA: Diagnosis present

## 2018-09-01 LAB — CBC WITH DIFFERENTIAL/PLATELET
Abs Immature Granulocytes: 0.01 10*3/uL (ref 0.00–0.07)
Basophils Absolute: 0 10*3/uL (ref 0.0–0.1)
Basophils Relative: 1 %
Eosinophils Absolute: 0.1 10*3/uL (ref 0.0–0.5)
Eosinophils Relative: 3 %
HCT: 33.6 % — ABNORMAL LOW (ref 36.0–46.0)
Hemoglobin: 11.3 g/dL — ABNORMAL LOW (ref 12.0–15.0)
IMMATURE GRANULOCYTES: 0 %
Lymphocytes Relative: 14 %
Lymphs Abs: 0.7 10*3/uL (ref 0.7–4.0)
MCH: 29.3 pg (ref 26.0–34.0)
MCHC: 33.6 g/dL (ref 30.0–36.0)
MCV: 87 fL (ref 80.0–100.0)
Monocytes Absolute: 0.7 10*3/uL (ref 0.1–1.0)
Monocytes Relative: 14 %
NEUTROS ABS: 3.5 10*3/uL (ref 1.7–7.7)
NRBC: 0 % (ref 0.0–0.2)
Neutrophils Relative %: 68 %
Platelets: 237 10*3/uL (ref 150–400)
RBC: 3.86 MIL/uL — ABNORMAL LOW (ref 3.87–5.11)
RDW: 12.1 % (ref 11.5–15.5)
WBC: 5.1 10*3/uL (ref 4.0–10.5)

## 2018-09-01 LAB — BASIC METABOLIC PANEL
ANION GAP: 8 (ref 5–15)
BUN: 11 mg/dL (ref 6–20)
CO2: 23 mmol/L (ref 22–32)
Calcium: 8.6 mg/dL — ABNORMAL LOW (ref 8.9–10.3)
Chloride: 97 mmol/L — ABNORMAL LOW (ref 98–111)
Creatinine, Ser: 0.59 mg/dL (ref 0.44–1.00)
GFR calc Af Amer: >60 mL/min (ref >60)
GFR calc non Af Amer: >60 mL/min (ref >60)
Glucose, Bld: 110 mg/dL — ABNORMAL HIGH (ref 70–99)
POTASSIUM: 4.3 mmol/L (ref 3.5–5.1)
Sodium: 128 mmol/L — ABNORMAL LOW (ref 135–145)

## 2018-09-01 NOTE — ED Provider Notes (Signed)
[facility] EMERGENCY DEPARTMENT Provider Note   CSN: [id number] Arrival date & time: 09/01/18  0801     History   Chief Complaint No chief complaint on file.   HPI Sheryl Lester is a 63 y.o. female.  HPI  63 year old female presents with tingling in her right hand as well as concern for a low sodium.  Just over 1 week ago she was admitted to an outside hospital for low sodium associated with headache.  The patient had to be admitted and her sodium came up to 134.  She was then discharged and has had her sodium rechecked and it seems to be dropping again though she is asymptomatic.  Then after bending over about 30 hours ago she fell and broke her right wrist.  This was reduced in the ER, splinted, and she was discharged home.  Last night she started noticing tingling in her right hand.  The fingers are little more swollen than the left.  There is also an associated tightness to her hand.  She has no numbness to the hand or extreme pain.  She has not been elevating her right upper extremity.  It is in a sling but she states is often been pointed somewhat downward.  She also states during that visit to the outside hospital a BNP was obtained and was elevated.  However she denies chest pain, shortness of breath, or leg swelling.  Past Medical History:  Diagnosis Date  . ALLERGIC RHINITIS 01/20/2008  . ANEMIA-IRON DEFICIENCY 01/20/2008  . ANXIETY 01/20/2008  . ASTHMATIC BRONCHITIS, ACUTE 05/19/2008  . Bipolar disorder ([place])   . CENTRAL SLEEP APNEA CONDS CLASSIFIED ELSEWHERE 08/31/2010  . Complication of anesthesia 1987   "w/one of the anesthesia agents that they don't use anymore; chest muscles constricted; felt like I couldn't breath"  . DEPRESSION 01/20/2008  . DYSPHAGIA UNSPECIFIED 12/06/2008  . ELEVATED BLOOD PRESSURE WITHOUT DIAGNOSIS OF HYPERTENSION 12/06/2008  . GERD   . History of sleep walking   . HYPERLIPIDEMIA 01/20/2008  . HYPERTENSION 12/16/2008  . Melanoma  (Sheryl Lester) 2012   "skin of my stomach"  . Migraine    "related to menses; went away after hysterectomy"  . OSA treated with BiPAP   . OTITIS MEDIA, ACUTE, BILATERAL 12/16/2008  . OTITIS MEDIA, SEROUS, CHRONIC 12/30/2008  . PVD 05/12/2010  . Pyelonephritis "hospitalized in ~ 1991"  . SINUSITIS- ACUTE-NOS 09/21/2008  . SWELLING MASS OR LUMP IN HEAD AND NECK 09/21/2008  . THYROID NODULE, LEFT    "unable to aspirate; has gotten very small"   . Trigeminal neuralgia 01/20/2008  . Unspecified hearing loss 09/21/2008  . VENOUS INSUFFICIENCY, LEGS 05/03/2010    Patient Active Problem List   Diagnosis Date Noted  . B12 deficiency 08/29/2018  . Arthritis of right ankle 07/31/2018  . Hyponatremia 07/18/2018  . Generalized abdominal pain 02/25/2018  . 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 Bipolar disorder, in partial remission, most recent episode depressed (Sheryl Lester) 11/20/2016  . Bipolar 1 disorder, depressed, moderate (Sheryl Lester) 05/27/2016  . Rash and nonspecific skin eruption 03/21/2016  . Encounter for therapeutic drug monitoring 03/02/2016  . Cholecystitis with cholelithiasis 12/12/2015  . 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 Chronic pain in face 03/25/2015  . 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 Migraine with aura, intractable 04/03/2013  . Dysesthesia 02/19/2013  . Osteopenia 12/23/2012  . Intractable migraine 11/14/2012  . Skin lesion 09/23/2012  . Hearing loss 09/23/2012  . 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  Atypical facial pain 05/30/2012  . Facial pain 05/29/2012  . Spells 11/23/2011  . Chronic interstitial cystitis 11/14/2011  . Parasomnia 10/01/2011  . Melanoma (Sheryl Lester) 07/05/2011  . Preventative health care 03/01/2011  . PVD 05/12/2010  . Essential hypertension 12/16/2008  . TIA 12/16/2008  . GERD 12/06/2008  . Unspecified hearing loss 09/21/2008  . HYPERLIPIDEMIA 01/20/2008  . ANEMIA-IRON DEFICIENCY 01/20/2008  . Trigeminal neuralgia 01/20/2008    . ALLERGIC RHINITIS 01/20/2008    Past Surgical History:  Procedure Laterality Date  . ABDOMINAL HYSTERECTOMY  2000  . APPENDECTOMY  2000  . BRAIN SURGERY    . BREAST BIOPSY Left 1994   Benign  . 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 CHOLECYSTECTOMY N/A 12/13/2015   Procedure: LAPAROSCOPIC CHOLECYSTECTOMY;  Surgeon: Mickeal Skinner, MD;  Location: [facility];  Service: General;  Laterality: N/A;  . INGUINAL HERNIA REPAIR Right 1987  . MELANOMA EXCISION  2015   "skin of my stomach"  . OOPHORECTOMY Bilateral 2000     OB History   No obstetric history on file.      Home Medications    Prior to Admission medications   Medication Sig Start Date End Date Taking? Authorizing Provider  Buprenorphine (BUTRANS) 20 MCG/HR PTWK Place 20 mcg onto the skin every Tuesday.    Yes [provider]  busPIRone (BUSPAR) 30 MG tablet Take 30 mg by mouth 2 (two) times daily.  06/16/18  Yes [provider]  BUTRANS 10 MCG/HR PTWK patch Take 10 mcg by mouth daily as needed (for pain).  11/30/15  Yes [provider]  EQUETRO 300 MG CP12 Take 600 mg by mouth 2 (two) times daily.  11/30/15  Yes [provider]  ibuprofen (ADVIL,MOTRIN) 200 MG tablet Take 400 mg by mouth every 6 (six) hours as needed (arm pain).   Yes [provider]  levothyroxine (SYNTHROID, LEVOTHROID) 50 MCG tablet Take 1 tablet (50 mcg total) by mouth daily. 07/21/18  Yes Hoyt Koch, MD  metoprolol tartrate (LOPRESSOR) 50 MG tablet Take 1 tablet (50 mg total) by mouth 2 (two) times daily. 04/14/18  Yes Hoyt Koch, MD  mirtazapine (REMERON) 30 MG tablet Take 30 mg by mouth at bedtime.  04/09/18  Yes [provider]  NUCYNTA 100 MG TABS Take 100 mg by mouth as needed (facial pain).  12/09/15  Yes [provider]  Vilazodone HCl (VIIBRYD PO) Take 40 mg by mouth every evening.    Yes [provider]  Vitamin D,  Ergocalciferol, (DRISDOL) 1.25 MG (50000 UT) CAPS capsule Take 1 capsule (50,000 Units total) by mouth every 7 (seven) days. Patient taking differently: Take 50,000 Units by mouth every Wednesday.  07/21/18  Yes Hoyt Koch, MD  Diclofenac Sodium 2 % SOLN Place 2 g onto the skin 2 (two) times daily. Patient not taking: Reported on 09/01/2018 07/31/18   Lyndal Pulley, DO  naloxone [facility]) nasal spray 4 mg/0.1 mL Place 1 spray into the nose as needed (for overdose).  06/12/18   [provider]    Family History Family History  Problem Relation Age of Onset  . Hyperlipidemia Mother   . Hypertension Mother   . Cancer - Cervical Mother   . Diabetes Father   . Melanoma Father   . Anxiety disorder Father   . Depression Father   . Melanoma Sister   . Anxiety disorder Sister   . Depression Sister   .  Heart disease Paternal Grandmother   . Anxiety disorder Brother   . Depression Brother   . Breast cancer Paternal Aunt   . Heart disease Other        Mother side of family-several uncles   . Anxiety disorder Sister   . Depression Sister   . Colon cancer Neg Hx   . Stomach cancer Neg Hx     Social History Social History   Tobacco Use  . Smoking status: Never Smoker  . Smokeless tobacco: Never Used  Substance Use Topics  . Alcohol use: No  . Drug use: No     Allergies   Aripiprazole; Fentanyl; Avapro [irbesartan]; Sulfamethoxazole-trimethoprim; Bupropion; and Ibuprofen   Review of Systems Review of Systems  Respiratory: Negative for shortness of breath.   Cardiovascular: Negative for leg swelling.  Musculoskeletal: Positive for arthralgias.  Neurological: Negative for weakness, numbness and headaches.  Psychiatric/Behavioral: Negative for confusion.  All other systems reviewed and are negative.    Physical Exam Updated Vital Signs BP (!) 188/99 (BP Location: Left Arm)   Pulse 65   Temp 98.7 F (37.1 C) (Oral)   Resp 20   SpO2 99%   Physical  Exam Vitals signs and nursing note reviewed.  Constitutional:      Appearance: She is well-developed.  HENT:     Head: Normocephalic and atraumatic.     Right Ear: External ear normal.     Left Ear: External ear normal.     Nose: Nose normal.  Eyes:     General:        Right eye: No discharge.        Left eye: No discharge.  Cardiovascular:     Rate and Rhythm: Normal rate and regular rhythm.     Heart sounds: Normal heart sounds.  Pulmonary:     Effort: Pulmonary effort is normal.     Breath sounds: Normal breath sounds.  Abdominal:     Palpations: Abdomen is soft.     Tenderness: There is no abdominal tenderness.  Musculoskeletal:     Comments: Right upper extremity is in a splint.  The right fingers are moderately swollen.  Normal color.  Brisk capillary refill.  Normal sensation.  Skin:    General: Skin is warm and dry.  Neurological:     Mental Status: She is alert and oriented to person, place, and time.  Psychiatric:        Mood and Affect: Mood is not anxious.      ED Treatments / Results  Labs (all labs ordered are listed, but only abnormal results are displayed) Labs Reviewed  BASIC METABOLIC PANEL - Abnormal; Notable for the following components:      Result Value   Sodium 128 (*)    Chloride 97 (*)    Glucose, Bld 110 (*)    Calcium 8.6 (*)    All other components within normal limits  CBC WITH DIFFERENTIAL/PLATELET - Abnormal; Notable for the following components:   RBC 3.86 (*)    Hemoglobin 11.3 (*)    HCT 33.6 (*)    All other components within normal limits    EKG None  Radiology Dg Wrist 2 Views Right  Result Date: 08/31/2018 CLINICAL DATA:  Distal radius and ulna fractures, postreduction. EXAM: RIGHT WRIST - 2 VIEW COMPARISON:  Pre reduction radiographs earlier this day. FINDINGS: Improved alignment of comminuted distal radius fracture postreduction with minimal residual dorsal displacement of distal fracture fragment. Displaced ulna styloid  fracture  is in unchanged alignment. Overlying splint material in place. No new fracture. IMPRESSION: Improved alignment of comminuted distal radius fracture postreduction. Unchanged ulna styloid fracture. Electronically Signed   By: Keith Rake M.D.   On: 08/31/2018 02:46   Dg Wrist Complete Right  Result Date: 08/31/2018 CLINICAL DATA:  Status post fall, with acute onset of right wrist swelling. Initial encounter. EXAM: RIGHT WRIST - COMPLETE 3+ VIEW COMPARISON:  None. FINDINGS: There is a comminuted and impacted fracture of the distal radial metaphysis, with marked dorsal displacement, shortening and dorsal angulation. A displaced ulnar styloid fracture is also noted. The carpal rows articulate with the displaced distal radial fragments. Soft tissue swelling is noted about the wrist. The carpal rows appear grossly intact, and otherwise demonstrate normal alignment. IMPRESSION: Comminuted and impacted fracture of the distal radial metaphysis, with marked dorsal displacement, shortening and dorsal angulation. Displaced ulnar styloid fracture also noted. Electronically Signed   By: Garald Balding M.D.   On: 08/31/2018 01:12    Procedures Procedures (including critical care time)  Medications Ordered in ED Medications - No data to display   Initial Impression / Assessment and Plan / ED Course  I have reviewed the triage vital signs and the nursing notes.  Pertinent labs & imaging results that were available during my care of the patient were reviewed by me and considered in my medical decision making (see chart for details).     Patient's sodium of 128 is higher than before.  She has no symptoms of hyponatremia.  As for her splint, which is the main reason she is here, the tingling in her fingers improved when I had her elevate her hand.  I think a lot of her symptoms are from swelling and keeping her hand lower than her heart and the rest of her arm.  Discussed the importance of elevating her  extremity and icing the hand.  Splint was removed and then reapplied by ortho tech.  Discharged home with return precautions.  Final Clinical Impressions(s) / ED Diagnoses   Final diagnoses:  Cast discomfort  Hyponatremia    ED Discharge Orders    None       Sherwood Gambler, MD 09/01/18 1538

## 2018-09-01 NOTE — ED Notes (Signed)
Elevated pt's right arm.

## 2018-09-01 NOTE — Progress Notes (Signed)
Orthopedic Tech Progress Note Patient Details:  Sheryl Lester 04/10/1956 [id number]  Ortho Devices Type of Ortho Device: Ace wrap, Sugartong splint, Arm sling Ortho Device/Splint Location: right arm Ortho Device/Splint Interventions: Application   Post Interventions Patient Tolerated: Well Instructions Provided: Care of device, Adjustment of device   Sheryl Lester [name] 09/01/2018, 1:59 PM

## 2018-09-01 NOTE — Telephone Encounter (Signed)
Copied from [place] [id number]. Topic: Quick Communication - See Telephone Encounter >> Sep 01, 2018 12:07 PM Rutherford Nail, NT wrote: CRM for notification. See Telephone encounter for: 09/01/18. Patient calling to let Dr Sharlet Salina know that she fell and broke her wrist this weekend. Went to have it rewrapped at the ER this morning because it was too tight and had them draw a sodium level since she could not make it to the office to do so. Patient states her sodium level was 128, 5 points up from what it was last time. Denies having symptoms. States that she would continue to restrict her fluid intake and will schedule an echocardiogram once she gets home from the ER.

## 2018-09-01 NOTE — Telephone Encounter (Signed)
fyi

## 2018-09-01 NOTE — Telephone Encounter (Signed)
LVM fo patient to call back for MD response

## 2018-09-01 NOTE — ED Notes (Signed)
Paged ortho tech per [employer]

## 2018-09-01 NOTE — Discharge Instructions (Addendum)
Your sodium is 128 today.  Follow-up with your primary care physician for further evaluation and trending.  Otherwise follow-up with the orthopedist you were scheduled to follow-up with after your arm injury.

## 2018-09-01 NOTE — ED Triage Notes (Addendum)
Pt here with c/o abnormal labs per pt: hyponatremia and elevated BNP.  Pt notes recent hospital visit for fall with right arm pain and at that time she was made aware of labs.  Pt also feels her arm splint is too tight.  Pt appears anxious on arrival. Pt in NAD.

## 2018-09-01 NOTE — Telephone Encounter (Signed)
Sounds appropriate to continue fluid restriction and get [place] scheduled for future.

## 2018-09-02 ENCOUNTER — Telehealth: Payer: Self-pay | Admitting: Pulmonary Disease

## 2018-09-02 ENCOUNTER — Ambulatory Visit: Payer: Self-pay

## 2018-09-02 NOTE — Telephone Encounter (Signed)
Patient informed of MD response and stated understanding  

## 2018-09-02 NOTE — Progress Notes (Deleted)
[name] [address], [place] [address] Phone: [phone] Subjective:    I'm seeing this patient by the request  of:    CC:   [id number]  Sheryl Lester is a 63 y.o. female coming in with complaint of ***  Onset-  Location Duration-  Character- Aggravating factors- Reliving factors-  Therapies tried-  Severity-     Past Medical History:  Diagnosis Date  . ALLERGIC RHINITIS 01/20/2008  . ANEMIA-IRON DEFICIENCY 01/20/2008  . ANXIETY 01/20/2008  . ASTHMATIC BRONCHITIS, ACUTE 05/19/2008  . Bipolar disorder ([place])   . CENTRAL SLEEP APNEA CONDS CLASSIFIED ELSEWHERE 08/31/2010  . Complication of anesthesia 1987   "w/one of the anesthesia agents that they don't use anymore; chest muscles constricted; felt like I couldn't breath"  . DEPRESSION 01/20/2008  . DYSPHAGIA UNSPECIFIED 12/06/2008  . ELEVATED BLOOD PRESSURE WITHOUT DIAGNOSIS OF HYPERTENSION 12/06/2008  . GERD   . History of sleep walking   . HYPERLIPIDEMIA 01/20/2008  . HYPERTENSION 12/16/2008  . Melanoma ([place]) 2012   "skin of my stomach"  . Migraine    "related to menses; went away after hysterectomy"  . OSA treated with BiPAP   . OTITIS MEDIA, ACUTE, BILATERAL 12/16/2008  . OTITIS MEDIA, SEROUS, CHRONIC 12/30/2008  . PVD 05/12/2010  . Pyelonephritis "hospitalized in ~ 1991"  . SINUSITIS- ACUTE-NOS 09/21/2008  . SWELLING MASS OR LUMP IN HEAD AND NECK 09/21/2008  . THYROID NODULE, LEFT    "unable to aspirate; has gotten very small"   . Trigeminal neuralgia 01/20/2008  . Unspecified hearing loss 09/21/2008  . VENOUS INSUFFICIENCY, LEGS 05/03/2010   Past Surgical History:  Procedure Laterality Date  . ABDOMINAL HYSTERECTOMY  2000  . APPENDECTOMY  2000  . BRAIN SURGERY    . BREAST BIOPSY Left 1994   Benign  . 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 CHOLECYSTECTOMY N/A 12/13/2015   Procedure: LAPAROSCOPIC CHOLECYSTECTOMY;  Surgeon: Mickeal Skinner, MD;  Location: [facility];  Service: General;  Laterality: N/A;  . INGUINAL HERNIA REPAIR Right 1987  . MELANOMA EXCISION  2015   "skin of my stomach"  . OOPHORECTOMY Bilateral 2000   Social History   Socioeconomic History  . Marital status: Married    Spouse name: Not on file  . Number of children: 0  . Years of education: Not on file  . Highest education level: Not on file  Occupational History  . Occupation: disabled    Fish farm manager: UNEMPLOYED  Social Needs  . Financial resource strain: Not on file  . Food insecurity:    Worry: Not on file    Inability: Not on file  . Transportation needs:    Medical: Not on file    Non-medical: Not on file  Tobacco Use  . Smoking status: Never Smoker  . Smokeless tobacco: Never Used  Substance and Sexual Activity  . Alcohol use: No  . Drug use: No  . Sexual activity: Not Currently    Partners: Male    Birth control/protection: None  Lifestyle  . Physical activity:    Days per week: Not on file    Minutes per session: Not on file  . Stress: Not on file  Relationships  . Social connections:    Talks on phone: Not on file    Gets together: Not on file    Attends religious service: Not on file    Active member of club or  organization: Not on file    Attends meetings of clubs or organizations: Not on file    Relationship status: Not on file  Other Topics Concern  . Not on file  Social History Narrative  . Not on file   Allergies  Allergen Reactions  . Aripiprazole Rash and Swelling  . Fentanyl     Other reaction(s): Other Sleep Walking  . Avapro [Irbesartan] Other (See Comments)    hyponatremia  . Sulfamethoxazole-Trimethoprim Nausea And Vomiting  . Bupropion Rash  . Ibuprofen Nausea And Vomiting   Family History  Problem Relation Age of Onset  . Hyperlipidemia Mother   . Hypertension Mother   . Cancer - Cervical Mother   . Diabetes Father   . Melanoma Father   . Anxiety disorder Father   . Depression  Father   . Melanoma Sister   . Anxiety disorder Sister   . Depression Sister   . Heart disease Paternal Grandmother   . Anxiety disorder Brother   . Depression Brother   . Breast cancer Paternal Aunt   . Heart disease Other        Mother side of family-several uncles   . Anxiety disorder Sister   . Depression Sister   . Colon cancer Neg Hx   . Stomach cancer Neg Hx     Current Outpatient Medications (Endocrine & Metabolic):  .  levothyroxine (SYNTHROID, LEVOTHROID) 50 MCG tablet, Take 1 tablet (50 mcg total) by mouth daily.  Current Outpatient Medications (Cardiovascular):  .  metoprolol tartrate (LOPRESSOR) 50 MG tablet, Take 1 tablet (50 mg total) by mouth 2 (two) times daily.   Current Outpatient Medications (Analgesics):  Marland Kitchen  Buprenorphine (BUTRANS) 20 MCG/HR PTWK, Place 20 mcg onto the skin every Tuesday.  Marland Kitchen  BUTRANS 10 MCG/HR PTWK patch, Take 10 mcg by mouth daily as needed (for pain).  Marland Kitchen  ibuprofen (ADVIL,MOTRIN) 200 MG tablet, Take 400 mg by mouth every 6 (six) hours as needed (arm pain). .  NUCYNTA 100 MG TABS, Take 100 mg by mouth as needed (facial pain).    Current Outpatient Medications (Other):  .  busPIRone (BUSPAR) 30 MG tablet, Take 30 mg by mouth 2 (two) times daily.  .  Diclofenac Sodium 2 % SOLN, Place 2 g onto the skin 2 (two) times daily. (Patient not taking: Reported on 09/01/2018) .  EQUETRO 300 MG CP12, Take 600 mg by mouth 2 (two) times daily.  .  mirtazapine (REMERON) 30 MG tablet, Take 30 mg by mouth at bedtime.  .  naloxone (NARCAN) nasal spray 4 mg/0.1 mL, Place 1 spray into the nose as needed (for overdose).  .  Vilazodone HCl (VIIBRYD PO), Take 40 mg by mouth every evening.  .  Vitamin D, Ergocalciferol, (DRISDOL) 1.25 MG (50000 UT) CAPS capsule, Take 1 capsule (50,000 Units total) by mouth every 7 (seven) days. (Patient taking differently: Take 50,000 Units by mouth every Wednesday. )    Past medical history, social, surgical and family history  all reviewed in electronic medical record.  No pertanent information unless stated regarding to the chief complaint.   Review of Systems:  No headache, visual changes, nausea, vomiting, diarrhea, constipation, dizziness, abdominal pain, skin rash, fevers, chills, night sweats, weight loss, swollen lymph nodes, body aches, joint swelling, muscle aches, chest pain, shortness of breath, mood changes.   Objective  There were no vitals taken for this visit. Systems examined below as of    General: No apparent distress alert and oriented  x3 mood and affect normal, dressed appropriately.  HEENT: Pupils equal, extraocular movements intact  Respiratory: Patient's speak in full sentences and does not appear short of breath  Cardiovascular: No lower extremity edema, non tender, no erythema  Skin: Warm dry intact with no signs of infection or rash on extremities or on axial skeleton.  Abdomen: Soft nontender  Neuro: Cranial nerves II through XII are intact, neurovascularly intact in all extremities with 2+ DTRs and 2+ pulses.  Lymph: No lymphadenopathy of posterior or anterior cervical chain or axillae bilaterally.  Gait normal with good balance and coordination.  MSK:  Non tender with full range of motion and good stability and symmetric strength and tone of shoulders, elbows, wrist, hip, knee and ankles bilaterally.     Impression and Recommendations:     This case required medical decision making of moderate complexity. The above documentation has been reviewed and is accurate and complete Lyndal Pulley, DO       Note: This dictation was prepared with Dragon dictation along with smaller phrase technology. Any transcriptional errors that result from this process are unintentional.

## 2018-09-02 NOTE — Telephone Encounter (Signed)
Called and spoke with patient, she stated that she was supposed to have her sleep study done on the 23rd but she cancelled it on the system. She would like to reschedule this. [facility] please advise thank you.

## 2018-09-02 NOTE — Telephone Encounter (Signed)
Copied from [place] [id number]. Topic: Referral - Request for Referral >> Sep 02, 2018 10:44 AM Alanda Slim E wrote: Has patient seen PCP for this complaint? Yes   Referral for which specialty: Nephrology Preferred provider/office:  Reason for referral: Dr. Sharlet Salina put in the referral for the Echocardiogram and Pt made the appt for Friday 01.10.2020 but the order for the ultrasound was not put in. / please call Pt when order is put in so she can make appt or change appt she has already made for this week to add the ultra sound.

## 2018-09-02 NOTE — Telephone Encounter (Signed)
The patient will need to call [phone] to reschedule this appointment.

## 2018-09-02 NOTE — Telephone Encounter (Signed)
An echocardiogram is an ultrasound of the heart and is what we talked about. There are no further orders needed.

## 2018-09-02 NOTE — Telephone Encounter (Signed)
Called and left Patient message to call [place] at [phone], to reschedule appointment.

## 2018-09-02 NOTE — Telephone Encounter (Signed)
Telephone call from Patient Patient voiced understanding of Dr.  Charlynne Cousins message .  States she will make appointment for ultra sound.

## 2018-09-03 ENCOUNTER — Ambulatory Visit (HOSPITAL_BASED_OUTPATIENT_CLINIC_OR_DEPARTMENT_OTHER): Payer: PRIVATE HEALTH INSURANCE | Attending: Pulmonary Disease | Admitting: Pulmonary Disease

## 2018-09-03 ENCOUNTER — Ambulatory Visit: Payer: Self-pay | Admitting: Family Medicine

## 2018-09-03 VITALS — Ht 67.0 in | Wt 154.0 lb

## 2018-09-03 DIAGNOSIS — G4733 Obstructive sleep apnea (adult) (pediatric): Secondary | ICD-10-CM | POA: Insufficient documentation

## 2018-09-03 NOTE — Telephone Encounter (Signed)
Spoke with patient-has phone number to contact [place] and will call then now to [facility] sleep Study

## 2018-09-05 ENCOUNTER — Other Ambulatory Visit: Payer: Self-pay

## 2018-09-05 ENCOUNTER — Ambulatory Visit (HOSPITAL_COMMUNITY): Payer: PRIVATE HEALTH INSURANCE | Attending: Cardiovascular Disease

## 2018-09-05 DIAGNOSIS — R0600 Dyspnea, unspecified: Secondary | ICD-10-CM | POA: Diagnosis not present

## 2018-09-08 DIAGNOSIS — S52509A Unspecified fracture of the lower end of unspecified radius, initial encounter for closed fracture: Secondary | ICD-10-CM | POA: Insufficient documentation

## 2018-09-09 ENCOUNTER — Telehealth: Payer: Self-pay | Admitting: Pulmonary Disease

## 2018-09-09 DIAGNOSIS — G4733 Obstructive sleep apnea (adult) (pediatric): Secondary | ICD-10-CM | POA: Diagnosis not present

## 2018-09-09 DIAGNOSIS — F1190 Opioid use, unspecified, uncomplicated: Principal | ICD-10-CM

## 2018-09-09 DIAGNOSIS — G4737 Central sleep apnea in conditions classified elsewhere: Secondary | ICD-10-CM

## 2018-09-09 NOTE — Telephone Encounter (Signed)
Sleep study showed severe central apneas due tot he narcotics she is on. She needs to get back on BiPAP 11/7 - she has a machine already  Schedule OV with me in 3-4 wks with downbload

## 2018-09-09 NOTE — Procedures (Addendum)
Patient Name: Sheryl Lester, Sheryl Lester Date: 09/03/2018 Gender: Female D.O.B: [birth year] Age (years): 42 Referring Provider: Kara Mead MD, ABSM Height (inches): 67 Interpreting Physician: Kara Mead MD, ABSM Weight (lbs): 154 RPSGT: Laren Everts BMI: 24 MRN: [id number] Neck Size: 14.00 <br> <br> CLINICAL INFORMATION The patient is referred for a split night study with BPAP. MEDICATIONS Medications self-administered by patient taken the night of the study : TART CHERRY EXTRACT, EQUETRO, METOPROLOL, MIRTAZAPINE, VIIBRYD, BUTRANS  SLEEP STUDY TECHNIQUE As per the AASM Manual for the Scoring of Sleep and Associated Events v2.3 (April 2016) with a hypopnea requiring 4% desaturations.  The channels recorded and monitored were frontal, central and occipital EEG, electrooculogram (EOG), submentalis EMG (chin), nasal and oral airflow, thoracic and abdominal wall motion, anterior tibialis EMG, snore microphone, electrocardiogram, and pulse oximetry. Bi-level positive airway pressure (BiPAP) was initiated when the patient met split night criteria and was titrated according to treat sleep-disordered breathing.  RESPIRATORY PARAMETERS Diagnostic  Total AHI (/hr): 63.3 RDI (/hr): 63.3 OA Index (/hr): 2.2 CA Index (/hr): 54.4 REM AHI (/hr): N/A NREM AHI (/hr): 63.3 Supine AHI (/hr): 63.6 Non-supine AHI (/hr): 0.00 Min O2 Sat (%): 81.0 Mean O2 (%): 93.6 Time below 88% (min): 1.6   Titration  Optimal IPAP Pressure (cm): 11 Optimal EPAP Pressure (cm): 7 AHI at Optimal Pressure (/hr): 21.1 Min O2 at Optimal Pressure (%): 84.0 Sleep % at Optimal (%): 46 Supine % at Optimal (%): 89     SLEEP ARCHITECTURE The study was initiated at 10:48:07 PM and terminated at 4:53:08 AM. The total recorded time was 365 minutes. EEG confirmed total sleep time was 280 minutes yielding a sleep efficiency of 76.7%%. Sleep onset after lights out was 9.4 minutes with a REM latency of 271.0 minutes. The patient spent  4.1%% of the night in stage N1 sleep, 93.9%% in stage N2 sleep, 0.0%% in stage N3 and 2% in REM. Wake after sleep onset (WASO) was 75.7 minutes. The Arousal Index was 10.5/hour.  LEG MOVEMENT DATA The total Periodic Limb Movements of Sleep (PLMS) were 0. The PLMS index was 0.0 .  CARDIAC DATA The 2 lead EKG demonstrated sinus rhythm. The mean heart rate was 100.0 beats per minute. Other EKG findings include: None.  IMPRESSIONS - Severe central sleep apnea occurred during the diagnostic portion of the study (AHI = 63.3 /hour). An optimal PAP pressure was selected for this patient ( 11 / 7 cm of water) - Severe central sleep apnea occurred during the diagnostic portion of the study (CAI = 54.4/hour). - Moderate oxygen desaturation was noted during the diagnostic portion of the study (Min O2 = 81.0%). - The patient snored with soft snoring volume during the diagnostic portion of the study. - No cardiac abnormalities were noted during this study. - Clinically significant periodic limb movements of sleep did not occur during the study.   DIAGNOSIS -Central Sleep Apnea (327.23 [G47.33 ICD-10])   RECOMMENDATIONS - Trial of BiPAP therapy on 11/7 cm H2O with a Small size Resmed Full Face Mask AirFit 20 for Her mask and heated humidification. - Decrease narcotics if able - Avoid alcohol, sedatives and other CNS depressants that may worsen sleep apnea and disrupt normal sleep architecture. - Sleep hygiene should be reviewed to assess factors that may improve sleep quality. - Weight management and regular exercise should be initiated or continued. - Return to [facility] Center for re-evaluation.   Kara Mead MD Board Certified in [place]

## 2018-09-11 ENCOUNTER — Telehealth: Payer: Self-pay | Admitting: Internal Medicine

## 2018-09-11 ENCOUNTER — Telehealth: Payer: Self-pay

## 2018-09-11 NOTE — Telephone Encounter (Signed)
Called pt to relay results/recs.   While we were on the phone the line disconnected. atc pt X2, line rang to fast busy signal.  wcb.

## 2018-09-11 NOTE — Telephone Encounter (Signed)
Copied from [place] [phone]. Topic: General - Inquiry >> Sep 11, 2018 11:51 AM Conception Chancy, NT wrote: Reason for CRM: patient states she had a echo done last week and would like to know the results.

## 2018-09-11 NOTE — Telephone Encounter (Signed)
Pt called back, aware of results/recs.  Order placed to [facility] to ensure correct pressure on bipap.  Pt was unable to schedule an OV at this time.  Will leave message open to ensure that rov is scheduled with pt.

## 2018-09-11 NOTE — Telephone Encounter (Signed)
LVM for patient to call back for echo results

## 2018-09-11 NOTE — Telephone Encounter (Signed)
Charted in a separate telephone encounter.

## 2018-09-11 NOTE — Telephone Encounter (Signed)
Pt. Given echocardiogram results - verbalizes understanding.

## 2018-09-11 NOTE — Telephone Encounter (Signed)
Charted in error.

## 2018-09-12 NOTE — Telephone Encounter (Signed)
Reminder placed for me to contact the patient in a month to get her scheduled for a follow up.

## 2018-09-16 ENCOUNTER — Telehealth: Payer: Self-pay | Admitting: Internal Medicine

## 2018-09-16 NOTE — Telephone Encounter (Signed)
Patient informed that the labs are already placed and she can come at any point to get them checked and if she wants to make an appointment after the labs come back

## 2018-09-16 NOTE — Telephone Encounter (Signed)
Patient is requesting call back in regards to getting a lab done to check her sodium.  Patient would like to discuss with [name].

## 2018-09-18 ENCOUNTER — Other Ambulatory Visit (INDEPENDENT_AMBULATORY_CARE_PROVIDER_SITE_OTHER): Payer: PRIVATE HEALTH INSURANCE

## 2018-09-18 ENCOUNTER — Encounter: Payer: Self-pay | Admitting: Family Medicine

## 2018-09-18 ENCOUNTER — Ambulatory Visit: Payer: PRIVATE HEALTH INSURANCE | Admitting: Family Medicine

## 2018-09-18 DIAGNOSIS — E871 Hypo-osmolality and hyponatremia: Secondary | ICD-10-CM | POA: Diagnosis not present

## 2018-09-18 DIAGNOSIS — M19071 Primary osteoarthritis, right ankle and foot: Secondary | ICD-10-CM | POA: Diagnosis not present

## 2018-09-18 LAB — BASIC METABOLIC PANEL
BUN: 18 mg/dL (ref 6–23)
CHLORIDE: 99 meq/L (ref 96–112)
CO2: 25 mEq/L (ref 19–32)
Calcium: 8.6 mg/dL (ref 8.4–10.5)
Creatinine, Ser: 0.65 mg/dL (ref 0.40–1.20)
GFR: 92.77 mL/min (ref >60.00)
Glucose, Bld: 97 mg/dL (ref 70–99)
POTASSIUM: 4.6 meq/L (ref 3.5–5.1)
Sodium: 130 mEq/L — ABNORMAL LOW (ref 135–145)

## 2018-09-18 NOTE — Assessment & Plan Note (Addendum)
Right ankle arthritis.  Significant overall.  Did have an allergic reaction to the neoprene brace.  We discussed with patient about use of a lotion.  We discussed the possibility of custom bracing which patient declined.  Patient's told about over-the-counter shoes.  Continue the home exercises, icing regimen, follow-up with me again in 6 weeks.  Spent  25 minutes with patient face-to-face and had greater than 50% of counseling including as described above in assessment and plan.

## 2018-09-18 NOTE — Progress Notes (Signed)
[facility] [place], [place] [address] Phone: [phone] Subjective:   Sheryl Sheryl Lester, am serving as a scribe for Dr. Hulan Saas.    CC: Right ankle pain follow-up  BJY:[id number]  Sheryl Sheryl Lester is a 63 y.o. female coming in with complaint of ankle pain. She said that she has been wearing the ankle sleeve. Did tie her shoes too tight one day and had indention in the skin. Patient does feel better than last visit.  Making some improvement.  But still has some instability on a regular basis.    Past Medical History:  Diagnosis Date  . ALLERGIC RHINITIS 01/20/2008  . ANEMIA-IRON DEFICIENCY 01/20/2008  . ANXIETY 01/20/2008  . ASTHMATIC BRONCHITIS, ACUTE 05/19/2008  . Bipolar disorder ([place])   . CENTRAL SLEEP APNEA CONDS CLASSIFIED ELSEWHERE 08/31/2010  . Complication of anesthesia 1987   "w/one of the anesthesia agents that they don't use anymore; chest muscles constricted; felt like I couldn't breath"  . DEPRESSION 01/20/2008  . DYSPHAGIA UNSPECIFIED 12/06/2008  . ELEVATED BLOOD PRESSURE WITHOUT DIAGNOSIS OF HYPERTENSION 12/06/2008  . GERD   . History of sleep walking   . HYPERLIPIDEMIA 01/20/2008  . HYPERTENSION 12/16/2008  . Melanoma ([place]) 2012   "skin of my stomach"  . Migraine    "related to menses; went away after hysterectomy"  . OSA treated with BiPAP   . OTITIS MEDIA, ACUTE, BILATERAL 12/16/2008  . OTITIS MEDIA, SEROUS, CHRONIC 12/30/2008  . PVD 05/12/2010  . Pyelonephritis "hospitalized in ~ 1991"  . SINUSITIS- ACUTE-NOS 09/21/2008  . SWELLING MASS OR LUMP IN HEAD AND NECK 09/21/2008  . THYROID NODULE, LEFT    "unable to aspirate; has gotten very small"   . Trigeminal neuralgia 01/20/2008  . Unspecified hearing loss 09/21/2008  . VENOUS INSUFFICIENCY, LEGS 05/03/2010   Past Surgical History:  Procedure Laterality Date  . ABDOMINAL HYSTERECTOMY  2000  . APPENDECTOMY  2000  . BRAIN SURGERY    . BREAST BIOPSY Left 1994   Benign    . 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 CHOLECYSTECTOMY N/A 12/13/2015   Procedure: LAPAROSCOPIC CHOLECYSTECTOMY;  Surgeon: Mickeal Skinner, MD;  Location: [facility];  Service: General;  Laterality: N/A;  . INGUINAL HERNIA REPAIR Right 1987  . MELANOMA EXCISION  2015   "skin of my stomach"  . OOPHORECTOMY Bilateral 2000   Social History   Socioeconomic History  . Marital status: Married    Spouse name: Not on file  . Number of children: 0  . Years of education: Not on file  . Highest education level: Not on file  Occupational History  . Occupation: disabled    Fish farm manager: UNEMPLOYED  Social Needs  . Financial resource strain: Not on file  . Food insecurity:    Worry: Not on file    Inability: Not on file  . Transportation needs:    Medical: Not on file    Non-medical: Not on file  Tobacco Use  . Smoking status: Never Smoker  . Smokeless tobacco: Never Used  Substance and Sexual Activity  . Alcohol use: Sheryl Lester  . Drug use: Sheryl Lester  . Sexual activity: Not Currently    Partners: Male    Birth control/protection: None  Lifestyle  . Physical activity:    Days per week: Not on file    Minutes per session: Not on file  . Stress: Not on file  Relationships  . Social  connections:    Talks on phone: Not on file    Gets together: Not on file    Attends religious service: Not on file    Active member of club or organization: Not on file    Attends meetings of clubs or organizations: Not on file    Relationship status: Not on file  Other Topics Concern  . Not on file  Social History Narrative  . Not on file   Allergies  Allergen Reactions  . Aripiprazole Rash and Swelling  . Fentanyl     Other reaction(s): Other Sleep Walking  . Avapro [Irbesartan] Other (See Comments)    hyponatremia  . Sulfamethoxazole-Trimethoprim Nausea And Vomiting  . Bupropion Rash  . Ibuprofen Nausea And Vomiting   Family History  Problem Relation Age of  Onset  . Hyperlipidemia Mother   . Hypertension Mother   . Cancer - Cervical Mother   . Diabetes Father   . Melanoma Father   . Anxiety disorder Father   . Depression Father   . Melanoma Sister   . Anxiety disorder Sister   . Depression Sister   . Heart disease Paternal Grandmother   . Anxiety disorder Brother   . Depression Brother   . Breast cancer Paternal Aunt   . Heart disease Other        Mother side of family-several uncles   . Anxiety disorder Sister   . Depression Sister   . Colon cancer Neg Hx   . Stomach cancer Neg Hx     Current Outpatient Medications (Endocrine & Metabolic):  .  levothyroxine (SYNTHROID, LEVOTHROID) 50 MCG tablet, Take 1 tablet (50 mcg total) by mouth daily.  Current Outpatient Medications (Cardiovascular):  .  metoprolol tartrate (LOPRESSOR) 50 MG tablet, Take 1 tablet (50 mg total) by mouth 2 (two) times daily.   Current Outpatient Medications (Analgesics):  Marland Kitchen  Buprenorphine (BUTRANS) 20 MCG/HR PTWK, Place 20 mcg onto the skin every Tuesday.  Marland Kitchen  BUTRANS 10 MCG/HR PTWK patch, Take 10 mcg by mouth daily as needed (for pain).  Marland Kitchen  ibuprofen (ADVIL,MOTRIN) 200 MG tablet, Take 400 mg by mouth every 6 (six) hours as needed (arm pain). .  NUCYNTA 100 MG TABS, Take 100 mg by mouth as needed (facial pain).    Current Outpatient Medications (Other):  .  busPIRone (BUSPAR) 30 MG tablet, Take 30 mg by mouth 2 (two) times daily.  .  Diclofenac Sodium 2 % SOLN, Place 2 g onto the skin 2 (two) times daily. Marland Kitchen  EQUETRO 300 MG CP12, Take 600 mg by mouth 2 (two) times daily.  .  mirtazapine (REMERON) 30 MG tablet, Take 30 mg by mouth at bedtime.  .  naloxone (NARCAN) nasal spray 4 mg/0.1 mL, Place 1 spray into the nose as needed (for overdose).  .  Vilazodone HCl (VIIBRYD PO), Take 40 mg by mouth every evening.  .  Vitamin D, Ergocalciferol, (DRISDOL) 1.25 MG (50000 UT) CAPS capsule, Take 1 capsule (50,000 Units total) by mouth every 7 (seven) days. (Patient  taking differently: Take 50,000 Units by mouth every Wednesday. )    Past medical history, social, surgical and family history all reviewed in electronic medical record.  Sheryl Lester pertanent information unless stated regarding to the chief complaint.   Review of Systems:  Sheryl Lester headache, visual changes, nausea, vomiting, diarrhea, constipation, dizziness, abdominal pain, skin rash, fevers, chills, night sweats, weight loss, swollen lymph nodes, body aches, joint swelling, mchest pain, shortness of breath, mood changes.  Positive muscle aches  Objective  Blood pressure 138/80, pulse (!) 59, height 5\' 7"  (1.702 m), SpO2 98 %.    General: Sheryl Lester apparent distress alert and oriented x3 mood and affect normal, dressed appropriately.  HEENT: Pupils equal, extraocular movements intact  Respiratory: Patient's speak in full sentences and does not appear short of breath  Cardiovascular: Sheryl Lester lower extremity edema, non tender, Sheryl Lester erythema  Skin: Skin on patient's right ankle does have a significant erythema and rash noted with the same linear relationship to the compression sleeve Abdomen: Soft nontender  Neuro: Cranial nerves II through XII are intact, neurovascularly intact in all extremities with 2+ DTRs and 2+ pulses.  Lymph: Sheryl Lester lymphadenopathy of posterior or anterior cervical chain or axillae bilaterally.  Gait mild antalgic MSK: Right ankle shows that patient does have post arthritic changes.  Some mild limited range of motion in all planes of the legs 5 to 10 degrees.  Positive anterior draw.  Mild crepitus noted with range of motion.    Impression and Recommendations:     This case required medical decision making of moderate complexity. The above documentation has been reviewed and is accurate and complete Lyndal Pulley, DO       Note: This dictation was prepared with Dragon dictation along with smaller phrase technology. Any transcriptional errors that result from this process are unintentional.

## 2018-09-18 NOTE — Patient Instructions (Addendum)
I am sorry you are hurting.  Ideas we have include injections, bracing, PT as well or will need to talk imaging.  Look at shoes first   Try [place], [place], SAS, finn comfort or other rocker bottom shoes.  Stay active Continue the vitamins See me again in 6 weeks

## 2018-09-22 ENCOUNTER — Telehealth: Payer: Self-pay | Admitting: Internal Medicine

## 2018-09-22 NOTE — Telephone Encounter (Signed)
Routing to dr Ronnald Ramp, sodium level is 130---please advise in the absence of dr Sharlet Salina, I will call patient back, thanks

## 2018-09-22 NOTE — Telephone Encounter (Signed)
She has had a low sodium for 4 months. Over the last 3 weeks her sodium has actually improved from 123 up to 130. This is very reassuring that this is not an acute,critical illness.

## 2018-09-22 NOTE — Telephone Encounter (Signed)
Sodium is slightly better at 130

## 2018-09-22 NOTE — Telephone Encounter (Signed)
1/27 3:39pm Sheryl Lester Pt called stating she needs someone to review sodium level and get back to her today with results as a month ago she was hospitalized for several days (out of town) and this is critical for her to know.   Please call today [phone]  Copied from [place] [phone]. Topic: General - Other >> Sep 22, 2018  1:28 PM Yvette Rack wrote: Reason for CRM: Pt called in for lab results. Pt requests call back. Cb# [phone]

## 2018-09-22 NOTE — Telephone Encounter (Signed)
MD is out of the office until 09/25/18. pls advise on labs.Johny Chess Copied from [place] [phone]. Topic: General - Other >> Sep 22, 2018  1:28 PM Yvette Rack wrote: Reason for CRM: Pt called in for lab results. Pt requests call back. Cb# [phone]

## 2018-09-22 NOTE — Telephone Encounter (Signed)
Patient advised of dr Ronnald Ramp note---patient is still concerned about her BNP---I did not have a BNP result from 1/23, patient feels that this may need to be retested too----routing to dr crawford---patient states she will contact you on Wednesday when you return---routing to dr Sharlet Salina, fyi.Marland KitchenMarland KitchenMarland Kitchen

## 2018-09-23 NOTE — Telephone Encounter (Signed)
Called pt no answer LMOM w/MD response../lmb 

## 2018-09-24 ENCOUNTER — Other Ambulatory Visit: Payer: Self-pay | Admitting: Internal Medicine

## 2018-09-24 NOTE — Telephone Encounter (Signed)
Pt called in, she states that she is unable to see nephrology until 10/29/18. Pt feels that that is a long time to go without having her numbers checked. Pt says until she see's the nephrologist she would like to have her sodium checked twice a month until her apt. Pt says that it is medically necessary so she feels that her insurance should cover this. Pt is also following up on having a BNP test completed.

## 2018-09-25 NOTE — Telephone Encounter (Signed)
Same advice as before. It is unclear why my recommendation was not given to patient. I would be happy to talk to patient at visit if she has concerns that cannot be addressed over the phone.

## 2018-09-25 NOTE — Telephone Encounter (Signed)
Left message advising patient of all notes/instructions given by dr crawford---please call back to schedule office visit to discuss thoroughly with dr crawford----can talk with tamara,RN if any further questions

## 2018-09-25 NOTE — Telephone Encounter (Signed)
Pt called in again to f/u on labs. She states she is needing to have a repeat BNP as well as recheck sodium level 1-2x per week. I did not advise pt of note below. She states that she is concerned about delays in getting lab results so she wants frequent testing. Pt states appt 3/4 with nephrology. If Dr. Sharlet Salina will not order does she need to change to an urgent care or otherwise? She said the hospital put it in her head that she was in a serious situation the last time and does not want to get to that again.   Call back # [phone] - will not be home after 3pm today - ok to leave detailed message

## 2018-09-25 NOTE — Telephone Encounter (Signed)
I would not recommend any more often than monthly unless she is feeling sick in which case she can come to office. BNP is not appropriate to recheck at this time.

## 2018-09-26 ENCOUNTER — Encounter: Payer: Self-pay | Admitting: Internal Medicine

## 2018-09-26 ENCOUNTER — Ambulatory Visit: Payer: PRIVATE HEALTH INSURANCE | Admitting: Internal Medicine

## 2018-09-26 ENCOUNTER — Encounter (HOSPITAL_COMMUNITY): Payer: Self-pay | Admitting: Anesthesiology

## 2018-09-26 ENCOUNTER — Encounter: Payer: Self-pay | Admitting: Family Medicine

## 2018-09-26 ENCOUNTER — Other Ambulatory Visit (INDEPENDENT_AMBULATORY_CARE_PROVIDER_SITE_OTHER): Payer: PRIVATE HEALTH INSURANCE

## 2018-09-26 VITALS — BP 180/100 | HR 63 | Temp 98.5°F | Ht 67.0 in

## 2018-09-26 DIAGNOSIS — E871 Hypo-osmolality and hyponatremia: Secondary | ICD-10-CM

## 2018-09-26 DIAGNOSIS — R7989 Other specified abnormal findings of blood chemistry: Secondary | ICD-10-CM | POA: Diagnosis not present

## 2018-09-26 DIAGNOSIS — E538 Deficiency of other specified B group vitamins: Secondary | ICD-10-CM | POA: Diagnosis not present

## 2018-09-26 LAB — BASIC METABOLIC PANEL
BUN: 24 mg/dL — ABNORMAL HIGH (ref 6–23)
CO2: 23 mEq/L (ref 19–32)
Calcium: 8.8 mg/dL (ref 8.4–10.5)
Chloride: 97 mEq/L (ref 96–112)
Creatinine, Ser: 0.89 mg/dL (ref 0.40–1.20)
GFR: 64.55 mL/min (ref >60.00)
Glucose, Bld: 90 mg/dL (ref 70–99)
Potassium: 4.8 mEq/L (ref 3.5–5.1)
Sodium: 125 mEq/L — ABNORMAL LOW (ref 135–145)

## 2018-09-26 LAB — BRAIN NATRIURETIC PEPTIDE: Pro B Natriuretic peptide (BNP): 108 pg/mL — ABNORMAL HIGH (ref 0.0–100.0)

## 2018-09-26 NOTE — Progress Notes (Signed)
Received call from [facility] regarding pt. Pt very upset that her surgery was cancelled due to her hyponatremia. Per [facility] nurse patient is currently asymptomatic. Advised for patient to follow up with pcp on Monday for repeat labs as directed previously.   Sheryl Lester. Jerline Pain, MD

## 2018-09-26 NOTE — Assessment & Plan Note (Signed)
Given B12 injection today. °

## 2018-09-26 NOTE — Assessment & Plan Note (Signed)
We talked about how there are many causes of elevated BNP and how this is not helpful to trend. Will check today and depending on results will decide on frequency. We talked specifically about how in the absence of heart failure this is not very helpful in determining volume status.

## 2018-09-26 NOTE — Assessment & Plan Note (Signed)
Checking BMP today and agree to check again on Monday due to surgery this weekend and then set frequency from there. She is reminded about limiting liquids.

## 2018-09-26 NOTE — Progress Notes (Signed)
Working up chart for pre-op call, noticed that pt was at Dr. Nathanial Millman office getting her sodium level rechecked. It had been 130 on 09/18/18. Today's level is 125. Dr. Nathanial Millman note is not complete so unable to know what her plans are. I spoke with Dr. Marcie Bal, Anesthesiologist and gave him the report and he feels that pt needs to get her sodium up before surgery is done. I called Dr. Caralyn Guile, Orthopedic surgeon and he wanted me to ask Dr. Marcie Bal if he did a block and just sedation would he be ok with that. I asked Dr. Marcie Bal and he said no. He would prefer pt to have her sodium level up at least to 130. I called Dr. Caralyn Guile back and gave him Dr. Trude Mcburney response and he said to cancel pt's surgery for tomorrow. Notified Rodena Piety at the [facility] desk and will call pt.

## 2018-09-26 NOTE — Patient Instructions (Addendum)
We will check the labs today and again on Monday after the surgery.

## 2018-09-26 NOTE — Progress Notes (Signed)
   Subjective:   Patient ID: Sheryl Lester, female    DOB: 02-16-1956, 63 y.o.   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   Review of Systems  Constitutional: Negative.   HENT: Negative.   Eyes: Negative.   Respiratory: Negative for cough, chest tightness and shortness of breath.   Cardiovascular: Negative for chest pain, palpitations and leg swelling.  Gastrointestinal: Negative for abdominal distention, abdominal pain, constipation, diarrhea, nausea and vomiting.  Musculoskeletal: Positive for arthralgias, joint swelling and myalgias.  Skin: Negative.   Neurological: Negative.   Psychiatric/Behavioral: Negative.     Objective:  Physical Exam Constitutional:      Appearance: She is well-developed.  HENT:     Head: Normocephalic and atraumatic.  Neck:     Musculoskeletal: Normal range of motion.  Cardiovascular:     Rate and Rhythm: Normal rate and regular rhythm.  Pulmonary:     Effort: Pulmonary effort is normal. No  respiratory distress.     Breath sounds: Normal breath sounds. No wheezing or rales.  Abdominal:     General: Bowel sounds are normal. There is no distension.     Palpations: Abdomen is soft.     Tenderness: There is no abdominal tenderness. There is no rebound.  Musculoskeletal:        General: Swelling and tenderness present.     Comments: Right wrist in splint  Skin:    General: Skin is warm and dry.  Neurological:     Mental Status: She is alert and oriented to person, place, and time.     Coordination: Coordination normal.     Vitals:   09/26/18 1452  BP: (!) 180/100  Pulse: 63  Temp: 98.5 F (36.9 C)  TempSrc: Oral  SpO2: 98%  Height: 5\' 7"  (1.702 m)    Assessment & Plan:  B12 given at visit

## 2018-09-27 ENCOUNTER — Ambulatory Visit (HOSPITAL_COMMUNITY)
Admission: RE | Admit: 2018-09-27 | Payer: PRIVATE HEALTH INSURANCE | Source: Home / Self Care | Admitting: Orthopedic Surgery

## 2018-09-27 ENCOUNTER — Telehealth: Payer: Self-pay | Admitting: Internal Medicine

## 2018-09-27 SURGERY — OPEN REDUCTION INTERNAL FIXATION (ORIF) WRIST FRACTURE
Anesthesia: Choice | Laterality: Right

## 2018-09-27 NOTE — Telephone Encounter (Signed)
Pt would like a call back about why she did not get a call about her lab results, her surgery for today was canceled.  Please advise and call patient back

## 2018-09-29 ENCOUNTER — Other Ambulatory Visit (INDEPENDENT_AMBULATORY_CARE_PROVIDER_SITE_OTHER): Payer: PRIVATE HEALTH INSURANCE

## 2018-09-29 DIAGNOSIS — E871 Hypo-osmolality and hyponatremia: Secondary | ICD-10-CM | POA: Diagnosis not present

## 2018-09-29 LAB — BASIC METABOLIC PANEL
BUN: 12 mg/dL (ref 6–23)
CO2: 23 mEq/L (ref 19–32)
Calcium: 8.9 mg/dL (ref 8.4–10.5)
Chloride: 94 mEq/L — ABNORMAL LOW (ref 96–112)
Creatinine, Ser: 0.65 mg/dL (ref 0.40–1.20)
GFR: 92.76 mL/min (ref >60.00)
Glucose, Bld: 137 mg/dL — ABNORMAL HIGH (ref 70–99)
Potassium: 4.8 mEq/L (ref 3.5–5.1)
Sodium: 126 mEq/L — ABNORMAL LOW (ref 135–145)

## 2018-09-29 NOTE — Telephone Encounter (Signed)
Have faxed back sign form with lab results as requested to fax number circled on sheet

## 2018-09-29 NOTE — Telephone Encounter (Signed)
I have placed the form with another provider to sign so I can fax back asap

## 2018-09-29 NOTE — Telephone Encounter (Signed)
Called patient and she is very worried that her surgery was cancelled because of her sodium numbers. I have deactivated patient Mychart as that is where her labs were sent and she does not use. I explained to patient that I would call the orthopedics office to discuss her labs and that Dr. Sharlet Salina states that she is fine with her to continue with her surgery. I could not get in touch with anyone and was on hold for 10 minutes. I left a voicemail for someone to call back in regard to this issue to discuss.

## 2018-09-29 NOTE — Telephone Encounter (Signed)
Patient is calling to state her surgery has been canceled due to a form that has been faxed over by [facility]. She stated this form is needing to be filled out and faxed back. She stated she is in pain and needing this surgery. As soon as possible. Please advise

## 2018-09-29 NOTE — H&P (Signed)
Sheryl Lester is an 63 y.o. female.   Chief Complaint: RIGHT WRIST INJURY   HPI: The patient is a 63y/o left hand dominant female who fell on 08/30/18 causing injury to the right wrist. She was seen in the emergency department and underwent a successful closed reduction of the distal radius.  She was seen in our office by Dr Stann Mainland. The patient complained of continued pain, weakness, swelling, and stiffness in the arm. Radiographs revealed the fracture has displaced despite her being in a short arm cast.  The patient is here today for surgery.  She denies chest pain, shortness of breath, fever, chills, nausea, vomiting, or diarrhea.     Past Medical History:  Diagnosis Date  . ALLERGIC RHINITIS 01/20/2008  . ANEMIA-IRON DEFICIENCY 01/20/2008  . ANXIETY 01/20/2008  . ASTHMATIC BRONCHITIS, ACUTE 05/19/2008  . Bipolar disorder (Sheryl Lester)   . CENTRAL SLEEP APNEA CONDS CLASSIFIED ELSEWHERE 08/31/2010  . Complication of anesthesia 1987   "w/one of the anesthesia agents that they don't use anymore; chest muscles constricted; felt like I couldn't breath"  . DEPRESSION 01/20/2008  . DYSPHAGIA UNSPECIFIED 12/06/2008  . ELEVATED BLOOD PRESSURE WITHOUT DIAGNOSIS OF HYPERTENSION 12/06/2008  . GERD   . History of sleep walking   . HYPERLIPIDEMIA 01/20/2008  . HYPERTENSION 12/16/2008  . Melanoma ([place]) 2012   "skin of my stomach"  . Migraine    "related to menses; went away after hysterectomy"  . OSA treated with BiPAP   . OTITIS MEDIA, ACUTE, BILATERAL 12/16/2008  . OTITIS MEDIA, SEROUS, CHRONIC 12/30/2008  . PVD 05/12/2010  . Pyelonephritis "hospitalized in ~ 1991"  . SINUSITIS- ACUTE-NOS 09/21/2008  . SWELLING MASS OR LUMP IN HEAD AND NECK 09/21/2008  . THYROID NODULE, LEFT    "unable to aspirate; has gotten very small"   . Trigeminal neuralgia 01/20/2008  . Unspecified hearing loss 09/21/2008  . VENOUS INSUFFICIENCY, LEGS 05/03/2010    Past Surgical History:  Procedure Laterality Date  . ABDOMINAL  HYSTERECTOMY  2000  . APPENDECTOMY  2000  . BRAIN SURGERY    . BREAST BIOPSY Left 1994   Benign  . 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 CHOLECYSTECTOMY N/A 12/13/2015   Procedure: LAPAROSCOPIC CHOLECYSTECTOMY;  Surgeon: Mickeal Skinner, MD;  Location: [facility];  Service: General;  Laterality: N/A;  . INGUINAL HERNIA REPAIR Right 1987  . MELANOMA EXCISION  2015   "skin of my stomach"  . OOPHORECTOMY Bilateral 2000    Family History  Problem Relation Age of Onset  . Hyperlipidemia Mother   . Hypertension Mother   . Cancer - Cervical Mother   . Diabetes Father   . Melanoma Father   . Anxiety disorder Father   . Depression Father   . Melanoma Sister   . Anxiety disorder Sister   . Depression Sister   . Heart disease Paternal Grandmother   . Anxiety disorder Brother   . Depression Brother   . Breast cancer Paternal Aunt   . Heart disease Other        Mother side of family-several uncles   . Anxiety disorder Sister   . Depression Sister   . Colon cancer Neg Hx   . Stomach cancer Neg Hx    Social History:  reports that she has never smoked. She has never used smokeless tobacco. She reports that she does not drink alcohol or use drugs.  Allergies:  Allergies  Allergen Reactions  . Aripiprazole Swelling  and Rash    *Abilify   . Fentanyl     Other reaction(s): Other Sleep Walking  . Avapro [Irbesartan] Other (See Comments)    hyponatremia  . Sulfamethoxazole-Trimethoprim Nausea And Vomiting  . Bupropion Rash    Wellbutrin   . Ibuprofen Nausea And Vomiting    No medications prior to admission.    Results for orders placed or performed in visit on 09/29/18 (from the past 48 hour(s))  Basic Metabolic Panel (BMET)     Status: Abnormal   Collection Time: 09/29/18  9:33 AM  Result Value Ref Range   Sodium 126 (L) 135 - 145 mEq/L   Potassium 4.8 3.5 - 5.1 mEq/L   Chloride 94 (L) 96 - 112 mEq/L   CO2 23 19 - 32 mEq/L    Glucose, Bld 137 (H) 70 - 99 mg/dL   BUN 12 6 - 23 mg/dL   Creatinine, Ser 0.65 0.40 - 1.20 mg/dL   Calcium 8.9 8.4 - 10.5 mg/dL   GFR 92.76 >60.00 mL/min   No results found.  ROS NO RECENT ILLNESSES OR HOSPITALIZATIONS   There were no vitals taken for this visit. Physical Exam  General Appearance:  Alert, cooperative, no distress, appears stated age  Head:  Normocephalic, without obvious abnormality, atraumatic  Eyes:  Pupils equal, conjunctiva/corneas clear,         Throat: Lips, mucosa, and tongue normal; teeth and gums normal  Neck: No visible masses     Lungs:   respirations unlabored  Chest Wall:  No tenderness or deformity  Heart:  Regular rate and rhythm,  Abdomen:   Soft, non-tender,         Extremities:  The patient skin is in good condition.  She is able to extend her thumb and extend her digits her fingertips are warm well perfused good capillary refill  Pulses: 2+ and symmetric  Skin: Skin color, texture, turgor normal, no rashes or lesions     Neurologic: Normal     Assessment RIGHT DISTAL RADIUS FRACTURE   Plan RIGHT DISTAL RADIUS OPEN REDUCTION AND INTERNAL FIXATION WITH REPAIR AS INDICATED   WE ARE PLANNING SURGERY FOR YOUR UPPER EXTREMITY. THE RISKS AND BENEFITS OF SURGERY INCLUDE BUT NOT LIMITED TO BLEEDING INFECTION, DAMAGE TO NEARBY NERVES ARTERIES TENDONS, FAILURE OF SURGERY TO ACCOMPLISH ITS INTENDED GOALS, PERSISTENT SYMPTOMS AND NEED FOR FURTHER SURGICAL INTERVENTION. WITH THIS IN MIND WE WILL PROCEED. I HAVE DISCUSSED WITH THE PATIENT THE PRE AND POSTOPERATIVE REGIMEN AND THE DOS AND DON'TS. PT VOICED UNDERSTANDING AND INFORMED CONSENT SIGNED.  R/B/A DISCUSSED WITH PT IN OFFICE.  PT VOICED UNDERSTANDING OF PLAN CONSENT SIGNED DAY OF SURGERY PT SEEN AND EXAMINED PRIOR TO OPERATIVE PROCEDURE/DAY OF SURGERY SITE MARKED. QUESTIONS ANSWERED WILL GO HOME FOLLOWING SURGERY  [facility]yl L[facility] MD 10/01/2018  Sheryl Lester 09/29/2018, 4:02  PM

## 2018-09-30 ENCOUNTER — Other Ambulatory Visit: Payer: Self-pay

## 2018-09-30 ENCOUNTER — Encounter (HOSPITAL_COMMUNITY): Payer: Self-pay | Admitting: *Deleted

## 2018-09-30 NOTE — Progress Notes (Signed)
Spoke with pt for pre-op call. Pt denies cardiac history, chest pain or sob. Pt has recently (since December) having low sodium. Pt had lab work done yesterday and sodium was up to 126 from 125 on Friday. Pt denies hx of Diabetes. Chart was forwarded to Anesthesia PA

## 2018-09-30 NOTE — Progress Notes (Signed)
Received verbal order from Dr. Tobias Alexander for repeat BMET on arrival and will need to be evaluated by anesthesia on arrival.

## 2018-09-30 NOTE — Anesthesia Preprocedure Evaluation (Addendum)
Anesthesia Evaluation  Patient identified by MRN, date of birth, ID band Patient awake    Reviewed: Allergy & Precautions, NPO status , Patient's Chart, lab work & pertinent test results, reviewed documented beta blocker date and time   History of Anesthesia Complications (+) history of anesthetic complications  Airway Mallampati: II  TM Distance: >3 FB Neck ROM: Full    Dental no notable dental hx. (+) Dental Advisory Given   Pulmonary sleep apnea ,    Pulmonary exam normal        Cardiovascular hypertension, Pt. on medications and Pt. on home beta blockers Normal cardiovascular exam  Study Conclusions  - Left ventricle: The cavity size was normal. Systolic function was   normal. The estimated ejection fraction was in the range of 55%   to 60%. Wall motion was normal; there were no regional wall   motion abnormalities. Left ventricular diastolic function   parameters were normal. - Aortic valve: There was moderate regurgitation. Regurgitation   pressure half-time: 392 ms. - Aorta: Ascending aortic diameter: 37 mm (S). - Ascending aorta: The ascending aorta was mildly dilated. - Mitral valve: Transvalvular velocity was within the normal range.   There was no evidence for stenosis. There was mild regurgitation. - Left atrium: The atrium was severely dilated. - Right ventricle: The cavity size was normal. Wall thickness was   normal. Systolic function was normal. - Right atrium: The atrium was mildly dilated. - Tricuspid valve: There was mild regurgitation. - Pulmonary arteries: Systolic pressure was mildly increased. PA   peak pressure: 48 mm Hg (S).   Neuro/Psych PSYCHIATRIC DISORDERS Anxiety Depression Bipolar Disorder negative neurological ROS     GI/Hepatic Neg liver ROS, GERD  ,  Endo/Other  Hypothyroidism   Renal/GU negative Renal ROS  negative genitourinary   Musculoskeletal negative musculoskeletal  ROS (+)   Abdominal   Peds negative pediatric ROS (+)  Hematology negative hematology ROS (+)   Anesthesia Other Findings   Reproductive/Obstetrics negative OB ROS                            Anesthesia Physical Anesthesia Plan  ASA: III  Anesthesia Plan: Regional and MAC   Post-op Pain Management:    Induction: Intravenous  PONV Risk Score and Plan: 3 and Ondansetron, Dexamethasone and Scopolamine patch - Pre-op  Airway Management Planned: LMA  Additional Equipment:   Intra-op Plan:   Post-operative Plan: Extubation in OR  Informed Consent: I have reviewed the patients History and Physical, chart, labs and discussed the procedure including the risks, benefits and alternatives for the proposed anesthesia with the patient or authorized representative who has indicated his/her understanding and acceptance.     Dental advisory given  Plan Discussed with: CRNA and Anesthesiologist  Anesthesia Plan Comments: (Hx of hyponatremia followed by PCP, baseline over past 6 months appears to be 125-130, most recently 126 on 09/29/18. Currently asymptomatic. Possibly secondary to medication side effects, PCP notes also mention polydipsia and encouraged free water restriction in favor of electrolyte fortified solutions. BMP to be drawn preop.  Dr. Tobias Alexander aware. Pt is also followed by Dr. Elsworth Soho for severe central sleep apnea and per recent notes is to be on BiPAP. Echo 1/20 with EF 55%-60%, nl wall motion, mod AR. )      Anesthesia Quick Evaluation

## 2018-10-01 ENCOUNTER — Encounter (HOSPITAL_COMMUNITY)
Admission: RE | Disposition: A | Discharge status: Home / Self Care | Payer: Self-pay | Source: Home / Self Care | Attending: Orthopedic Surgery

## 2018-10-01 ENCOUNTER — Ambulatory Visit (HOSPITAL_COMMUNITY)
Admission: RE | Admit: 2018-10-01 | Discharge: 2018-10-01 | Disposition: A | Discharge status: Home / Self Care | Payer: PRIVATE HEALTH INSURANCE | Attending: Orthopedic Surgery | Admitting: Orthopedic Surgery

## 2018-10-01 ENCOUNTER — Other Ambulatory Visit: Payer: Self-pay

## 2018-10-01 ENCOUNTER — Encounter (HOSPITAL_COMMUNITY): Payer: Self-pay | Admitting: Certified Registered Nurse Anesthetist

## 2018-10-01 ENCOUNTER — Ambulatory Visit (HOSPITAL_COMMUNITY): Payer: PRIVATE HEALTH INSURANCE | Admitting: Physician Assistant

## 2018-10-01 DIAGNOSIS — S52501A Unspecified fracture of the lower end of right radius, initial encounter for closed fracture: Secondary | ICD-10-CM | POA: Diagnosis not present

## 2018-10-01 DIAGNOSIS — Z803 Family history of malignant neoplasm of breast: Secondary | ICD-10-CM | POA: Insufficient documentation

## 2018-10-01 DIAGNOSIS — Z886 Allergy status to analgesic agent status: Secondary | ICD-10-CM | POA: Diagnosis not present

## 2018-10-01 DIAGNOSIS — Z833 Family history of diabetes mellitus: Secondary | ICD-10-CM | POA: Insufficient documentation

## 2018-10-01 DIAGNOSIS — E039 Hypothyroidism, unspecified: Secondary | ICD-10-CM | POA: Insufficient documentation

## 2018-10-01 DIAGNOSIS — Z85820 Personal history of malignant melanoma of skin: Secondary | ICD-10-CM | POA: Insufficient documentation

## 2018-10-01 DIAGNOSIS — F319 Bipolar disorder, unspecified: Secondary | ICD-10-CM | POA: Diagnosis not present

## 2018-10-01 DIAGNOSIS — Z8049 Family history of malignant neoplasm of other genital organs: Secondary | ICD-10-CM | POA: Insufficient documentation

## 2018-10-01 DIAGNOSIS — W19XXXA Unspecified fall, initial encounter: Secondary | ICD-10-CM | POA: Insufficient documentation

## 2018-10-01 DIAGNOSIS — I10 Essential (primary) hypertension: Secondary | ICD-10-CM | POA: Diagnosis not present

## 2018-10-01 DIAGNOSIS — Z885 Allergy status to narcotic agent status: Secondary | ICD-10-CM | POA: Insufficient documentation

## 2018-10-01 DIAGNOSIS — F419 Anxiety disorder, unspecified: Secondary | ICD-10-CM | POA: Diagnosis not present

## 2018-10-01 DIAGNOSIS — J45909 Unspecified asthma, uncomplicated: Secondary | ICD-10-CM | POA: Insufficient documentation

## 2018-10-01 DIAGNOSIS — Z808 Family history of malignant neoplasm of other organs or systems: Secondary | ICD-10-CM | POA: Diagnosis not present

## 2018-10-01 DIAGNOSIS — Z90710 Acquired absence of both cervix and uterus: Secondary | ICD-10-CM | POA: Diagnosis not present

## 2018-10-01 DIAGNOSIS — D649 Anemia, unspecified: Secondary | ICD-10-CM | POA: Diagnosis not present

## 2018-10-01 DIAGNOSIS — I351 Nonrheumatic aortic (valve) insufficiency: Secondary | ICD-10-CM | POA: Diagnosis not present

## 2018-10-01 DIAGNOSIS — Z882 Allergy status to sulfonamides status: Secondary | ICD-10-CM | POA: Diagnosis not present

## 2018-10-01 DIAGNOSIS — Z887 Allergy status to serum and vaccine status: Secondary | ICD-10-CM | POA: Diagnosis not present

## 2018-10-01 DIAGNOSIS — G4733 Obstructive sleep apnea (adult) (pediatric): Secondary | ICD-10-CM | POA: Insufficient documentation

## 2018-10-01 DIAGNOSIS — Z9049 Acquired absence of other specified parts of digestive tract: Secondary | ICD-10-CM | POA: Diagnosis not present

## 2018-10-01 DIAGNOSIS — I872 Venous insufficiency (chronic) (peripheral): Secondary | ICD-10-CM | POA: Insufficient documentation

## 2018-10-01 DIAGNOSIS — Z818 Family history of other mental and behavioral disorders: Secondary | ICD-10-CM | POA: Insufficient documentation

## 2018-10-01 DIAGNOSIS — S52501P Unspecified fracture of the lower end of right radius, subsequent encounter for closed fracture with malunion: Secondary | ICD-10-CM

## 2018-10-01 DIAGNOSIS — G43909 Migraine, unspecified, not intractable, without status migrainosus: Secondary | ICD-10-CM | POA: Insufficient documentation

## 2018-10-01 DIAGNOSIS — K219 Gastro-esophageal reflux disease without esophagitis: Secondary | ICD-10-CM | POA: Diagnosis not present

## 2018-10-01 DIAGNOSIS — Z8249 Family history of ischemic heart disease and other diseases of the circulatory system: Secondary | ICD-10-CM | POA: Diagnosis not present

## 2018-10-01 HISTORY — DX: Hypothyroidism, unspecified: E03.9

## 2018-10-01 HISTORY — PX: OPEN REDUCTION INTERNAL FIXATION (ORIF) DISTAL RADIAL FRACTURE: SHX5989

## 2018-10-01 HISTORY — DX: Unspecified osteoarthritis, unspecified site: M19.90

## 2018-10-01 LAB — BASIC METABOLIC PANEL
Anion gap: 11 (ref 5–15)
BUN: 16 mg/dL (ref 6–20)
CO2: 22 mmol/L (ref 22–32)
Calcium: 8.8 mg/dL — ABNORMAL LOW (ref 8.9–10.3)
Chloride: 97 mmol/L — ABNORMAL LOW (ref 98–111)
Creatinine, Ser: 0.7 mg/dL (ref 0.44–1.00)
GFR calc Af Amer: >60 mL/min (ref >60)
GFR calc non Af Amer: >60 mL/min (ref >60)
Glucose, Bld: 88 mg/dL (ref 70–99)
Potassium: 4.7 mmol/L (ref 3.5–5.1)
Sodium: 130 mmol/L — ABNORMAL LOW (ref 135–145)

## 2018-10-01 LAB — CBC
HCT: 34.9 % — ABNORMAL LOW (ref 36.0–46.0)
Hemoglobin: 11.7 g/dL — ABNORMAL LOW (ref 12.0–15.0)
MCH: 29 pg (ref 26.0–34.0)
MCHC: 33.5 g/dL (ref 30.0–36.0)
MCV: 86.4 fL (ref 80.0–100.0)
Platelets: 245 10*3/uL (ref 150–400)
RBC: 4.04 MIL/uL (ref 3.87–5.11)
RDW: 12 % (ref 11.5–15.5)
WBC: 5.9 10*3/uL (ref 4.0–10.5)
nRBC: 0 % (ref 0.0–0.2)

## 2018-10-01 SURGERY — OPEN REDUCTION INTERNAL FIXATION (ORIF) DISTAL RADIUS FRACTURE
Anesthesia: Monitor Anesthesia Care | Laterality: Right

## 2018-10-01 MED ORDER — FENTANYL CITRATE (PF) 100 MCG/2ML IJ SOLN
INTRAMUSCULAR | Status: AC
  Administered 2018-10-01: 100 ug via INTRAVENOUS
  Filled 2018-10-01: qty 2

## 2018-10-01 MED ORDER — CHLORHEXIDINE GLUCONATE 4 % EX LIQD: 60.0000 mL | Freq: Once | CUTANEOUS | Status: DC

## 2018-10-01 MED ORDER — EPHEDRINE 5 MG/ML INJ
INTRAVENOUS | Status: AC
  Filled 2018-10-01: qty 10

## 2018-10-01 MED ORDER — PHENYLEPHRINE 40 MCG/ML (10ML) SYRINGE FOR IV PUSH (FOR BLOOD PRESSURE SUPPORT)
PREFILLED_SYRINGE | INTRAVENOUS | Status: AC
  Filled 2018-10-01: qty 10

## 2018-10-01 MED ORDER — DEXAMETHASONE SODIUM PHOSPHATE 4 MG/ML IJ SOLN
INTRAMUSCULAR | Status: DC | PRN
  Administered 2018-10-01: 4 mg via INTRAVENOUS

## 2018-10-01 MED ORDER — MIDAZOLAM HCL 2 MG/2ML IJ SOLN
2.0000 mg | Freq: Once | INTRAMUSCULAR | Status: AC
  Administered 2018-10-01: 2 mg via INTRAVENOUS

## 2018-10-01 MED ORDER — ONDANSETRON HCL 4 MG/2ML IJ SOLN
INTRAMUSCULAR | Status: AC
  Filled 2018-10-01: qty 2

## 2018-10-01 MED ORDER — LACTATED RINGERS IV SOLN
INTRAVENOUS | Status: DC | PRN
  Administered 2018-10-01 (×2): via INTRAVENOUS

## 2018-10-01 MED ORDER — PROPOFOL 10 MG/ML IV BOLUS
INTRAVENOUS | Status: AC
  Filled 2018-10-01: qty 20

## 2018-10-01 MED ORDER — SCOPOLAMINE 1 MG/3DAYS TD PT72
MEDICATED_PATCH | TRANSDERMAL | Status: DC | PRN
  Administered 2018-10-01: 1 via TRANSDERMAL

## 2018-10-01 MED ORDER — FENTANYL CITRATE (PF) 250 MCG/5ML IJ SOLN
INTRAMUSCULAR | Status: AC
  Filled 2018-10-01: qty 5

## 2018-10-01 MED ORDER — BUPIVACAINE HCL (PF) 0.5 % IJ SOLN
INTRAMUSCULAR | Status: AC
  Filled 2018-10-01: qty 30

## 2018-10-01 MED ORDER — 0.9 % SODIUM CHLORIDE (POUR BTL) OPTIME
TOPICAL | Status: DC | PRN
  Administered 2018-10-01: 1000 mL

## 2018-10-01 MED ORDER — PROPOFOL 10 MG/ML IV BOLUS
INTRAVENOUS | Status: DC | PRN
  Administered 2018-10-01: 200 mg via INTRAVENOUS

## 2018-10-01 MED ORDER — CEFAZOLIN SODIUM-DEXTROSE 2-4 GM/100ML-% IV SOLN
2.0000 g | INTRAVENOUS | Status: AC
  Administered 2018-10-01: 2 g via INTRAVENOUS

## 2018-10-01 MED ORDER — ONDANSETRON HCL 4 MG/2ML IJ SOLN
INTRAMUSCULAR | Status: DC | PRN
  Administered 2018-10-01: 4 mg via INTRAVENOUS

## 2018-10-01 MED ORDER — LIDOCAINE 2% (20 MG/ML) 5 ML SYRINGE
INTRAMUSCULAR | Status: AC
  Filled 2018-10-01: qty 5

## 2018-10-01 MED ORDER — GLYCOPYRROLATE PF 0.2 MG/ML IJ SOSY
PREFILLED_SYRINGE | INTRAMUSCULAR | Status: AC
  Filled 2018-10-01: qty 1

## 2018-10-01 MED ORDER — CEFAZOLIN SODIUM-DEXTROSE 2-4 GM/100ML-% IV SOLN
INTRAVENOUS | Status: AC
  Filled 2018-10-01: qty 100

## 2018-10-01 MED ORDER — DEXAMETHASONE SODIUM PHOSPHATE 10 MG/ML IJ SOLN
INTRAMUSCULAR | Status: AC
  Filled 2018-10-01: qty 1

## 2018-10-01 MED ORDER — FENTANYL CITRATE (PF) 100 MCG/2ML IJ SOLN
100.0000 ug | Freq: Once | INTRAMUSCULAR | Status: AC
  Administered 2018-10-01: 100 ug via INTRAVENOUS

## 2018-10-01 MED ORDER — GLYCOPYRROLATE 0.2 MG/ML IJ SOLN
INTRAMUSCULAR | Status: DC | PRN
  Administered 2018-10-01: 0.2 mg via INTRAVENOUS

## 2018-10-01 MED ORDER — EPHEDRINE SULFATE-NACL 50-0.9 MG/10ML-% IV SOSY
PREFILLED_SYRINGE | INTRAVENOUS | Status: DC | PRN
  Administered 2018-10-01: 10 mg via INTRAVENOUS
  Administered 2018-10-01: 5 mg via INTRAVENOUS
  Administered 2018-10-01: 15 mg via INTRAVENOUS
  Administered 2018-10-01: 10 mg via INTRAVENOUS

## 2018-10-01 MED ORDER — MIDAZOLAM HCL 2 MG/2ML IJ SOLN
INTRAMUSCULAR | Status: AC
  Administered 2018-10-01: 2 mg via INTRAVENOUS
  Filled 2018-10-01: qty 2

## 2018-10-01 SURGICAL SUPPLY — 65 items
BANDAGE ACE 3X5.8 VEL STRL LF (GAUZE/BANDAGES/DRESSINGS) ×2 IMPLANT
BANDAGE ACE 4X5 VEL STRL LF (GAUZE/BANDAGES/DRESSINGS) ×2 IMPLANT
BIT DRILL 2.2 SS TIBIAL (BIT) ×1 IMPLANT
BLADE CLIPPER SURG (BLADE) IMPLANT
BNDG CMPR 9X4 STRL LF SNTH (GAUZE/BANDAGES/DRESSINGS) ×1
BNDG CMPR MED 10X6 ELC LF (GAUZE/BANDAGES/DRESSINGS) ×1
BNDG ELASTIC 6X10 VLCR STRL LF (GAUZE/BANDAGES/DRESSINGS) ×1 IMPLANT
BNDG ESMARK 4X9 LF (GAUZE/BANDAGES/DRESSINGS) ×2 IMPLANT
BNDG GAUZE ELAST 4 BULKY (GAUZE/BANDAGES/DRESSINGS) ×2 IMPLANT
CANISTER SUCT 3000ML PPV (MISCELLANEOUS) ×2 IMPLANT
CORD BI POLAR (MISCELLANEOUS) ×2 IMPLANT
COVER SURGICAL LIGHT HANDLE (MISCELLANEOUS) ×2 IMPLANT
COVER WAND RF STERILE (DRAPES) ×2 IMPLANT
CUFF TOURNIQUET SINGLE 18IN (TOURNIQUET CUFF) ×2 IMPLANT
CUFF TOURNIQUET SINGLE 24IN (TOURNIQUET CUFF) IMPLANT
DECANTER SPIKE VIAL GLASS SM (MISCELLANEOUS) ×2 IMPLANT
DRAPE OEC MINIVIEW 54X84 (DRAPES) ×2 IMPLANT
DRAPE SURG 17X11 SM STRL (DRAPES) ×2 IMPLANT
DRSG ADAPTIC 3X8 NADH LF (GAUZE/BANDAGES/DRESSINGS) ×2 IMPLANT
GAUZE 4X4 16PLY RFD (DISPOSABLE) ×2 IMPLANT
GAUZE SPONGE 4X4 12PLY STRL (GAUZE/BANDAGES/DRESSINGS) ×2 IMPLANT
GAUZE XEROFORM 5X9 LF (GAUZE/BANDAGES/DRESSINGS) ×2 IMPLANT
GLOVE BIOGEL PI IND STRL 8.5 (GLOVE) ×1 IMPLANT
GLOVE BIOGEL PI INDICATOR 8.5 (GLOVE) ×1
GLOVE SURG ORTHO 8.0 STRL STRW (GLOVE) ×2 IMPLANT
GOWN STRL REUS W/ TWL LRG LVL3 (GOWN DISPOSABLE) ×1 IMPLANT
GOWN STRL REUS W/ TWL XL LVL3 (GOWN DISPOSABLE) ×1 IMPLANT
GOWN STRL REUS W/TWL LRG LVL3 (GOWN DISPOSABLE) ×2
GOWN STRL REUS W/TWL XL LVL3 (GOWN DISPOSABLE) ×2
K-WIRE 1.6 (WIRE) ×2
K-WIRE FX5X1.6XNS BN SS (WIRE) ×1
KIT BASIN OR (CUSTOM PROCEDURE TRAY) ×2 IMPLANT
KIT TURNOVER KIT B (KITS) ×2 IMPLANT
KWIRE FX5X1.6XNS BN SS (WIRE) IMPLANT
NDL HYPO 25X1 1.5 SAFETY (NEEDLE) ×1 IMPLANT
NEEDLE HYPO 25X1 1.5 SAFETY (NEEDLE) ×2 IMPLANT
NS IRRIG 1000ML POUR BTL (IV SOLUTION) ×2 IMPLANT
PACK ORTHO EXTREMITY (CUSTOM PROCEDURE TRAY) ×2 IMPLANT
PAD ARMBOARD 7.5X6 YLW CONV (MISCELLANEOUS) ×4 IMPLANT
PAD CAST 4YDX4 CTTN HI CHSV (CAST SUPPLIES) ×1 IMPLANT
PADDING CAST COTTON 4X4 STRL (CAST SUPPLIES) ×2
PEG LOCKING SMOOTH 2.2X16 (Screw) ×2 IMPLANT
PEG LOCKING SMOOTH 2.2X18 (Peg) ×2 IMPLANT
PEG LOCKING SMOOTH 2.2X22 (Screw) ×2 IMPLANT
PLATE RIGHT VOLAR RIM DVR (Plate) ×1 IMPLANT
SCREW LOCK 14X2.7X 3 LD TPR (Screw) IMPLANT
SCREW LOCK 16X2.7X 3 LD TPR (Screw) IMPLANT
SCREW LOCK 20X2.7X 3 LD TPR (Screw) IMPLANT
SCREW LOCKING 2.7X14 (Screw) ×4 IMPLANT
SCREW LOCKING 2.7X16 (Screw) ×8 IMPLANT
SCREW LOCKING 2.7X20MM (Screw) ×2 IMPLANT
SCREW NLOCK 24X2.7 3 LD (Screw) IMPLANT
SCREW NONLOCK 2.7X24 (Screw) ×2 IMPLANT
SOAP 2 % CHG 4 OZ (WOUND CARE) ×2 IMPLANT
SPONGE LAP 4X18 RFD (DISPOSABLE) ×2 IMPLANT
SUT PROLENE 4 0 PS 2 18 (SUTURE) IMPLANT
SUT VIC AB 2-0 CT1 27 (SUTURE)
SUT VIC AB 2-0 CT1 TAPERPNT 27 (SUTURE) IMPLANT
SUT VICRYL 4-0 PS2 18IN ABS (SUTURE) IMPLANT
SYR CONTROL 10ML LL (SYRINGE) IMPLANT
TOWEL NATURAL 10PK STERILE (DISPOSABLE) ×2 IMPLANT
TOWEL NATURAL 6PK STERILE (DISPOSABLE) IMPLANT
TUBE CONNECTING 12X1/4 (SUCTIONS) ×2 IMPLANT
WATER STERILE IRR 1000ML POUR (IV SOLUTION) ×2 IMPLANT
YANKAUER SUCT BULB TIP NO VENT (SUCTIONS) IMPLANT

## 2018-10-01 NOTE — Op Note (Signed)
PREOPERATIVE DIAGNOSIS: Right distal radius malunion  POSTOPERATIVE DIAGNOSIS: Same  ATTENDING SURGEON: Dr. Iran Planas who was scrubbed and present entire procedure  ASSISTANT SURGEON: None  ANESTHESIA: General via LMA with regional anesthesia  OPERATIVE PROCEDURE: #1: Repair right distal radius malunion with internal fixation #2: Radiographs 3 views right wrist  IMPLANTS: Biomet DVR volar rim plate standard with 2 cc of Biomet stay graft bone graft substitute  RADIOGRAPHIC INTERPRETATION: AP lateral and oblique views of the wrist to show the volar plate fixation in place with good alignment in all planes  SURGICAL INDICATIONS: The patient is a right-hand-dominant female who sustained a closed injury over 1 month ago.  Patient was referred to me for treatment of the nascent malunion.  Risk benefits and alternatives discussed in detail with the patient and signed informed consent was obtained.  Risks include but not limited to bleeding infection damage nearby nerves arteries or tendons loss of motion of the wrist and digits incomplete relief of symptoms and need for further surgical intervention.  SURGICAL TECHNIQUE: Patient was palpated and found in the preoperative holding area to mark the permanent marker made on the right wrist indicate the correct operative site.  Patient brought back the operating placed supine on the anesthesia table with a general anesthetic was administered.  Preoperative antibiotics were given prior to skin incision.  A well-padded tourniquet was then placed on the right brachium [place] with appropriate drape.  The right upper extremities then prepped and draped normal sterile fashion.  A timeout was called the correct site was identified and the procedure then begun.  The limb was then elevated using Esmarch exsanguination the tourniquet insufflated to 250 mmHg.  A longitudinal incision made directly over the flexor carpi radialis.  Dissection carried down through the  skin and subcutaneous tissue.  The FCR sheath was then opened proximally and distally.  Going through the floor of the FCR sheath the FPL was swept out of the way the pronator quadratus elevated.  The malunion was then exposed.  Careful dissection was then carried out radially and the brachioradialis was then carefully released off the radial styloid.  Tendon tenotomy was then carried out to allow for exposure and takedown of the nascent malunion.  Careful dissection was then carried around the bone removing the fracture callus taking down the malunion.  This was done circumferentially around the bone allowing the distal segment to be mobilized.  Following this open reduction was then performed.  The volar plate was then applied based on the distal nature of the fracture the volar rim plate was chosen.  The defect from the takedown of the malunion was then packed with several cc of Biomet bone graft substitute.  After packing of the defect of the volar plate was then applied and held in place with K wires proximally and distally.  Distal fixation was then carried out with distal locking pegs.  Shaft was then fixed endplate height was then adjusted as well as length to the radial and ulnar columns.  There was good achievement of the length inclination and tilt.  The wound was then thoroughly irrigated.  Final radiographs were then obtained.  The pronator quadratus was then closed with 2-0 Vicryl suture.  The subcutaneous tissues were then closed with 4-0 Vicryl.  The skin was then closed with simple Prolene sutures.  Adaptic dressing and a sterile compressive bandage then applied.  The patient was then placed in a well-padded sugar tong splint extubated taken recovery in good  condition.  POSTOPERATIVE PLAN: Patient be discharged to home.  Patient be seen back in the office in 2 weeks for wound check suture removal x-rays application of a short arm cast.  Placed a therapy order begin the therapy order in therapy at  the 4-week mark.  Radiographs at each visit.

## 2018-10-01 NOTE — Anesthesia Procedure Notes (Signed)
Procedure Name: LMA Insertion Date/Time: 10/01/2018 4:10 PM Performed by: Wilburn Cornelia, CRNA Pre-anesthesia Checklist: Patient identified, Emergency Drugs available, Suction available, Patient being monitored and Timeout performed Patient Re-evaluated:Patient Re-evaluated prior to induction Oxygen Delivery Method: Circle system utilized Preoxygenation: Pre-oxygenation with 100% oxygen Induction Type: IV induction Ventilation: Mask ventilation without difficulty LMA: LMA inserted LMA Size: 4.0 Number of attempts: 1 Placement Confirmation: positive ETCO2 and breath sounds checked- equal and bilateral Tube secured with: Tape Dental Injury: Teeth and Oropharynx as per pre-operative assessment

## 2018-10-01 NOTE — Transfer of Care (Signed)
Immediate Anesthesia Transfer of Care Note  Patient: Sheryl Lester  Procedure(s) Performed: OPEN REDUCTION INTERNAL FIXATION (ORIF) DISTAL RADIAL FRACTURE (Right )  Patient Location: PACU  Anesthesia Type:GA combined with regional for post-op pain  Level of Consciousness: awake, alert  and oriented  Airway & Oxygen Therapy: Patient Spontanous Breathing and Patient connected to face mask oxygen  Post-op Assessment: Report given to RN and Post -op Vital signs reviewed and stable  Post vital signs: Reviewed and stable  Last Vitals:  Vitals Value Taken Time  BP 172/97 10/01/2018  5:38 PM  Temp    Pulse 78 10/01/2018  5:39 PM  Resp 17 10/01/2018  5:39 PM  SpO2 100 % 10/01/2018  5:39 PM  Vitals shown include unvalidated device data.  Last Pain:  Vitals:   10/01/18 1534  TempSrc:   PainSc: 6       Patients Stated Pain Goal: 5 (58/59/29 2446)  Complications: No apparent anesthesia complications

## 2018-10-01 NOTE — Anesthesia Postprocedure Evaluation (Signed)
Anesthesia Post Note  Patient: Sheryl Lester  Procedure(s) Performed: OPEN REDUCTION INTERNAL FIXATION (ORIF) DISTAL RADIAL FRACTURE (Right )     Patient location during evaluation: PACU Anesthesia Type: General Level of consciousness: sedated Pain management: pain level controlled Vital Signs Assessment: post-procedure vital signs reviewed and stable Respiratory status: spontaneous breathing and respiratory function stable Cardiovascular status: stable Postop Assessment: no apparent nausea or vomiting Anesthetic complications: no    Last Vitals:  Vitals:   10/01/18 1752 10/01/18 1822  BP: (!) 176/86 (!) 163/82  Pulse: 70 66  Resp: (!) 8 (!) 8  Temp:  36.4 C  SpO2: 94% 97%    Last Pain:  Vitals:   10/01/18 1822  TempSrc:   PainSc: 0-No pain                 Bridgitte Felicetti DANIEL

## 2018-10-01 NOTE — Progress Notes (Signed)
Candy from Dr. Lequita Asal office made aware that, per the pt, she fell last night, landing on the right wrist. Pt denies any discoloration or swelling. Splint is in place, and pt can move all of her fingers. Pt c/o more pain than before. Awaiting further instructions from the office.

## 2018-10-01 NOTE — Discharge Instructions (Signed)
KEEP BANDAGE CLEAN AND DRY CALL OFFICE FOR F/U APPT [phone] IN 2 WEEKS RX SENT TO [facility] KEEP HAND ELEVATED ABOVE HEART OK TO APPLY ICE TO OPERATIVE AREA CONTACT OFFICE IF ANY WORSENING PAIN OR CONCERNS.   Post Anesthesia Home Care Instructions  Activity: Get plenty of rest for the remainder of the day. A responsible individual must stay with you for 24 hours following the procedure.  For the next 24 hours, DO NOT: -Drive a car -Paediatric nurse -Drink alcoholic beverages -Take any medication unless instructed by your physician -Make any legal decisions or sign important papers.  Meals: Start with liquid foods such as gelatin or soup. Progress to regular foods as tolerated. Avoid greasy, spicy, heavy foods. If nausea and/or vomiting occur, drink only clear liquids until the nausea and/or vomiting subsides. Call your physician if vomiting continues.  Special Instructions/Symptoms: Your throat may feel dry or sore from the anesthesia or the breathing tube placed in your throat during surgery. If this causes discomfort, gargle with warm salt water. The discomfort should disappear within 24 hours.  If you had a scopolamine patch placed behind your ear for the management of post- operative nausea and/or vomiting:  1. The medication in the patch is effective for 72 hours, after which it should be removed.  Wrap patch in a tissue and discard in the trash. Wash hands thoroughly with soap and water. 2. You may remove the patch earlier than 72 hours if you experience unpleasant side effects which may include dry mouth, dizziness or visual disturbances. 3. Avoid touching the patch. Wash your hands with soap and water after contact with the patch.    Regional Anesthesia Blocks  1. Numbness or the inability to move the "blocked" extremity may last from 3-48 hours after placement. The length of time depends on the medication injected and your individual response to the medication. If the  numbness is not going away after 48 hours, call your surgeon.  2. The extremity that is blocked will need to be protected until the numbness is gone and the  Strength has returned. Because you cannot feel it, you will need to take extra care to avoid injury. Because it may be weak, you may have difficulty moving it or using it. You may not know what position it is in without looking at it while the block is in effect.  3. For blocks in the legs and feet, returning to weight bearing and walking needs to be done carefully. You will need to wait until the numbness is entirely gone and the strength has returned. You should be able to move your leg and foot normally before you try and bear weight or walk. You will need someone to be with you when you first try to ensure you do not fall and possibly risk injury.  4. Bruising and tenderness at the needle site are common side effects and will resolve in a few days.  5. Persistent numbness or new problems with movement should be communicated to the surgeon or the [place] [phone] [place] [phone]).

## 2018-10-03 ENCOUNTER — Encounter (HOSPITAL_COMMUNITY): Payer: Self-pay | Admitting: Orthopedic Surgery

## 2018-10-06 ENCOUNTER — Encounter (HOSPITAL_COMMUNITY): Payer: Self-pay | Admitting: Orthopedic Surgery

## 2018-10-06 MED ORDER — BUPIVACAINE-EPINEPHRINE (PF) 0.5% -1:200000 IJ SOLN
INTRAMUSCULAR | Status: DC | PRN
  Administered 2018-10-01: 30 mL via PERINEURAL

## 2018-10-06 NOTE — Addendum Note (Signed)
Addendum  created 10/06/18 1227 by Duane Boston, MD   Child order released for a procedure order, Clinical Note Signed, Intraprocedure Blocks edited, Intraprocedure Meds edited

## 2018-10-06 NOTE — Anesthesia Procedure Notes (Signed)
Anesthesia Regional Block: Supraclavicular block   Pre-Anesthetic Checklist: ,, timeout performed, Correct Patient, Correct Site, Correct Laterality, Correct Procedure, Correct Position, site marked, Risks and benefits discussed,  Surgical consent,  Pre-op evaluation,  At surgeon's request and post-op pain management  Laterality: Right  Prep: chloraprep       Needles:  Injection technique: Single-shot  Needle Type: Echogenic Stimulator Needle     Needle Length: 5cm  Needle Gauge: 22     Additional Needles:   Narrative:  Start time: 10/01/2018 3:46 PM End time: 10/01/2018 3:56 PM Injection made incrementally with aspirations every 5 mL.  Performed by: Personally  Anesthesiologist: Duane Boston, MD  Additional Notes: Functioning IV was confirmed and monitors applied.  A 25mm 22ga echogenic arrow stimulator was used. Sterile prep and drape,hand hygiene and sterile gloves were used.Ultrasound guidance: relevant anatomy identified, needle position confirmed, local anesthetic spread visualized around nerve(s)., vascular puncture avoided.  Image printed for medical record.  Negative aspiration and negative test dose prior to incremental administration of local anesthetic. The patient tolerated the procedure well.

## 2018-10-09 ENCOUNTER — Other Ambulatory Visit: Payer: Self-pay | Admitting: Internal Medicine

## 2018-10-20 NOTE — Progress Notes (Signed)
[facility] [place], [place] [address] Phone: [phone] Subjective:    I'm seeing this patient by the request  of:    CC: right ankle pain   MOQ:[id number]   09/18/2018: Right ankle arthritis.  Significant overall.  Did have an allergic reaction to the neoprene brace.  We discussed with patient about use of a lotion.  We discussed the possibility of custom bracing which patient declined.  Patient's told about over-the-counter shoes.  Continue the home exercises, icing regimen, follow-up with me again in 6 weeks  Update 10/21/2018: Sheryl Lester is a 63 y.o. female coming in with complaint of right ankle pain. Patient has increased pain in the morning. Patient feels that she is not improving any more.  Patient did recently fall and has been having discomfort overall.  Has not been able to do the exercises.   Recently did have a fall and was then closed fracture of the distal right radius nonunion surgery.  Past Medical History:  Diagnosis Date  . ALLERGIC RHINITIS 01/20/2008  . ANEMIA-IRON DEFICIENCY 01/20/2008  . ANXIETY 01/20/2008  . Arthritis   . ASTHMATIC BRONCHITIS, ACUTE 05/19/2008  . Bipolar disorder ([place])   . CENTRAL SLEEP APNEA CONDS CLASSIFIED ELSEWHERE 08/31/2010  . Complication of anesthesia 1987   "w/one of the anesthesia agents that they don't use anymore; chest muscles constricted; felt like I couldn't breath"  . DEPRESSION 01/20/2008  . DYSPHAGIA UNSPECIFIED 12/06/2008  . ELEVATED BLOOD PRESSURE WITHOUT DIAGNOSIS OF HYPERTENSION 12/06/2008  . GERD   . History of sleep walking   . HYPERLIPIDEMIA 01/20/2008  . HYPERTENSION 12/16/2008  . Hypothyroidism   . Melanoma ([place]) 2012   "skin of my stomach"  . Migraine    "related to menses; went away after hysterectomy"  . OSA treated with BiPAP   . OTITIS MEDIA, ACUTE, BILATERAL 12/16/2008  . OTITIS MEDIA, SEROUS, CHRONIC 12/30/2008  . PVD 05/12/2010  . Pyelonephritis "hospitalized in  ~ 1991"  . SINUSITIS- ACUTE-NOS 09/21/2008  . SWELLING MASS OR LUMP IN HEAD AND NECK 09/21/2008  . THYROID NODULE, LEFT    "unable to aspirate; has gotten very small"   . Trigeminal neuralgia 01/20/2008   right side o face  . Unspecified hearing loss 09/21/2008  . VENOUS INSUFFICIENCY, LEGS 05/03/2010   Past Surgical History:  Procedure Laterality Date  . ABDOMINAL HYSTERECTOMY  2000  . APPENDECTOMY  2000  . BRAIN SURGERY    . BREAST BIOPSY Left 1994   Benign  . 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 CHOLECYSTECTOMY N/A 12/13/2015   Procedure: LAPAROSCOPIC CHOLECYSTECTOMY;  Surgeon: Mickeal Skinner, MD;  Location: [facility];  Service: General;  Laterality: N/A;  . COLONOSCOPY     x 2  . INGUINAL HERNIA REPAIR Right 1987  . MELANOMA EXCISION  2015   "skin of my stomach"  . OOPHORECTOMY Bilateral 2000  . OPEN REDUCTION INTERNAL FIXATION (ORIF) DISTAL RADIAL FRACTURE Right 10/01/2018   Procedure: OPEN REDUCTION INTERNAL FIXATION (ORIF) DISTAL RADIAL FRACTURE;  Surgeon: Iran Planas, MD;  Location: [facility];  Service: Orthopedics;  Laterality: Right;  75 minutes   Social History   Socioeconomic History  . Marital status: Married    Spouse name: Not on file  . Number of children: 0  . Years of education: Not on file  . Highest education level: Not on file  Occupational History  . Occupation: disabled  Employer: UNEMPLOYED  Social Needs  . Financial resource strain: Not on file  . Food insecurity:    Worry: Not on file    Inability: Not on file  . Transportation needs:    Medical: Not on file    Non-medical: Not on file  Tobacco Use  . Smoking status: Never Smoker  . Smokeless tobacco: Never Used  Substance and Sexual Activity  . Alcohol use: No  . Drug use: No  . Sexual activity: Not Currently    Partners: Male    Birth control/protection: None  Lifestyle  . Physical activity:    Days per week: Not on file    Minutes per session:  Not on file  . Stress: Not on file  Relationships  . Social connections:    Talks on phone: Not on file    Gets together: Not on file    Attends religious service: Not on file    Active member of club or organization: Not on file    Attends meetings of clubs or organizations: Not on file    Relationship status: Not on file  Other Topics Concern  . Not on file  Social History Narrative  . Not on file   Allergies  Allergen Reactions  . Aripiprazole Swelling and Rash    *Abilify   . Fentanyl     Other reaction(s): Other Sleep Walking  . Avapro [Irbesartan] Other (See Comments)    hyponatremia  . Sulfamethoxazole-Trimethoprim Nausea And Vomiting  . Bupropion Rash    Wellbutrin   . Ibuprofen Nausea And Vomiting   Family History  Problem Relation Age of Onset  . Hyperlipidemia Mother   . Hypertension Mother   . Cancer - Cervical Mother   . Diabetes Father   . Melanoma Father   . Anxiety disorder Father   . Depression Father   . Melanoma Sister   . Anxiety disorder Sister   . Depression Sister   . Heart disease Paternal Grandmother   . Anxiety disorder Brother   . Depression Brother   . Breast cancer Paternal Aunt   . Heart disease Other        Mother side of family-several uncles   . Anxiety disorder Sister   . Depression Sister   . Colon cancer Neg Hx   . Stomach cancer Neg Hx     Current Outpatient Medications (Endocrine & Metabolic):  .  levothyroxine (SYNTHROID, LEVOTHROID) 50 MCG tablet, Take 1 tablet (50 mcg total) by mouth daily. (Patient taking differently: Take 50 mcg by mouth daily before breakfast. )  Current Outpatient Medications (Cardiovascular):  .  metoprolol tartrate (LOPRESSOR) 50 MG tablet, Take 1 tablet (50 mg total) by mouth 2 (two) times daily.   Current Outpatient Medications (Analgesics):  Marland Kitchen  Buprenorphine (BUTRANS) 20 MCG/HR PTWK, Place 20 mcg onto the skin every Tuesday.  Marland Kitchen  BUTRANS 10 MCG/HR PTWK patch, Take 10 mcg by mouth daily as  needed (for pain).  Marland Kitchen  ibuprofen (ADVIL,MOTRIN) 200 MG tablet, Take 400 mg by mouth every 6 (six) hours as needed (arm pain). .  NUCYNTA 100 MG TABS, Take 100 mg by mouth as needed (facial pain).    Current Outpatient Medications (Other):  .  busPIRone (BUSPAR) 30 MG tablet, Take 30 mg by mouth 2 (two) times daily.  .  dibucaine (NUPERCAINAL) 1 % ointment, Apply 1 application topically every morning. Marland Kitchen  EQUETRO 300 MG CP12, Take 600 mg by mouth 2 (two) times daily.  Marland Kitchen  mirtazapine (REMERON) 30 MG tablet, Take 30 mg by mouth at bedtime.  .  Misc Natural Products (TART CHERRY ADVANCED PO), Take 1 capsule by mouth 2 (two) times daily. .  naloxone (NARCAN) nasal spray 4 mg/0.1 mL, Place 1 spray into the nose as needed (for overdose).  .  Turmeric 500 MG CAPS, Take 2 capsules by mouth every morning. .  Vilazodone HCl (VIIBRYD) 40 MG TABS, Take 40 mg by mouth every evening.  .  Vitamin D, Ergocalciferol, (DRISDOL) 1.25 MG (50000 UT) CAPS capsule, Take 1 capsule (50,000 Units total) by mouth every 7 (seven) days. (Patient taking differently: Take 50,000 Units by mouth every Wednesday. )    Past medical history, social, surgical and family history all reviewed in electronic medical record.  No pertanent information unless stated regarding to the chief complaint.   Review of Systems:  No headache, visual changes, nausea, vomiting, diarrhea, constipation, dizziness, abdominal pain, skin rash, fevers, chills, night sweats, weight loss, swollen lymph nodes, body aches, joint swelling,  chest pain, shortness of breath, mood changes.  Positive muscle aches  Objective  Blood pressure 120/82, pulse 61, height 5\' 7"  (1.702 m), SpO2 98 %.    General: No apparent distress alert and oriented x3 mood and affect normal, dressed appropriately.  HEENT: Pupils equal, extraocular movements intact  Respiratory: Patient's speak in full sentences and does not appear short of breath  Cardiovascular: No lower  extremity edema, non tender, no erythema  Skin: Warm dry intact with no signs of infection or rash on extremities or on axial skeleton.  Abdomen: Soft nontender  Neuro: Cranial nerves II through XII are intact, neurovascularly intact in all extremities with 2+ DTRs and 2+ pulses.  Lymph: No lymphadenopathy of posterior or anterior cervical chain or axillae bilaterally.  Gait antalgic MSK: Tender to palpation in multiple different joints. Right ankle trace swelling compared to the contralateral side.  Limited range of motion in all planes.  Severely tender to palpation over the talar dome and the anterior aspect of the joint.  Decreased range of motion in all planes.  Neurovascular intact distally.    Impression and Recommendations:     This case required medical decision making of moderate complexity. The above documentation has been reviewed and is accurate and complete Lyndal Pulley, DO       Note: This dictation was prepared with Dragon dictation along with smaller phrase technology. Any transcriptional errors that result from this process are unintentional.

## 2018-10-21 ENCOUNTER — Other Ambulatory Visit: Payer: PRIVATE HEALTH INSURANCE

## 2018-10-21 ENCOUNTER — Encounter: Payer: Self-pay | Admitting: Family Medicine

## 2018-10-21 ENCOUNTER — Ambulatory Visit: Payer: PRIVATE HEALTH INSURANCE | Admitting: Family Medicine

## 2018-10-21 DIAGNOSIS — M19071 Primary osteoarthritis, right ankle and foot: Secondary | ICD-10-CM

## 2018-10-21 NOTE — Assessment & Plan Note (Signed)
Discussed with patient in great length.  Spent greater than 30 minutes with patient over the course of time.  We discussed about custom bracing again secondary to some instability which patient declined.  We discussed range of motion.  We discussed formal physical therapy with patient also declined.  We discussed the importance of proper shoes.  We discussed trying topical anti-inflammatories again which patient has declined.  Follow-up with me again in 6 to 8 weeks.  Spent  25 minutes with patient face-to-face and had greater than 50% of counseling including as described above in assessment and plan.

## 2018-10-21 NOTE — Patient Instructions (Signed)
This will be a process Rigid sole shoe or rocker bottom shoes for ankle arthritis  Finn comfort, [employer]oka Arahi 3, SAS are some good brands We will need to consider possible injections and possibel bracing at some point Try the bike I think would be great  See me again in 2-3 months

## 2018-11-05 ENCOUNTER — Encounter: Payer: Self-pay | Admitting: Internal Medicine

## 2018-11-05 LAB — SODIUM, URINE, RANDOM: SODIUM UR RANDOM: 61

## 2018-11-26 ENCOUNTER — Telehealth: Payer: Self-pay | Admitting: *Deleted

## 2018-11-26 NOTE — Telephone Encounter (Signed)
I called pt- virtual visit scheduled 11/27/18 @ 8:40 am. Sheryl Lester e mail sent to pt and PCP.

## 2018-11-26 NOTE — Telephone Encounter (Signed)
Copied from [place] [id number]. Topic: General - Other >> Nov 26, 2018  7:07 AM Keene Breath wrote: Reason for CRM: Patient called to inform the doctor that she has had a sore throat for over a week and it is something that is not going away.  Patient would like something called in OTC for this illness.  Patient does not want to come into the office and explained to the patient that she could have a virtual visit if she wanted.  Patient understood and would like the nurse or doctor to call her.  CB# [phone] >> Nov 26, 2018  8:28 AM Para Skeans A wrote: Please advise.

## 2018-11-26 NOTE — Telephone Encounter (Signed)
Can do virtual visit 

## 2018-11-27 ENCOUNTER — Ambulatory Visit (INDEPENDENT_AMBULATORY_CARE_PROVIDER_SITE_OTHER): Payer: 59 | Admitting: Internal Medicine

## 2018-11-27 ENCOUNTER — Ambulatory Visit: Payer: Self-pay | Admitting: Internal Medicine

## 2018-11-27 ENCOUNTER — Encounter: Payer: Self-pay | Admitting: Internal Medicine

## 2018-11-27 DIAGNOSIS — J301 Allergic rhinitis due to pollen: Secondary | ICD-10-CM

## 2018-11-27 NOTE — Progress Notes (Signed)
Virtual Visit via Video Note  I connected with Sheryl Lester on 11/27/18 at 10:40 AM EDT by a video enabled telemedicine application and verified that I am speaking with the correct person using two identifiers.   I discussed the limitations of evaluation and management by telemedicine and the availability of in person appointments. The patient expressed understanding and agreed to proceed.  History of Present Illness: The patient is a 63 y.o. YO female with visit for sore throat and allergy symptoms. Started about 2 weeks ago. Has some drainage down throat and sore throat which is worse in the morning. Denies fevers or chills or cough or body aches or headaches. Denies known contact with positive covid-19 case. Is social distancing. Overall it is stable. Has tried nothing except warm liquids which seem to help some.   Observations/Objective: Appearance: normal, breathing appears normal, casual grooming, abdomen does not appear distended, throat mild red no abscess appreciated, mental status is A and O times 3  Assessment and Plan: See problem oriented charting  Follow Up Instructions: start taking zyrtec, continue social distancing  I discussed the assessment and treatment plan with the patient. The patient was provided an opportunity to ask questions and all were answered. The patient agreed with the plan and demonstrated an understanding of the instructions.   The patient was advised to call back or seek an in-person evaluation if the symptoms worsen or if the condition fails to improve as anticipated.  Hoyt Koch, MD

## 2018-11-27 NOTE — Assessment & Plan Note (Signed)
Likely causing sore throat. Advised to start zyrtec and contact us if worsening symptoms or fevers or chills. Counseled about covid-19 and does not sound to be related but discussed symptoms to monitor for and importance of social distancing.

## 2019-01-01 ENCOUNTER — Ambulatory Visit: Payer: Self-pay

## 2019-01-01 NOTE — Telephone Encounter (Signed)
Spoke with patient today. She states she has spoken with Bri about her throat pain before and this has been going on since April. Virtual visit offered to her today but patient request in office appointment with provider. Scheduled her for tomorrow morning and informed patient if Dr.Crawford advised different she would be contacted.

## 2019-01-01 NOTE — Telephone Encounter (Signed)
Patient was told today she would need to do VV per Dr. Sharlet Salina before we could offer in office appointment. Patient declined so appointment cancelled for in office on tomorrow. She stated she would just give it a couple of days and see what happens.

## 2019-01-01 NOTE — Telephone Encounter (Signed)
Patient called to discuss her sore throat as noted below, she says she really wants to speak to Dr. Nathanial Millman nurse is she not available. I advised we answer the calls for the practice and triage the patient's symptoms, she says I really want to talk to Dr. Nathanial Millman nurse. I called the office and spoke to [place], [facility] who placed me on hold to see if Dr. Nathanial Millman nurse is available, she says she's not available. I advised the patient that the nurse is not available and I will send her concerns to the office and she will call back, patient verbalized understanding.  Message from Ivar Drape sent at 01/01/2019 8:32 AM EDT   Patient called on 11/26/2018 to inform the doctor that she has had a sore throat for over a week and it is something that is not going away. Patient called again today stating that she still has a sore throat and would like to talk to someone about it, because she thinks something else is going on. CB# [phone] Please call after 10:30am     Reason for Disposition . [1] Caller requests to speak ONLY to PCP AND [2] NON-URGENT question  Protocols used: PCP CALL - NO TRIAGE-A-AH

## 2019-01-02 ENCOUNTER — Ambulatory Visit: Payer: 59 | Admitting: Internal Medicine

## 2019-01-11 IMAGING — DX DG WRIST COMPLETE 3+V*R*
4 series · 4 of 4 positions shown · non-contrast
Comparison: None.

CLINICAL DATA: Status post fall, with acute onset of right wrist
swelling. Initial encounter.

EXAM:
RIGHT WRIST - COMPLETE 3+ VIEW

[wrist pa]
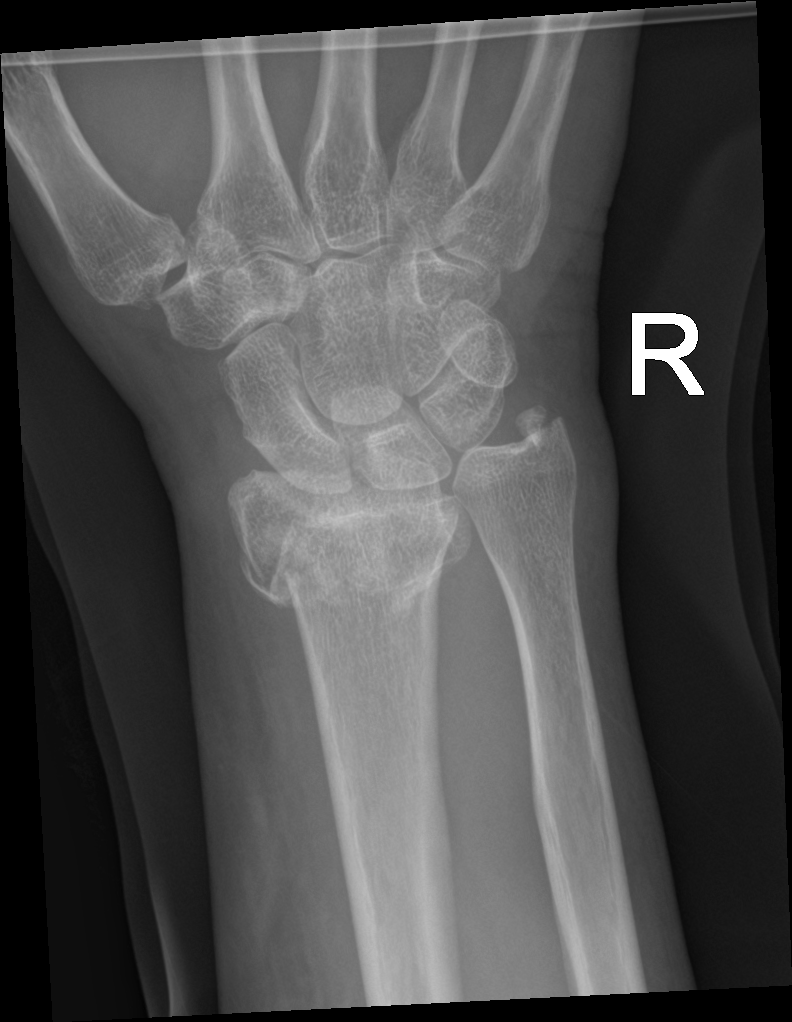

[wrist obl]
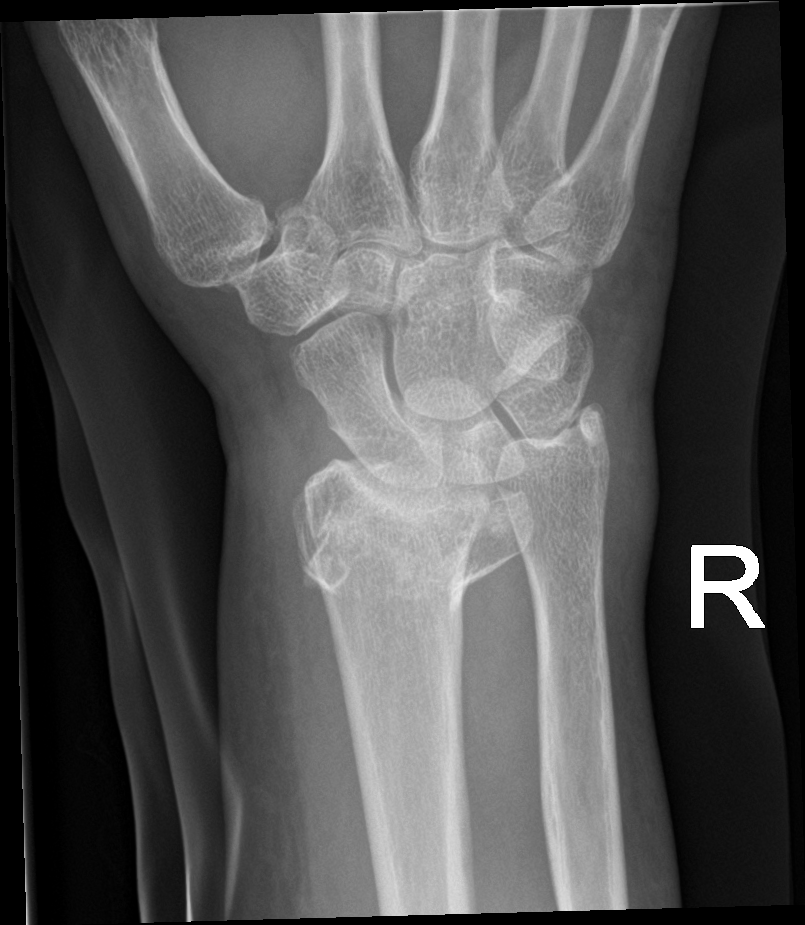

[wrist lat]
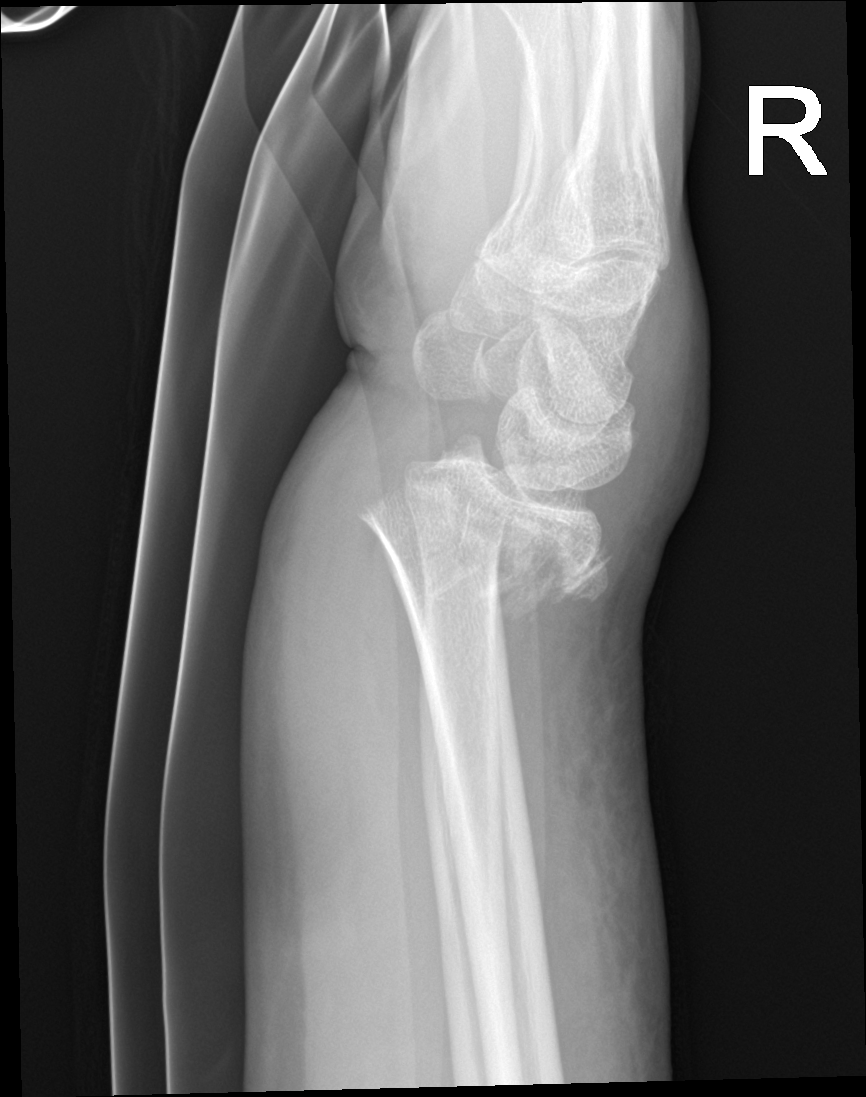

[wrist navicular]
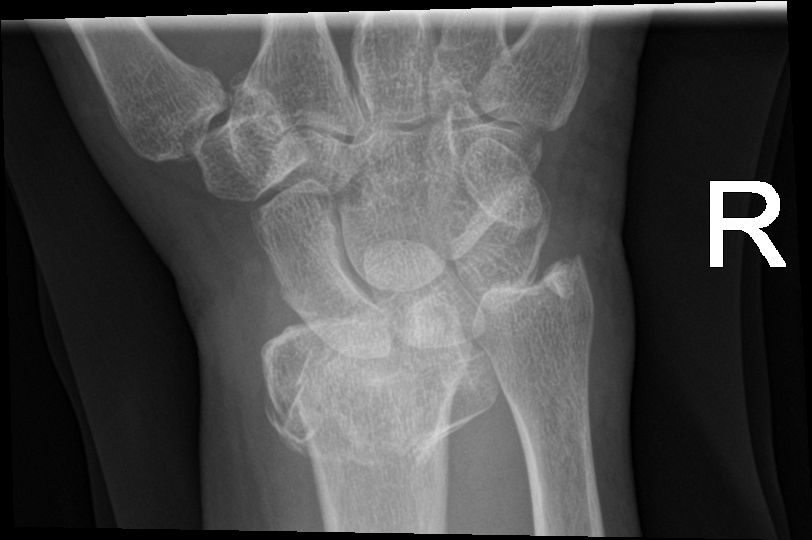

[4 of 4 positions shown; findings below may reference images not displayed]

FINDINGS: There is a comminuted and impacted fracture of the distal radial
metaphysis, with marked dorsal displacement, shortening and dorsal
angulation. A displaced ulnar styloid fracture is also noted.

The carpal rows articulate with the displaced distal radial
fragments. Soft tissue swelling is noted about the wrist. The carpal
rows appear grossly intact, and otherwise demonstrate normal
alignment.
IMPRESSION: Comminuted and impacted fracture of the distal radial metaphysis,
with marked dorsal displacement, shortening and dorsal angulation.
Displaced ulnar styloid fracture also noted.

## 2019-01-12 ENCOUNTER — Other Ambulatory Visit: Payer: Self-pay | Admitting: Internal Medicine

## 2019-01-19 NOTE — Progress Notes (Deleted)
[facility] [address] [place], [place] [address] Phone: [phone] Subjective:    I'm seeing this patient by the request  of:    CC: Right ankle pain follow-up  [id number]  Sheryl Lester is a 63 y.o. female coming in with complaint of ***  Onset-  Location Duration-  Character- Aggravating factors- Reliving factors-  Therapies tried-  Severity-     Past Medical History:  Diagnosis Date  . ALLERGIC RHINITIS 01/20/2008  . ANEMIA-IRON DEFICIENCY 01/20/2008  . ANXIETY 01/20/2008  . Arthritis   . ASTHMATIC BRONCHITIS, ACUTE 05/19/2008  . Bipolar disorder ([place])   . CENTRAL SLEEP APNEA CONDS CLASSIFIED ELSEWHERE 08/31/2010  . Complication of anesthesia 1987   "w/one of the anesthesia agents that they don't use anymore; chest muscles constricted; felt like I couldn't breath"  . DEPRESSION 01/20/2008  . DYSPHAGIA UNSPECIFIED 12/06/2008  . ELEVATED BLOOD PRESSURE WITHOUT DIAGNOSIS OF HYPERTENSION 12/06/2008  . GERD   . History of sleep walking   . HYPERLIPIDEMIA 01/20/2008  . HYPERTENSION 12/16/2008  . Hypothyroidism   . Melanoma ([place]) 2012   "skin of my stomach"  . Migraine    "related to menses; went away after hysterectomy"  . OSA treated with BiPAP   . OTITIS MEDIA, ACUTE, BILATERAL 12/16/2008  . OTITIS MEDIA, SEROUS, CHRONIC 12/30/2008  . PVD 05/12/2010  . Pyelonephritis "hospitalized in ~ 1991"  . SINUSITIS- ACUTE-NOS 09/21/2008  . SWELLING MASS OR LUMP IN HEAD AND NECK 09/21/2008  . THYROID NODULE, LEFT    "unable to aspirate; has gotten very small"   . Trigeminal neuralgia 01/20/2008   right side o face  . Unspecified hearing loss 09/21/2008  . VENOUS INSUFFICIENCY, LEGS 05/03/2010   Past Surgical History:  Procedure Laterality Date  . ABDOMINAL HYSTERECTOMY  2000  . APPENDECTOMY  2000  . BRAIN SURGERY    . BREAST BIOPSY Left 1994   Benign  . 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  CHOLECYSTECTOMY N/A 12/13/2015   Procedure: LAPAROSCOPIC CHOLECYSTECTOMY;  Surgeon: Mickeal Skinner, MD;  Location: [facility];  Service: General;  Laterality: N/A;  . COLONOSCOPY     x 2  . INGUINAL HERNIA REPAIR Right 1987  . MELANOMA EXCISION  2015   "skin of my stomach"  . OOPHORECTOMY Bilateral 2000  . OPEN REDUCTION INTERNAL FIXATION (ORIF) DISTAL RADIAL FRACTURE Right 10/01/2018   Procedure: OPEN REDUCTION INTERNAL FIXATION (ORIF) DISTAL RADIAL FRACTURE;  Surgeon: Iran Planas, MD;  Location: [facility];  Service: Orthopedics;  Laterality: Right;  75 minutes   Social History   Socioeconomic History  . Marital status: Married    Spouse name: Not on file  . Number of children: 0  . Years of education: Not on file  . Highest education level: Not on file  Occupational History  . Occupation: disabled    Fish farm manager: UNEMPLOYED  Social Needs  . Financial resource strain: Not on file  . Food insecurity:    Worry: Not on file    Inability: Not on file  . Transportation needs:    Medical: Not on file    Non-medical: Not on file  Tobacco Use  . Smoking status: Never Smoker  . Smokeless tobacco: Never Used  Substance and Sexual Activity  . Alcohol use: No  . Drug use: No  . Sexual activity: Not Currently    Partners: Male    Birth control/protection: None  Lifestyle  .  Physical activity:    Days per week: Not on file    Minutes per session: Not on file  . Stress: Not on file  Relationships  . Social connections:    Talks on phone: Not on file    Gets together: Not on file    Attends religious service: Not on file    Active member of club or organization: Not on file    Attends meetings of clubs or organizations: Not on file    Relationship status: Not on file  Other Topics Concern  . Not on file  Social History Narrative  . Not on file   Allergies  Allergen Reactions  . Aripiprazole Swelling and Rash    *Abilify   . Fentanyl     Other reaction(s): Other Sleep Walking   . Avapro [Irbesartan] Other (See Comments)    hyponatremia  . Sulfamethoxazole-Trimethoprim Nausea And Vomiting  . Bupropion Rash    Wellbutrin   . Ibuprofen Nausea And Vomiting   Family History  Problem Relation Age of Onset  . Hyperlipidemia Mother   . Hypertension Mother   . Cancer - Cervical Mother   . Diabetes Father   . Melanoma Father   . Anxiety disorder Father   . Depression Father   . Melanoma Sister   . Anxiety disorder Sister   . Depression Sister   . Heart disease Paternal Grandmother   . Anxiety disorder Brother   . Depression Brother   . Breast cancer Paternal Aunt   . Heart disease Other        Mother side of family-several uncles   . Anxiety disorder Sister   . Depression Sister   . Colon cancer Neg Hx   . Stomach cancer Neg Hx     Current Outpatient Medications (Endocrine & Metabolic):  .  levothyroxine (SYNTHROID, LEVOTHROID) 50 MCG tablet, Take 1 tablet (50 mcg total) by mouth daily. (Patient taking differently: Take 50 mcg by mouth daily before breakfast. )  Current Outpatient Medications (Cardiovascular):  .  metoprolol tartrate (LOPRESSOR) 50 MG tablet, TAKE 1 TABLET BY MOUTH TWICE A DAY   Current Outpatient Medications (Analgesics):  Marland Kitchen  Buprenorphine (BUTRANS) 20 MCG/HR PTWK, Place 20 mcg onto the skin every Tuesday.  Marland Kitchen  BUTRANS 10 MCG/HR PTWK patch, Take 10 mcg by mouth daily as needed (for pain).  Marland Kitchen  ibuprofen (ADVIL,MOTRIN) 200 MG tablet, Take 400 mg by mouth every 6 (six) hours as needed (arm pain). .  NUCYNTA 100 MG TABS, Take 100 mg by mouth as needed (facial pain).    Current Outpatient Medications (Other):  .  busPIRone (BUSPAR) 30 MG tablet, Take 30 mg by mouth 2 (two) times daily.  .  dibucaine (NUPERCAINAL) 1 % ointment, Apply 1 application topically every morning. Marland Kitchen  EQUETRO 300 MG CP12, Take 600 mg by mouth 2 (two) times daily.  .  mirtazapine (REMERON) 30 MG tablet, Take 30 mg by mouth at bedtime.  .  Misc Natural Products (TART  CHERRY ADVANCED PO), Take 1 capsule by mouth 2 (two) times daily. .  naloxone (NARCAN) nasal spray 4 mg/0.1 mL, Place 1 spray into the nose as needed (for overdose).  .  Turmeric 500 MG CAPS, Take 2 capsules by mouth every morning. .  Vilazodone HCl (VIIBRYD) 40 MG TABS, Take 40 mg by mouth every evening.  .  Vitamin D, Ergocalciferol, (DRISDOL) 1.25 MG (50000 UT) CAPS capsule, Take 1 capsule (50,000 Units total) by mouth every 7 (seven) days. (  Patient taking differently: Take 50,000 Units by mouth every Wednesday. )    Past medical history, social, surgical and family history all reviewed in electronic medical record.  No pertanent information unless stated regarding to the chief complaint.   Review of Systems:  No headache, visual changes, nausea, vomiting, diarrhea, constipation, dizziness, abdominal pain, skin rash, fevers, chills, night sweats, weight loss, swollen lymph nodes, body aches, joint swelling, muscle aches, chest pain, shortness of breath, mood changes.   Objective  There were no vitals taken for this visit.    General: No apparent distress alert and oriented x3 mood and affect normal, dressed appropriately.  HEENT: Pupils equal, extraocular movements intact  Respiratory: Patient's speak in full sentences and does not appear short of breath  Cardiovascular: No lower extremity edema, non tender, no erythema  Skin: Warm dry intact with no signs of infection or rash on extremities or on axial skeleton.  Abdomen: Soft nontender  Neuro: Cranial nerves II through XII are intact, neurovascularly intact in all extremities with 2+ DTRs and 2+ pulses.  Lymph: No lymphadenopathy of posterior or anterior cervical chain or axillae bilaterally.  Gait normal with good balance and coordination.  MSK:  Non tender with full range of motion and good stability and symmetric strength and tone of shoulders, elbows, wrist, hip, knee bilaterally.  Ankle: No visible erythema or swelling. Range  of motion is full in all directions. Strength is 5/5 in all directions. Stable lateral and medial ligaments; squeeze test and kleiger test unremarkable; Talar dome nontender; No pain at base of 5th MT; No tenderness over cuboid; No tenderness over N spot or navicular prominence No tenderness on posterior aspects of lateral and medial malleolus No sign of peroneal tendon subluxations or tenderness to palpation Negative tarsal tunnel tinel's Able to walk 4 steps.   Impression and Recommendations:     This case required medical decision making of moderate complexity. The above documentation has been reviewed and is accurate and complete Lyndal Pulley, DO       Note: This dictation was prepared with Dragon dictation along with smaller phrase technology. Any transcriptional errors that result from this process are unintentional.

## 2019-01-20 ENCOUNTER — Ambulatory Visit: Payer: Self-pay | Admitting: Family Medicine

## 2019-01-20 ENCOUNTER — Ambulatory Visit: Payer: 59 | Admitting: Family Medicine

## 2019-01-28 ENCOUNTER — Ambulatory Visit: Payer: 59 | Admitting: Family Medicine

## 2019-03-02 ENCOUNTER — Other Ambulatory Visit (INDEPENDENT_AMBULATORY_CARE_PROVIDER_SITE_OTHER): Payer: 59

## 2019-03-02 ENCOUNTER — Ambulatory Visit (INDEPENDENT_AMBULATORY_CARE_PROVIDER_SITE_OTHER): Payer: 59

## 2019-03-02 ENCOUNTER — Telehealth: Payer: Self-pay

## 2019-03-02 ENCOUNTER — Other Ambulatory Visit: Payer: Self-pay

## 2019-03-02 DIAGNOSIS — E538 Deficiency of other specified B group vitamins: Secondary | ICD-10-CM

## 2019-03-02 DIAGNOSIS — E871 Hypo-osmolality and hyponatremia: Secondary | ICD-10-CM | POA: Diagnosis not present

## 2019-03-02 DIAGNOSIS — Z299 Encounter for prophylactic measures, unspecified: Secondary | ICD-10-CM

## 2019-03-02 DIAGNOSIS — Z23 Encounter for immunization: Secondary | ICD-10-CM | POA: Diagnosis not present

## 2019-03-02 LAB — COMPREHENSIVE METABOLIC PANEL
ALT: 24 U/L (ref 0–35)
AST: 18 U/L (ref 0–37)
Albumin: 4.8 g/dL (ref 3.5–5.2)
Alkaline Phosphatase: 124 U/L — ABNORMAL HIGH (ref 39–117)
BUN: 30 mg/dL — ABNORMAL HIGH (ref 6–23)
CO2: 23 mEq/L (ref 19–32)
Calcium: 9.3 mg/dL (ref 8.4–10.5)
Chloride: 103 mEq/L (ref 96–112)
Creatinine, Ser: 1.01 mg/dL (ref 0.40–1.20)
GFR: 55.7 mL/min — ABNORMAL LOW (ref >60.00)
Glucose, Bld: 96 mg/dL (ref 70–99)
Potassium: 4.4 mEq/L (ref 3.5–5.1)
Sodium: 137 mEq/L (ref 135–145)
Total Bilirubin: 0.3 mg/dL (ref 0.2–1.2)
Total Protein: 7.6 g/dL (ref 6.0–8.3)

## 2019-03-02 LAB — VITAMIN B12: Vitamin B-12: 327 pg/mL (ref 211–911)

## 2019-03-02 LAB — T4, FREE: Free T4: 0.54 ng/dL — ABNORMAL LOW (ref 0.60–1.60)

## 2019-03-02 LAB — TSH: TSH: 1.06 u[IU]/mL (ref 0.35–4.50)

## 2019-03-02 LAB — VITAMIN D 25 HYDROXY (VIT D DEFICIENCY, FRACTURES): VITD: 19.39 ng/mL — ABNORMAL LOW (ref 30.00–100.00)

## 2019-03-02 NOTE — Telephone Encounter (Signed)
Patient is really really concerned about having to come into office, her husband is very immunocomprimised and she is too scared to take that risk with him----but is also very concerned that her TSH, sodium and b12 labs have not been checked recently---patient is wanting to know if she can come for lab work and then schedule video visit with dr crawford a few days later to go over results (video visit)---please advise, I will call patient back---also patient would like to take prescription strength Vitamin D again--should she also have Vitamin D lab drawn, too?  Please advise, I will let patient know, thanks

## 2019-03-02 NOTE — Telephone Encounter (Signed)
Labs placed, okay to do virtual after results return. Can address if need for vitamin D after labs at visit.

## 2019-03-02 NOTE — Telephone Encounter (Signed)
Patient to be advised at her parking lot nurse visit today

## 2019-03-05 ENCOUNTER — Telehealth: Payer: Self-pay

## 2019-03-05 NOTE — Telephone Encounter (Signed)
Would not be able to do in person visit given symptoms. It is not unusual to have reaction with shingles vaccine. If persistent symptoms may be eligible to get covid-19 testing depending on symptoms. Can take tylenol for chills.

## 2019-03-05 NOTE — Telephone Encounter (Signed)
[facility] night at 10:25pm---recd 2nd shingrix vaccine on 03/02/19-(nurse visit in parking lot)---late yesterday developed fever of 99.9 and has had chills. No trouble breathing, states she is also having pain in ears and just feels very cold---please advise if you think patient needs to have visit with dr crawford or what any other recommendations would be--routing to dr crawford, please advise, I will call patient back at [phone]

## 2019-03-05 NOTE — Telephone Encounter (Signed)
Patient advised of dr crawfords note/instructions---she is feeling better today than yesterday---patient advised if she worsens in the next few days or doesn't continue to feel better in the next couple of days, call us back to discuss--pt reports no rash, no swelling, no redness at injection site or going down arm--no shortness of breath, no n/v/d---denies covid testing right now

## 2019-07-03 ENCOUNTER — Other Ambulatory Visit: Payer: Self-pay | Admitting: Internal Medicine

## 2019-07-05 ENCOUNTER — Other Ambulatory Visit: Payer: Self-pay | Admitting: Internal Medicine

## 2019-08-26 ENCOUNTER — Ambulatory Visit: Payer: 59 | Attending: Internal Medicine

## 2019-08-26 DIAGNOSIS — Z20822 Contact with and (suspected) exposure to covid-19: Secondary | ICD-10-CM

## 2019-08-27 LAB — NOVEL CORONAVIRUS, NAA: SARS-CoV-2, NAA: NOT DETECTED

## 2019-09-01 ENCOUNTER — Ambulatory Visit: Payer: Self-pay

## 2019-09-01 NOTE — Telephone Encounter (Signed)
Incoming call from Patient .  Reporting that on last Sunday night .  She became disoriented.  Parked her car began walking around disoriented , didn't know where she was. Feels as though she had an ischemic episode.  Reviewed protocol.  Recommended Patient would go to Urgent Care or Ed.  Provided Patient with address of Urgent Care.  Voiced understanding.  Wasn't able to completete assessment.  Patient was anxious.  Wanted to know where she could be further assessed  quickly.  Abruptly hung up to go to destination.               Reason for Disposition . Headache  (and neurologic deficit)  Answer Assessment - Initial Assessment Questions 1. SYMPTOM: "What is the main symptom you are concerned about?" (e.g., weakness, numbness)   Was disorient Sunday night 2. ONSET: "When did this start?" (minutes, hours, days; while sleeping)     Sunday 3. LAST NORMAL: "When was the last time you were normal (no symptoms)?"    Sunday afternoon 4. PATTERN "Does this come and go, or has it been constant since it started?"  "Is it present now?"     One time event.   5. CARDIAC SYMPTOMS: "Have you had any of the following symptoms: chest pain, difficulty breathing, palpitations?"    denies 6. NEUROLOGIC SYMPTOMS: "Have you had any of the following symptoms: headache, dizziness, vision loss, double vision, changes in speech, unsteady on your feet?"     Did not answer.  7. OTHER SYMPTOMS: "Do you have any other symptoms?"     Did not answer.  8. PREGNANCY: "Is there any chance you are pregnant?" "When was your last menstrual period?"     na  Protocols used: NEUROLOGIC DEFICIT-A-AH

## 2019-09-01 NOTE — Telephone Encounter (Signed)
Noted  

## 2019-09-22 ENCOUNTER — Ambulatory Visit: Payer: 59 | Admitting: Family Medicine

## 2019-09-22 ENCOUNTER — Other Ambulatory Visit: Payer: 59

## 2019-09-22 ENCOUNTER — Other Ambulatory Visit: Payer: Self-pay

## 2019-09-22 ENCOUNTER — Encounter: Payer: Self-pay | Admitting: Internal Medicine

## 2019-09-22 ENCOUNTER — Ambulatory Visit (INDEPENDENT_AMBULATORY_CARE_PROVIDER_SITE_OTHER): Payer: 59

## 2019-09-22 ENCOUNTER — Ambulatory Visit (INDEPENDENT_AMBULATORY_CARE_PROVIDER_SITE_OTHER): Payer: 59 | Admitting: Internal Medicine

## 2019-09-22 VITALS — BP 132/88 | HR 64 | Ht 67.0 in | Wt 171.0 lb

## 2019-09-22 VITALS — BP 122/78 | HR 55 | Temp 99.3°F | Ht 67.0 in

## 2019-09-22 DIAGNOSIS — M25571 Pain in right ankle and joints of right foot: Secondary | ICD-10-CM

## 2019-09-22 DIAGNOSIS — R410 Disorientation, unspecified: Secondary | ICD-10-CM | POA: Diagnosis not present

## 2019-09-22 DIAGNOSIS — G9340 Encephalopathy, unspecified: Secondary | ICD-10-CM

## 2019-09-22 DIAGNOSIS — M19071 Primary osteoarthritis, right ankle and foot: Secondary | ICD-10-CM

## 2019-09-22 DIAGNOSIS — G8929 Other chronic pain: Secondary | ICD-10-CM

## 2019-09-22 LAB — LIPID PANEL
Cholesterol: 202 mg/dL — ABNORMAL HIGH (ref 0–200)
HDL: 53.2 mg/dL (ref >39.00)
LDL Cholesterol: 133 mg/dL — ABNORMAL HIGH (ref 0–99)
NonHDL: 148.39
Total CHOL/HDL Ratio: 4
Triglycerides: 78 mg/dL (ref 0.0–149.0)
VLDL: 15.6 mg/dL (ref 0.0–40.0)

## 2019-09-22 LAB — CBC
HCT: 38.4 % (ref 36.0–46.0)
Hemoglobin: 12.5 g/dL (ref 12.0–15.0)
MCHC: 32.6 g/dL (ref 30.0–36.0)
MCV: 86.9 fl (ref 78.0–100.0)
Platelets: 242 10*3/uL (ref 150.0–400.0)
RBC: 4.42 Mil/uL (ref 3.87–5.11)
RDW: 12.7 % (ref 11.5–15.5)
WBC: 5.5 10*3/uL (ref 4.0–10.5)

## 2019-09-22 LAB — HEMOGLOBIN A1C: Hgb A1c MFr Bld: 5.8 % (ref 4.6–6.5)

## 2019-09-22 LAB — COMPREHENSIVE METABOLIC PANEL
ALT: 18 U/L (ref 0–35)
AST: 21 U/L (ref 0–37)
Albumin: 4.2 g/dL (ref 3.5–5.2)
Alkaline Phosphatase: 105 U/L (ref 39–117)
BUN: 15 mg/dL (ref 6–23)
CO2: 27 mEq/L (ref 19–32)
Calcium: 9.1 mg/dL (ref 8.4–10.5)
Chloride: 102 mEq/L (ref 96–112)
Creatinine, Ser: 0.76 mg/dL (ref 0.40–1.20)
GFR: 77.19 mL/min (ref >60.00)
Glucose, Bld: 88 mg/dL (ref 70–99)
Potassium: 4.4 mEq/L (ref 3.5–5.1)
Sodium: 136 mEq/L (ref 135–145)
Total Bilirubin: 0.3 mg/dL (ref 0.2–1.2)
Total Protein: 7 g/dL (ref 6.0–8.3)

## 2019-09-22 LAB — VITAMIN D 25 HYDROXY (VIT D DEFICIENCY, FRACTURES): VITD: 18.76 ng/mL — ABNORMAL LOW (ref 30.00–100.00)

## 2019-09-22 LAB — TSH: TSH: 1.21 u[IU]/mL (ref 0.35–4.50)

## 2019-09-22 LAB — T4, FREE: Free T4: 0.71 ng/dL (ref 0.60–1.60)

## 2019-09-22 LAB — VITAMIN B12: Vitamin B-12: 206 pg/mL — ABNORMAL LOW (ref 211–911)

## 2019-09-22 NOTE — Progress Notes (Signed)
[facility] [place] Phone: [phone] Subjective:   Sheryl Lester, am serving as a scribe for Dr. Hulan Saas. This visit occurred during the SARS-CoV-2 public health emergency.  Safety protocols were in place, including screening questions prior to the visit, additional usage of staff PPE, and extensive cleaning of exam room while observing appropriate contact time as indicated for disinfecting solutions.   I'm seeing this patient by the request  of:  Hoyt Koch, MD  CC: Right ankle pain follow-up  [id number]   09/18/2018 Right ankle arthritis.  Significant overall.  Did have an allergic reaction to the neoprene brace.  We discussed with patient about use of a lotion.  We discussed the possibility of custom bracing which patient declined.  Patient's told about over-the-counter shoes.  Continue the home exercises, icing regimen, follow-up with me again in 6 weeks.  Spent  25 minutes with patient face-to-face and had greater than 50% of counseling including as described above in assessment and plan.  Update 09/22/2019 Sheryl Lester is a 64 y.o. female coming in with complaint of right ankle arthritis. Patient states that her pain has been increasing surrounding the entire ankle. Pain is constant and is worse when she stands on her leg. Is using butrans patch for another issue but helps with this pain.     Past Medical History:  Diagnosis Date  . ALLERGIC RHINITIS 01/20/2008  . ANEMIA-IRON DEFICIENCY 01/20/2008  . ANXIETY 01/20/2008  . Arthritis   . ASTHMATIC BRONCHITIS, ACUTE 05/19/2008  . Bipolar disorder ([place])   . CENTRAL SLEEP APNEA CONDS CLASSIFIED ELSEWHERE 08/31/2010  . Complication of anesthesia 1987   "w/one of the anesthesia agents that they don't use anymore; chest muscles constricted; felt like I couldn't breath"  . DEPRESSION 01/20/2008  . DYSPHAGIA UNSPECIFIED 12/06/2008  . ELEVATED BLOOD PRESSURE  WITHOUT DIAGNOSIS OF HYPERTENSION 12/06/2008  . GERD   . History of sleep walking   . HYPERLIPIDEMIA 01/20/2008  . HYPERTENSION 12/16/2008  . Hypothyroidism   . Melanoma ([place]) 2012   "skin of my stomach"  . Migraine    "related to menses; went away after hysterectomy"  . OSA treated with BiPAP   . OTITIS MEDIA, ACUTE, BILATERAL 12/16/2008  . OTITIS MEDIA, SEROUS, CHRONIC 12/30/2008  . PVD 05/12/2010  . Pyelonephritis "hospitalized in ~ 1991"  . SINUSITIS- ACUTE-NOS 09/21/2008  . SWELLING MASS OR LUMP IN HEAD AND NECK 09/21/2008  . THYROID NODULE, LEFT    "unable to aspirate; has gotten very small"   . Trigeminal neuralgia 01/20/2008   right side o face  . Unspecified hearing loss 09/21/2008  . VENOUS INSUFFICIENCY, LEGS 05/03/2010   Past Surgical History:  Procedure Laterality Date  . ABDOMINAL HYSTERECTOMY  2000  . APPENDECTOMY  2000  . BRAIN SURGERY    . BREAST BIOPSY Left 1994   Benign  . 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 CHOLECYSTECTOMY N/A 12/13/2015   Procedure: LAPAROSCOPIC CHOLECYSTECTOMY;  Surgeon: Mickeal Skinner, MD;  Location: [facility];  Service: General;  Laterality: N/A;  . COLONOSCOPY     x 2  . INGUINAL HERNIA REPAIR Right 1987  . MELANOMA EXCISION  2015   "skin of my stomach"  . OOPHORECTOMY Bilateral 2000  . OPEN REDUCTION INTERNAL FIXATION (ORIF) DISTAL RADIAL FRACTURE Right 10/01/2018   Procedure: OPEN REDUCTION INTERNAL FIXATION (ORIF) DISTAL RADIAL FRACTURE;  Surgeon: Iran Planas, MD;  Location: [facility];  Service: Orthopedics;  Laterality: Right;  75 minutes   Social History   Socioeconomic History  . Marital status: Married    Spouse name: Not on file  . Number of children: 0  . Years of education: Not on file  . Highest education level: Not on file  Occupational History  . Occupation: disabled    Fish farm manager: UNEMPLOYED  Tobacco Use  . Smoking status: Never Smoker  . Smokeless tobacco: Never Used  Substance  and Sexual Activity  . Alcohol use: Lester  . Drug use: Lester  . Sexual activity: Not Currently    Partners: Male    Birth control/protection: None  Other Topics Concern  . Not on file  Social History Narrative  . Not on file   Social Determinants of Health   Financial Resource Strain:   . Difficulty of Paying Living Expenses: Not on file  Food Insecurity:   . Worried About Charity fundraiser in the Last Year: Not on file  . Ran Out of Food in the Last Year: Not on file  Transportation Needs:   . Lack of Transportation (Medical): Not on file  . Lack of Transportation (Non-Medical): Not on file  Physical Activity:   . Days of Exercise per Week: Not on file  . Minutes of Exercise per Session: Not on file  Stress:   . Feeling of Stress : Not on file  Social Connections:   . Frequency of Communication with Friends and Family: Not on file  . Frequency of Social Gatherings with Friends and Family: Not on file  . Attends Religious Services: Not on file  . Active Member of Clubs or Organizations: Not on file  . Attends Archivist Meetings: Not on file  . Marital Status: Not on file   Allergies  Allergen Reactions  . Aripiprazole Swelling and Rash    *Abilify   . Fentanyl     Other reaction(s): Other Sleep Walking  . Avapro [Irbesartan] Other (See Comments)    hyponatremia  . Sulfamethoxazole-Trimethoprim Nausea And Vomiting  . Bupropion Rash    Wellbutrin   . Ibuprofen Nausea And Vomiting   Family History  Problem Relation Age of Onset  . Hyperlipidemia Mother   . Hypertension Mother   . Cancer - Cervical Mother   . Diabetes Father   . Melanoma Father   . Anxiety disorder Father   . Depression Father   . Melanoma Sister   . Anxiety disorder Sister   . Depression Sister   . Heart disease Paternal Grandmother   . Anxiety disorder Brother   . Depression Brother   . Breast cancer Paternal Aunt   . Heart disease Other        Mother side of family-several uncles    . Anxiety disorder Sister   . Depression Sister   . Colon cancer Neg Hx   . Stomach cancer Neg Hx     Current Outpatient Medications (Endocrine & Metabolic):  .  levothyroxine (SYNTHROID) 50 MCG tablet, TAKE 1 TABLET BY MOUTH EVERY DAY  Current Outpatient Medications (Cardiovascular):  .  metoprolol tartrate (LOPRESSOR) 50 MG tablet, TAKE 1 TABLET BY MOUTH TWICE A DAY   Current Outpatient Medications (Analgesics):  Marland Kitchen  Buprenorphine (BUTRANS) 20 MCG/HR PTWK, Place 20 mcg onto the skin every Tuesday.  Marland Kitchen  BUTRANS 10 MCG/HR PTWK patch, Take 10 mcg by mouth daily as needed (for pain).  Marland Kitchen  ibuprofen (ADVIL,MOTRIN) 200 MG tablet, Take 400  mg by mouth every 6 (six) hours as needed (arm pain). .  NUCYNTA 100 MG TABS, Take 100 mg by mouth as needed (facial pain).    Current Outpatient Medications (Other):  .  busPIRone (BUSPAR) 30 MG tablet, Take 30 mg by mouth 2 (two) times daily.  .  dibucaine (NUPERCAINAL) 1 % ointment, Apply 1 application topically every morning. Marland Kitchen  EQUETRO 300 MG CP12, Take 600 mg by mouth 2 (two) times daily.  .  mirtazapine (REMERON) 30 MG tablet, Take 30 mg by mouth at bedtime.  .  Misc Natural Products (TART CHERRY ADVANCED PO), Take 1 capsule by mouth 2 (two) times daily. .  naloxone (NARCAN) nasal spray 4 mg/0.1 mL, Place 1 spray into the nose as needed (for overdose).  .  Turmeric 500 MG CAPS, Take 2 capsules by mouth every morning. .  Vilazodone HCl (VIIBRYD) 40 MG TABS, Take 40 mg by mouth every evening.  .  Vitamin D, Ergocalciferol, (DRISDOL) 1.25 MG (50000 UT) CAPS capsule, Take 1 capsule (50,000 Units total) by mouth every 7 (seven) days. (Patient taking differently: Take 50,000 Units by mouth every Wednesday. )   Reviewed prior external information including notes and imaging from  primary care provider As well as notes that were available from care everywhere and other healthcare systems.  Past medical history, social, surgical and family history all  reviewed in electronic medical record.  Lester pertanent information unless stated regarding to the chief complaint.   Review of Systems:  Lester headache, visual changes, nausea, vomiting, diarrhea, constipation, dizziness, abdominal pain, skin rash, fevers, chills, night sweats, weight loss, swollen lymph nodes, body aches, joint swelling, chest pain, shortness of breath, mood changes. POSITIVE muscle aches  Objective  Blood pressure 132/88, pulse 64, height 5\' 7"  (1.702 m), weight 171 lb (77.6 kg), SpO2 97 %.   General: Lester apparent distress alert and oriented x3 mood and affect normal, dressed appropriately.  HEENT: Pupils equal, extraocular movements intact  Respiratory: Patient's speak in full sentences and does not appear short of breath  Cardiovascular: Lester lower extremity edema, non tender, Lester erythema  Skin: Warm dry intact with Lester signs of infection or rash on extremities or on axial skeleton.  Abdomen: Soft nontender  Neuro: Cranial nerves II through XII are intact, neurovascularly intact in all extremities with 2+ DTRs and 2+ pulses.  Lymph: Lester lymphadenopathy of posterior or anterior cervical chain or axillae bilaterally.  Gait antalgic MSK:  Non tender with full range of motion and good stability and symmetric strength and tone of shoulders, elbows, wrist, hip, knee  Right ankle motion with some mild arthritic changes.  Patient has limited range of motion.  Some anterior impingement noted.  Patient does have a very mild positive Tinel's test.  Pain over the lateral anterior ankle joint  Procedure: Real-time Ultrasound Guided Injection of right ankle mortise Device: GE Logiq Q7 Ultrasound guided injection is preferred based studies that show increased duration, increased effect, greater accuracy, decreased procedural pain, increased response rate, and decreased cost with ultrasound guided versus blind injection.  Verbal informed consent obtained.  Time-out conducted.  Noted Lester overlying  erythema, induration, or other signs of local infection.  Skin prepped in a sterile fashion.  Local anesthesia: Topical Ethyl chloride.  With sterile technique and under real time ultrasound guidance: With a 25-gauge half inch needle injected with 0.5 cc of 0.5% Marcaine and 0.5 cc of Kenalog 40 mg/mL Completed without difficulty  Pain immediately resolved suggesting accurate  placement of the medication.  Advised to call if fevers/chills, erythema, induration, drainage, or persistent bleeding.  Images permanently stored and available for review in the ultrasound unit.  Impression: Technically successful ultrasound guided injection.   Impression and Recommendations:     This case required medical decision making of moderate complexity. The above documentation has been reviewed and is accurate and complete Sheryl Pulley, DO       Note: This dictation was prepared with Dragon dictation along with smaller phrase technology. Any transcriptional errors that result from this process are unintentional.

## 2019-09-22 NOTE — Assessment & Plan Note (Signed)
Patient was given injection, tolerated the procedure well.  We discussed different bracing again.  We discussed that this may be something that will need intermittent injections and we discussed other type of injections including PRP.  Has no significant medication changes but we did discuss compression sleeve and over-the-counter medications.  We discussed different imaging as well but I do not feel like it is necessary where we are at the moment with those already reviewing the imaging from outside sources.  Patient will follow up with me again in 6 to 8 weeks.  Total time with review as well as with patient's was 45 minutes.

## 2019-09-22 NOTE — Assessment & Plan Note (Signed)
Checking labs today including CBC, CMP, B12, thyroid, HgA1c, BNP, vitamin D. Checking MRI brain to rule out stroke or other etiology. It is possible that this could be medication related, seizure. Without evaluation during episode it is more difficult to ascertain the cause of the episode. Monitor for recurrence and asked her to seek care if another episode occurs at time of occurrence.

## 2019-09-22 NOTE — Patient Instructions (Signed)
We will check the labs today and the MRI.

## 2019-09-22 NOTE — Progress Notes (Signed)
   Subjective:   Patient ID: Sheryl Lester, female    DOB: 11-29-1955, 64 y.o.   MRN: [id number]  HPI  The patient is a 64 YO female coming in for acute concerns about a confusion episode. This occurred on Jan 3rd. She did leave her house around [place] and was going to go to a bookstore. She ended up calling home several hours later and was confused about where she was and what she was doing where she was at. Her husband called friends to try to look for her at that time. They then contacted police around 9pm and she was found on [address]. She does have many gaps in her memory and fog like memories which she does have. She denies numbness or weakness. She was not taken to be evaluated. She was just taken home by her husband and she slept until the next day. She denies fevers or chills. Denies nausea or vomiting. Prior low sodium levels not checked recently. She did talk to her pain management doctor about this as she has had memory issues with taking her breakthrough pain medication. She denies taking any of that in days prior to this episode. She has had no recurrence and woke the next day feeling normal. She denies knowing if she was hallucinating during that time and denies current hallucinations visual or auditory.   PMH, [facility], social history reviewed and updated  Review of Systems  Constitutional: Negative.   HENT: Negative.   Eyes: Negative.   Respiratory: Negative for cough, chest tightness and shortness of breath.   Cardiovascular: Negative for chest pain, palpitations and leg swelling.  Gastrointestinal: Negative for abdominal distention, abdominal pain, constipation, diarrhea, nausea and vomiting.  Musculoskeletal: Negative.   Skin: Negative.   Neurological: Negative.   Psychiatric/Behavioral: Positive for behavioral problems and confusion.    Objective:  Physical Exam Constitutional:      Appearance: She is well-developed.  HENT:     Head: Normocephalic and atraumatic.    Cardiovascular:     Rate and Rhythm: Normal rate and regular rhythm.  Pulmonary:     Effort: Pulmonary effort is normal. No respiratory distress.     Breath sounds: Normal breath sounds. No wheezing or rales.  Abdominal:     General: Bowel sounds are normal. There is no distension.     Palpations: Abdomen is soft.     Tenderness: There is no abdominal tenderness. There is no rebound.  Musculoskeletal:     Cervical back: Normal range of motion.  Skin:    General: Skin is warm and dry.  Neurological:     Mental Status: She is alert and oriented to person, place, and time.     Coordination: Coordination normal.     Vitals:   09/22/19 0759  BP: 122/78  Pulse: (!) 55  Temp: 99.3 F (37.4 C)  SpO2: 95%  Height: 5\' 7"  (1.702 m)    This visit occurred during the SARS-CoV-2 public health emergency.  Safety protocols were in place, including screening questions prior to the visit, additional usage of staff PPE, and extensive cleaning of exam room while observing appropriate contact time as indicated for disinfecting solutions.   Assessment & Plan:

## 2019-09-22 NOTE — Patient Instructions (Signed)
Body Helix size medium Get on exercise bike Every pound you lose will be 4 # to your ankle See me in 6 -8 weeks

## 2019-09-23 LAB — BRAIN NATRIURETIC PEPTIDE: BNP: 105.9 pg/mL — ABNORMAL HIGH (ref 0.0–100.0)

## 2019-09-30 ENCOUNTER — Telehealth: Payer: Self-pay | Admitting: Internal Medicine

## 2019-09-30 NOTE — Telephone Encounter (Signed)
Patient has called wanting to know if she needs to have MRI done?  States the MRI is to look for evidence from an incident she had on 08/30/19.  States she heard that after 3 weeks its hard to find evidence from a MRI.

## 2019-10-05 NOTE — Telephone Encounter (Signed)
   Patient wants to know if MRI is needed at this point.  She wants to know if Dr Sharlet Salina feels she should still have MRI before scheduling.

## 2019-10-05 NOTE — Telephone Encounter (Signed)
Pt has been informed and will call facility back to schedule.

## 2019-10-05 NOTE — Telephone Encounter (Signed)
I would recommend to still get done

## 2019-10-14 ENCOUNTER — Telehealth: Payer: Self-pay

## 2019-10-14 NOTE — Telephone Encounter (Signed)
New message    The patient has a referral for MRI without contrast appt on 10/15/2019.   [facility] is closing tomorrow due to the weather.   A reschedule appt will be the second week of March.   neurologist appt is on 10/19/19.   The patient needs the referral route to [facility]   Fax # [phone]

## 2019-10-15 ENCOUNTER — Other Ambulatory Visit: Payer: 59

## 2019-10-15 NOTE — Telephone Encounter (Signed)
Forwarding to schedulers

## 2019-10-16 NOTE — Telephone Encounter (Signed)
[facility] able to get her scheduled sooner. Spoke to pt and she is ok with new appt

## 2019-10-19 ENCOUNTER — Other Ambulatory Visit: Payer: 59

## 2019-10-23 ENCOUNTER — Other Ambulatory Visit: Payer: 59

## 2019-10-31 ENCOUNTER — Other Ambulatory Visit: Payer: 59

## 2019-11-04 ENCOUNTER — Other Ambulatory Visit: Payer: Self-pay

## 2019-11-04 ENCOUNTER — Ambulatory Visit
Admission: RE | Admit: 2019-11-04 | Discharge: 2019-11-04 | Disposition: A | Discharge status: Home / Self Care | Payer: 59 | Source: Ambulatory Visit | Attending: Internal Medicine | Admitting: Internal Medicine

## 2019-11-04 DIAGNOSIS — G9340 Encephalopathy, unspecified: Secondary | ICD-10-CM

## 2020-05-11 ENCOUNTER — Telehealth: Payer: Self-pay | Admitting: Internal Medicine

## 2020-05-11 NOTE — Telephone Encounter (Signed)
   Patient calling to request appointment with Dr Alain Marion. She is currently a patient of Dr Sharlet Salina Patient states her spouse Sheryl Lester was told Dr Alain Marion could become her new PCP.  Please advise

## 2020-05-17 NOTE — Telephone Encounter (Signed)
Are you okay with a TOC of this patient from Dr Sharlet Salina to you?  If so I will make a TOC appt.

## 2020-05-20 NOTE — Telephone Encounter (Signed)
Dr Alain Marion approved TOC. Dr Sharlet Salina is on leave, also approved by practice admin.   Pt called to notify her that [facility] approved by Dr Alain Marion with understanding that he will not be prescribing any controlled substances; she will need to continue with the pain clinic if she is doing it now. Pt verb understanding.  Pt states she has appt on 9/29 for right sided ear pain, will add TOC to appt.

## 2020-05-20 NOTE — Telephone Encounter (Signed)
OK to do a transfer of care with understanding that I will not be prescribing any controlled substances.  She will need to continue with the pain clinic if she is doing it now.  I cannot accept any new patients with active chronic pain management by myself. Thanks

## 2020-05-25 ENCOUNTER — Other Ambulatory Visit: Payer: Self-pay

## 2020-05-25 ENCOUNTER — Other Ambulatory Visit: Payer: Self-pay | Admitting: Internal Medicine

## 2020-05-25 ENCOUNTER — Ambulatory Visit: Payer: 59 | Admitting: Internal Medicine

## 2020-05-25 ENCOUNTER — Encounter: Payer: Self-pay | Admitting: Internal Medicine

## 2020-05-25 VITALS — BP 182/100 | HR 71 | Temp 98.5°F | Ht 67.0 in | Wt 172.0 lb

## 2020-05-25 DIAGNOSIS — E538 Deficiency of other specified B group vitamins: Secondary | ICD-10-CM | POA: Diagnosis not present

## 2020-05-25 DIAGNOSIS — F339 Major depressive disorder, recurrent, unspecified: Secondary | ICD-10-CM

## 2020-05-25 DIAGNOSIS — E559 Vitamin D deficiency, unspecified: Secondary | ICD-10-CM | POA: Diagnosis not present

## 2020-05-25 DIAGNOSIS — E042 Nontoxic multinodular goiter: Secondary | ICD-10-CM | POA: Diagnosis not present

## 2020-05-25 MED ORDER — MUPIROCIN 2 % EX OINT: TOPICAL_OINTMENT | CUTANEOUS | 0 refills | Status: DC

## 2020-05-25 NOTE — Patient Instructions (Addendum)
You can try Lion's Mane Mushroom extract or capsules for memory    These suggestions will probably help you to improve your metabolism if you are not overweight and to lose weight if you are overweight: 1.  Reduce your consumption of sugars and starches.  Eliminate high fructose corn syrup from your diet.  Reduce your consumption of processed foods.  For desserts try to have seasonal fruits, berries, nuts, cheeses or dark chocolate with more than 70% cacao. 2.  Do not snack 3.  You do not have to eat breakfast.  If you choose to have breakfast - eat plain greek yogurt, eggs, oatmeal (without sugar) - use honey if you need to. 4.  Drink water, freshly brewed unsweetened tea (green, black or herbal) or coffee.  Do not drink sodas including diet sodas , juices, beverages sweetened with artificial sweeteners. 5.  Reduce your consumption of refined grains. 6.  Avoid protein drinks such as Optifast, Slim fast etc. Eat chicken, fish, meat, dairy and beans for your sources of protein. 7.  Natural unprocessed fats like cold pressed virgin olive oil, butter, coconut oil are good for you.  Eat avocados. 8.  Increase your consumption of fiber.  Fruits, berries, vegetables, whole grains, flaxseed, chia seeds, beans, popcorn, nuts, oatmeal are good sources of fiber 9.  Use vinegar in your diet, i.e. apple cider vinegar, red wine or balsamic vinegar 10.  You can try fasting.  For example you can skip breakfast and lunch every other day (24-hour fast) 11.  Stress reduction, good night sleep, relaxation, meditation, yoga and other physical activity is likely to help you to maintain low weight too. 12.  If you drink alcohol, limit your alcohol intake to no more than 2 drinks a day.

## 2020-05-25 NOTE — Assessment & Plan Note (Signed)
Labs

## 2020-05-25 NOTE — Assessment & Plan Note (Addendum)
Labs Vit D, Vit B12  try Lion's Mane Mushroom extract or capsules for memory

## 2020-05-25 NOTE — Assessment & Plan Note (Signed)
Risks associated with treatment noncompliance were discussed. Compliance was encouraged. Labs  

## 2020-05-25 NOTE — Progress Notes (Signed)
Subjective:  Patient ID: Sheryl Lester, female    DOB: [birth year]  Age: 64 y.o. MRN: [id number]  CC: No chief complaint on file.   HPI Sheryl Lester presents for R ear pain C/o trigeminal neuralgia R -- Dr Mechele Daw[place] C/o wt gain 40 lbs in 1 year F/u Vit D and B12 def  Outpatient Medications Prior to Visit  Medication Sig Dispense Refill  . Buprenorphine (BUTRANS) 20 MCG/HR PTWK Place 20 mcg onto the skin every Tuesday.     . busPIRone (BUSPAR) 30 MG tablet Take 30 mg by mouth 2 (two) times daily.   5  . BUTRANS 10 MCG/HR PTWK patch Take 10 mcg by mouth daily as needed (for pain).   2  . dibucaine (NUPERCAINAL) 1 % ointment Apply 1 application topically every morning.    Marland Kitchen EQUETRO 300 MG CP12 Take 600 mg by mouth 2 (two) times daily.   11  . ibuprofen (ADVIL,MOTRIN) 200 MG tablet Take 400 mg by mouth every 6 (six) hours as needed (arm pain).    Marland Kitchen levothyroxine (SYNTHROID) 50 MCG tablet TAKE 1 TABLET BY MOUTH EVERY DAY 90 tablet 3  . metoprolol tartrate (LOPRESSOR) 50 MG tablet TAKE 1 TABLET BY MOUTH TWICE A DAY 180 tablet 1  . mirtazapine (REMERON) 30 MG tablet Take 30 mg by mouth at bedtime.   5  . Misc Natural Products (TART CHERRY ADVANCED PO) Take 1 capsule by mouth 2 (two) times daily.    . naloxone (NARCAN) nasal spray 4 mg/0.1 mL Place 1 spray into the nose as needed (for overdose).     . NUCYNTA 100 MG TABS Take 100 mg by mouth as needed (facial pain).     . Turmeric 500 MG CAPS Take 2 capsules by mouth every morning.    . Vilazodone HCl (VIIBRYD) 40 MG TABS Take 40 mg by mouth every evening.     . Vitamin D, Ergocalciferol, (DRISDOL) 1.25 MG (50000 UT) CAPS capsule Take 1 capsule (50,000 Units total) by mouth every 7 (seven) days. (Patient taking differently: Take 50,000 Units by mouth every Wednesday. ) 12 capsule 0   No facility-administered medications prior to visit.    ROS: Review of Systems  Constitutional: Negative for activity change, appetite change,  chills, fatigue and unexpected weight change.  HENT: Positive for ear pain. Negative for congestion, mouth sores and sinus pressure.   Eyes: Negative for visual disturbance.  Respiratory: Negative for cough and chest tightness.   Gastrointestinal: Negative for abdominal pain and nausea.  Genitourinary: Negative for difficulty urinating, frequency and vaginal pain.  Musculoskeletal: Negative for back pain and gait problem.  Skin: Negative for pallor and rash.  Neurological: Positive for headaches. Negative for dizziness, tremors, weakness and numbness.  Psychiatric/Behavioral: Positive for sleep disturbance. Negative for confusion. The patient is nervous/anxious.     Objective:  BP (!) 182/100 (BP Location: Left Arm, Patient Position: Sitting, Cuff Size: Normal)   Pulse 71   Temp 98.5 F (36.9 C) (Oral)   Ht 5\' 7"  (1.702 m)   Wt 172 lb (78 kg)   SpO2 98%   BMI 26.94 kg/m   BP Readings from Last 3 Encounters:  05/25/20 (!) 182/100  09/22/19 132/88  09/22/19 122/78    Wt Readings from Last 3 Encounters:  05/25/20 172 lb (78 kg)  09/22/19 171 lb (77.6 kg)  10/01/18 155 lb (70.3 kg)    Physical Exam Constitutional:      General: She is  not in acute distress.    Appearance: She is well-developed.  HENT:     Head: Normocephalic.     Right Ear: External ear normal.     Left Ear: External ear normal.     Nose: Nose normal.  Eyes:     General:        Right eye: No discharge.        Left eye: No discharge.     Conjunctiva/sclera: Conjunctivae normal.     Pupils: Pupils are equal, round, and reactive to light.  Neck:     Thyroid: No thyromegaly.     Vascular: No JVD.     Trachea: No tracheal deviation.  Cardiovascular:     Rate and Rhythm: Normal rate and regular rhythm.     Heart sounds: Normal heart sounds.  Pulmonary:     Effort: No respiratory distress.     Breath sounds: No stridor. No wheezing.  Abdominal:     General: Bowel sounds are normal. There is no  distension.     Palpations: Abdomen is soft. There is no mass.     Tenderness: There is no abdominal tenderness. There is no guarding or rebound.  Musculoskeletal:        General: Tenderness present.     Cervical back: Normal range of motion and neck supple.  Lymphadenopathy:     Cervical: No cervical adenopathy.  Skin:    Findings: No erythema or rash.  Neurological:     Cranial Nerves: No cranial nerve deficit.     Motor: No abnormal muscle tone.     Coordination: Coordination normal.     Deep Tendon Reflexes: Reflexes normal.  Psychiatric:        Behavior: Behavior normal.        Thought Content: Thought content normal.        Judgment: Judgment normal.    L wrist lesion   A total time of >45 minutes was spent preparing to see the patient, reviewing tests, x-rays, operative reports and outside records.  Also, obtaining history and performing comprehensive physical exam.  Additionally, counseling the patient regarding the above listed issues.   Finally, documenting clinical information in the health records, coordination of care, educating the patient on compliance for taking vitamins, the role of vitamins in her chronic pain syndrome. It is a complex case.    Lab Results  Component Value Date   WBC 5.5 09/22/2019   HGB 12.5 09/22/2019   HCT 38.4 09/22/2019   PLT 242.0 09/22/2019   GLUCOSE 88 09/22/2019   CHOL 202 (H) 09/22/2019   TRIG 78.0 09/22/2019   HDL 53.20 09/22/2019   LDLDIRECT 116.2 02/12/2013   LDLCALC 133 (H) 09/22/2019   ALT 18 09/22/2019   AST 21 09/22/2019   NA 136 09/22/2019   K 4.4 09/22/2019   CL 102 09/22/2019   CREATININE 0.76 09/22/2019   BUN 15 09/22/2019   CO2 27 09/22/2019   TSH 1.21 09/22/2019   INR 0.97 05/25/2018   HGBA1C 5.8 09/22/2019    MR Brain Wo Contrast  Result Date: 11/04/2019 CLINICAL DATA:  Encephalopathy.  Memory loss and confusion. EXAM: MRI HEAD WITHOUT CONTRAST TECHNIQUE: Multiplanar, multiecho pulse sequences of the  brain and surrounding structures were obtained without intravenous contrast. COMPARISON:  MRI brain 01/30/2012 FINDINGS: Brain: Ventricle size normal. Mild frontal lobe atrophy similar to the prior study. Negative for acute infarct. Small chronic infarct right lateral pons, new since the prior study. Small hyperintensity left frontal white  matter is new since the prior study and consistent with chronic ischemia. Negative for hemorrhage mass or edema Vascular: Normal arterial flow voids Skull and upper cervical spine: No focal skeletal lesion. Sinuses/Orbits: Mild mucosal edema paranasal sinuses.  Normal orbit Other: None IMPRESSION: No acute abnormality. Mild frontal lobe atrophy. Mild chronic ischemic changes most notably right lateral pons. Electronically Signed   By: Franchot Gallo M.D.   On: 11/04/2019 10:37    Assessment & Plan:    Walker Kehr, MD

## 2020-05-26 ENCOUNTER — Other Ambulatory Visit: Payer: Self-pay | Admitting: Internal Medicine

## 2020-05-26 LAB — CBC WITH DIFFERENTIAL/PLATELET
Absolute Monocytes: 351 cells/uL (ref 200–950)
Basophils Absolute: 32 cells/uL (ref 0–200)
Basophils Relative: 0.6 %
Eosinophils Absolute: 151 cells/uL (ref 15–500)
Eosinophils Relative: 2.8 %
HCT: 41.6 % (ref 35.0–45.0)
Hemoglobin: 13.4 g/dL (ref 11.7–15.5)
Lymphs Abs: 1318 cells/uL (ref 850–3900)
MCH: 30 pg (ref 27.0–33.0)
MCHC: 32.2 g/dL (ref 32.0–36.0)
MCV: 93.1 fL (ref 80.0–100.0)
MPV: 9.4 fL (ref 7.5–12.5)
Monocytes Relative: 6.5 %
Neutro Abs: 3548 cells/uL (ref 1500–7800)
Neutrophils Relative %: 65.7 %
Platelets: 287 10*3/uL (ref 140–400)
RBC: 4.47 10*6/uL (ref 3.80–5.10)
RDW: 13.8 % (ref 11.0–15.0)
Total Lymphocyte: 24.4 %
WBC: 5.4 10*3/uL (ref 3.8–10.8)

## 2020-05-26 LAB — T4, FREE: Free T4: 1.2 ng/dL (ref 0.8–1.8)

## 2020-05-26 LAB — TSH: TSH: 0.48 mIU/L (ref 0.40–4.50)

## 2020-05-26 LAB — VITAMIN D 25 HYDROXY (VIT D DEFICIENCY, FRACTURES): Vit D, 25-Hydroxy: 8 ng/mL — ABNORMAL LOW (ref 30–100)

## 2020-05-26 LAB — VITAMIN B12: Vitamin B-12: 307 pg/mL (ref 200–1100)

## 2020-05-26 LAB — T3, FREE: T3, Free: 3 pg/mL (ref 2.3–4.2)

## 2020-05-26 MED ORDER — B COMPLEX PLUS PO TABS: 1.0000 | ORAL_TABLET | Freq: Every day | ORAL | 3 refills | Status: DC

## 2020-05-26 MED ORDER — VITAMIN D3 1.25 MG (50000 UT) PO CAPS: 1.0000 | ORAL_CAPSULE | ORAL | 0 refills | Status: DC

## 2020-05-30 ENCOUNTER — Encounter: Payer: Self-pay | Admitting: Internal Medicine

## 2020-07-27 ENCOUNTER — Other Ambulatory Visit: Payer: Self-pay

## 2020-07-28 ENCOUNTER — Encounter: Payer: Self-pay | Admitting: Internal Medicine

## 2020-07-28 ENCOUNTER — Ambulatory Visit: Payer: 59 | Admitting: Internal Medicine

## 2020-07-28 VITALS — BP 136/90 | HR 79 | Temp 98.5°F | Wt 177.0 lb

## 2020-07-28 DIAGNOSIS — R1011 Right upper quadrant pain: Secondary | ICD-10-CM

## 2020-07-28 DIAGNOSIS — L02213 Cutaneous abscess of chest wall: Secondary | ICD-10-CM | POA: Diagnosis not present

## 2020-07-28 DIAGNOSIS — R110 Nausea: Secondary | ICD-10-CM | POA: Insufficient documentation

## 2020-07-28 DIAGNOSIS — L0291 Cutaneous abscess, unspecified: Secondary | ICD-10-CM | POA: Insufficient documentation

## 2020-07-28 DIAGNOSIS — R109 Unspecified abdominal pain: Secondary | ICD-10-CM | POA: Insufficient documentation

## 2020-07-28 LAB — URINALYSIS
Bilirubin Urine: NEGATIVE
Hgb urine dipstick: NEGATIVE
Ketones, ur: NEGATIVE
Leukocytes,Ua: NEGATIVE
Nitrite: NEGATIVE
Specific Gravity, Urine: 1.015 (ref 1.000–1.030)
Total Protein, Urine: NEGATIVE
Urine Glucose: NEGATIVE
Urobilinogen, UA: 0.2 (ref 0.0–1.0)
pH: 6 (ref 5.0–8.0)

## 2020-07-28 LAB — CBC WITH DIFFERENTIAL/PLATELET
Basophils Absolute: 0.1 10*3/uL (ref 0.0–0.1)
Basophils Relative: 0.9 % (ref 0.0–3.0)
Eosinophils Absolute: 0.3 10*3/uL (ref 0.0–0.7)
Eosinophils Relative: 3.4 % (ref 0.0–5.0)
HCT: 43.4 % (ref 36.0–46.0)
Hemoglobin: 14.5 g/dL (ref 12.0–15.0)
Lymphocytes Relative: 25.3 % (ref 12.0–46.0)
Lymphs Abs: 2 10*3/uL (ref 0.7–4.0)
MCHC: 33.4 g/dL (ref 30.0–36.0)
MCV: 90.8 fl (ref 78.0–100.0)
Monocytes Absolute: 0.7 10*3/uL (ref 0.1–1.0)
Monocytes Relative: 8.3 % (ref 3.0–12.0)
Neutro Abs: 5 10*3/uL (ref 1.4–7.7)
Neutrophils Relative %: 62.1 % (ref 43.0–77.0)
Platelets: 317 10*3/uL (ref 150.0–400.0)
RBC: 4.78 Mil/uL (ref 3.87–5.11)
RDW: 12.3 % (ref 11.5–15.5)
WBC: 8.1 10*3/uL (ref 4.0–10.5)

## 2020-07-28 LAB — COMPREHENSIVE METABOLIC PANEL
ALT: 48 U/L — ABNORMAL HIGH (ref 0–35)
AST: 26 U/L (ref 0–37)
Albumin: 4.9 g/dL (ref 3.5–5.2)
Alkaline Phosphatase: 99 U/L (ref 39–117)
BUN: 18 mg/dL (ref 6–23)
CO2: 28 mEq/L (ref 19–32)
Calcium: 10.4 mg/dL (ref 8.4–10.5)
Chloride: 95 mEq/L — ABNORMAL LOW (ref 96–112)
Creatinine, Ser: 1.01 mg/dL (ref 0.40–1.20)
GFR: 59.65 mL/min — ABNORMAL LOW (ref >60.00)
Glucose, Bld: 106 mg/dL — ABNORMAL HIGH (ref 70–99)
Potassium: 5.6 mEq/L — ABNORMAL HIGH (ref 3.5–5.1)
Sodium: 131 mEq/L — ABNORMAL LOW (ref 135–145)
Total Bilirubin: 0.7 mg/dL (ref 0.2–1.2)
Total Protein: 8.5 g/dL — ABNORMAL HIGH (ref 6.0–8.3)

## 2020-07-28 LAB — LIPASE: Lipase: 27 U/L (ref 11.0–59.0)

## 2020-07-28 MED ORDER — FAMOTIDINE 40 MG PO TABS: 40.0000 mg | ORAL_TABLET | Freq: Every day | ORAL | 1 refills | Status: DC

## 2020-07-28 MED ORDER — ONDANSETRON HCL 4 MG PO TABS: 4.0000 mg | ORAL_TABLET | Freq: Three times a day (TID) | ORAL | 0 refills | Status: DC | PRN

## 2020-07-28 MED ORDER — AMOXICILLIN-POT CLAVULANATE 875-125 MG PO TABS: 1.0000 | ORAL_TABLET | Freq: Two times a day (BID) | ORAL | 0 refills | Status: DC

## 2020-07-28 NOTE — Assessment & Plan Note (Addendum)
Sheryl Lester is status post cholecystectomy and appendectomy.   Zofran Labs Pepcid 40 Abdominal ultrasound

## 2020-07-28 NOTE — Patient Instructions (Signed)
Dear [name], Your labs/tests are ok, except for extremely low vitamin D level. You need to start with a prescription vitamin D followed by over-the-counter vitamin D 2000 units daily. Please pick up your prescription. Your vitamin B12 is in a low normal range. You can get over-the-counter vitamin B complex and take 1 daily. Sincerely, AP

## 2020-07-28 NOTE — Assessment & Plan Note (Signed)
Zofran prn

## 2020-07-28 NOTE — Assessment & Plan Note (Signed)
L breast Bactroban Augmentin

## 2020-07-28 NOTE — Progress Notes (Signed)
Subjective:  Patient ID: Sheryl Lester, female    DOB: 1956/07/11  Age: 64 y.o. MRN: [id number]  CC: Follow-up (2 MONTH F/U- Pt c/o having severe pain on her (R) side slong w/being nausated ) and Rash (Under (L) breast)   HPI Sheryl Lester presents for RUQ pain that started after she ate a yogurt and an apple at 8 pm - nausea, pain all night. Pt had a spicy meal at 7 pm. GB, appendix is out...  C/o a sore on L breast x 3 d - pain  Outpatient Medications Prior to Visit  Medication Sig Dispense Refill  . B Complex-Folic Acid (B COMPLEX PLUS) TABS Take 1 tablet by mouth daily. 100 tablet 3  . Buprenorphine (BUTRANS) 20 MCG/HR PTWK Place 20 mcg onto the skin every Tuesday.     . Cholecalciferol (VITAMIN D3) 1.25 MG (50000 UT) CAPS Take 1 capsule by mouth once a week. 12 capsule 0  . dibucaine (NUPERCAINAL) 1 % ointment Apply 1 application topically every morning.    Marland Kitchen levothyroxine (SYNTHROID) 50 MCG tablet TAKE 1 TABLET BY MOUTH EVERY DAY 90 tablet 3  . metoprolol tartrate (LOPRESSOR) 50 MG tablet TAKE 1 TABLET BY MOUTH TWICE A DAY 180 tablet 1  . mirtazapine (REMERON) 30 MG tablet Take 30 mg by mouth at bedtime.   5  . Misc Natural Products (TART CHERRY ADVANCED PO) Take 1 capsule by mouth 2 (two) times daily.    . mupirocin ointment (BACTROBAN) 2 % On leg wound w/dressing change  bid 30 g 0  . naloxone (NARCAN) nasal spray 4 mg/0.1 mL Place 1 spray into the nose as needed (for overdose).     . NUCYNTA 100 MG TABS Take 100 mg by mouth as needed (facial pain).     . TRINTELLIX 20 MG TABS tablet Take 20 mg by mouth daily.    . Turmeric 500 MG CAPS Take 2 capsules by mouth every morning.    . Vilazodone HCl (VIIBRYD) 40 MG TABS Take 40 mg by mouth every evening.     . busPIRone (BUSPAR) 30 MG tablet Take 30 mg by mouth 2 (two) times daily.  (Patient not taking: Reported on 07/28/2020)  5  . BUTRANS 10 MCG/HR PTWK patch Take 10 mcg by mouth daily as needed (for pain).  (Patient not  taking: Reported on 07/28/2020)  2  . EQUETRO 300 MG CP12 Take 600 mg by mouth 2 (two) times daily.  (Patient not taking: Reported on 07/28/2020)  11  . ibuprofen (ADVIL,MOTRIN) 200 MG tablet Take 400 mg by mouth every 6 (six) hours as needed (arm pain). (Patient not taking: Reported on 07/28/2020)    . Vitamin D, Ergocalciferol, (DRISDOL) 1.25 MG (50000 UT) CAPS capsule Take 1 capsule (50,000 Units total) by mouth every 7 (seven) days. (Patient not taking: Reported on 07/28/2020) 12 capsule 0   No facility-administered medications prior to visit.    ROS: Review of Systems  Constitutional: Negative for activity change, appetite change, chills, fatigue and unexpected weight change.  HENT: Negative for congestion, mouth sores and sinus pressure.   Eyes: Negative for visual disturbance.  Respiratory: Negative for cough and chest tightness.   Gastrointestinal: Positive for abdominal pain and nausea.  Genitourinary: Negative for difficulty urinating, frequency and vaginal pain.  Musculoskeletal: Negative for back pain and gait problem.  Skin: Positive for wound. Negative for pallor and rash.  Neurological: Negative for dizziness, tremors, weakness, numbness and headaches.  Psychiatric/Behavioral: Negative for  confusion and sleep disturbance.    Objective:  BP 136/90 (BP Location: Left Arm)   Pulse 79   Temp 98.5 F (36.9 C) (Oral)   Wt 177 lb (80.3 kg)   SpO2 96%   BMI 27.72 kg/m   BP Readings from Last 3 Encounters:  07/28/20 136/90  05/25/20 (!) 182/100  09/22/19 132/88    Wt Readings from Last 3 Encounters:  07/28/20 177 lb (80.3 kg)  05/25/20 172 lb (78 kg)  09/22/19 171 lb (77.6 kg)    Physical Exam Constitutional:      General: She is not in acute distress.    Appearance: Normal appearance. She is well-developed. She is obese. She is not toxic-appearing.  HENT:     Head: Normocephalic.     Right Ear: External ear normal.     Left Ear: External ear normal.     Nose:  Nose normal.  Eyes:     General:        Right eye: No discharge.        Left eye: No discharge.     Conjunctiva/sclera: Conjunctivae normal.     Pupils: Pupils are equal, round, and reactive to light.  Neck:     Thyroid: No thyromegaly.     Vascular: No JVD.     Trachea: No tracheal deviation.  Cardiovascular:     Rate and Rhythm: Normal rate and regular rhythm.     Heart sounds: Normal heart sounds.  Pulmonary:     Effort: No respiratory distress.     Breath sounds: No stridor. No wheezing.  Abdominal:     General: Bowel sounds are normal. There is no distension.     Palpations: Abdomen is soft. There is no mass.     Tenderness: There is abdominal tenderness. There is no guarding or rebound.  Musculoskeletal:        General: No tenderness.     Cervical back: Normal range of motion and neck supple.  Lymphadenopathy:     Cervical: No cervical adenopathy.  Skin:    Findings: Lesion present. No erythema or rash.  Neurological:     Mental Status: She is oriented to person, place, and time.     Cranial Nerves: No cranial nerve deficit.     Motor: No abnormal muscle tone.     Coordination: Coordination normal.     Deep Tendon Reflexes: Reflexes normal.  Psychiatric:        Behavior: Behavior normal.        Thought Content: Thought content normal.        Judgment: Judgment normal.   Right upper quadrant that is sensitive to palpation-no pain No mass, no rebound There is a 1 cm shallow ulcer at the bottom of left breast.  No discharge  Lab Results  Component Value Date   WBC 5.4 05/25/2020   HGB 13.4 05/25/2020   HCT 41.6 05/25/2020   PLT 287 05/25/2020   GLUCOSE 88 09/22/2019   CHOL 202 (H) 09/22/2019   TRIG 78.0 09/22/2019   HDL 53.20 09/22/2019   LDLDIRECT 116.2 02/12/2013   LDLCALC 133 (H) 09/22/2019   ALT 18 09/22/2019   AST 21 09/22/2019   NA 136 09/22/2019   K 4.4 09/22/2019   CL 102 09/22/2019   CREATININE 0.76 09/22/2019   BUN 15 09/22/2019   CO2 27  09/22/2019   TSH 0.48 05/25/2020   INR 0.97 05/25/2018   HGBA1C 5.8 09/22/2019    MR Brain Wo Contrast  Result Date: 11/04/2019 CLINICAL DATA:  Encephalopathy.  Memory loss and confusion. EXAM: MRI HEAD WITHOUT CONTRAST TECHNIQUE: Multiplanar, multiecho pulse sequences of the brain and surrounding structures were obtained without intravenous contrast. COMPARISON:  MRI brain 01/30/2012 FINDINGS: Brain: Ventricle size normal. Mild frontal lobe atrophy similar to the prior study. Negative for acute infarct. Small chronic infarct right lateral pons, new since the prior study. Small hyperintensity left frontal white matter is new since the prior study and consistent with chronic ischemia. Negative for hemorrhage mass or edema Vascular: Normal arterial flow voids Skull and upper cervical spine: No focal skeletal lesion. Sinuses/Orbits: Mild mucosal edema paranasal sinuses.  Normal orbit Other: None IMPRESSION: No acute abnormality. Mild frontal lobe atrophy. Mild chronic ischemic changes most notably right lateral pons. Electronically Signed   By: Franchot Gallo M.D.   On: 11/04/2019 10:37    Assessment & Plan:    Walker Kehr, MD

## 2020-07-29 ENCOUNTER — Telehealth: Payer: Self-pay | Admitting: Internal Medicine

## 2020-07-29 NOTE — Telephone Encounter (Signed)
Patient called and said that [facility] did not have any appointments in time for her f/u with Dr. Alain Marion and she was wondering if there was another imagining place that she could go to to have an MRI done. She can be reached at [phone]

## 2020-07-31 ENCOUNTER — Other Ambulatory Visit: Payer: Self-pay | Admitting: Internal Medicine

## 2020-08-01 ENCOUNTER — Telehealth: Payer: Self-pay | Admitting: Internal Medicine

## 2020-08-01 NOTE — Telephone Encounter (Signed)
Okay to stop Augmentin. Thanks

## 2020-08-01 NOTE — Telephone Encounter (Signed)
Patient called and said that she is having some diarrhea and she thinks its because of the medication that she is on ondansetron (ZOFRAN) 4 MG tablet  famotidine (PEPCID) 40 MG table amoxicillin-clavulanate (AUGMENTIN) 875-125 MG tablet  She was wanting a call back [phone]

## 2020-08-02 ENCOUNTER — Other Ambulatory Visit: Payer: Self-pay | Admitting: Internal Medicine

## 2020-08-02 DIAGNOSIS — Z1231 Encounter for screening mammogram for malignant neoplasm of breast: Secondary | ICD-10-CM

## 2020-08-02 NOTE — Telephone Encounter (Signed)
Notified pt w/MD response.../lmb 

## 2020-08-02 NOTE — Telephone Encounter (Signed)
Pt call back requesting to speak w/[facility] concerning MRI that MD ordered.Marland KitchenJohny Lester

## 2020-08-03 ENCOUNTER — Ambulatory Visit
Admission: RE | Admit: 2020-08-03 | Discharge: 2020-08-03 | Disposition: A | Discharge status: Home / Self Care | Payer: 59 | Source: Ambulatory Visit | Attending: Internal Medicine | Admitting: Internal Medicine

## 2020-08-03 ENCOUNTER — Other Ambulatory Visit: Payer: Self-pay

## 2020-08-03 DIAGNOSIS — R1011 Right upper quadrant pain: Secondary | ICD-10-CM

## 2020-08-03 DIAGNOSIS — R110 Nausea: Secondary | ICD-10-CM

## 2020-08-11 ENCOUNTER — Ambulatory Visit: Payer: 59 | Admitting: Internal Medicine

## 2020-08-11 ENCOUNTER — Other Ambulatory Visit: Payer: Self-pay

## 2020-08-11 ENCOUNTER — Encounter: Payer: Self-pay | Admitting: Internal Medicine

## 2020-08-11 DIAGNOSIS — E538 Deficiency of other specified B group vitamins: Secondary | ICD-10-CM | POA: Diagnosis not present

## 2020-08-11 DIAGNOSIS — R1011 Right upper quadrant pain: Secondary | ICD-10-CM

## 2020-08-11 DIAGNOSIS — F339 Major depressive disorder, recurrent, unspecified: Secondary | ICD-10-CM | POA: Diagnosis not present

## 2020-08-11 DIAGNOSIS — K219 Gastro-esophageal reflux disease without esophagitis: Secondary | ICD-10-CM

## 2020-08-11 DIAGNOSIS — M8580 Other specified disorders of bone density and structure, unspecified site: Secondary | ICD-10-CM | POA: Diagnosis not present

## 2020-08-11 DIAGNOSIS — E559 Vitamin D deficiency, unspecified: Secondary | ICD-10-CM

## 2020-08-11 DIAGNOSIS — I10 Essential (primary) hypertension: Secondary | ICD-10-CM

## 2020-08-11 DIAGNOSIS — L02213 Cutaneous abscess of chest wall: Secondary | ICD-10-CM

## 2020-08-11 NOTE — Assessment & Plan Note (Addendum)
BP Readings from Last 3 Encounters:  08/11/20 120/84  07/28/20 136/90  05/25/20 (!) 182/100   Continue with Lopressor 50 mg twice daily

## 2020-08-11 NOTE — Assessment & Plan Note (Signed)
On B12 

## 2020-08-11 NOTE — Progress Notes (Addendum)
Subjective:  Patient ID: Sheryl Lester, female    DOB: 06-07-1956  Age: 64 y.o. MRN: [id number]  CC: Follow-up (2 week f/u)   HPI DOREEN GARRETSON presents for severe Vit D def and pain in the right upper quadrant. The pain has resolved. Follow-up on GERD and depression.  Outpatient Medications Prior to Visit  Medication Sig Dispense Refill  . B Complex-Folic Acid (B COMPLEX PLUS) TABS Take 1 tablet by mouth daily. 100 tablet 3  . buprenorphine (BUTRANS) 20 MCG/HR PTWK Place 20 mcg onto the skin every Tuesday.    . Cholecalciferol (VITAMIN D3) 1.25 MG (50000 UT) CAPS Take 1 capsule by mouth once a week. 12 capsule 0  . dibucaine (NUPERCAINAL) 1 % ointment Apply 1 application topically every morning.    . famotidine (PEPCID) 40 MG tablet Take 1 tablet (40 mg total) by mouth daily. 30 tablet 1  . levothyroxine (SYNTHROID) 50 MCG tablet TAKE 1 TABLET BY MOUTH EVERY DAY 90 tablet 3  . metoprolol tartrate (LOPRESSOR) 50 MG tablet TAKE 1 TABLET BY MOUTH TWICE A DAY 180 tablet 1  . mirtazapine (REMERON) 30 MG tablet Take 30 mg by mouth at bedtime.   5  . Misc Natural Products (TART CHERRY ADVANCED PO) Take 1 capsule by mouth 2 (two) times daily.    . mupirocin ointment (BACTROBAN) 2 % On leg wound w/dressing change  bid 30 g 0  . naloxone (NARCAN) nasal spray 4 mg/0.1 mL Place 1 spray into the nose as needed (for overdose).     . NUCYNTA 100 MG TABS Take 100 mg by mouth as needed (facial pain).     . ondansetron (ZOFRAN) 4 MG tablet Take 1 tablet (4 mg total) by mouth every 8 (eight) hours as needed for nausea or vomiting. 20 tablet 0  . TRINTELLIX 20 MG TABS tablet Take 20 mg by mouth daily.    . Vilazodone HCl (VIIBRYD) 40 MG TABS Take 40 mg by mouth every evening.     Marland Kitchen amoxicillin-clavulanate (AUGMENTIN) 875-125 MG tablet Take 1 tablet by mouth 2 (two) times daily. (Patient not taking: Reported on 08/11/2020) 14 tablet 0  . Turmeric 500 MG CAPS Take 2 capsules by mouth every morning.  (Patient not taking: Reported on 08/11/2020)     No facility-administered medications prior to visit.    ROS: Review of Systems  Constitutional: Negative for activity change, appetite change, chills, fatigue and unexpected weight change.  HENT: Negative for congestion, mouth sores and sinus pressure.   Eyes: Negative for visual disturbance.  Respiratory: Negative for cough and chest tightness.   Gastrointestinal: Negative for abdominal pain and nausea.  Genitourinary: Negative for difficulty urinating, frequency and vaginal pain.  Musculoskeletal: Negative for back pain and gait problem.  Skin: Negative for pallor and rash.  Neurological: Negative for dizziness, tremors, weakness, numbness and headaches.  Psychiatric/Behavioral: Negative for confusion, sleep disturbance and suicidal ideas.    Objective:  BP 120/84 (BP Location: Left Arm)   Pulse (!) 58   Temp 98.5 F (36.9 C) (Oral)   Wt 177 lb 6.4 oz (80.5 kg)   SpO2 97%   BMI 27.78 kg/m   BP Readings from Last 3 Encounters:  08/11/20 120/84  07/28/20 136/90  05/25/20 (!) 182/100    Wt Readings from Last 3 Encounters:  08/11/20 177 lb 6.4 oz (80.5 kg)  07/28/20 177 lb (80.3 kg)  05/25/20 172 lb (78 kg)    Physical Exam Constitutional:  General: She is not in acute distress.    Appearance: She is well-developed.  HENT:     Head: Normocephalic.     Right Ear: External ear normal.     Left Ear: External ear normal.     Nose: Nose normal.     Mouth/Throat:     Mouth: Oropharynx is clear and moist.  Eyes:     General:        Right eye: No discharge.        Left eye: No discharge.     Conjunctiva/sclera: Conjunctivae normal.     Pupils: Pupils are equal, round, and reactive to light.  Neck:     Thyroid: No thyromegaly.     Vascular: No JVD.     Trachea: No tracheal deviation.  Cardiovascular:     Rate and Rhythm: Normal rate and regular rhythm.     Heart sounds: Normal heart sounds.  Pulmonary:      Effort: No respiratory distress.     Breath sounds: No stridor. No wheezing.  Abdominal:     General: Bowel sounds are normal. There is no distension.     Palpations: Abdomen is soft. There is no mass.     Tenderness: There is no abdominal tenderness. There is no guarding or rebound.  Musculoskeletal:        General: No tenderness or edema.     Cervical back: Normal range of motion and neck supple.  Lymphadenopathy:     Cervical: No cervical adenopathy.  Skin:    Findings: No erythema or rash.  Neurological:     Cranial Nerves: No cranial nerve deficit.     Motor: No abnormal muscle tone.     Coordination: Coordination normal.     Deep Tendon Reflexes: Reflexes normal.  Psychiatric:        Mood and Affect: Mood and affect normal.        Behavior: Behavior normal.        Thought Content: Thought content normal.        Judgment: Judgment normal.     Lab Results  Component Value Date   WBC 8.1 07/28/2020   HGB 14.5 07/28/2020   HCT 43.4 07/28/2020   PLT 317.0 07/28/2020   GLUCOSE 106 (H) 07/28/2020   CHOL 202 (H) 09/22/2019   TRIG 78.0 09/22/2019   HDL 53.20 09/22/2019   LDLDIRECT 116.2 02/12/2013   LDLCALC 133 (H) 09/22/2019   ALT 48 (H) 07/28/2020   AST 26 07/28/2020   NA 131 (L) 07/28/2020   K 5.6 (H) 07/28/2020   CL 95 (L) 07/28/2020   CREATININE 1.01 07/28/2020   BUN 18 07/28/2020   CO2 28 07/28/2020   TSH 0.48 05/25/2020   INR 0.97 05/25/2018   HGBA1C 5.8 09/22/2019    US Abdomen Limited RUQ (LIVER/GB)  Result Date: 08/03/2020 CLINICAL DATA:  Right upper quadrant pain.  Elevated LFT. EXAM: ULTRASOUND ABDOMEN LIMITED RIGHT UPPER QUADRANT COMPARISON:  05/21/2018, 12/12/2015. FINDINGS: Gallbladder: Surgically absent. Common bile duct: Diameter: 3.9 mm Liver: No focal lesion identified. Within normal limits in parenchymal echogenicity. Portal vein is patent on color Doppler imaging with normal direction of blood flow towards the liver. Other: None. IMPRESSION:  Sequela of cholecystectomy. Otherwise unremarkable ultrasound. Electronically Signed   By: Primitivo Gauze M.D.   On: 08/03/2020 16:54    Assessment & Plan:   Walker Kehr, MD

## 2020-08-11 NOTE — Assessment & Plan Note (Signed)
Resolving

## 2020-08-11 NOTE — Assessment & Plan Note (Signed)
On Vit D now

## 2020-08-11 NOTE — Assessment & Plan Note (Addendum)
Very low vitamin D level. On Vit D now. On 50,000 units of vitamin D weekly

## 2020-08-11 NOTE — Assessment & Plan Note (Addendum)
No relapse. Abdominal ultrasound results reviewed.?GERD related Continue w/Pepcid 40 mg daily

## 2020-08-11 NOTE — Patient Instructions (Signed)
   B-complex with Niacin 100 mg    Lion's mane  

## 2020-08-11 NOTE — Assessment & Plan Note (Addendum)
Continue with Viibryd 40 mg daily, Trintellix 20 mg daily and Remeron 30 mg daily. Try Lion's Mane Mushroom extract or capsules for memory

## 2020-08-14 ENCOUNTER — Other Ambulatory Visit: Payer: Self-pay | Admitting: Internal Medicine

## 2020-08-14 NOTE — Assessment & Plan Note (Signed)
Continue with famotidine 40 mg daily

## 2020-08-15 ENCOUNTER — Telehealth: Payer: Self-pay | Admitting: Emergency Medicine

## 2020-08-15 DIAGNOSIS — E871 Hypo-osmolality and hyponatremia: Secondary | ICD-10-CM

## 2020-08-15 DIAGNOSIS — R519 Headache, unspecified: Secondary | ICD-10-CM

## 2020-08-15 NOTE — Telephone Encounter (Signed)
Called pt there was no answer LMOM w/MD response. Inform she can go to the [facility] for labs. Order is already in the system.Marland KitchenJohny Chess

## 2020-08-15 NOTE — Telephone Encounter (Signed)
She can come for a BMET OV with any provider if problems. Thanks

## 2020-08-15 NOTE — Telephone Encounter (Signed)
Pt called Access Nurse 08/14/2020 1:05pm. Caller states she has a headache and some chills. Caller suspects it could be low sodium since she had that a few years ago. Pt is feeling really nauseated and she states that she feels the same way she felt before her sodium was low. Pt has taken her temp and is not running a fever. Recommended ED. Pt understood and refused ED. Spoke to patient this morning and she wants to know if she should have her sodium levels tested. Please advise and give patient a call back thanks.

## 2020-08-18 ENCOUNTER — Other Ambulatory Visit (INDEPENDENT_AMBULATORY_CARE_PROVIDER_SITE_OTHER): Payer: 59

## 2020-08-18 DIAGNOSIS — R1011 Right upper quadrant pain: Secondary | ICD-10-CM | POA: Diagnosis not present

## 2020-08-18 DIAGNOSIS — E559 Vitamin D deficiency, unspecified: Secondary | ICD-10-CM

## 2020-08-18 DIAGNOSIS — R519 Headache, unspecified: Secondary | ICD-10-CM

## 2020-08-18 DIAGNOSIS — E871 Hypo-osmolality and hyponatremia: Secondary | ICD-10-CM | POA: Diagnosis not present

## 2020-08-18 LAB — BASIC METABOLIC PANEL
BUN: 15 mg/dL (ref 6–23)
CO2: 30 mEq/L (ref 19–32)
Calcium: 9.5 mg/dL (ref 8.4–10.5)
Chloride: 101 mEq/L (ref 96–112)
Creatinine, Ser: 0.83 mg/dL (ref 0.40–1.20)
GFR: 75.46 mL/min (ref >60.00)
Glucose, Bld: 93 mg/dL (ref 70–99)
Potassium: 4.4 mEq/L (ref 3.5–5.1)
Sodium: 137 mEq/L (ref 135–145)

## 2020-08-18 LAB — COMPREHENSIVE METABOLIC PANEL
ALT: 18 U/L (ref 0–35)
AST: 17 U/L (ref 0–37)
Albumin: 4.2 g/dL (ref 3.5–5.2)
Alkaline Phosphatase: 78 U/L (ref 39–117)
BUN: 15 mg/dL (ref 6–23)
CO2: 30 mEq/L (ref 19–32)
Calcium: 9.5 mg/dL (ref 8.4–10.5)
Chloride: 101 mEq/L (ref 96–112)
Creatinine, Ser: 0.83 mg/dL (ref 0.40–1.20)
GFR: 75.46 mL/min (ref >60.00)
Glucose, Bld: 93 mg/dL (ref 70–99)
Potassium: 4.4 mEq/L (ref 3.5–5.1)
Sodium: 137 mEq/L (ref 135–145)
Total Bilirubin: 0.5 mg/dL (ref 0.2–1.2)
Total Protein: 7.5 g/dL (ref 6.0–8.3)

## 2020-08-18 LAB — VITAMIN D 25 HYDROXY (VIT D DEFICIENCY, FRACTURES): VITD: 74.29 ng/mL (ref 30.00–100.00)

## 2020-08-19 ENCOUNTER — Other Ambulatory Visit: Payer: Self-pay | Admitting: Internal Medicine

## 2020-09-14 ENCOUNTER — Other Ambulatory Visit: Payer: Self-pay

## 2020-09-14 ENCOUNTER — Ambulatory Visit
Admission: RE | Admit: 2020-09-14 | Discharge: 2020-09-14 | Disposition: A | Discharge status: Home / Self Care | Payer: 59 | Source: Ambulatory Visit | Attending: Internal Medicine | Admitting: Internal Medicine

## 2020-09-14 DIAGNOSIS — Z1231 Encounter for screening mammogram for malignant neoplasm of breast: Secondary | ICD-10-CM

## 2020-09-19 ENCOUNTER — Telehealth: Payer: Self-pay | Admitting: Internal Medicine

## 2020-09-19 NOTE — Telephone Encounter (Signed)
[facility] FYI  Caller states she added supplements to her medicine and now has facial swelling with facial tingling. She says her buttocks is burning HOT.Caller states she added Lion's mane, B3, and Niacin at 0700.  [facility] advised: Call EMS 911 now  Patient understood and went to the ED via EMS

## 2020-09-28 ENCOUNTER — Ambulatory Visit: Payer: 59 | Admitting: Internal Medicine

## 2020-10-04 ENCOUNTER — Other Ambulatory Visit: Payer: Self-pay

## 2020-10-06 ENCOUNTER — Other Ambulatory Visit: Payer: Self-pay

## 2020-10-06 ENCOUNTER — Encounter: Payer: Self-pay | Admitting: Internal Medicine

## 2020-10-06 ENCOUNTER — Ambulatory Visit: Payer: 59 | Admitting: Internal Medicine

## 2020-10-06 VITALS — BP 130/84 | HR 64 | Temp 97.8°F

## 2020-10-06 DIAGNOSIS — E538 Deficiency of other specified B group vitamins: Secondary | ICD-10-CM

## 2020-10-06 DIAGNOSIS — G500 Trigeminal neuralgia: Secondary | ICD-10-CM

## 2020-10-06 DIAGNOSIS — C4359 Malignant melanoma of other part of trunk: Secondary | ICD-10-CM

## 2020-10-06 DIAGNOSIS — D485 Neoplasm of uncertain behavior of skin: Secondary | ICD-10-CM | POA: Diagnosis not present

## 2020-10-06 DIAGNOSIS — I10 Essential (primary) hypertension: Secondary | ICD-10-CM | POA: Diagnosis not present

## 2020-10-06 DIAGNOSIS — E559 Vitamin D deficiency, unspecified: Secondary | ICD-10-CM | POA: Diagnosis not present

## 2020-10-06 LAB — COMPREHENSIVE METABOLIC PANEL
ALT: 22 U/L (ref 0–35)
AST: 22 U/L (ref 0–37)
Albumin: 4.1 g/dL (ref 3.5–5.2)
Alkaline Phosphatase: 106 U/L (ref 39–117)
BUN: 16 mg/dL (ref 6–23)
CO2: 26 mEq/L (ref 19–32)
Calcium: 9.3 mg/dL (ref 8.4–10.5)
Chloride: 105 mEq/L (ref 96–112)
Creatinine, Ser: 0.76 mg/dL (ref 0.40–1.20)
GFR: 83.8 mL/min (ref >60.00)
Glucose, Bld: 95 mg/dL (ref 70–99)
Potassium: 4.4 mEq/L (ref 3.5–5.1)
Sodium: 140 mEq/L (ref 135–145)
Total Bilirubin: 0.2 mg/dL (ref 0.2–1.2)
Total Protein: 7.1 g/dL (ref 6.0–8.3)

## 2020-10-06 LAB — VITAMIN D 25 HYDROXY (VIT D DEFICIENCY, FRACTURES): VITD: 52.32 ng/mL (ref 30.00–100.00)

## 2020-10-06 MED ORDER — TRIAMCINOLONE ACETONIDE 0.5 % EX OINT: 1.0000 "application " | TOPICAL_OINTMENT | Freq: Two times a day (BID) | CUTANEOUS | 1 refills | Status: AC

## 2020-10-06 NOTE — Assessment & Plan Note (Signed)
Chronic On pain regimen  Potential benefits of a long term opioids use as well as potential risks (i.e. addiction risk, apnea etc) and complications (i.e. Somnolence, constipation and others) were explained to the patient and were aknowledged.

## 2020-10-06 NOTE — Assessment & Plan Note (Signed)
R thigh papule and excoriations Triamc oint and bandaid Derm ref

## 2020-10-06 NOTE — Progress Notes (Signed)
Subjective:  Patient ID: Sheryl Lester, female    DOB: 1956/07/05  Age: 65 y.o. MRN: [id number]  CC: Follow-up (Want to discuss the supplements that MD recommended B-Complex & [facility]. Also want referral to see Dermatologist. She states she has some spots that she is concern about )   HPI [name] presents for skin lesions,  Outpatient Medications Prior to Visit  Medication Sig Dispense Refill  . buprenorphine (BUTRANS) 20 MCG/HR PTWK Place 20 mcg onto the skin every Tuesday.    . Cholecalciferol (VITAMIN D3) 1.25 MG (50000 UT) CAPS TAKE 1 CAPSULE BY MOUTH ONE TIME PER WEEK 12 capsule 0  . dibucaine (NUPERCAINAL) 1 % ointment Apply 1 application topically every morning.    . famotidine (PEPCID) 40 MG tablet TAKE 1 TABLET BY MOUTH EVERY DAY 90 tablet 3  . levothyroxine (SYNTHROID) 50 MCG tablet TAKE 1 TABLET BY MOUTH EVERY DAY 90 tablet 3  . metoprolol tartrate (LOPRESSOR) 50 MG tablet TAKE 1 TABLET BY MOUTH TWICE A DAY 180 tablet 1  . mirtazapine (REMERON) 30 MG tablet Take 30 mg by mouth at bedtime.   5  . Misc Natural Products (TART CHERRY ADVANCED PO) Take 1 capsule by mouth 2 (two) times daily.    . mupirocin ointment (BACTROBAN) 2 % On leg wound w/dressing change  bid 30 g 0  . naloxone (NARCAN) nasal spray 4 mg/0.1 mL Place 1 spray into the nose as needed (for overdose).     . NUCYNTA 100 MG TABS Take 100 mg by mouth as needed (facial pain).     . ondansetron (ZOFRAN) 4 MG tablet Take 1 tablet (4 mg total) by mouth every 8 (eight) hours as needed for nausea or vomiting. 20 tablet 0  . TRINTELLIX 20 MG TABS tablet Take 20 mg by mouth daily.    . Vilazodone HCl (VIIBRYD) 40 MG TABS Take 40 mg by mouth every evening.     . B Complex-Folic Acid (B COMPLEX PLUS) TABS Take 1 tablet by mouth daily. (Patient not taking: Reported on 10/06/2020) 100 tablet 3   No facility-administered medications prior to visit.    ROS: Review of Systems  Constitutional: Negative for  activity change, appetite change, chills, fatigue and unexpected weight change.  HENT: Negative for congestion, mouth sores and sinus pressure.   Eyes: Negative for visual disturbance.  Respiratory: Negative for cough and chest tightness.   Gastrointestinal: Negative for abdominal pain and nausea.  Genitourinary: Negative for difficulty urinating, frequency and vaginal pain.  Musculoskeletal: Negative for back pain and gait problem.  Skin: Negative for pallor and rash.  Neurological: Negative for dizziness, tremors, weakness, numbness and headaches.  Psychiatric/Behavioral: Negative for confusion, sleep disturbance and suicidal ideas.    Objective:  BP 130/84 (BP Location: Left Arm)   Pulse 64   Temp 97.8 F (36.6 C) (Oral)   SpO2 95%   BP Readings from Last 3 Encounters:  10/06/20 130/84  08/11/20 120/84  07/28/20 136/90    Wt Readings from Last 3 Encounters:  08/11/20 177 lb 6.4 oz (80.5 kg)  07/28/20 177 lb (80.3 kg)  05/25/20 172 lb (78 kg)    Physical Exam Constitutional:      General: She is not in acute distress.    Appearance: She is well-developed.  HENT:     Head: Normocephalic.     Right Ear: External ear normal.     Left Ear: External ear normal.     Nose: Nose  normal.     Mouth/Throat:     Mouth: Oropharynx is clear and moist.  Eyes:     General:        Right eye: No discharge.        Left eye: No discharge.     Conjunctiva/sclera: Conjunctivae normal.     Pupils: Pupils are equal, round, and reactive to light.  Neck:     Thyroid: No thyromegaly.     Vascular: No JVD.     Trachea: No tracheal deviation.  Cardiovascular:     Rate and Rhythm: Normal rate and regular rhythm.     Heart sounds: Normal heart sounds.  Pulmonary:     Effort: No respiratory distress.     Breath sounds: No stridor. No wheezing.  Abdominal:     General: Bowel sounds are normal. There is no distension.     Palpations: Abdomen is soft. There is no mass.     Tenderness:  There is no abdominal tenderness. There is no guarding or rebound.  Musculoskeletal:        General: No tenderness or edema.     Cervical back: Normal range of motion and neck supple.  Lymphadenopathy:     Cervical: No cervical adenopathy.  Skin:    Findings: No erythema or rash.  Neurological:     Mental Status: She is oriented to person, place, and time.     Cranial Nerves: No cranial nerve deficit.     Motor: No abnormal muscle tone.     Coordination: Coordination normal.     Deep Tendon Reflexes: Reflexes normal.  Psychiatric:        Mood and Affect: Mood and affect normal.        Behavior: Behavior normal.        Thought Content: Thought content normal.        Judgment: Judgment normal.    R thigh papule and excoriations  Lab Results  Component Value Date   WBC 8.1 07/28/2020   HGB 14.5 07/28/2020   HCT 43.4 07/28/2020   PLT 317.0 07/28/2020   GLUCOSE 93 08/18/2020   GLUCOSE 93 08/18/2020   CHOL 202 (H) 09/22/2019   TRIG 78.0 09/22/2019   HDL 53.20 09/22/2019   LDLDIRECT 116.2 02/12/2013   LDLCALC 133 (H) 09/22/2019   ALT 18 08/18/2020   AST 17 08/18/2020   NA 137 08/18/2020   NA 137 08/18/2020   K 4.4 08/18/2020   K 4.4 08/18/2020   CL 101 08/18/2020   CL 101 08/18/2020   CREATININE 0.83 08/18/2020   CREATININE 0.83 08/18/2020   BUN 15 08/18/2020   BUN 15 08/18/2020   CO2 30 08/18/2020   CO2 30 08/18/2020   TSH 0.48 05/25/2020   INR 0.97 05/25/2018   HGBA1C 5.8 09/22/2019    MM 3D SCREEN BREAST BILATERAL  Result Date: 09/14/2020 CLINICAL DATA:  Screening. EXAM: DIGITAL SCREENING BILATERAL MAMMOGRAM WITH TOMO AND CAD COMPARISON:  Previous exam(s). ACR Breast Density Category b: There are scattered areas of fibroglandular density. FINDINGS: There are no findings suspicious for malignancy. Images were processed with CAD. IMPRESSION: No mammographic evidence of malignancy. A result letter of this screening mammogram will be mailed directly to the patient.  RECOMMENDATION: Screening mammogram in one year. (Code:SM-B-01Y) BI-RADS CATEGORY  1: Negative. Electronically Signed   By: Abelardo Diesel M.D.   On: 09/14/2020 09:12    Assessment & Plan:   \ Walker Kehr, MD

## 2020-10-06 NOTE — Assessment & Plan Note (Signed)
On Vit D now. On 50,000 units of vitamin D weekly

## 2020-10-06 NOTE — Assessment & Plan Note (Addendum)
Risks associated with treatment noncompliance were discussed. Compliance was encouraged. On SL B12

## 2020-10-06 NOTE — Assessment & Plan Note (Signed)
Derm ref

## 2020-10-09 ENCOUNTER — Encounter: Payer: Self-pay | Admitting: Internal Medicine

## 2020-10-18 ENCOUNTER — Other Ambulatory Visit: Payer: 59

## 2020-10-18 DIAGNOSIS — Z20822 Contact with and (suspected) exposure to covid-19: Secondary | ICD-10-CM

## 2020-10-19 LAB — SARS-COV-2, NAA 2 DAY TAT

## 2020-10-19 LAB — NOVEL CORONAVIRUS, NAA: SARS-CoV-2, NAA: NOT DETECTED

## 2020-10-31 ENCOUNTER — Other Ambulatory Visit: Payer: Self-pay | Admitting: Internal Medicine

## 2020-11-16 ENCOUNTER — Ambulatory Visit: Payer: 59 | Admitting: Internal Medicine

## 2020-11-24 ENCOUNTER — Other Ambulatory Visit: Payer: Self-pay | Admitting: Internal Medicine

## 2020-12-05 ENCOUNTER — Other Ambulatory Visit: Payer: Self-pay

## 2020-12-05 ENCOUNTER — Telehealth: Payer: Self-pay | Admitting: Family Medicine

## 2020-12-05 DIAGNOSIS — G8929 Other chronic pain: Secondary | ICD-10-CM

## 2020-12-05 NOTE — Telephone Encounter (Signed)
Pt called, requesting we refer her to Ortho as she would like to get their opinion on her R ankle. She would prefer NOT to go to [facility] unless Dr. Tamala Julian thinks it is necessary. Please advise her when/where referral placed.

## 2020-12-05 NOTE — Telephone Encounter (Signed)
Order sent. Patient notified via mychart.

## 2020-12-08 ENCOUNTER — Telehealth: Payer: Self-pay | Admitting: Internal Medicine

## 2020-12-08 NOTE — Telephone Encounter (Signed)
[facility] FYI   Caller states is having dizziness and extreme nausea. She states this has happened before and her sodium level was abnormally low. She is also SOB and has no reason to be. States symptoms started around 5pm today.  Advised to go to ED now. Caller denies the need to go in anywhere tonight, states she just wants a sodium level ordered for in the morning. RN advised that isn't something that can be done after hours. Caller verbalized understanding.

## 2020-12-21 ENCOUNTER — Encounter: Payer: Self-pay | Admitting: Neurology

## 2020-12-22 ENCOUNTER — Other Ambulatory Visit: Payer: Self-pay | Admitting: Orthopaedic Surgery

## 2020-12-22 DIAGNOSIS — M25571 Pain in right ankle and joints of right foot: Secondary | ICD-10-CM

## 2020-12-30 ENCOUNTER — Ambulatory Visit
Admission: RE | Admit: 2020-12-30 | Discharge: 2020-12-30 | Disposition: A | Discharge status: Home / Self Care | Payer: 59 | Source: Ambulatory Visit | Attending: Orthopaedic Surgery | Admitting: Orthopaedic Surgery

## 2020-12-30 ENCOUNTER — Other Ambulatory Visit: Payer: Self-pay

## 2020-12-30 DIAGNOSIS — M25571 Pain in right ankle and joints of right foot: Secondary | ICD-10-CM

## 2021-01-10 ENCOUNTER — Other Ambulatory Visit: Payer: Self-pay | Admitting: *Deleted

## 2021-01-10 DIAGNOSIS — R202 Paresthesia of skin: Secondary | ICD-10-CM

## 2021-01-11 ENCOUNTER — Other Ambulatory Visit: Payer: Self-pay

## 2021-01-11 ENCOUNTER — Ambulatory Visit (INDEPENDENT_AMBULATORY_CARE_PROVIDER_SITE_OTHER): Payer: 59 | Admitting: Neurology

## 2021-01-11 DIAGNOSIS — R202 Paresthesia of skin: Secondary | ICD-10-CM

## 2021-01-11 NOTE — Procedures (Signed)
[facility]  [address]  [place], [place] [address] Tel: [phone] Fax:  [phone] Test Date:  01/11/2021  Patient: Sheryl Lester DOB: 25-Feb-1956 Physician: Narda Amber, DO  Sex: Female Height: 5\' 7"  Ref Phys: Melony Overly, MD  ID#: [id number]   Technician:    Patient Complaints: This is a 65 year old female referred for evaluation of right foot pain.  NCV & EMG Findings: Extensive electrodiagnostic testing of the right lower extremity shows:  1. Right sural and superficial peroneal sensory responses are within normal limits. 2. Right peroneal and tibial motor responses are within normal limits. 3. Right tibial H reflex study is within normal limits. 4. There is no evidence of active or chronic motor axonal loss changes affecting any of the tested muscles.  Motor unit configuration and recruitment pattern is within normal limits.  Impression: This is a normal study of the right lower extremity.  In particular, there is no evidence of a sensorimotor polyneuropathy or lumbosacral radiculopathy.   ___________________________ Narda Amber, DO    Nerve Conduction Studies Anti Sensory Summary Table   Stim Site NR Peak (ms) Norm Peak (ms) P-T Amp (V) Norm P-T Amp  Right Sup Peroneal Anti Sensory (Ant Lat Mall)  32C  12 cm    2.4 <4.6 8.7 >3  Right Sural Anti Sensory (Lat Mall)  32C  Calf    2.2 <4.6 13.3 >3   Motor Summary Table   Stim Site NR Onset (ms) Norm Onset (ms) O-P Amp (mV) Norm O-P Amp Site1 Site2 Delta-0 (ms) Dist (cm) Vel (m/s) Norm Vel (m/s)  Right Peroneal Motor (Ext Dig Brev)  32C  Ankle    2.6 <6.0 4.8 >2.5 B Fib Ankle 6.9 34.0 49 >40  B Fib    9.5  4.5  Poplt B Fib 1.6 8.0 50 >40  Poplt    11.1  4.5         Right Tibial Motor (Abd Hall Brev)  32C  Ankle    2.6 <6.0 16.6 >4 Knee Ankle 7.7 42.0 55 >40  Knee    10.3  11.4          H Reflex Studies   NR H-Lat (ms) Lat Norm (ms) L-R H-Lat (ms)  Right Tibial  (Gastroc)  32C     30.48 <35    EMG   Side Muscle Ins Act Fibs Psw Fasc Number Recrt Dur Dur. Amp Amp. Poly Poly. Comment  Right AntTibialis Nml Nml Nml Nml Nml Nml Nml Nml Nml Nml Nml Nml N/A  Right Gastroc Nml Nml Nml Nml Nml Nml Nml Nml Nml Nml Nml Nml N/A  Right Flex Dig Long Nml Nml Nml Nml Nml Nml Nml Nml Nml Nml Nml Nml N/A  Right RectFemoris Nml Nml Nml Nml Nml Nml Nml Nml Nml Nml Nml Nml N/A  Right GluteusMed Nml Nml Nml Nml Nml Nml Nml Nml Nml Nml Nml Nml N/A  Right BicepsFemS Nml Nml Nml Nml Nml Nml Nml Nml Nml Nml Nml Nml N/A      Waveforms:

## 2021-02-06 ENCOUNTER — Other Ambulatory Visit: Payer: Self-pay

## 2021-02-06 ENCOUNTER — Ambulatory Visit (INDEPENDENT_AMBULATORY_CARE_PROVIDER_SITE_OTHER): Payer: 59 | Admitting: Internal Medicine

## 2021-02-06 ENCOUNTER — Encounter: Payer: Self-pay | Admitting: Internal Medicine

## 2021-02-06 DIAGNOSIS — E538 Deficiency of other specified B group vitamins: Secondary | ICD-10-CM

## 2021-02-06 DIAGNOSIS — E871 Hypo-osmolality and hyponatremia: Secondary | ICD-10-CM

## 2021-02-06 DIAGNOSIS — E559 Vitamin D deficiency, unspecified: Secondary | ICD-10-CM

## 2021-02-06 DIAGNOSIS — I10 Essential (primary) hypertension: Secondary | ICD-10-CM | POA: Diagnosis not present

## 2021-02-06 DIAGNOSIS — Z6830 Body mass index (BMI) 30.0-30.9, adult: Secondary | ICD-10-CM

## 2021-02-06 DIAGNOSIS — E6609 Other obesity due to excess calories: Secondary | ICD-10-CM

## 2021-02-06 DIAGNOSIS — E669 Obesity, unspecified: Secondary | ICD-10-CM | POA: Insufficient documentation

## 2021-02-06 NOTE — Assessment & Plan Note (Addendum)
Continue with Lopressor 50 mg twice daily Diet discussed

## 2021-02-06 NOTE — Assessment & Plan Note (Signed)
Check CMET. 

## 2021-02-06 NOTE — Assessment & Plan Note (Addendum)
BMI  Discussed Diet - low carb discussed Exercise

## 2021-02-06 NOTE — Assessment & Plan Note (Signed)
Risks associated with treatment noncompliance were discussed. Compliance was encouraged. 12/21 Very low vitamin D level. On Vit D now. On 50,000 units of vitamin D weekly

## 2021-02-06 NOTE — Progress Notes (Signed)
Subjective:  Patient ID: Sheryl Lester, female    DOB: Jan 23, 1956  Age: 65 y.o. MRN: [id number]  CC: Follow-up (4 month f/u)   HPI Sheryl Lester presents for being overweight - trying to loose wt F/u on low sodium, HTN  Outpatient Medications Prior to Visit  Medication Sig Dispense Refill   buprenorphine (BUTRANS) 20 MCG/HR PTWK Place 20 mcg onto the skin every Tuesday.     dibucaine (NUPERCAINAL) 1 % ointment Apply 1 application topically every morning.     famotidine (PEPCID) 40 MG tablet TAKE 1 TABLET BY MOUTH EVERY DAY 90 tablet 3   levothyroxine (SYNTHROID) 50 MCG tablet TAKE 1 TABLET BY MOUTH EVERY DAY 90 tablet 3   metoprolol tartrate (LOPRESSOR) 50 MG tablet TAKE 1 TABLET BY MOUTH TWICE A DAY 180 tablet 1   mirtazapine (REMERON) 30 MG tablet Take 30 mg by mouth at bedtime.   5   Misc Natural Products (TART CHERRY ADVANCED PO) Take 1 capsule by mouth 2 (two) times daily.     NUCYNTA 100 MG TABS Take 100 mg by mouth as needed (facial pain).      triamcinolone ointment (KENALOG) 0.5 % Apply 1 application topically 2 (two) times daily. 60 g 1   TRINTELLIX 20 MG TABS tablet Take 20 mg by mouth daily.     Vilazodone HCl (VIIBRYD) 40 MG TABS Take 40 mg by mouth every evening.      naloxone (NARCAN) nasal spray 4 mg/0.1 mL Place 1 spray into the nose as needed (for overdose).  (Patient not taking: Reported on 02/06/2021)     Cholecalciferol (VITAMIN D3) 1.25 MG (50000 UT) CAPS TAKE 1 CAPSULE BY MOUTH ONE TIME PER WEEK (Patient not taking: Reported on 02/06/2021) 12 capsule 0   mupirocin ointment (BACTROBAN) 2 % On leg wound w/dressing change  bid (Patient not taking: Reported on 02/06/2021) 30 g 0   ondansetron (ZOFRAN) 4 MG tablet Take 1 tablet (4 mg total) by mouth every 8 (eight) hours as needed for nausea or vomiting. (Patient not taking: Reported on 02/06/2021) 20 tablet 0   No facility-administered medications prior to visit.    ROS: Review of Systems  Constitutional:   Negative for activity change, appetite change, chills, fatigue and unexpected weight change.  HENT:  Negative for congestion, mouth sores and sinus pressure.   Eyes:  Negative for visual disturbance.  Respiratory:  Negative for cough and chest tightness.   Gastrointestinal:  Negative for abdominal pain and nausea.  Genitourinary:  Negative for difficulty urinating, frequency and vaginal pain.  Musculoskeletal:  Negative for back pain and gait problem.  Skin:  Negative for pallor and rash.  Neurological:  Negative for dizziness, tremors, weakness, numbness and headaches.  Psychiatric/Behavioral:  Negative for confusion, sleep disturbance and suicidal ideas. The patient is nervous/anxious.    Objective:  BP (!) 142/90 (BP Location: Left Arm)   Pulse 60   Temp 98.6 F (37 C) (Oral)   Ht 5\' 7"  (1.702 m)   Wt 177 lb 3.2 oz (80.4 kg)   SpO2 95%   BMI 27.75 kg/m   BP Readings from Last 3 Encounters:  02/06/21 (!) 142/90  10/06/20 130/84  08/11/20 120/84    Wt Readings from Last 3 Encounters:  02/06/21 177 lb 3.2 oz (80.4 kg)  08/11/20 177 lb 6.4 oz (80.5 kg)  07/28/20 177 lb (80.3 kg)    Physical Exam Constitutional:      General: She is not in acute  distress.    Appearance: She is well-developed.  HENT:     Head: Normocephalic.     Right Ear: External ear normal.     Left Ear: External ear normal.     Nose: Nose normal.  Eyes:     General:        Right eye: No discharge.        Left eye: No discharge.     Conjunctiva/sclera: Conjunctivae normal.     Pupils: Pupils are equal, round, and reactive to light.  Neck:     Thyroid: No thyromegaly.     Vascular: No JVD.     Trachea: No tracheal deviation.  Cardiovascular:     Rate and Rhythm: Normal rate and regular rhythm.     Heart sounds: Normal heart sounds.  Pulmonary:     Effort: No respiratory distress.     Breath sounds: No stridor. No wheezing.  Abdominal:     General: Bowel sounds are normal. There is no  distension.     Palpations: Abdomen is soft. There is no mass.     Tenderness: There is no abdominal tenderness. There is no guarding or rebound.  Musculoskeletal:        General: Tenderness present.     Cervical back: Normal range of motion and neck supple. No rigidity.  Lymphadenopathy:     Cervical: No cervical adenopathy.  Skin:    Findings: No erythema or rash.  Neurological:     Cranial Nerves: No cranial nerve deficit.     Motor: No abnormal muscle tone.     Coordination: Coordination normal.     Deep Tendon Reflexes: Reflexes normal.  Psychiatric:        Behavior: Behavior normal.        Thought Content: Thought content normal.        Judgment: Judgment normal.    Lab Results  Component Value Date   WBC 8.1 07/28/2020   HGB 14.5 07/28/2020   HCT 43.4 07/28/2020   PLT 317.0 07/28/2020   GLUCOSE 95 10/06/2020   CHOL 202 (H) 09/22/2019   TRIG 78.0 09/22/2019   HDL 53.20 09/22/2019   LDLDIRECT 116.2 02/12/2013   LDLCALC 133 (H) 09/22/2019   ALT 22 10/06/2020   AST 22 10/06/2020   NA 140 10/06/2020   K 4.4 10/06/2020   CL 105 10/06/2020   CREATININE 0.76 10/06/2020   BUN 16 10/06/2020   CO2 26 10/06/2020   TSH 0.48 05/25/2020   INR 0.97 05/25/2018   HGBA1C 5.8 09/22/2019    MR ANKLE RIGHT WO CONTRAST  Result Date: 12/31/2020 CLINICAL DATA:  Right ankle pain status post fall down the stairs 5 years ago EXAM: MRI OF THE RIGHT ANKLE WITHOUT CONTRAST TECHNIQUE: Multiplanar, multisequence MR imaging of the ankle was performed. No intravenous contrast was administered. COMPARISON:  None. FINDINGS: TENDONS Peroneal: Peroneal longus tendon intact. Peroneal brevis intact. Posteromedial: Posterior tibial tendon is intact. Flexor hallucis longus tendon intact. Flexor digitorum longus tendon intact. Anterior: Tibialis anterior tendon intact. Extensor hallucis longus tendon intact Extensor digitorum longus tendon intact. Achilles:  Intact. Plantar Fascia: Intact. LIGAMENTS  Lateral: Ossification along the anterior talofibular ligament. Calcaneofibular ligament intact. Posterior talofibular ligament intact. Anterior and posterior tibiofibular ligaments intact. Medial: Deltoid ligament intact. Spring ligament intact. CARTILAGE Ankle Joint: No joint effusion. Normal ankle mortise. No chondral defect. Subtalar Joints/Sinus Tarsi: Normal sinus tarsi. 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 Bones:  No marrow signal abnormality. No fracture or dislocation. Subcortical reactive marrow changes in the distal fibula and lateral talus. Soft Tissue: No fluid collection or hematoma. Muscles are normal without edema or atrophy. Tarsal tunnel is normal. IMPRESSION: 1. 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 2. Subcortical reactive marrow changes in the distal fibula and lateral talus. Electronically Signed   By: Kathreen Devoid   On: 12/31/2020 07:13    Assessment & Plan:     Walker Kehr, MD

## 2021-02-06 NOTE — Assessment & Plan Note (Signed)
On B12 

## 2021-02-06 NOTE — Patient Instructions (Signed)
The Obesity Code book by Sharman Cheek    These suggestions will probably help you to improve your metabolism if you are not overweight and to lose weight if you are overweight: 1.  Reduce your consumption of sugars and starches.  Eliminate high fructose corn syrup from your diet.  Reduce your consumption of processed foods.  For desserts try to have seasonal fruits, berries, nuts, cheeses or dark chocolate with more than 70% cacao. 2.  Do not snack 3.  You do not have to eat breakfast.  If you choose to have breakfast - eat plain greek yogurt, eggs, oatmeal (without sugar) - use honey if you need to. 4.  Drink water, freshly brewed unsweetened tea (green, black or herbal) or coffee.  Do not drink sodas including diet sodas , juices, beverages sweetened with artificial sweeteners. 5.  Reduce your consumption of refined grains. 6.  Avoid protein drinks such as Optifast, Slim fast etc. Eat chicken, fish, meat, dairy and beans for your sources of protein. 7.  Natural unprocessed fats like cold pressed virgin olive oil, butter, coconut oil are good for you.  Eat avocados. 8.  Increase your consumption of fiber.  Fruits, berries, vegetables, whole grains, flaxseed, chia seeds, beans, popcorn, nuts, oatmeal are good sources of fiber 9.  Use vinegar in your diet, i.e. apple cider vinegar, red wine or balsamic vinegar 10.  You can try fasting.  For example you can skip breakfast and lunch every other day (24-hour fast) 11.  Stress reduction, good night sleep, relaxation, meditation, yoga and other physical activity is likely to help you to maintain low weight too. 12.  If you drink alcohol, limit your alcohol intake to no more than 2 drinks a day. Health benefits of a Mediterranean diet: A traditional Mediterranean diet consisting of large quantities of fresh fruits and vegetables, nuts, fish and olive oil--coupled with physical activity--can reduce your risk of serious mental and physical health problems  by: Preventing heart disease and strokes. Following a Mediterranean diet limits your intake of refined breads, processed foods, and red meat, and encourages drinking red wine instead of hard liquor--all factors that can help prevent heart disease and stroke. Keeping you agile. If you're an older adult, the nutrients gained with a Mediterranean diet may reduce your risk of developing muscle weakness and other signs of frailty by about 70 percent. Reducing the risk of Alzheimer's. Research suggests that the [place] diet may improve cholesterol, blood sugar levels, and overall blood vessel health, which in turn may reduce your risk of Alzheimer's disease or dementia. Halving the risk of Parkinson's disease. The high levels of antioxidants in the Mediterranean diet can prevent cells from undergoing a damaging process called oxidative stress, thereby cutting the risk of Parkinson's disease in half. Increasing longevity. By reducing your risk of developing heart disease or cancer with the Mediterranean diet, you're reducing your risk of death at any age by 20%. Protecting against type 2 diabetes. A Mediterranean diet is rich in fiber which digests slowly, prevents huge swings in blood sugar, and can help you maintain a healthy weight.   Cabbage soup recipe that will not make you gain weight: Take 1 small head of cabbage, 1 average pack of celery, 4 green peppers, 4 onions, 2 cans diced tomatoes (they are not available without salt), salt and spices to taste.  Chop cabbage, celery, peppers and onions.  And tomatoes and 2-2.5 liters (2.5 quarts) of water so that it would just cover the vegetables.  Bring to boil.  Add spices and salt.  Turn heat to low/medium and simmer for 20-25 minutes.  Naturally, you can make a smaller batch and change some of the ingredients.  Sign up for [employer] ( via [employer] on your phone or your ipad). If you don't have a [employer] card  - go to [employer] branch. They  will set you up in 15 minutes. It is free. You can check out books to read and to listen, check out magazines and newspapers, movies etc.

## 2021-02-14 ENCOUNTER — Encounter: Payer: 59 | Admitting: Neurology

## 2021-02-17 ENCOUNTER — Ambulatory Visit: Payer: 59 | Attending: Internal Medicine

## 2021-02-17 DIAGNOSIS — Z20822 Contact with and (suspected) exposure to covid-19: Secondary | ICD-10-CM

## 2021-02-18 ENCOUNTER — Telehealth: Payer: Self-pay

## 2021-02-18 LAB — SARS-COV-2, NAA 2 DAY TAT

## 2021-02-18 LAB — NOVEL CORONAVIRUS, NAA: SARS-CoV-2, NAA: NOT DETECTED

## 2021-02-18 NOTE — Telephone Encounter (Signed)
Pt's husband called for covid test results- advised results are not back.

## 2021-02-21 ENCOUNTER — Telehealth: Payer: Self-pay | Admitting: Internal Medicine

## 2021-02-21 NOTE — Telephone Encounter (Signed)
Patient tested positive yesterday with a at home test. Stated her husband had tested positive over the weekend and that's when her symptoms started. Having sore throat, congestion, severe headache, and fever but the fever has went away. She would like to discuss whether she would be a good candidate for the infusion    Please advise.

## 2021-02-22 ENCOUNTER — Other Ambulatory Visit: Payer: Self-pay

## 2021-02-22 ENCOUNTER — Telehealth (INDEPENDENT_AMBULATORY_CARE_PROVIDER_SITE_OTHER): Payer: 59 | Admitting: Internal Medicine

## 2021-02-22 ENCOUNTER — Encounter: Payer: Self-pay | Admitting: Internal Medicine

## 2021-02-22 DIAGNOSIS — U071 COVID-19: Secondary | ICD-10-CM | POA: Diagnosis not present

## 2021-02-22 MED ORDER — PAXLOVID 20 X 150 MG & 10 X 100MG PO TBPK
3.0000 | ORAL_TABLET | Freq: Two times a day (BID) | ORAL | 0 refills | Status: DC
Start: 2021-02-22 — End: 2021-11-03

## 2021-02-22 NOTE — Progress Notes (Signed)
Virtual Visit via Video Note  I connected with Sheryl Lester on 02/22/21 at  4:00 PM EDT by a video enabled telemedicine application and verified that I am speaking with the correct person using two identifiers.   I discussed the limitations of evaluation and management by telemedicine and the availability of in person appointments. The patient expressed understanding and agreed to proceed.  I was located at our [facility] office. The patient was at home. There was no one else present in the visit.   History of Present Illness: Sheryl Lester is complaining of being sick since Saturday with fever of 101.5 for 3 days, extreme weakness, dry cough, sore throat, arthralgias.  Her fever is better now.  No shortness of breath.  She tested positive for COVID on Saturday.  Her husband is sick to   Observations/Objective: The patient appears to be in no acute distress, looks tired  Assessment and Plan:  See my Assessment and Plan. Follow Up Instructions:    I discussed the assessment and treatment plan with the patient. The patient was provided an opportunity to ask questions and all were answered. The patient agreed with the plan and demonstrated an understanding of the instructions.   The patient was advised to call back or seek an in-person evaluation if the symptoms worsen or if the condition fails to improve as anticipated.  I provided face-to-face time during this encounter. We were at different locations.   Walker Kehr, MD

## 2021-02-22 NOTE — Telephone Encounter (Signed)
Pt scheduled for virtual visit with PCP for today at 4p.  Pt notified & verb understanding.

## 2021-02-22 NOTE — Assessment & Plan Note (Signed)
Moderate case with several chronic illnesses.  Paxlovid prescribed.  Good hydration.  OTC cold meds.  Call if problems.  Isolation requirements discussed.

## 2021-02-22 NOTE — Telephone Encounter (Signed)
[name] may be a candidate for Paxlovid. Please schedule a virtual office visit with any provider today. Thank you,

## 2021-05-05 ENCOUNTER — Other Ambulatory Visit: Payer: Self-pay | Admitting: Internal Medicine

## 2021-05-07 ENCOUNTER — Emergency Department (HOSPITAL_COMMUNITY)
Admission: EM | Admit: 2021-05-07 | Discharge: 2021-05-08 | Disposition: A | Discharge status: Home / Self Care | Payer: 59 | Attending: Physician Assistant | Admitting: Physician Assistant

## 2021-05-07 ENCOUNTER — Encounter (HOSPITAL_COMMUNITY): Payer: Self-pay | Admitting: Emergency Medicine

## 2021-05-07 ENCOUNTER — Other Ambulatory Visit: Payer: Self-pay

## 2021-05-07 ENCOUNTER — Encounter (HOSPITAL_COMMUNITY): Payer: Self-pay | Admitting: *Deleted

## 2021-05-07 ENCOUNTER — Ambulatory Visit (HOSPITAL_COMMUNITY)
Admission: EM | Admit: 2021-05-07 | Discharge: 2021-05-07 | Disposition: A | Discharge status: Home / Self Care | Payer: 59 | Attending: Emergency Medicine | Admitting: Emergency Medicine

## 2021-05-07 DIAGNOSIS — L03211 Cellulitis of face: Secondary | ICD-10-CM

## 2021-05-07 DIAGNOSIS — G500 Trigeminal neuralgia: Secondary | ICD-10-CM

## 2021-05-07 DIAGNOSIS — L0103 Bullous impetigo: Secondary | ICD-10-CM

## 2021-05-07 DIAGNOSIS — Z5321 Procedure and treatment not carried out due to patient leaving prior to being seen by health care provider: Secondary | ICD-10-CM | POA: Diagnosis not present

## 2021-05-07 DIAGNOSIS — R21 Rash and other nonspecific skin eruption: Secondary | ICD-10-CM | POA: Insufficient documentation

## 2021-05-07 DIAGNOSIS — I10 Essential (primary) hypertension: Secondary | ICD-10-CM | POA: Diagnosis not present

## 2021-05-07 DIAGNOSIS — R110 Nausea: Secondary | ICD-10-CM

## 2021-05-07 LAB — LACTIC ACID, PLASMA: Lactic Acid, Venous: 1.9 mmol/L (ref 0.5–1.9)

## 2021-05-07 LAB — COMPREHENSIVE METABOLIC PANEL
ALT: 18 U/L (ref 0–44)
AST: 22 U/L (ref 15–41)
Albumin: 4.1 g/dL (ref 3.5–5.0)
Alkaline Phosphatase: 77 U/L (ref 38–126)
Anion gap: 10 (ref 5–15)
BUN: 16 mg/dL (ref 8–23)
CO2: 26 mmol/L (ref 22–32)
Calcium: 9.5 mg/dL (ref 8.9–10.3)
Chloride: 107 mmol/L (ref 98–111)
Creatinine, Ser: 1.67 mg/dL — ABNORMAL HIGH (ref 0.44–1.00)
GFR, Estimated: 34 mL/min — ABNORMAL LOW (ref >60)
Glucose, Bld: 92 mg/dL (ref 70–99)
Potassium: 3.8 mmol/L (ref 3.5–5.1)
Sodium: 143 mmol/L (ref 135–145)
Total Bilirubin: 0.4 mg/dL (ref 0.3–1.2)
Total Protein: 7.6 g/dL (ref 6.5–8.1)

## 2021-05-07 LAB — CBC WITH DIFFERENTIAL/PLATELET
Abs Immature Granulocytes: 0.02 10*3/uL (ref 0.00–0.07)
Basophils Absolute: 0 10*3/uL (ref 0.0–0.1)
Basophils Relative: 0 %
Eosinophils Absolute: 0.1 10*3/uL (ref 0.0–0.5)
Eosinophils Relative: 2 %
HCT: 40.7 % (ref 36.0–46.0)
Hemoglobin: 13 g/dL (ref 12.0–15.0)
Immature Granulocytes: 0 %
Lymphocytes Relative: 30 %
Lymphs Abs: 2.3 10*3/uL (ref 0.7–4.0)
MCH: 28.3 pg (ref 26.0–34.0)
MCHC: 31.9 g/dL (ref 30.0–36.0)
MCV: 88.5 fL (ref 80.0–100.0)
Monocytes Absolute: 0.9 10*3/uL (ref 0.1–1.0)
Monocytes Relative: 11 %
Neutro Abs: 4.5 10*3/uL (ref 1.7–7.7)
Neutrophils Relative %: 57 %
Platelets: 344 10*3/uL (ref 150–400)
RBC: 4.6 MIL/uL (ref 3.87–5.11)
RDW: 12.3 % (ref 11.5–15.5)
WBC: 7.9 10*3/uL (ref 4.0–10.5)
nRBC: 0 % (ref 0.0–0.2)

## 2021-05-07 MED ORDER — LIDOCAINE HCL (PF) 1 % IJ SOLN
INTRAMUSCULAR | Status: AC
  Filled 2021-05-07: qty 2

## 2021-05-07 MED ORDER — HYDROCHLOROTHIAZIDE 12.5 MG PO TABS: 12.5000 mg | ORAL_TABLET | Freq: Every day | ORAL | 0 refills | Status: DC

## 2021-05-07 MED ORDER — CEPHALEXIN 500 MG PO CAPS: 1000.0000 mg | ORAL_CAPSULE | Freq: Two times a day (BID) | ORAL | 0 refills | Status: DC

## 2021-05-07 MED ORDER — KETOROLAC TROMETHAMINE 30 MG/ML IJ SOLN
30.0000 mg | Freq: Once | INTRAMUSCULAR | Status: AC
  Administered 2021-05-07: 30 mg via INTRAMUSCULAR

## 2021-05-07 MED ORDER — KETOROLAC TROMETHAMINE 30 MG/ML IJ SOLN
INTRAMUSCULAR | Status: AC
  Filled 2021-05-07: qty 1

## 2021-05-07 MED ORDER — ONDANSETRON HCL 8 MG PO TABS: 8.0000 mg | ORAL_TABLET | Freq: Two times a day (BID) | ORAL | 0 refills | Status: AC | PRN

## 2021-05-07 MED ORDER — CEFTRIAXONE SODIUM 1 G IJ SOLR
1.0000 g | Freq: Once | INTRAMUSCULAR | Status: AC
  Administered 2021-05-07: 1 g via INTRAMUSCULAR

## 2021-05-07 MED ORDER — ONDANSETRON 4 MG PO TBDP
4.0000 mg | ORAL_TABLET | Freq: Once | ORAL | Status: AC
  Administered 2021-05-07: 4 mg via ORAL

## 2021-05-07 MED ORDER — CEFTRIAXONE SODIUM 1 G IJ SOLR
INTRAMUSCULAR | Status: AC
  Filled 2021-05-07: qty 10

## 2021-05-07 MED ORDER — ONDANSETRON 4 MG PO TBDP
ORAL_TABLET | ORAL | Status: AC
  Filled 2021-05-07: qty 1

## 2021-05-07 MED ORDER — MUPIROCIN CALCIUM 2 % EX CREA: 1.0000 "application " | TOPICAL_CREAM | Freq: Two times a day (BID) | CUTANEOUS | 0 refills | Status: AC

## 2021-05-07 NOTE — Discharge Instructions (Addendum)
Gives an injection of ceftriaxone (Rocephin) today which should treat the majority of the skin infection on your face.  I would like for you to follow-up with a second antibiotic called cephalexin (Keflex) 2 capsules to be taken twice daily or, alternately, 1 capsule 4 times daily depending on how much nausea you have with this.  I have also provided you with an antibiotic ointment that I would like you to apply to your face twice daily.  This should help with some of the more topical symptoms.  For your nausea I provided you with a prescription for ondansetron (Zofran) which you can take twice daily as needed.   I would like you to trial hydrochlorothiazide for your elevated blood pressure, it should treat your diastolic pressure fairly well which will bring your systolic pressure down.  Please follow-up with your primary care provider to discuss this medication and whether you should continue taking it.    You were also provided with an injection of ketorolac for your trigeminal neuralgia, I hope this addresses your pain.    As we discussed, should the redness on your face increase in size over the next 24 hours or aggressively increase in the next several hours please report to the emergency room as soon as possible for IV antibiotic treatment.  I do feel that given the quality of the drainage from the wound on your face that this is most likely due to cutaneous streptococcal infection.

## 2021-05-07 NOTE — ED Triage Notes (Signed)
PT reports initial eye drainage and now has red swollen area below the RT eye. Skin is also crusted.

## 2021-05-07 NOTE — ED Provider Notes (Signed)
Emergency Medicine Provider Triage Evaluation Note  SHANNICE WADLINGTON , a 65 y.o. female  was evaluated in triage.  Pt complains of cellulitis to the right side of her hands.  Evaluated at urgent care this morning, started on Keflex, also received a injection of Rocephin along with Toradol for pain control.  She reports no improvement since receiving this treatment.  Patient does report she was told "if it does not get any better in the next few hours you will need to go to the ER ".  She also called urgent care who referred her to the ED for IV antibiotics.  Review of Systems  Positive: wound Negative: Fever, changes in vision, headache  Physical Exam  BP (!) 152/90 (BP Location: Right Arm)   Pulse 81   Temp 98.6 F (37 C) (Oral)   Resp 16   SpO2 95%  Gen:   Awake, no distress   Resp:  Normal effort  MSK:   Moves extremities without difficulty  Other:    Medical Decision Making  Medically screening exam initiated at 6:41 PM.  Appropriate orders placed.  THRESA RODA was informed that the remainder of the evaluation will be completed by another provider, this initial triage assessment does not replace that evaluation, and the importance of remaining in the ED until their evaluation is complete.  Significant redness noted to the right cheek, with blistering and crusting.  Reports this has been present for the past week.  No systemic signs on today's visit.  Received Keflex, Toradol, Rocephin at urgent care today.   Janeece Fitting, PA-C 05/07/21 1847    Margette Fast, MD 05/14/21 1759

## 2021-05-07 NOTE — ED Provider Notes (Signed)
[place]    CSN: [id number] Arrival date & time: 05/07/21  1007      History   Chief Complaint Chief Complaint  Patient presents with   Eye Drainage   facial redness     HPI Sheryl Lester is a 65 y.o. female.   Patient complains of a rash on the right side of her face below her eyes lateral to her nose that has been draining honey colored fluid and crusting.  Patient states she has been aggressively removing the crusting because she does not like it.  Patient states the area is painful to touch but states the pain is very tolerable because she also suffers from trigeminal neuralgia which she has had for the past 15 years.  Patient is requesting a ketorolac injection today for her trigeminal neuralgia, states this has been the most effective treatment over the years and she tries to get when and whenever she can.  Patient denies fever, aches, chills, nausea, vomiting, diarrhea.  Has an extensive antibiotic intolerance less, states amoxicillin and Augmentin give her intolerable amount of diarrhea and nausea but adds that she is willing to take them if it will help to clear up her infection on her face.  Patient states she currently takes metoprolol for high blood pressure, states it makes her feel calm and she feels like it works well.  Patient advised that her blood pressure is elevated today, most notably her diastolic.  Patient endorses increased lower extremity edema mostly on her right side at the end of the day.   Rash Location:  Face Facial rash location:  Face Quality: blistering, burning, draining, painful, redness and weeping   Pain details:    Quality:  Burning, hot and stinging   Severity:  Moderate   Onset quality:  Gradual   Duration:  5 days   Timing:  Constant   Progression:  Worsening Severity:  Moderate Onset quality:  Sudden Duration:  5 days Timing:  Constant Progression:  Spreading Chronicity:  New Context: not animal contact, not chemical  exposure, not exposure to similar rash, not insect bite/sting, not plant contact, not sick contacts and not sun exposure   Relieved by:  None tried Worsened by:  Nothing Ineffective treatments:  None tried Associated symptoms: induration   Associated symptoms: no abdominal pain, no diarrhea, no fatigue, no fever, no headaches, no hoarse voice, no joint pain, no myalgias, no nausea, no periorbital edema, no shortness of breath, no sore throat, no throat swelling, no tongue swelling, no URI, not vomiting and not wheezing   Hypertension This is a chronic problem. The current episode started more than 1 week ago. The problem occurs constantly. Pertinent negatives include no chest pain, no abdominal pain, no headaches and no shortness of breath. Nothing aggravates the symptoms. The symptoms are relieved by medications. Patient states she has never had an infection like this before.  Past Medical History:  Diagnosis Date   ALLERGIC RHINITIS 01/20/2008   ANEMIA-IRON DEFICIENCY 01/20/2008   ANXIETY 01/20/2008   Arthritis    ASTHMATIC BRONCHITIS, ACUTE 05/19/2008   Bipolar disorder ([place])    CENTRAL SLEEP APNEA CONDS CLASSIFIED ELSEWHERE [id number]   Complication of anesthesia 1987   "w/one of the anesthesia agents that they don't use anymore; chest muscles constricted; felt like I couldn't breath"   DEPRESSION 01/20/2008   DYSPHAGIA UNSPECIFIED 12/06/2008   ELEVATED BLOOD PRESSURE WITHOUT DIAGNOSIS OF HYPERTENSION 12/06/2008   GERD    History of sleep walking  HYPERLIPIDEMIA 01/20/2008   HYPERTENSION 12/16/2008   Hypothyroidism    Melanoma ([place]) 2012   "skin of my stomach"   Migraine    "related to menses; went away after hysterectomy"   OSA treated with BiPAP    OTITIS MEDIA, ACUTE, BILATERAL 12/16/2008   OTITIS MEDIA, SEROUS, CHRONIC 12/30/2008   PVD 05/12/2010   Pyelonephritis "hospitalized in ~ 1991"   SINUSITIS- ACUTE-NOS 09/21/2008   SWELLING MASS OR LUMP IN HEAD AND NECK 09/21/2008   THYROID  NODULE, LEFT    "unable to aspirate; has gotten very small"    Trigeminal neuralgia 01/20/2008   right side o face   Unspecified hearing loss 09/21/2008   VENOUS INSUFFICIENCY, LEGS 05/03/2010    Patient Active Problem List   Diagnosis Date Noted   COVID-19 02/22/2021   Obesity 02/06/2021   Neoplasm of uncertain behavior of skin 10/06/2020   RUQ abdominal pain 07/28/2020   Nausea 07/28/2020   Skin abscess 07/28/2020   Vitamin D deficiency 05/25/2020   Confusion 09/22/2019   Elevated brain natriuretic peptide (BNP) level 09/26/2018   B12 deficiency 08/29/2018   Arthritis of right ankle 07/31/2018   Hyponatremia 07/18/2018   Generalized abdominal pain 02/25/2018   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 11/20/2016   Bipolar disorder, in partial remission, most recent episode depressed ([place]) 11/20/2016   Bipolar 1 disorder, depressed, moderate ([place]) 05/27/2016   Rash and nonspecific skin eruption 03/21/2016   Encounter for therapeutic drug monitoring 03/02/2016   Cholecystitis with cholelithiasis 12/12/2015   Multinodular goiter 06/14/2015   Chronic pain in face 03/25/2015   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   Migraine with aura, intractable 04/03/2013   Dysesthesia 02/19/2013   Osteopenia 12/23/2012   Intractable migraine 11/14/2012   Skin lesion 09/23/2012   Hearing loss 09/23/2012   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   Atypical facial pain 05/30/2012   Facial pain 05/29/2012   Spells 11/23/2011   Chronic interstitial cystitis 11/14/2011   Parasomnia 10/01/2011   Melanoma ([place]) 07/05/2011   Preventative health care 03/01/2011   PVD 05/12/2010   Essential hypertension 12/16/2008   TIA 12/16/2008   GERD 12/06/2008   Unspecified hearing loss 09/21/2008   HYPERLIPIDEMIA 01/20/2008   ANEMIA-IRON DEFICIENCY 01/20/2008   Depression 01/20/2008   Trigeminal neuralgia 01/20/2008   Allergic rhinitis 01/20/2008    Past Surgical History:  Procedure  Laterality Date   ABDOMINAL HYSTERECTOMY  2000   APPENDECTOMY  2000   BRAIN SURGERY     BREAST BIOPSY Left 1994   Benign   CEREBRAL [place]   for chronic facial pain/ [facility]   CHOLECYSTECTOMY N/A 12/13/2015   Procedure: LAPAROSCOPIC CHOLECYSTECTOMY;  Surgeon: Arta Bruce Kinsinger, MD;  Location: [facility];  Service: General;  Laterality: N/A;   COLONOSCOPY     x 2   INGUINAL HERNIA REPAIR Right 1987   MELANOMA EXCISION  2015   "skin of my stomach"   OOPHORECTOMY Bilateral 2000   OPEN REDUCTION INTERNAL FIXATION (ORIF) DISTAL RADIAL FRACTURE Right 10/01/2018   Procedure: OPEN REDUCTION INTERNAL FIXATION (ORIF) DISTAL RADIAL FRACTURE;  Surgeon: Iran Planas, MD;  Location: [facility];  Service: Orthopedics;  Laterality: Right;  75 minutes    OB History   No obstetric history on file.      Home Medications    Prior to Admission medications   Medication Sig Start Date End Date Taking? Authorizing Provider  cephALEXin (KEFLEX) 500 MG capsule Take 2 capsules (1,000 mg total) by mouth  2 (two) times daily for 7 days. 05/07/21 05/14/21 Yes Lynden Oxford Scales, PA-C  hydrochlorothiazide (HYDRODIURIL) 12.5 MG tablet Take 1 tablet (12.5 mg total) by mouth daily. 05/07/21 06/06/21 Yes Lynden Oxford Scales, PA-C  mupirocin cream (BACTROBAN) 2 % Apply 1 application topically 2 (two) times daily for 7 days. 05/07/21 05/14/21 Yes Lynden Oxford Scales, PA-C  ondansetron (ZOFRAN) 8 MG tablet Take 1 tablet (8 mg total) by mouth 2 (two) times daily as needed for up to 7 days for nausea or vomiting. 05/07/21 05/14/21 Yes Lynden Oxford Scales, PA-C  buprenorphine (BUTRANS) 20 MCG/HR PTWK Place 20 mcg onto the skin every Tuesday.    [provider]  dibucaine (NUPERCAINAL) 1 % ointment Apply 1 application topically every morning.    [provider]  famotidine (PEPCID) 40 MG tablet TAKE 1 TABLET BY MOUTH EVERY DAY 08/22/20   Plotnikov, Evie Lacks, MD  levothyroxine  (SYNTHROID) 50 MCG tablet TAKE 1 TABLET BY MOUTH EVERY DAY 08/01/20   Plotnikov, Evie Lacks, MD  metoprolol tartrate (LOPRESSOR) 50 MG tablet TAKE 1 TABLET BY MOUTH TWICE A DAY 05/05/21   Plotnikov, Evie Lacks, MD  mirtazapine (REMERON) 30 MG tablet Take 30 mg by mouth at bedtime.  04/09/18   [provider]  Misc Natural Products (TART CHERRY ADVANCED PO) Take 1 capsule by mouth 2 (two) times daily.    [provider]  naloxone [facility]) nasal spray 4 mg/0.1 mL Place 1 spray into the nose as needed (for overdose).  Patient not taking: Reported on 02/06/2021 06/12/18   [provider]  Nirmatrelvir & Ritonavir (PAXLOVID) 20 x 150 MG & 10 x '100MG'$  TBPK Take 3 tablets by mouth in the morning and at bedtime. As directed on a package 02/22/21   Plotnikov, Evie Lacks, MD  NUCYNTA 100 MG TABS Take 100 mg by mouth as needed (facial pain).  12/09/15   [provider]  triamcinolone ointment (KENALOG) 0.5 % Apply 1 application topically 2 (two) times daily. 10/06/20 10/06/21  Plotnikov, Evie Lacks, MD  TRINTELLIX 20 MG TABS tablet Take 20 mg by mouth daily. 07/07/20   [provider]  Vilazodone HCl (VIIBRYD) 40 MG TABS Take 40 mg by mouth every evening.     [provider]    Family History Family History  Problem Relation Age of Onset   Hyperlipidemia Mother    Hypertension Mother    Cancer - Cervical Mother    Diabetes Father    Melanoma Father    Anxiety disorder Father    Depression Father    Melanoma Sister    Anxiety disorder Sister    Depression Sister    Heart disease Paternal Grandmother    Anxiety disorder Brother    Depression Brother    Breast cancer Paternal Aunt    Heart disease Other        Mother side of family-several uncles    Anxiety disorder Sister    Depression Sister    Colon cancer Neg Hx    Stomach cancer Neg Hx     Social History Social History   Tobacco Use   Smoking status: Never   Smokeless tobacco: Never  Vaping  Use   Vaping Use: Never used  Substance Use Topics   Alcohol use: No   Drug use: No     Allergies   Aripiprazole, Fentanyl, Augmentin [amoxicillin-pot clavulanate], Avapro [irbesartan], Niacin and related, Other, Sulfamethoxazole-trimethoprim, Bupropion, and Ibuprofen   Review of Systems Review of Systems  Constitutional:  Negative for fatigue and fever.  HENT:  Negative for hoarse voice and sore throat.   Respiratory:  Negative for shortness of breath and wheezing.   Cardiovascular:  Negative for chest pain.  Gastrointestinal:  Negative for abdominal pain, diarrhea, nausea and vomiting.  Musculoskeletal:  Negative for arthralgias and myalgias.  Skin:  Positive for rash.  Neurological:  Negative for headaches.  All other systems reviewed and are negative.   Physical Exam Triage Vital Signs ED Triage Vitals  Enc Vitals Group     BP      Pulse      Resp      Temp      Temp src      SpO2      Weight      Height      Head Circumference      Peak Flow      Pain Score      Pain Loc      Pain Edu?      Excl. in [place]?    No data found.  Updated Vital Signs BP (!) 145/94   Pulse 77   Temp 99.1 F (37.3 C)   Resp 18   SpO2 96%   Visual Acuity Right Eye Distance:   Left Eye Distance:   Bilateral Distance:    Right Eye Near:   Left Eye Near:    Bilateral Near:     Physical Exam Constitutional:      Appearance: Normal appearance.  HENT:     Head: Normocephalic.     Comments: Right side of face with erythema, induration below right eye lateral to right nose with crusting, draining diffusely tender rash concerning for bullous impetigo.    Right Ear: Tympanic membrane, ear canal and external ear normal.     Left Ear: Tympanic membrane, ear canal and external ear normal.     Nose: No congestion or rhinorrhea.     Mouth/Throat:     Mouth: Mucous membranes are moist.     Pharynx: Oropharynx is clear.  Eyes:     General:        Right eye: No discharge.         Left eye: No discharge.     Extraocular Movements: Extraocular movements intact.     Conjunctiva/sclera: Conjunctivae normal.     Pupils: Pupils are equal, round, and reactive to light.  Cardiovascular:     Rate and Rhythm: Normal rate and regular rhythm.     Pulses: Normal pulses.     Heart sounds: Normal heart sounds. No murmur heard.   No friction rub. No gallop.  Pulmonary:     Effort: Pulmonary effort is normal. No respiratory distress.     Breath sounds: Normal breath sounds. No stridor. No wheezing, rhonchi or rales.  Musculoskeletal:     Cervical back: Normal range of motion and neck supple. No rigidity or tenderness.  Lymphadenopathy:     Cervical: No cervical adenopathy.  Skin:    General: Skin is warm and dry.     Findings: Erythema and lesion present.  Neurological:     General: No focal deficit present.     Mental Status: She is alert and oriented to person, place, and time.  Psychiatric:        Mood and Affect: Mood normal.        Behavior: Behavior normal.     UC Treatments / Results  Labs (all labs ordered are listed, but only  abnormal results are displayed) Labs Reviewed - No data to display  EKG   Radiology No results found.  Procedures Procedures (including critical care time)  Medications Ordered in UC Medications  cefTRIAXone (ROCEPHIN) injection 1 g (1 g Intramuscular Given 05/07/21 1134)  ketorolac (TORADOL) 30 MG/ML injection 30 mg (30 mg Intramuscular Given 05/07/21 1134)  ondansetron (ZOFRAN-ODT) disintegrating tablet 4 mg (4 mg Oral Given 05/07/21 1133)    Initial Impression / Assessment and Plan / UC Course  I have reviewed the triage vital signs and the nursing notes.  Pertinent labs & imaging results that were available during my care of the patient were reviewed by me and considered in my medical decision making (see chart for details).     Patient presents with an infectious lesion on the right side of her face that is concerning  for bullous impetigo, certainly cellulitis.  Patient was given an injection of Rocephin during visit today and advised to begin taking Keflex for the next 7 days.  I also provided her with a prescription for Bactroban.  Patient was advised to report to the emergency room should the area of redness induration increase in size in the next 6 hours.  Patient verbalized understanding.  Wound was dressed with bacitracin and a Telfa pad so that she can continue wearing her glasses without irritating the area.  Patient was given an injection of ketorolac during visit today at her request for her pain secondary to trigeminal neuralgia.  Tolerated well.  For patient's elevated blood pressure which was elevated at her most recent office visit as well, I recommend that she begin taking hydrochlorothiazide to address both her diastolic pressure as well as her intermittent lower extremity edema.  Prescription has been sent to her pharmacy.  Patient has been advised to follow-up with her primary care physician to discuss continuing this medication should it be effective for her.  Of note, patient reports intolerance of augmentin, SE is diarrhea. Pt verbalizes understanding of possible intolerance of ceftriaxone and agrees to inj today.    Final Clinical Impressions(s) / UC Diagnoses   Final diagnoses:  Cellulitis, face  Nausea without vomiting  Essential hypertension  Trigeminal neuralgia of right side of face  Bullous impetigo     Discharge Instructions      Gives an injection of ceftriaxone (Rocephin) today which should treat the majority of the skin infection on your face.  I would like for you to follow-up with a second antibiotic called cephalexin (Keflex) 2 capsules to be taken twice daily or, alternately, 1 capsule 4 times daily depending on how much nausea you have with this.  I have also provided you with an antibiotic ointment that I would like you to apply to your face twice daily.  This should  help with some of the more topical symptoms.  For your nausea I provided you with a prescription for ondansetron (Zofran) which you can take twice daily as needed.   I would like you to trial hydrochlorothiazide for your elevated blood pressure, it should treat your diastolic pressure fairly well which will bring your systolic pressure down.  Please follow-up with your primary care provider to discuss this medication and whether you should continue taking it.    You were also provided with an injection of ketorolac for your trigeminal neuralgia, I hope this addresses your pain.    As we discussed, should the redness on your face increase in size over the next 24 hours or aggressively increase  in the next several hours please report to the emergency room as soon as possible for IV antibiotic treatment.  I do feel that given the quality of the drainage from the wound on your face that this is most likely due to cutaneous streptococcal infection.       ED Prescriptions     Medication Sig Dispense Auth. Provider   hydrochlorothiazide (HYDRODIURIL) 12.5 MG tablet Take 1 tablet (12.5 mg total) by mouth daily. 30 tablet Lynden Oxford Scales, PA-C   ondansetron (ZOFRAN) 8 MG tablet Take 1 tablet (8 mg total) by mouth 2 (two) times daily as needed for up to 7 days for nausea or vomiting. 20 tablet Lynden Oxford Scales, PA-C   cephALEXin (KEFLEX) 500 MG capsule Take 2 capsules (1,000 mg total) by mouth 2 (two) times daily for 7 days. 28 capsule Lynden Oxford Scales, PA-C   mupirocin cream (BACTROBAN) 2 % Apply 1 application topically 2 (two) times daily for 7 days. 15 g Lynden Oxford Scales, PA-C      PDMP not reviewed this encounter.   Lynden Oxford Scales, PA-C 05/07/21 1221

## 2021-05-07 NOTE — ED Triage Notes (Signed)
Pt seen at [facility] for area of redness to right cheek. Diagnosed with cellulitis, started on abx, reports no improvement.

## 2021-05-08 ENCOUNTER — Ambulatory Visit: Payer: 59 | Admitting: Internal Medicine

## 2021-05-08 ENCOUNTER — Encounter: Payer: Self-pay | Admitting: Internal Medicine

## 2021-05-08 VITALS — BP 126/80 | HR 71 | Temp 98.4°F | Resp 18 | Ht 67.0 in | Wt 162.8 lb

## 2021-05-08 DIAGNOSIS — N179 Acute kidney failure, unspecified: Secondary | ICD-10-CM | POA: Diagnosis not present

## 2021-05-08 DIAGNOSIS — L03211 Cellulitis of face: Secondary | ICD-10-CM | POA: Diagnosis not present

## 2021-05-08 MED ORDER — CEFTRIAXONE SODIUM 1 G IJ SOLR
1.0000 g | Freq: Once | INTRAMUSCULAR | Status: AC
  Administered 2021-05-08: 1 g via INTRAMUSCULAR

## 2021-05-08 MED ORDER — KETOROLAC TROMETHAMINE 30 MG/ML IJ SOLN
30.0000 mg | Freq: Once | INTRAMUSCULAR | Status: AC
Start: 2021-05-08 — End: 2021-05-08
  Administered 2021-05-08: 30 mg via INTRAMUSCULAR

## 2021-05-08 NOTE — Progress Notes (Signed)
   Subjective:   Patient ID: Sheryl Lester, female    DOB: 02-16-1956, 65 y.o.   MRN: [id number]  HPI The patient is a 65 YO female coming in for rash and infection on face. Seen at urgent care and then ER due to persistence of the swelling and rash. Given ceftriaxone 1 g IM at the urgent care. Labs done at ER without evidence for sepsis. She did have mild change in renal function at that visit. Toradol was also given at the urgent care.   Review of Systems  Constitutional: Negative.   HENT: Negative.    Eyes: Negative.   Respiratory:  Negative for cough, chest tightness and shortness of breath.   Cardiovascular:  Negative for chest pain, palpitations and leg swelling.  Gastrointestinal:  Negative for abdominal distention, abdominal pain, constipation, diarrhea, nausea and vomiting.  Musculoskeletal: Negative.   Skin:  Positive for rash and wound.  Neurological: Negative.   Psychiatric/Behavioral: Negative.     Objective:  Physical Exam Constitutional:      Appearance: She is well-developed.  HENT:     Head: Normocephalic and atraumatic.  Cardiovascular:     Rate and Rhythm: Normal rate and regular rhythm.  Pulmonary:     Effort: Pulmonary effort is normal. No respiratory distress.     Breath sounds: Normal breath sounds. No wheezing or rales.  Abdominal:     General: Bowel sounds are normal. There is no distension.     Palpations: Abdomen is soft.     Tenderness: There is no abdominal tenderness. There is no rebound.  Musculoskeletal:     Cervical back: Normal range of motion.  Skin:    General: Skin is warm and dry.     Findings: Rash present.     Comments: Rash on the right cheek with some skin breakdown centrally (consistent with scratching). No blistering. Right eye without swelling, EOM intact bilaterally.   Neurological:     Mental Status: She is alert and oriented to person, place, and time.     Coordination: Coordination normal.    Vitals:   05/08/21 1533   BP: 126/80  Pulse: 71  Resp: 18  Temp: 98.4 F (36.9 C)  TempSrc: Oral  SpO2: 98%  Weight: 162 lb 12.8 oz (73.8 kg)  Height: '5\' 7"'$  (1.702 m)    This visit occurred during the SARS-CoV-2 public health emergency.  Safety protocols were in place, including screening questions prior to the visit, additional usage of staff PPE, and extensive cleaning of exam room while observing appropriate contact time as indicated for disinfecting solutions.   Assessment & Plan:  Ceftriaxone 1 g IM given and toradol 30 mg IM given at visit.

## 2021-05-08 NOTE — Assessment & Plan Note (Signed)
There was change in creatinine in ER labs. Rechecking BMP today.

## 2021-05-08 NOTE — Assessment & Plan Note (Signed)
Given toradol 30 mg IM at visit as well as ceftriaxone 1 g IM at visit. She is advised to take the keflex 1000 mg BID as prescribed by urgent care.

## 2021-05-08 NOTE — Patient Instructions (Signed)
We have given you ceftriaxone 1 g today. Keep taking the cephelexin (keflex) twice a day until gone.  We have given you the toradol injection today as well.

## 2021-05-09 ENCOUNTER — Telehealth: Payer: Self-pay | Admitting: Internal Medicine

## 2021-05-09 LAB — BASIC METABOLIC PANEL
BUN: 18 mg/dL (ref 6–23)
CO2: 24 mEq/L (ref 19–32)
Calcium: 9 mg/dL (ref 8.4–10.5)
Chloride: 100 mEq/L (ref 96–112)
Creatinine, Ser: 1.08 mg/dL (ref 0.40–1.20)
GFR: 54.74 mL/min — ABNORMAL LOW (ref >60.00)
Glucose, Bld: 89 mg/dL (ref 70–99)
Potassium: 3.6 mEq/L (ref 3.5–5.1)
Sodium: 137 mEq/L (ref 135–145)

## 2021-05-09 NOTE — Telephone Encounter (Signed)
Pt saw Dr. Sharlet Salina, and once labs are reviewed her assistant will contact w/ results.Marland KitchenJohny Lester

## 2021-05-09 NOTE — Telephone Encounter (Signed)
Patient calling in to check status of results from yesterdays lab  Advised patient results still pending & nurse will contact once results are in

## 2021-05-11 ENCOUNTER — Ambulatory Visit: Payer: 59 | Admitting: Internal Medicine

## 2021-05-11 ENCOUNTER — Telehealth: Payer: Self-pay | Admitting: Internal Medicine

## 2021-05-11 MED ORDER — CEPHALEXIN 500 MG PO CAPS: 1000.0000 mg | ORAL_CAPSULE | Freq: Two times a day (BID) | ORAL | 0 refills | Status: AC

## 2021-05-11 NOTE — Telephone Encounter (Signed)
Patient was seen at urgent care & had fu w/ dr. Sharlet Salina  Patient was prescribed cephALEXin (KEFLEX) 500 MG capsule by urgent care provider  Patient is currently out of town & left antibiotic at home & will not return for several days  Asking provider to send in new rx for antibiotic at out of town pharmacy so she can pick up:  [place], [place] [address] [phone]

## 2021-05-11 NOTE — Telephone Encounter (Signed)
Sent in

## 2021-05-11 NOTE — Telephone Encounter (Signed)
See below

## 2021-05-29 ENCOUNTER — Other Ambulatory Visit: Payer: Self-pay | Admitting: Internal Medicine

## 2021-06-11 ENCOUNTER — Other Ambulatory Visit: Payer: Self-pay | Admitting: Internal Medicine

## 2021-06-20 ENCOUNTER — Ambulatory Visit: Payer: 59 | Admitting: Internal Medicine

## 2021-06-21 ENCOUNTER — Ambulatory Visit: Payer: 59 | Admitting: Internal Medicine

## 2021-06-21 ENCOUNTER — Other Ambulatory Visit: Payer: Self-pay

## 2021-06-21 ENCOUNTER — Encounter: Payer: Self-pay | Admitting: Internal Medicine

## 2021-06-21 DIAGNOSIS — R1031 Right lower quadrant pain: Secondary | ICD-10-CM

## 2021-06-21 DIAGNOSIS — G500 Trigeminal neuralgia: Secondary | ICD-10-CM

## 2021-06-21 DIAGNOSIS — E538 Deficiency of other specified B group vitamins: Secondary | ICD-10-CM

## 2021-06-21 DIAGNOSIS — R4189 Other symptoms and signs involving cognitive functions and awareness: Secondary | ICD-10-CM

## 2021-06-21 NOTE — Assessment & Plan Note (Signed)
On B12 

## 2021-06-21 NOTE — Assessment & Plan Note (Addendum)
Chronic On pain regimen - [facility] Dr Mechele Dawley in [place]  Potential benefits of a long term opioids use as well as potential risks (i.e. addiction risk, apnea etc) and complications (i.e. Somnolence, constipation and others) were explained to the patient and were aknowledged.

## 2021-06-21 NOTE — Progress Notes (Signed)
Subjective:  Patient ID: Sheryl Lester, female    DOB: [birth year]  Age: 65 y.o. MRN: [id number]  CC: No chief complaint on file.   HPI [name] presents for severe pain in the R 1/2 of the abdomen - chronic - worse in the past few weeks 4-8/10. Constant - worse w/full bowel F/u chronic face pain - Gabapentin, Nucyntha, Butrans. H/o melanoma COVID in July 2022  Outpatient Medications Prior to Visit  Medication Sig Dispense Refill   buprenorphine (BUTRANS) 20 MCG/HR PTWK Place 20 mcg onto the skin every Tuesday.     dibucaine (NUPERCAINAL) 1 % ointment Apply 1 application topically every morning.     famotidine (PEPCID) 40 MG tablet TAKE 1 TABLET BY MOUTH EVERY DAY 90 tablet 3   hydrochlorothiazide (HYDRODIURIL) 12.5 MG tablet Take 1 tablet (12.5 mg total) by mouth daily. Annual appt due in Dec must see provider for future refills 90 tablet 0   levothyroxine (SYNTHROID) 50 MCG tablet TAKE 1 TABLET BY MOUTH EVERY DAY 90 tablet 3   metoprolol tartrate (LOPRESSOR) 50 MG tablet TAKE 1 TABLET BY MOUTH TWICE A DAY 180 tablet 1   mirtazapine (REMERON) 30 MG tablet Take 30 mg by mouth at bedtime.   5   Misc Natural Products (TART CHERRY ADVANCED PO) Take 1 capsule by mouth 2 (two) times daily.     Nirmatrelvir & Ritonavir (PAXLOVID) 20 x 150 MG & 10 x 100MG  TBPK Take 3 tablets by mouth in the morning and at bedtime. As directed on a package 30 tablet 0   NUCYNTA 100 MG TABS Take 100 mg by mouth as needed (facial pain).      triamcinolone ointment (KENALOG) 0.5 % Apply 1 application topically 2 (two) times daily. 60 g 1   TRINTELLIX 20 MG TABS tablet Take 20 mg by mouth daily.     Vilazodone HCl (VIIBRYD) 40 MG TABS Take 40 mg by mouth every evening.      naloxone (NARCAN) nasal spray 4 mg/0.1 mL Place 1 spray into the nose as needed (for overdose).  (Patient not taking: No sig reported)     No facility-administered medications prior to visit.    ROS: Review of Systems   Constitutional:  Positive for fatigue. Negative for activity change, appetite change, chills and unexpected weight change.  HENT:  Negative for congestion, mouth sores and sinus pressure.   Eyes:  Negative for visual disturbance.  Respiratory:  Negative for cough and chest tightness.   Gastrointestinal:  Positive for abdominal pain. Negative for nausea.  Genitourinary:  Negative for difficulty urinating, frequency and vaginal pain.  Musculoskeletal:  Negative for back pain and gait problem.  Skin:  Negative for pallor and rash.  Neurological:  Positive for headaches. Negative for dizziness, tremors, weakness and numbness.  Psychiatric/Behavioral:  Negative for confusion and sleep disturbance.    Objective:  BP 138/86 (BP Location: Left Arm, Patient Position: Sitting, Cuff Size: Large)   Pulse (!) 50   Temp 98.7 F (37.1 C) (Oral)   Ht 5\' 7"  (1.702 m)   Wt 160 lb (72.6 kg)   SpO2 96%   BMI 25.06 kg/m   BP Readings from Last 3 Encounters:  06/21/21 138/86  05/08/21 126/80  05/08/21 (!) 190/115    Wt Readings from Last 3 Encounters:  06/21/21 160 lb (72.6 kg)  05/08/21 162 lb 12.8 oz (73.8 kg)  02/06/21 177 lb 3.2 oz (80.4 kg)    Physical Exam Constitutional:  General: She is not in acute distress.    Appearance: She is well-developed.  HENT:     Head: Normocephalic.     Right Ear: External ear normal.     Left Ear: External ear normal.     Nose: Nose normal.  Eyes:     General:        Right eye: No discharge.        Left eye: No discharge.     Conjunctiva/sclera: Conjunctivae normal.     Pupils: Pupils are equal, round, and reactive to light.  Neck:     Thyroid: No thyromegaly.     Vascular: No JVD.     Trachea: No tracheal deviation.  Cardiovascular:     Rate and Rhythm: Normal rate and regular rhythm.     Heart sounds: Normal heart sounds.  Pulmonary:     Effort: No respiratory distress.     Breath sounds: No stridor. No wheezing.  Abdominal:      General: Bowel sounds are normal. There is no distension.     Palpations: Abdomen is soft. There is no mass.     Tenderness: There is no abdominal tenderness. There is no guarding or rebound.  Musculoskeletal:        General: No tenderness.     Cervical back: Normal range of motion and neck supple. No rigidity.  Lymphadenopathy:     Cervical: No cervical adenopathy.  Skin:    Findings: No erythema or rash.  Neurological:     Mental Status: She is oriented to person, place, and time.     Cranial Nerves: No cranial nerve deficit.     Motor: No abnormal muscle tone.     Coordination: Coordination normal.     Deep Tendon Reflexes: Reflexes normal.  Psychiatric:        Behavior: Behavior normal.        Thought Content: Thought content normal.        Judgment: Judgment normal.   Abd - NT, no hernia, no mass  Lab Results  Component Value Date   WBC 7.9 05/07/2021   HGB 13.0 05/07/2021   HCT 40.7 05/07/2021   PLT 344 05/07/2021   GLUCOSE 89 05/08/2021   CHOL 202 (H) 09/22/2019   TRIG 78.0 09/22/2019   HDL 53.20 09/22/2019   LDLDIRECT 116.2 02/12/2013   LDLCALC 133 (H) 09/22/2019   ALT 18 05/07/2021   AST 22 05/07/2021   NA 137 05/08/2021   K 3.6 05/08/2021   CL 100 05/08/2021   CREATININE 1.08 05/08/2021   BUN 18 05/08/2021   CO2 24 05/08/2021   TSH 0.48 05/25/2020   INR 0.97 05/25/2018   HGBA1C 5.8 09/22/2019    No results found.  Assessment & Plan:   Problem List Items Addressed This Visit     B12 deficiency    On B12      Relevant Orders   Vitamin B12   Brain fog    Post- COVID CBC, CMET Rest more      RLQ abdominal pain    Acute on chronic - severe pain in the R 1/2 of the abdomen - chronic - worse in the past few weeks 4-8/10. Constant - worse w/full bowel F/u chronic face pain - Gabapentin, Nucyntha, Butrans. Sounds neuropathic. Abd CT offered - declined. S/p chole, complete hysterectomy, appendectomy. Nl exam tody CT 2019, abd [place] 2021 -- ok Pain  Clinic - ?nerve block Use TENS unit CBC, CMET, B12  Relevant Orders   CBC with Differential/Platelet   Comprehensive metabolic panel   Trigeminal neuralgia    Chronic On pain regimen - Pain Clinic Dr Mechele Dawley in [place]  Potential benefits of a long term opioids use as well as potential risks (i.e. addiction risk, apnea etc) and complications (i.e. Somnolence, constipation and others) were explained to the patient and were aknowledged.      Relevant Orders   CBC with Differential/Platelet      No orders of the defined types were placed in this encounter.     Follow-up: Return in about 4 weeks (around 07/19/2021) for a follow-up visit.  Walker Kehr, MD

## 2021-06-21 NOTE — Assessment & Plan Note (Signed)
Post- COVID CBC, CMET Rest more

## 2021-06-21 NOTE — Assessment & Plan Note (Addendum)
Acute on chronic - severe pain in the R 1/2 of the abdomen - chronic - worse in the past few weeks 4-8/10. Constant - worse w/full bowel F/u chronic face pain - Gabapentin, Nucyntha, Butrans. Sounds neuropathic. Abd CT offered - declined. S/p chole, complete hysterectomy, appendectomy. Nl exam tody CT 2019, abd [place] 2021 -- ok Pain Clinic - ?nerve block Use TENS unit CBC, CMET, B12

## 2021-07-22 ENCOUNTER — Other Ambulatory Visit: Payer: Self-pay | Admitting: Internal Medicine

## 2021-08-10 ENCOUNTER — Telehealth: Payer: Self-pay | Admitting: Internal Medicine

## 2021-08-10 DIAGNOSIS — R1031 Right lower quadrant pain: Secondary | ICD-10-CM

## 2021-08-10 NOTE — Telephone Encounter (Signed)
Patient states she is having pain in her right lower quad  Patient requesting a call back to discuss ct scan

## 2021-08-11 ENCOUNTER — Telehealth: Payer: Self-pay | Admitting: Internal Medicine

## 2021-08-11 NOTE — Telephone Encounter (Signed)
Is [name] ready to have a CT scan of the abdomen done?  If yes, she will need to have blood work done prior.  Thanks

## 2021-08-11 NOTE — Telephone Encounter (Signed)
Duplicate msg.. see previous msg. Have spoken w/ pt needing to have CT scan..lmb

## 2021-08-11 NOTE — Telephone Encounter (Signed)
Patient called in stating she is having really bad pain  with on her left side and the pain is very bad. Patient would like a call back to discuss options on what to do about pain or if anything can be done.

## 2021-08-11 NOTE — Telephone Encounter (Signed)
Notified pt w/ MD response. Pt states yes she is ready to a ave CT scan ASAP...Sheryl Lester

## 2021-08-14 ENCOUNTER — Other Ambulatory Visit (INDEPENDENT_AMBULATORY_CARE_PROVIDER_SITE_OTHER): Payer: 59

## 2021-08-14 DIAGNOSIS — Z6830 Body mass index (BMI) 30.0-30.9, adult: Secondary | ICD-10-CM

## 2021-08-14 DIAGNOSIS — E538 Deficiency of other specified B group vitamins: Secondary | ICD-10-CM

## 2021-08-14 DIAGNOSIS — I10 Essential (primary) hypertension: Secondary | ICD-10-CM

## 2021-08-14 DIAGNOSIS — E6609 Other obesity due to excess calories: Secondary | ICD-10-CM

## 2021-08-14 DIAGNOSIS — G500 Trigeminal neuralgia: Secondary | ICD-10-CM

## 2021-08-14 DIAGNOSIS — R1031 Right lower quadrant pain: Secondary | ICD-10-CM | POA: Diagnosis not present

## 2021-08-14 LAB — CBC WITH DIFFERENTIAL/PLATELET
Basophils Absolute: 0.1 10*3/uL (ref 0.0–0.1)
Basophils Relative: 0.9 % (ref 0.0–3.0)
Eosinophils Absolute: 0 10*3/uL (ref 0.0–0.7)
Eosinophils Relative: 0.4 % (ref 0.0–5.0)
HCT: 38.2 % (ref 36.0–46.0)
Hemoglobin: 12.4 g/dL (ref 12.0–15.0)
Lymphocytes Relative: 14.3 % (ref 12.0–46.0)
Lymphs Abs: 1.6 10*3/uL (ref 0.7–4.0)
MCHC: 32.6 g/dL (ref 30.0–36.0)
MCV: 85.9 fl (ref 78.0–100.0)
Monocytes Absolute: 0.7 10*3/uL (ref 0.1–1.0)
Monocytes Relative: 6.5 % (ref 3.0–12.0)
Neutro Abs: 8.7 10*3/uL — ABNORMAL HIGH (ref 1.4–7.7)
Neutrophils Relative %: 77.9 % — ABNORMAL HIGH (ref 43.0–77.0)
Platelets: 279 10*3/uL (ref 150.0–400.0)
RBC: 4.45 Mil/uL (ref 3.87–5.11)
RDW: 13.2 % (ref 11.5–15.5)
WBC: 11.1 10*3/uL — ABNORMAL HIGH (ref 4.0–10.5)

## 2021-08-14 LAB — COMPREHENSIVE METABOLIC PANEL
ALT: 12 U/L (ref 0–35)
AST: 14 U/L (ref 0–37)
Albumin: 4.3 g/dL (ref 3.5–5.2)
Alkaline Phosphatase: 95 U/L (ref 39–117)
BUN: 23 mg/dL (ref 6–23)
CO2: 24 mEq/L (ref 19–32)
Calcium: 9.2 mg/dL (ref 8.4–10.5)
Chloride: 98 mEq/L (ref 96–112)
Creatinine, Ser: 0.95 mg/dL (ref 0.40–1.20)
GFR: 63.73 mL/min (ref >60.00)
Glucose, Bld: 89 mg/dL (ref 70–99)
Potassium: 4.7 mEq/L (ref 3.5–5.1)
Sodium: 132 mEq/L — ABNORMAL LOW (ref 135–145)
Total Bilirubin: 0.5 mg/dL (ref 0.2–1.2)
Total Protein: 7.7 g/dL (ref 6.0–8.3)

## 2021-08-14 LAB — T4, FREE: Free T4: 0.85 ng/dL (ref 0.60–1.60)

## 2021-08-14 LAB — TSH: TSH: 1.9 u[IU]/mL (ref 0.35–5.50)

## 2021-08-14 LAB — VITAMIN B12: Vitamin B-12: 483 pg/mL (ref 211–911)

## 2021-08-14 NOTE — Telephone Encounter (Signed)
Patient notified to go to [facility] to have Labs and CT scan done follow up with Dr. Alain Marion for results.

## 2021-08-14 NOTE — Telephone Encounter (Signed)
Okay.  CT ordered.  Labs prior. Follow-up with me with CT results please Thanks

## 2021-08-16 ENCOUNTER — Inpatient Hospital Stay: Admission: RE | Admit: 2021-08-16 | Payer: 59 | Source: Ambulatory Visit

## 2021-08-22 ENCOUNTER — Other Ambulatory Visit: Payer: Self-pay | Admitting: Internal Medicine

## 2021-08-22 DIAGNOSIS — R1031 Right lower quadrant pain: Secondary | ICD-10-CM

## 2021-08-23 ENCOUNTER — Other Ambulatory Visit: Payer: Self-pay

## 2021-08-23 ENCOUNTER — Ambulatory Visit (INDEPENDENT_AMBULATORY_CARE_PROVIDER_SITE_OTHER)
Admission: RE | Admit: 2021-08-23 | Discharge: 2021-08-23 | Disposition: A | Discharge status: Home / Self Care | Payer: 59 | Source: Ambulatory Visit | Attending: Internal Medicine | Admitting: Internal Medicine

## 2021-08-23 DIAGNOSIS — R1031 Right lower quadrant pain: Secondary | ICD-10-CM

## 2021-08-23 MED ORDER — IOHEXOL 300 MG/ML  SOLN
100.0000 mL | Freq: Once | INTRAMUSCULAR | Status: AC | PRN
  Administered 2021-08-23: 10:00:00 100 mL via INTRAVENOUS

## 2021-09-06 ENCOUNTER — Ambulatory Visit: Payer: 59 | Admitting: Internal Medicine

## 2021-09-11 ENCOUNTER — Ambulatory Visit: Payer: 59 | Admitting: Internal Medicine

## 2021-09-21 ENCOUNTER — Ambulatory Visit: Payer: 59 | Admitting: Internal Medicine

## 2021-09-21 ENCOUNTER — Encounter: Payer: Self-pay | Admitting: Internal Medicine

## 2021-09-21 ENCOUNTER — Other Ambulatory Visit: Payer: Self-pay

## 2021-09-21 VITALS — BP 130/82 | HR 73 | Temp 98.4°F | Ht 67.0 in | Wt 179.2 lb

## 2021-09-21 DIAGNOSIS — Z1211 Encounter for screening for malignant neoplasm of colon: Secondary | ICD-10-CM

## 2021-09-21 DIAGNOSIS — R1031 Right lower quadrant pain: Secondary | ICD-10-CM | POA: Diagnosis not present

## 2021-09-21 DIAGNOSIS — E538 Deficiency of other specified B group vitamins: Secondary | ICD-10-CM

## 2021-09-21 DIAGNOSIS — R519 Headache, unspecified: Secondary | ICD-10-CM

## 2021-09-21 DIAGNOSIS — G4733 Obstructive sleep apnea (adult) (pediatric): Secondary | ICD-10-CM

## 2021-09-21 NOTE — Assessment & Plan Note (Signed)
Abd CT 12/22 - WNL Treat constipation

## 2021-09-21 NOTE — Assessment & Plan Note (Signed)
On B12 

## 2021-09-21 NOTE — Progress Notes (Signed)
Subjective:  Patient ID: Sheryl Lester, female    DOB: 08/25/1956  Age: 66 y.o. MRN: [id number]  CC: Follow-up (F/U ON CT)   HPI Sheryl Lester presents for RLQ abd pain. Pt had a CT F/u on OSA; BiPAP machine was recalled  Outpatient Medications Prior to Visit  Medication Sig Dispense Refill   buprenorphine (BUTRANS) 20 MCG/HR PTWK Place 20 mcg onto the skin every Tuesday.     dibucaine (NUPERCAINAL) 1 % ointment Apply 1 application topically every morning.     famotidine (PEPCID) 40 MG tablet TAKE 1 TABLET BY MOUTH EVERY DAY 90 tablet 3   gabapentin (NEURONTIN) 600 MG tablet Take 1,200 mg by mouth 2 (two) times daily.     hydrochlorothiazide (HYDRODIURIL) 12.5 MG tablet Take 1 tablet (12.5 mg total) by mouth daily. Annual appt due in Dec must see provider for future refills 90 tablet 0   levothyroxine (SYNTHROID) 50 MCG tablet TAKE 1 TABLET BY MOUTH EVERY DAY 90 tablet 2   metoprolol tartrate (LOPRESSOR) 50 MG tablet TAKE 1 TABLET BY MOUTH TWICE A DAY 180 tablet 1   mirtazapine (REMERON) 30 MG tablet Take 30 mg by mouth at bedtime.   5   Misc Natural Products (TART CHERRY ADVANCED PO) Take 1 capsule by mouth 2 (two) times daily.     naloxone (NARCAN) nasal spray 4 mg/0.1 mL Place 1 spray into the nose as needed (for overdose).     Nirmatrelvir & Ritonavir (PAXLOVID) 20 x 150 MG & 10 x 100MG  TBPK Take 3 tablets by mouth in the morning and at bedtime. As directed on a package 30 tablet 0   NUCYNTA 100 MG TABS Take 100 mg by mouth as needed (facial pain).      triamcinolone ointment (KENALOG) 0.5 % Apply 1 application topically 2 (two) times daily. 60 g 1   TRINTELLIX 20 MG TABS tablet Take 20 mg by mouth daily.     Vilazodone HCl (VIIBRYD) 40 MG TABS Take 40 mg by mouth every evening.      No facility-administered medications prior to visit.    ROS: Review of Systems  Constitutional:  Negative for activity change, appetite change, chills, fatigue and unexpected weight change.   HENT:  Negative for congestion, mouth sores and sinus pressure.   Eyes:  Negative for visual disturbance.  Respiratory:  Negative for cough and chest tightness.   Gastrointestinal:  Negative for abdominal pain and nausea.  Genitourinary:  Negative for difficulty urinating, frequency and vaginal pain.  Musculoskeletal:  Negative for back pain and gait problem.  Skin:  Negative for pallor and rash.  Neurological:  Negative for dizziness, tremors, weakness, numbness and headaches.  Psychiatric/Behavioral:  Negative for confusion, sleep disturbance and suicidal ideas.    Objective:  BP 130/82 (BP Location: Left Arm)    Pulse 73    Temp 98.4 F (36.9 C) (Oral)    Ht 5\' 7"  (1.702 m)    Wt 179 lb 3.2 oz (81.3 kg)    SpO2 95%    BMI 28.07 kg/m   BP Readings from Last 3 Encounters:  09/21/21 130/82  06/21/21 138/86  05/08/21 126/80    Wt Readings from Last 3 Encounters:  09/21/21 179 lb 3.2 oz (81.3 kg)  06/21/21 160 lb (72.6 kg)  05/08/21 162 lb 12.8 oz (73.8 kg)    Physical Exam Constitutional:      General: She is not in acute distress.    Appearance: She is well-developed.  HENT:     Head: Normocephalic.     Right Ear: External ear normal.     Left Ear: External ear normal.     Nose: Nose normal.  Eyes:     General:        Right eye: No discharge.        Left eye: No discharge.     Conjunctiva/sclera: Conjunctivae normal.     Pupils: Pupils are equal, round, and reactive to light.  Neck:     Thyroid: No thyromegaly.     Vascular: No JVD.     Trachea: No tracheal deviation.  Cardiovascular:     Rate and Rhythm: Normal rate and regular rhythm.     Heart sounds: Normal heart sounds.  Pulmonary:     Effort: No respiratory distress.     Breath sounds: No stridor. No wheezing.  Abdominal:     General: Bowel sounds are normal. There is no distension.     Palpations: Abdomen is soft. There is no mass.     Tenderness: There is no abdominal tenderness. There is no guarding  or rebound.  Musculoskeletal:        General: No tenderness.     Cervical back: Normal range of motion and neck supple. No rigidity.  Lymphadenopathy:     Cervical: No cervical adenopathy.  Skin:    Findings: No erythema or rash.  Neurological:     Mental Status: She is oriented to person, place, and time.     Cranial Nerves: No cranial nerve deficit.     Motor: No abnormal muscle tone.     Coordination: Coordination normal.     Deep Tendon Reflexes: Reflexes normal.  Psychiatric:        Behavior: Behavior normal.        Thought Content: Thought content normal.        Judgment: Judgment normal.  Abdomen soft, nontender  Lab Results  Component Value Date   WBC 11.1 (H) 08/14/2021   HGB 12.4 08/14/2021   HCT 38.2 08/14/2021   PLT 279.0 08/14/2021   GLUCOSE 89 08/14/2021   CHOL 202 (H) 09/22/2019   TRIG 78.0 09/22/2019   HDL 53.20 09/22/2019   LDLDIRECT 116.2 02/12/2013   LDLCALC 133 (H) 09/22/2019   ALT 12 08/14/2021   AST 14 08/14/2021   NA 132 (L) 08/14/2021   K 4.7 08/14/2021   CL 98 08/14/2021   CREATININE 0.95 08/14/2021   BUN 23 08/14/2021   CO2 24 08/14/2021   TSH 1.90 08/14/2021   INR 0.97 05/25/2018   HGBA1C 5.8 09/22/2019    CT ABDOMEN PELVIS W CONTRAST  Result Date: 08/23/2021 CLINICAL DATA:  Worsening right lower quadrant pain for past 6 months. EXAM: CT ABDOMEN AND PELVIS WITH CONTRAST TECHNIQUE: Multidetector CT imaging of the abdomen and pelvis was performed using the standard protocol following bolus administration of intravenous contrast. CONTRAST:  162mL OMNIPAQUE IOHEXOL 300 MG/ML  SOLN COMPARISON:  05/21/2018 FINDINGS: Lower Chest: No acute findings. Hepatobiliary: No hepatic masses identified. Prior cholecystectomy. No evidence of biliary obstruction. Pancreas:  No mass or inflammatory changes. Spleen: Within normal limits in size and appearance. Adrenals/Urinary Tract: No masses identified. No evidence of ureteral calculi or hydronephrosis.  Stomach/Bowel: No evidence of obstruction, inflammatory process or abnormal fluid collections. Although the appendix is not directly visualized, no inflammatory process seen in region of the cecum or elsewhere. Vascular/Lymphatic: No pathologically enlarged lymph nodes. No acute vascular findings. Reproductive: Prior hysterectomy noted. Adnexal regions are  unremarkable in appearance. Other:  None. Musculoskeletal:  No suspicious bone lesions identified. IMPRESSION: Negative.  No acute findings or other significant abnormality. Electronically Signed   By: Marlaine Hind M.D.   On: 08/23/2021 16:40    Assessment & Plan:   Problem List Items Addressed This Visit     B12 deficiency    On B12      Colon cancer screening - Primary    Options discussed.  Cologuard ordered.      Relevant Orders   Cologuard   Facial pain    Chronic pain - Pain Clinic      OSA (obstructive sleep apnea)    Pulmonary referral to see Dr Elsworth Soho.  Her BiPAP machine was recalled.      RLQ abdominal pain    Abd CT 12/22 - WNL Treat constipation      Other Visit Diagnoses     OSA treated with BiPAP       Relevant Orders   Ambulatory referral to Pulmonology         No orders of the defined types were placed in this encounter.     Follow-up: Return in about 3 months (around 12/20/2021) for a follow-up visit.  Walker Kehr, MD

## 2021-09-21 NOTE — Assessment & Plan Note (Signed)
Chronic pain - Pain Clinic

## 2021-10-02 DIAGNOSIS — G4733 Obstructive sleep apnea (adult) (pediatric): Secondary | ICD-10-CM | POA: Insufficient documentation

## 2021-10-02 DIAGNOSIS — Z1211 Encounter for screening for malignant neoplasm of colon: Secondary | ICD-10-CM | POA: Insufficient documentation

## 2021-10-02 NOTE — Assessment & Plan Note (Signed)
Options discussed.  Cologuard ordered.

## 2021-10-02 NOTE — Assessment & Plan Note (Addendum)
Pulmonary referral to see Dr Elsworth Soho.  Her BiPAP machine was recalled.

## 2021-11-03 ENCOUNTER — Ambulatory Visit (INDEPENDENT_AMBULATORY_CARE_PROVIDER_SITE_OTHER): Payer: 59 | Admitting: Pulmonary Disease

## 2021-11-03 ENCOUNTER — Encounter: Payer: Self-pay | Admitting: Pulmonary Disease

## 2021-11-03 ENCOUNTER — Other Ambulatory Visit: Payer: Self-pay

## 2021-11-03 VITALS — BP 142/80 | HR 63 | Temp 98.4°F | Ht 67.0 in | Wt 179.0 lb

## 2021-11-03 DIAGNOSIS — F1190 Opioid use, unspecified, uncomplicated: Secondary | ICD-10-CM

## 2021-11-03 DIAGNOSIS — G4737 Central sleep apnea in conditions classified elsewhere: Secondary | ICD-10-CM

## 2021-11-03 NOTE — Progress Notes (Signed)
[facility]  Chief Complaint  Patient presents with   Consult    Patient says she needs a new machine.     Past Surgical History:  She  has a past surgical history that includes Cerebral microvascular decompression (1999); Oophorectomy (Bilateral, 2000); Appendectomy (2000); Breast biopsy (Left, 1994); Inguinal hernia repair (Right, 1987); Abdominal hysterectomy (2000); Brain surgery; Melanoma excision (2015); Cholecystectomy (N/A, 12/13/2015); Colonoscopy; and Open reduction internal fixation (orif) distal radial fracture (Right, 10/01/2018).  Past Medical History:  Allergies, IDA, Anxiety, OA, Bipolar, Depression, GERD, HLD, HTN, Hypothyroidism, Melanoma 2012, Migraine headaches, Chronic otitis, PAD, Pyelonephritis, Trigeminal neuralgia, COVID 19  Constitutional:  BP (!) 142/80 (BP Location: Right Arm, Patient Position: Sitting, Cuff Size: Normal)    Pulse 63    Temp 98.4 F (36.9 C) (Oral)    Ht '5\' 7"'  (1.702 m)    Wt 179 lb (81.2 kg)    SpO2 96%    BMI 28.04 kg/m   Brief Summary:  Sheryl Lester is a 66 y.o. female with central sleep apnea in setting of chronic opiate medication use.      Subjective:   She was seen in 2019 by Dr. Elsworth Soho.  He was found to have central sleep apnea in setting of chronic opioid medication use.  She had an old Philips device and then a Resmed device.  She never got a new machine that was ordered in 2020.  She is concerned about how sleep apnea is impacting her health.  She is restless at night.  She feels tired during the day.  She has chronic pain due to trigeminal neuralgia.  Physical Exam:   Appearance - well kempt   ENMT - no sinus tenderness, no oral exudate, no LAN, Mallampati 3 airway, no stridor  Respiratory - equal breath sounds bilaterally, no wheezing or rales  CV - s1s2 regular rate and rhythm, no murmurs  Ext - no clubbing, no edema  Skin - no rashes  Psych - normal mood and affect    Sleep Tests:  PSG 09/03/18 >> AHI 63.3, SpO2 low 81%, CAI 54.4  Cardiac Tests:  Echo 09/05/18 >> EF 55 to 60%, mod AR, ascending aorta 37 mm, mild MR, severe LA dilation, PASP 48 mmHg  Social History:  She  reports that she has never smoked. She has never used smokeless tobacco. She reports that she does not drink alcohol and does not use drugs.  Family History:  Her family history includes Anxiety disorder in her brother, father, sister, and sister; Breast cancer in her paternal aunt; Cancer - Cervical in her mother; Depression in her brother, father, sister, and sister; Diabetes in her father; Heart disease in her paternal grandmother and another family member; Hyperlipidemia in her mother; Hypertension in her mother; Melanoma in her father and sister.     Assessment/Plan:   Central sleep apnea with chronic opiate medication use. - will arrange for Bipap 11/7 cm H2O - explained she might need repeat sleep testing for insurance coverage prior to getting a new Bipap machine  Chronic opiate medication use in setting of trigeminal neuralgia. - followed at [facility]  Time Spent Involved in Patient Care on Day of Examination:  35 minutes  Follow up:   Patient Instructions  Will arrange for new Bipap set up  Follow up in 4 to 5 months  Medication List:   Allergies as of 11/03/2021       Reactions  Aripiprazole Swelling, Rash   *Abilify    Fentanyl    Other reaction(s): Other Sleep Walking   Augmentin [amoxicillin-pot Clavulanate]    Diarrhea. She can take amoxicillin   Avapro [irbesartan] Other (See Comments)   hyponatremia   Niacin And Related    swelling   Other Swelling   swelling   Sulfamethoxazole-trimethoprim Nausea And Vomiting   Bupropion Rash   Wellbutrin    Ibuprofen Nausea And Vomiting        Medication List        Accurate as of November 03, 2021  2:36 PM. If you have any questions, ask your nurse or doctor.           STOP taking these medications    dibucaine 1 % ointment Commonly known as: NUPERCAINAL Stopped by: Chesley Mires, MD   famotidine 40 MG tablet Commonly known as: PEPCID Stopped by: Chesley Mires, MD   hydrochlorothiazide 12.5 MG tablet Commonly known as: HYDRODIURIL Stopped by: Chesley Mires, MD   Paxlovid 20 x 150 MG & 10 x 100MG Tbpk Generic drug: nirmatrelvir & ritonavir Stopped by: Chesley Mires, MD   TART CHERRY ADVANCED PO Stopped by: Chesley Mires, MD   Viibryd 40 MG Tabs Generic drug: Vilazodone HCl Stopped by: Chesley Mires, MD       TAKE these medications    buprenorphine 20 MCG/HR Ptwk Commonly known as: BUTRANS Place 20 mcg onto the skin every Tuesday.   gabapentin 600 MG tablet Commonly known as: NEURONTIN Take 1,200 mg by mouth 2 (two) times daily.   levothyroxine 50 MCG tablet Commonly known as: SYNTHROID TAKE 1 TABLET BY MOUTH EVERY DAY   metoprolol tartrate 50 MG tablet Commonly known as: LOPRESSOR TAKE 1 TABLET BY MOUTH TWICE A DAY   mirtazapine 30 MG tablet Commonly known as: REMERON Take 30 mg by mouth at bedtime.   naloxone 4 MG/0.1ML Liqd nasal spray kit Commonly known as: NARCAN Place 1 spray into the nose as needed (for overdose).   Nucynta 100 MG Tabs Generic drug: Tapentadol HCl Take 100 mg by mouth as needed (facial pain).   Trintellix 20 MG Tabs tablet Generic drug: vortioxetine HBr Take 20 mg by mouth daily.        Signature:  Chesley Mires, MD [employer] Pager - [phone] 11/03/2021, 2:36 PM

## 2021-11-03 NOTE — Patient Instructions (Signed)
Will arrange for new Bipap set up ? ?Follow up in 4 to 5 months ?

## 2021-11-08 ENCOUNTER — Telehealth: Payer: Self-pay

## 2021-11-08 NOTE — Telephone Encounter (Signed)
[  9:09 AM] Sheryl Lester ? ?Dr Halford Chessman had put in a BIPAP order in on Sheryl Lester MRN [id number]. Brad from [employer] sent me this in regards to the order Order received.  ? ? Can we get Dr Halford Chessman to review for update to his OV note from 11-03-21 to say (patient was using and benefitting from Pap unit before malfunction)?  ? ?Dr. Halford Chessman please advise.  ?Thanks!  ? ? ?

## 2021-11-08 NOTE — Telephone Encounter (Signed)
I have updated the note from 11/03/21. ?

## 2021-11-09 NOTE — Telephone Encounter (Signed)
I've let Sheryl Lester know its been updated  ?

## 2021-12-18 ENCOUNTER — Telehealth: Payer: Self-pay | Admitting: Pulmonary Disease

## 2021-12-19 NOTE — Telephone Encounter (Signed)
Called and spoke with Anderson Malta at [facility]. She confirmed that [facility] has received the order and the order is process. If they do not have bipaps in stock, they will order one for the patient.  ? ?Called and spoke with patient. She stated that she spoke again with someone at [facility] and they advised her that they will be able to supply her with a bipap but that the wait was 4-6 weeks. She wanted to know if we could send the order to another DME for a faster processing. I advised her that if we sent the order to another DME it would take even longer as she would be a new patient. She verbalized understanding.  ? ?While on the phone, she wanted to know if she could use the nasal pillow mask with her bipap. She had used the full face mask before in the past and it was not comfortable for her.  ? ?Dr. Halford Chessman, can you please advise on the nasal pillow mask? Thanks!  ?

## 2021-12-19 NOTE — Telephone Encounter (Signed)
Okay for her to use a nasal pillow mask with Bipap.  Can send order to have this set up. ?

## 2021-12-19 NOTE — Telephone Encounter (Signed)
Spoke with the pt and notified okay for nasal pillow mask  ?She stated that she is needing order sent to somewhere besides [facility]  ?She would like to do some research and call back with name of new DME she wants to use  ?Will keep this msg open for this purpose ?

## 2021-12-21 ENCOUNTER — Ambulatory Visit: Payer: 59 | Admitting: Internal Medicine

## 2021-12-21 ENCOUNTER — Encounter: Payer: Self-pay | Admitting: Internal Medicine

## 2021-12-21 DIAGNOSIS — E039 Hypothyroidism, unspecified: Secondary | ICD-10-CM | POA: Diagnosis not present

## 2021-12-21 DIAGNOSIS — F3132 Bipolar disorder, current episode depressed, moderate: Secondary | ICD-10-CM | POA: Diagnosis not present

## 2021-12-21 DIAGNOSIS — Z780 Asymptomatic menopausal state: Secondary | ICD-10-CM

## 2021-12-21 DIAGNOSIS — E6609 Other obesity due to excess calories: Secondary | ICD-10-CM | POA: Diagnosis not present

## 2021-12-21 DIAGNOSIS — Z6830 Body mass index (BMI) 30.0-30.9, adult: Secondary | ICD-10-CM

## 2021-12-21 DIAGNOSIS — E538 Deficiency of other specified B group vitamins: Secondary | ICD-10-CM

## 2021-12-21 LAB — T4, FREE: Free T4: 1.04 ng/dL (ref 0.60–1.60)

## 2021-12-21 LAB — T3, FREE: T3, Free: 2.9 pg/mL (ref 2.3–4.2)

## 2021-12-21 LAB — TSH: TSH: 1.12 u[IU]/mL (ref 0.35–5.50)

## 2021-12-21 MED ORDER — WEGOVY 0.25 MG/0.5ML ~~LOC~~ SOAJ: 0.2500 mg | SUBCUTANEOUS | 2 refills | Status: DC

## 2021-12-21 NOTE — Assessment & Plan Note (Signed)
On Vit B12 

## 2021-12-21 NOTE — Assessment & Plan Note (Signed)
Will order DEXA scan.

## 2021-12-21 NOTE — Patient Instructions (Addendum)
Colog[employer]ard [phone] ? ? ?Sign up for [employer] ( via [employer] on your phone or your ipad). If you don't have a Art therapist card  - go to [employer] branch. They will set you up in 15 minutes. It is free. You can check out books to read and to listen, check out magazines and newspapers, movies etc. ? ? ?The Obesity Code book by Sharman Cheek ? ? ?These suggestions will probably help you to improve your metabolism if you are not overweight and to lose weight if you are overweight: ?1.  Reduce your consumption of sugars and starches.  Eliminate high fructose corn syrup from your diet.  Reduce your consumption of processed foods.  For desserts try to have seasonal fruits, berries, nuts, cheeses or dark chocolate with more than 70% cacao. ?2.  Do not snack ?3.  You do not have to eat breakfast.  If you choose to have breakfast - eat plain greek yogurt, eggs, oatmeal (without sugar) - use honey if you need to. ?4.  Drink water, freshly brewed unsweetened tea (green, black or herbal) or coffee.  Do not drink sodas including diet sodas , juices, beverages sweetened with artificial sweeteners. ?5.  Reduce your consumption of refined grains. ?6.  Avoid protein drinks such as Optifast, Slim fast etc. Eat chicken, fish, meat, dairy and beans for your sources of protein. ?7.  Natural unprocessed fats like cold pressed virgin olive oil, butter, coconut oil are good for you.  Eat avocados. ?8.  Increase your consumption of fiber.  Fruits, berries, vegetables, whole grains, flaxseed, chia seeds, beans, popcorn, nuts, oatmeal are good sources of fiber ?9.  Use vinegar in your diet, i.e. apple cider vinegar, red wine or balsamic vinegar ?10.  You can try fasting.  For example you can skip breakfast and lunch every other day (24-hour fast) ?11.  Stress reduction, good night sleep, relaxation, meditation, yoga and other physical activity is likely to help you to maintain low weight too. ?12.  If you drink alcohol, limit your  alcohol intake to no more than 2 drinks a day. ? ?

## 2021-12-21 NOTE — Assessment & Plan Note (Signed)
On Levothyroxine ?Check FT4, FT3 ?

## 2021-12-21 NOTE — Assessment & Plan Note (Signed)
Options discussed ?Start Wegovy ?On diet ?

## 2021-12-21 NOTE — Progress Notes (Signed)
? ?Subjective:  ?Patient ID: Sheryl Lester, female    DOB: 1956-07-05  Age: 66 y.o. MRN: [id number] ? ?CC: Follow-up (3 MONTH F/U) ? ? ?HPI ?Gala Romney presents for hypothyroidism, menopause, obesity concern ? ?Outpatient Medications Prior to Visit  ?Medication Sig Dispense Refill  ? buprenorphine (BUTRANS) 20 MCG/HR PTWK Place 20 mcg onto the skin every Tuesday.    ? gabapentin (NEURONTIN) 600 MG tablet Take 1,200 mg by mouth 2 (two) times daily.    ? levothyroxine (SYNTHROID) 50 MCG tablet TAKE 1 TABLET BY MOUTH EVERY DAY 90 tablet 2  ? metoprolol tartrate (LOPRESSOR) 50 MG tablet TAKE 1 TABLET BY MOUTH TWICE A DAY 180 tablet 1  ? naloxone (NARCAN) nasal spray 4 mg/0.1 mL Place 1 spray into the nose as needed (for overdose).    ? NUCYNTA 100 MG TABS Take 100 mg by mouth as needed (facial pain).     ? Suvorexant (BELSOMRA) 20 MG TABS Take 1 tablet by mouth at bedtime.    ? TRINTELLIX 20 MG TABS tablet Take 20 mg by mouth daily.    ? mirtazapine (REMERON) 30 MG tablet Take 30 mg by mouth at bedtime.  (Patient not taking: Reported on 12/21/2021)  5  ? ?No facility-administered medications prior to visit.  ? ? ?ROS: ?Review of Systems  ?Constitutional:  Negative for activity change, appetite change, chills, fatigue and unexpected weight change.  ?HENT:  Negative for congestion, mouth sores and sinus pressure.   ?Eyes:  Negative for visual disturbance.  ?Respiratory:  Negative for cough and chest tightness.   ?Gastrointestinal:  Negative for abdominal pain and nausea.  ?Genitourinary:  Negative for difficulty urinating, frequency and vaginal pain.  ?Musculoskeletal:  Negative for back pain and gait problem.  ?Skin:  Negative for pallor and rash.  ?Neurological:  Negative for dizziness, tremors, weakness, numbness and headaches.  ?Psychiatric/Behavioral:  Negative for confusion, sleep disturbance and suicidal ideas.   ? ?Objective:  ?BP (!) 148/90 (BP Location: Left Arm)   Pulse 64   Temp 97.6 ?F (36.4 ?C)  (Oral)   Ht '5\' 7"'$  (1.702 m)   Wt 165 lb 6.4 oz (75 kg)   SpO2 96%   BMI 25.91 kg/m?  ? ?BP Readings from Last 3 Encounters:  ?12/21/21 (!) 148/90  ?11/03/21 (!) 142/80  ?09/21/21 130/82  ? ? ?Wt Readings from Last 3 Encounters:  ?12/21/21 165 lb 6.4 oz (75 kg)  ?11/03/21 179 lb (81.2 kg)  ?09/21/21 179 lb 3.2 oz (81.3 kg)  ? ? ?Physical Exam ?Constitutional:   ?   General: She is not in acute distress. ?   Appearance: Normal appearance. She is well-developed.  ?HENT:  ?   Head: Normocephalic.  ?   Right Ear: External ear normal.  ?   Left Ear: External ear normal.  ?   Nose: Nose normal.  ?Eyes:  ?   General:     ?   Right eye: No discharge.     ?   Left eye: No discharge.  ?   Conjunctiva/sclera: Conjunctivae normal.  ?   Pupils: Pupils are equal, round, and reactive to light.  ?Neck:  ?   Thyroid: No thyromegaly.  ?   Vascular: No JVD.  ?   Trachea: No tracheal deviation.  ?Cardiovascular:  ?   Rate and Rhythm: Normal rate and regular rhythm.  ?   Heart sounds: Normal heart sounds.  ?Pulmonary:  ?   Effort: No respiratory distress.  ?  Breath sounds: No stridor. No wheezing.  ?Abdominal:  ?   General: Bowel sounds are normal. There is no distension.  ?   Palpations: Abdomen is soft. There is no mass.  ?   Tenderness: There is no abdominal tenderness. There is no guarding or rebound.  ?Musculoskeletal:     ?   General: No tenderness.  ?   Cervical back: Normal range of motion and neck supple. No rigidity.  ?Lymphadenopathy:  ?   Cervical: No cervical adenopathy.  ?Skin: ?   Findings: No erythema or rash.  ?Neurological:  ?   Cranial Nerves: No cranial nerve deficit.  ?   Motor: No abnormal muscle tone.  ?   Coordination: Coordination normal.  ?   Deep Tendon Reflexes: Reflexes normal.  ?Psychiatric:     ?   Behavior: Behavior normal.     ?   Thought Content: Thought content normal.     ?   Judgment: Judgment normal.  ? ? ?Lab Results  ?Component Value Date  ? WBC 11.1 (H) 08/14/2021  ? HGB 12.4 08/14/2021  ?  HCT 38.2 08/14/2021  ? PLT 279.0 08/14/2021  ? GLUCOSE 89 08/14/2021  ? CHOL 202 (H) 09/22/2019  ? TRIG 78.0 09/22/2019  ? HDL 53.20 09/22/2019  ? LDLDIRECT 116.2 02/12/2013  ? [place] 133 (H) 09/22/2019  ? ALT 12 08/14/2021  ? AST 14 08/14/2021  ? NA 132 (L) 08/14/2021  ? K 4.7 08/14/2021  ? CL 98 08/14/2021  ? CREATININE 0.95 08/14/2021  ? BUN 23 08/14/2021  ? CO2 24 08/14/2021  ? TSH 1.90 08/14/2021  ? INR 0.97 05/25/2018  ? HGBA1C 5.8 09/22/2019  ? ? ?CT ABDOMEN PELVIS W CONTRAST ? ?Result Date: 08/23/2021 ?CLINICAL DATA:  Worsening right lower quadrant pain for past 6 months. EXAM: CT ABDOMEN AND PELVIS WITH CONTRAST TECHNIQUE: Multidetector CT imaging of the abdomen and pelvis was performed using the standard protocol following bolus administration of intravenous contrast. CONTRAST:  127m OMNIPAQUE IOHEXOL 300 MG/ML  SOLN COMPARISON:  05/21/2018 FINDINGS: Lower Chest: No acute findings. Hepatobiliary: No hepatic masses identified. Prior cholecystectomy. No evidence of biliary obstruction. Pancreas:  No mass or inflammatory changes. Spleen: Within normal limits in size and appearance. Adrenals/Urinary Tract: No masses identified. No evidence of ureteral calculi or hydronephrosis. Stomach/Bowel: No evidence of obstruction, inflammatory process or abnormal fluid collections. Although the appendix is not directly visualized, no inflammatory process seen in region of the cecum or elsewhere. Vascular/Lymphatic: No pathologically enlarged lymph nodes. No acute vascular findings. Reproductive: Prior hysterectomy noted. Adnexal regions are unremarkable in appearance. Other:  None. Musculoskeletal:  No suspicious bone lesions identified. IMPRESSION: Negative.  No acute findings or other significant abnormality. Electronically Signed   By: JMarlaine HindM.D.   On: 08/23/2021 16:40  ? ? ?Assessment & Plan:  ? ?Problem List Items Addressed This Visit   ? ? Bipolar 1 disorder, depressed, moderate [place]  ?  Off Mirtazapine   ? ?  ?  ? B12 deficiency  ?  On Vit B12 ? ?  ?  ? Obesity  ?  Options discussed ?Start Wegovy ?On diet ? ?  ?  ? Relevant Medications  ? Semaglutide-Weight Management (WEGOVY) 0.25 MG/0.5ML SOAJ  ? Menopause  ?  Will order DEXA scan ? ?  ?  ? Relevant Orders  ? DG Bone Density  ? Hypothyroidism  ?  On Levothyroxine ?Check FT4, FT3 ? ?  ?  ? Relevant Orders  ? T4,  free  ? T3, free  ? TSH  ?  ? ? ?Meds ordered this encounter  ?Medications  ? Semaglutide-Weight Management (WEGOVY) 0.25 MG/0.5ML SOAJ  ?  Sig: Inject 0.25 mg into the skin once a week.  ?  Dispense:  2 mL  ?  Refill:  2  ?  ? ? ?Follow-up: Return in about 3 months (around 03/22/2022) for a follow-up visit. ? ?Walker Kehr, MD ?

## 2021-12-21 NOTE — Assessment & Plan Note (Signed)
Off Mirtazapine  ?

## 2022-01-04 ENCOUNTER — Inpatient Hospital Stay: Admission: RE | Admit: 2022-01-04 | Payer: 59 | Source: Ambulatory Visit

## 2022-03-22 ENCOUNTER — Encounter (HOSPITAL_COMMUNITY): Payer: Self-pay

## 2022-03-22 ENCOUNTER — Ambulatory Visit (HOSPITAL_COMMUNITY)
Admission: EM | Admit: 2022-03-22 | Discharge: 2022-03-22 | Disposition: A | Discharge status: Home / Self Care | Payer: Self-pay

## 2022-03-22 DIAGNOSIS — R1031 Right lower quadrant pain: Secondary | ICD-10-CM | POA: Insufficient documentation

## 2022-03-22 DIAGNOSIS — R350 Frequency of micturition: Secondary | ICD-10-CM | POA: Insufficient documentation

## 2022-03-22 DIAGNOSIS — N3010 Interstitial cystitis (chronic) without hematuria: Secondary | ICD-10-CM | POA: Insufficient documentation

## 2022-03-22 LAB — COMPREHENSIVE METABOLIC PANEL
ALT: 19 U/L (ref 0–44)
AST: 24 U/L (ref 15–41)
Albumin: 4.2 g/dL (ref 3.5–5.0)
Alkaline Phosphatase: 77 U/L (ref 38–126)
Anion gap: 8 (ref 5–15)
BUN: 13 mg/dL (ref 8–23)
CO2: 27 mmol/L (ref 22–32)
Calcium: 9.5 mg/dL (ref 8.9–10.3)
Chloride: 103 mmol/L (ref 98–111)
Creatinine, Ser: 0.96 mg/dL (ref 0.44–1.00)
GFR, Estimated: >60 mL/min (ref >60)
Glucose, Bld: 86 mg/dL (ref 70–99)
Potassium: 4.1 mmol/L (ref 3.5–5.1)
Sodium: 138 mmol/L (ref 135–145)
Total Bilirubin: 0.9 mg/dL (ref 0.3–1.2)
Total Protein: 7.6 g/dL (ref 6.5–8.1)

## 2022-03-22 LAB — CBC WITH DIFFERENTIAL/PLATELET
Abs Immature Granulocytes: 0.01 10*3/uL (ref 0.00–0.07)
Basophils Absolute: 0.1 10*3/uL (ref 0.0–0.1)
Basophils Relative: 1 %
Eosinophils Absolute: 0.3 10*3/uL (ref 0.0–0.5)
Eosinophils Relative: 4 %
HCT: 44.1 % (ref 36.0–46.0)
Hemoglobin: 14.4 g/dL (ref 12.0–15.0)
Immature Granulocytes: 0 %
Lymphocytes Relative: 32 %
Lymphs Abs: 2 10*3/uL (ref 0.7–4.0)
MCH: 28 pg (ref 26.0–34.0)
MCHC: 32.7 g/dL (ref 30.0–36.0)
MCV: 85.8 fL (ref 80.0–100.0)
Monocytes Absolute: 0.5 10*3/uL (ref 0.1–1.0)
Monocytes Relative: 7 %
Neutro Abs: 3.6 10*3/uL (ref 1.7–7.7)
Neutrophils Relative %: 56 %
Platelets: 261 10*3/uL (ref 150–400)
RBC: 5.14 MIL/uL — ABNORMAL HIGH (ref 3.87–5.11)
RDW: 11.9 % (ref 11.5–15.5)
WBC: 6.4 10*3/uL (ref 4.0–10.5)
nRBC: 0 % (ref 0.0–0.2)

## 2022-03-22 LAB — POCT URINALYSIS DIPSTICK, ED / UC
Bilirubin Urine: NEGATIVE
Glucose, UA: NEGATIVE mg/dL
Hgb urine dipstick: NEGATIVE
Ketones, ur: NEGATIVE mg/dL
Leukocytes,Ua: NEGATIVE
Nitrite: NEGATIVE
Protein, ur: NEGATIVE mg/dL
Specific Gravity, Urine: 1.02 (ref 1.005–1.030)
Urobilinogen, UA: 0.2 mg/dL (ref 0.0–1.0)
pH: 5.5 (ref 5.0–8.0)

## 2022-03-22 MED ORDER — KETOROLAC TROMETHAMINE 30 MG/ML IJ SOLN
INTRAMUSCULAR | Status: AC
  Filled 2022-03-22: qty 1

## 2022-03-22 MED ORDER — CELECOXIB 200 MG PO CAPS: 200.0000 mg | ORAL_CAPSULE | Freq: Two times a day (BID) | ORAL | 0 refills | Status: DC

## 2022-03-22 MED ORDER — KETOROLAC TROMETHAMINE 30 MG/ML IJ SOLN
30.0000 mg | Freq: Once | INTRAMUSCULAR | Status: AC
  Administered 2022-03-22: 30 mg via INTRAMUSCULAR

## 2022-03-22 NOTE — Discharge Instructions (Addendum)
Advised to take the Celebrex 200 mg, 1 capsule twice daily to help relieve the pain. The labs drawn are a complete blood count and CHEM screen, these will be completed within 48 hours. You can log on MyChart portal to be able to see the results.  If my nurse does not call you in 48 hours this means the results are normal. Advised to follow-up with PCP or report to the emergency room if symptoms fail to improve over the next 2 to 3 days.

## 2022-03-22 NOTE — ED Triage Notes (Signed)
Pt states that abd pain started out in the right lower quad and has now moved into the right mid abd. Pt reports taking more of her pain medication since pain started. Pt has vomited a few days ago but thought it to be food poisoning. Pt states she had a BM this am and felt better for a short while.

## 2022-03-22 NOTE — ED Provider Notes (Signed)
[name]    CSN: [id number] Arrival date & time: 03/22/22  1546      History   Chief Complaint Chief Complaint  Patient presents with   Abdominal Pain    HPI Sheryl Lester is a 66 y.o. female.   67 year old female presents with abdominal pain.  Patient relates that she has a history of having interstitial cystitis which bothers her periodically.  Patient indicates that her pain is usually in the right lower quadrant when this occurs.  Patient is status post gallbladder and appendix removal some years ago.  Patient indicates for the past several days she has been having some persistent and increasing right lower quadrant abdominal pain.  Patient indicates she has had increasing frequency associated and some intermittent dysuria.  She indicates that she had nausea and one episode of vomiting several days ago but this has resolved.  Patient indicates that she had a good bowel movement this morning which gave her some relief from the abdominal pain but it is still present.  Patient indicates that this pain is worse than her typical interstitial cystitis pain she indicates she has had to take some of her pain medicine in order to get relief from the discomfort.  Patient denies fever, but is having some intermittent mild chills.  Patient is tolerating fluids well.  Patient continues to take her regular medications.   Abdominal Pain   Past Medical History:  Diagnosis Date   ALLERGIC RHINITIS 01/20/2008   ANEMIA-IRON DEFICIENCY 01/20/2008   ANXIETY 01/20/2008   Arthritis    ASTHMATIC BRONCHITIS, ACUTE 05/19/2008   Bipolar disorder ([place])    CENTRAL SLEEP APNEA CONDS CLASSIFIED ELSEWHERE 03/27/8562   Complication of anesthesia 1987   "w/one of the anesthesia agents that they don't use anymore; chest muscles constricted; felt like I couldn't breath"   DEPRESSION 01/20/2008   DYSPHAGIA UNSPECIFIED 12/06/2008   ELEVATED BLOOD PRESSURE WITHOUT DIAGNOSIS OF HYPERTENSION 12/06/2008    GERD    History of sleep walking    HYPERLIPIDEMIA 01/20/2008   HYPERTENSION 12/16/2008   Hypothyroidism    Melanoma ([place]) 2012   "skin of my stomach"   Migraine    "related to menses; went away after hysterectomy"   OSA treated with BiPAP    OTITIS MEDIA, ACUTE, BILATERAL 12/16/2008   OTITIS MEDIA, SEROUS, CHRONIC 12/30/2008   PVD 05/12/2010   Pyelonephritis "hospitalized in ~ 1991"   SINUSITIS- ACUTE-NOS 09/21/2008   SWELLING MASS OR LUMP IN HEAD AND NECK 09/21/2008   THYROID NODULE, LEFT    "unable to aspirate; has gotten very small"    Trigeminal neuralgia 01/20/2008   right side o face   Unspecified hearing loss 09/21/2008   VENOUS INSUFFICIENCY, LEGS 05/03/2010    Patient Active Problem List   Diagnosis Date Noted   Menopause 12/21/2021   Hypothyroidism 12/21/2021   OSA (obstructive sleep apnea) 10/02/2021   Colon cancer screening 10/02/2021   Brain fog 06/21/2021   Facial cellulitis 05/08/2021   AKI (acute kidney injury) ([place]) 05/08/2021   COVID-19 02/22/2021   Obesity 02/06/2021   Neoplasm of uncertain behavior of skin 10/06/2020   RUQ abdominal pain 07/28/2020   Nausea 07/28/2020   Skin abscess 07/28/2020   Vitamin D deficiency 05/25/2020   Confusion 09/22/2019   Elevated brain natriuretic peptide (BNP) level 09/26/2018   B12 deficiency 08/29/2018   Arthritis of right ankle 07/31/2018   Hyponatremia 07/18/2018   RLQ abdominal pain 06/12/2018   Generalized abdominal pain 02/25/2018  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 11/20/2016   Bipolar disorder, in partial remission, most recent episode depressed ([place]) 11/20/2016   Bipolar 1 disorder, depressed, moderate ([place]) 05/27/2016   Rash and nonspecific skin eruption 03/21/2016   Encounter for therapeutic drug monitoring 03/02/2016   Cholecystitis with cholelithiasis 12/12/2015   Multinodular goiter 06/14/2015   Chronic pain in face 03/25/2015   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   Migraine with  aura, intractable 04/03/2013   Dysesthesia 02/19/2013   Osteopenia 12/23/2012   Intractable migraine 11/14/2012   Skin lesion 09/23/2012   Hearing loss 09/23/2012   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   Atypical facial pain 05/30/2012   Facial pain 05/29/2012   Spells 11/23/2011   Chronic interstitial cystitis 11/14/2011   Parasomnia 10/01/2011   Melanoma ([place]) 07/05/2011   Preventative health care 03/01/2011   PVD 05/12/2010   Essential hypertension 12/16/2008   TIA 12/16/2008   GERD 12/06/2008   Unspecified hearing loss 09/21/2008   HYPERLIPIDEMIA 01/20/2008   ANEMIA-IRON DEFICIENCY 01/20/2008   Depression 01/20/2008   Trigeminal neuralgia 01/20/2008   Allergic rhinitis 01/20/2008    Past Surgical History:  Procedure Laterality Date   ABDOMINAL HYSTERECTOMY  2000   APPENDECTOMY  2000   BRAIN SURGERY     BREAST BIOPSY Left 1994   Benign   CEREBRAL [place]   for chronic facial pain/ [facility]   CHOLECYSTECTOMY N/A 12/13/2015   Procedure: LAPAROSCOPIC CHOLECYSTECTOMY;  Surgeon: Arta Bruce Kinsinger, MD;  Location: [facility];  Service: General;  Laterality: N/A;   COLONOSCOPY     x 2   INGUINAL HERNIA REPAIR Right 1987   MELANOMA EXCISION  2015   "skin of my stomach"   OOPHORECTOMY Bilateral 2000   OPEN REDUCTION INTERNAL FIXATION (ORIF) DISTAL RADIAL FRACTURE Right 10/01/2018   Procedure: OPEN REDUCTION INTERNAL FIXATION (ORIF) DISTAL RADIAL FRACTURE;  Surgeon: Iran Planas, MD;  Location: [facility];  Service: Orthopedics;  Laterality: Right;  75 minutes    OB History     Gravida  1   Para      Term      Preterm      AB  1   Living         SAB      IAB      Ectopic      Multiple      Live Births               Home Medications    Prior to Admission medications   Medication Sig Start Date End Date Taking? Authorizing Provider  celecoxib (CELEBREX) 200 MG capsule Take 1 capsule (200 mg total) by mouth 2 (two) times daily.  03/22/22  Yes Nyoka Lint, PA-C  Doxepin HCl 6 MG TABS Take 1 tablet by mouth at bedtime. 03/02/22  Yes [provider]  gabapentin (NEURONTIN) 600 MG tablet Take 1,200 mg by mouth 2 (two) times daily.   Yes [provider]  levothyroxine (SYNTHROID) 50 MCG tablet TAKE 1 TABLET BY MOUTH EVERY DAY 07/24/21  Yes Plotnikov, Evie Lacks, MD  metoprolol tartrate (LOPRESSOR) 50 MG tablet TAKE 1 TABLET BY MOUTH TWICE A DAY 05/05/21  Yes Plotnikov, Evie Lacks, MD  TRINTELLIX 20 MG TABS tablet Take 20 mg by mouth daily. 07/07/20  Yes [provider]  buprenorphine (BUTRANS) 20 MCG/HR PTWK Place 20 mcg onto the skin every Tuesday.    [provider]  naloxone [facility]) nasal spray 4 mg/0.1 mL Place  1 spray into the nose as needed (for overdose). 06/12/18   [provider]  NUCYNTA 100 MG TABS Take 100 mg by mouth as needed (facial pain).  12/09/15   [provider]  Semaglutide-Weight Management (WEGOVY) 0.25 MG/0.5ML SOAJ Inject 0.25 mg into the skin once a week. 12/21/21   Plotnikov, Evie Lacks, MD  Suvorexant (BELSOMRA) 20 MG TABS Take 1 tablet by mouth at bedtime.    [provider]    Family History Family History  Problem Relation Age of Onset   Hyperlipidemia Mother    Hypertension Mother    Cancer - Cervical Mother    Diabetes Father    Melanoma Father    Anxiety disorder Father    Depression Father    Melanoma Sister    Anxiety disorder Sister    Depression Sister    Heart disease Paternal Grandmother    Anxiety disorder Brother    Depression Brother    Breast cancer Paternal Aunt    Heart disease Other        Mother side of family-several uncles    Anxiety disorder Sister    Depression Sister    Colon cancer Neg Hx    Stomach cancer Neg Hx     Social History Social History   Tobacco Use   Smoking status: Never   Smokeless tobacco: Never  Vaping Use   Vaping Use: Never used  Substance Use Topics   Alcohol use: No    Drug use: No     Allergies   Aripiprazole, Fentanyl, Augmentin [amoxicillin-pot clavulanate], Avapro [irbesartan], Niacin and related, Other, Sulfamethoxazole-trimethoprim, Bupropion, and Ibuprofen   Review of Systems Review of Systems  Gastrointestinal:  Positive for abdominal pain (right lower quadrant).     Physical Exam Triage Vital Signs ED Triage Vitals  Enc Vitals Group     BP 03/22/22 1637 (!) 137/93     Pulse Rate 03/22/22 1637 80     Resp 03/22/22 1637 18     Temp 03/22/22 1637 98.4 F (36.9 C)     Temp Source 03/22/22 1637 Oral     SpO2 03/22/22 1637 95 %     Weight --      Height --      Head Circumference --      Peak Flow --      Pain Score 03/22/22 1640 6     Pain Loc --      Pain Edu? --      Excl. in [place]? --    No data found.  Updated Vital Signs BP (!) 137/93 (BP Location: Left Arm)   Pulse 80   Temp 98.4 F (36.9 C) (Oral)   Resp 18   SpO2 95%   Visual Acuity Right Eye Distance:   Left Eye Distance:   Bilateral Distance:    Right Eye Near:   Left Eye Near:    Bilateral Near:     Physical Exam Constitutional:      Appearance: She is well-developed.  Abdominal:     General: Abdomen is flat. Bowel sounds are increased.     Palpations: Abdomen is soft.     Tenderness: There is abdominal tenderness in the right lower quadrant. There is no guarding or rebound.       Comments: Abdomen: Pain is palpated in the right lower quadrant, no guarding or rebound present.  Neurological:     Mental Status: She is alert.      UC Treatments / Results  Labs (all labs ordered are listed, but only abnormal results are displayed) Labs Reviewed  CBC WITH DIFFERENTIAL/PLATELET  COMPREHENSIVE METABOLIC PANEL  POCT URINALYSIS DIPSTICK, ED / UC    EKG   Radiology No results found.  Procedures Procedures (including critical care time)  Medications Ordered in UC Medications  ketorolac (TORADOL) 30 MG/ML injection 30 mg (has no administration  in time range)    Initial Impression / Assessment and Plan / UC Course  I have reviewed the triage vital signs and the nursing notes.  Pertinent labs & imaging results that were available during my care of the patient were reviewed by me and considered in my medical decision making (see chart for details).    Plan: 1.  CBC and CMP are pending. 2.  Advised to continue present medications and and Celebrex 200 mg, 1 capsule twice daily to help relieve the pain. 3.  Advised to follow-up with PCP or report to the emergency room symptoms fail to improve over the next 48 to 72 hours. Final Clinical Impressions(s) / UC Diagnoses   Final diagnoses:  Right lower quadrant abdominal pain  Frequency of urination  Interstitial cystitis     Discharge Instructions      Advised to take the Celebrex 200 mg, 1 capsule twice daily to help relieve the pain. The labs drawn are a complete blood count and CHEM screen, these will be completed within 48 hours. You can log on MyChart portal to be able to see the results.  If my nurse does not call you in 48 hours this means the results are normal. Advised to follow-up with PCP or report to the emergency room if symptoms fail to improve over the next 2 to 3 days.    ED Prescriptions     Medication Sig Dispense Auth. Provider   celecoxib (CELEBREX) 200 MG capsule Take 1 capsule (200 mg total) by mouth 2 (two) times daily. 10 capsule Nyoka Lint, PA-C      PDMP not reviewed this encounter.   Nyoka Lint, PA-C 03/22/22 1735

## 2022-03-26 ENCOUNTER — Encounter: Payer: Self-pay | Admitting: Internal Medicine

## 2022-03-26 ENCOUNTER — Ambulatory Visit: Payer: 59 | Admitting: Internal Medicine

## 2022-03-26 VITALS — BP 128/78 | HR 90 | Temp 97.9°F | Ht 67.0 in | Wt 150.0 lb

## 2022-03-26 DIAGNOSIS — R1031 Right lower quadrant pain: Secondary | ICD-10-CM

## 2022-03-26 DIAGNOSIS — R1084 Generalized abdominal pain: Secondary | ICD-10-CM | POA: Diagnosis not present

## 2022-03-26 DIAGNOSIS — R634 Abnormal weight loss: Secondary | ICD-10-CM

## 2022-03-26 DIAGNOSIS — K5904 Chronic idiopathic constipation: Secondary | ICD-10-CM | POA: Diagnosis not present

## 2022-03-26 MED ORDER — ONDANSETRON HCL 4 MG PO TABS: 4.0000 mg | ORAL_TABLET | Freq: Three times a day (TID) | ORAL | 0 refills | Status: DC | PRN

## 2022-03-26 MED ORDER — LACTULOSE 20 GM/30ML PO SOLN: 30.0000 mL | Freq: Two times a day (BID) | ORAL | 3 refills | Status: DC | PRN

## 2022-03-26 MED ORDER — LEVOFLOXACIN 500 MG PO TABS: 500.0000 mg | ORAL_TABLET | Freq: Every day | ORAL | 0 refills | Status: DC

## 2022-03-26 NOTE — Assessment & Plan Note (Addendum)
Wt Readings from Last 3 Encounters:  03/26/22 150 lb (68 kg)  12/21/21 165 lb 6.4 oz (75 kg)  11/03/21 179 lb (81.2 kg)  New Labs reviewed GI referral

## 2022-03-26 NOTE — Assessment & Plan Note (Addendum)
Abd X ray Zofran prn GI ref Dr Henrene Pastor Empiric abx - Levaquin po Go to ER if worse RTC 1 week

## 2022-03-26 NOTE — Assessment & Plan Note (Addendum)
Worse GI ref Abd X ray Treat constipation: Lactulose po RTC 1 wk

## 2022-03-26 NOTE — Progress Notes (Signed)
Subjective:  Patient ID: Sheryl Lester, female    DOB: 1956-04-26  Age: 66 y.o. MRN: [id number]  CC: No chief complaint on file.   HPI Sheryl Lester presents for RLQ abd pain (7-8/10) x 10-12 d - getting worse. Last BM 2-3 d ago w/an enema - brown stool. No fever. No n/v. Not eating x 3 d. Pt went to UC on 7/14. Last colon 2011 - nl (Dr Henrene Pastor).  Outpatient Medications Prior to Visit  Medication Sig Dispense Refill   buprenorphine (BUTRANS) 20 MCG/HR PTWK Place 20 mcg onto the skin every Tuesday.     celecoxib (CELEBREX) 200 MG capsule Take 1 capsule (200 mg total) by mouth 2 (two) times daily. 10 capsule 0   Doxepin HCl 6 MG TABS Take 1 tablet by mouth at bedtime.     gabapentin (NEURONTIN) 600 MG tablet Take 1,200 mg by mouth 2 (two) times daily.     levothyroxine (SYNTHROID) 50 MCG tablet TAKE 1 TABLET BY MOUTH EVERY DAY 90 tablet 2   metoprolol tartrate (LOPRESSOR) 50 MG tablet TAKE 1 TABLET BY MOUTH TWICE A DAY 180 tablet 1   naloxone (NARCAN) nasal spray 4 mg/0.1 mL Place 1 spray into the nose as needed (for overdose).     NUCYNTA 100 MG TABS Take 100 mg by mouth as needed (facial pain).      Suvorexant (BELSOMRA) 20 MG TABS Take 1 tablet by mouth at bedtime.     TRINTELLIX 20 MG TABS tablet Take 20 mg by mouth daily.     Semaglutide-Weight Management (WEGOVY) 0.25 MG/0.5ML SOAJ Inject 0.25 mg into the skin once a week. 2 mL 2   No facility-administered medications prior to visit.    ROS: Review of Systems  Constitutional:  Positive for fatigue. Negative for activity change, appetite change, chills and unexpected weight change.  HENT:  Negative for congestion, mouth sores and sinus pressure.   Eyes:  Negative for visual disturbance.  Respiratory:  Negative for cough and chest tightness.   Gastrointestinal:  Positive for abdominal pain and constipation. Negative for nausea.  Genitourinary:  Negative for difficulty urinating, frequency and vaginal pain.   Musculoskeletal:  Positive for arthralgias. Negative for back pain and gait problem.  Skin:  Negative for pallor and rash.  Neurological:  Negative for dizziness, tremors, weakness, numbness and headaches.  Psychiatric/Behavioral:  Negative for confusion, sleep disturbance and suicidal ideas. The patient is not nervous/anxious.     Objective:  BP 128/78 (BP Location: Right Arm, Patient Position: Sitting, Cuff Size: Large)   Pulse 90   Temp 97.9 F (36.6 C) (Oral)   Ht '5\' 7"'$  (1.702 m)   Wt 150 lb (68 kg)   SpO2 97%   BMI 23.49 kg/m   BP Readings from Last 3 Encounters:  03/26/22 128/78  03/22/22 (!) 137/93  12/21/21 (!) 148/90    Wt Readings from Last 3 Encounters:  03/26/22 150 lb (68 kg)  12/21/21 165 lb 6.4 oz (75 kg)  11/03/21 179 lb (81.2 kg)    Physical Exam Constitutional:      General: She is in acute distress.     Appearance: She is well-developed. She is not ill-appearing, toxic-appearing or diaphoretic.  HENT:     Head: Normocephalic.     Right Ear: External ear normal.     Left Ear: External ear normal.     Nose: Nose normal.  Eyes:     General:  Right eye: No discharge.        Left eye: No discharge.     Conjunctiva/sclera: Conjunctivae normal.     Pupils: Pupils are equal, round, and reactive to light.  Neck:     Thyroid: No thyromegaly.     Vascular: No JVD.     Trachea: No tracheal deviation.  Cardiovascular:     Rate and Rhythm: Normal rate and regular rhythm.     Heart sounds: Normal heart sounds.  Pulmonary:     Effort: No respiratory distress.     Breath sounds: No stridor. No wheezing.  Abdominal:     General: Bowel sounds are normal. There is no distension.     Palpations: Abdomen is soft. There is no mass.     Tenderness: There is abdominal tenderness. There is no guarding or rebound.     Hernia: No hernia is present.  Genitourinary:    Rectum: Normal. Guaiac result negative.  Musculoskeletal:        General: No tenderness.      Cervical back: Normal range of motion and neck supple. No rigidity.  Lymphadenopathy:     Cervical: No cervical adenopathy.  Skin:    Findings: No erythema or rash.  Neurological:     Mental Status: She is oriented to person, place, and time.     Cranial Nerves: No cranial nerve deficit.     Motor: No abnormal muscle tone.     Coordination: Coordination normal.     Deep Tendon Reflexes: Reflexes normal.  Psychiatric:        Behavior: Behavior normal.        Thought Content: Thought content normal.        Judgment: Judgment normal.   Pain in the abd R>L No mass, no rebound  Rectal - nl, no stool  Lab Results  Component Value Date   WBC 6.4 03/22/2022   HGB 14.4 03/22/2022   HCT 44.1 03/22/2022   PLT 261 03/22/2022   GLUCOSE 86 03/22/2022   CHOL 202 (H) 09/22/2019   TRIG 78.0 09/22/2019   HDL 53.20 09/22/2019   LDLDIRECT 116.2 02/12/2013   LDLCALC 133 (H) 09/22/2019   ALT 19 03/22/2022   AST 24 03/22/2022   NA 138 03/22/2022   K 4.1 03/22/2022   CL 103 03/22/2022   CREATININE 0.96 03/22/2022   BUN 13 03/22/2022   CO2 27 03/22/2022   TSH 1.12 12/21/2021   INR 0.97 05/25/2018   HGBA1C 5.8 09/22/2019    No results found.  Assessment & Plan:   Problem List Items Addressed This Visit     Chronic idiopathic constipation    Worse GI ref Abd X ray Treat constipation: Lactulose po RTC 1 wk      Relevant Medications   Lactulose 20 GM/30ML SOLN   Other Relevant Orders   Ambulatory referral to Gastroenterology   Generalized abdominal pain - Primary   Relevant Orders   DG Abd 2 Views   Ambulatory referral to Gastroenterology   RLQ abdominal pain    Abd X ray Zofran prn GI ref Dr Henrene Pastor Empiric abx - Levaquin po Go to ER if worse RTC 1 week       Weight loss    Wt Readings from Last 3 Encounters:  03/26/22 150 lb (68 kg)  12/21/21 165 lb 6.4 oz (75 kg)  11/03/21 179 lb (81.2 kg)  New Labs reviewed GI referral          Meds ordered this  encounter  Medications   levofloxacin (LEVAQUIN) 500 MG tablet    Sig: Take 1 tablet (500 mg total) by mouth daily.    Dispense:  10 tablet    Refill:  0   ondansetron (ZOFRAN) 4 MG tablet    Sig: Take 1 tablet (4 mg total) by mouth every 8 (eight) hours as needed for nausea or vomiting.    Dispense:  20 tablet    Refill:  0   Lactulose 20 GM/30ML SOLN    Sig: Take 30-60 mLs (20-40 g total) by mouth 2 (two) times daily as needed (severe constipation).    Dispense:  450 mL    Refill:  3      Follow-up: Return in about 1 week (around 04/02/2022) for a follow-up visit.  Walker Kehr, MD

## 2022-03-31 DIAGNOSIS — N9489 Other specified conditions associated with female genital organs and menstrual cycle: Secondary | ICD-10-CM | POA: Insufficient documentation

## 2022-03-31 DIAGNOSIS — M6289 Other specified disorders of muscle: Secondary | ICD-10-CM | POA: Insufficient documentation

## 2022-03-31 DIAGNOSIS — Z8673 Personal history of transient ischemic attack (TIA), and cerebral infarction without residual deficits: Secondary | ICD-10-CM | POA: Insufficient documentation

## 2022-04-23 ENCOUNTER — Other Ambulatory Visit: Payer: Self-pay | Admitting: Internal Medicine

## 2022-04-24 ENCOUNTER — Ambulatory Visit (INDEPENDENT_AMBULATORY_CARE_PROVIDER_SITE_OTHER): Payer: PRIVATE HEALTH INSURANCE | Admitting: Internal Medicine

## 2022-04-24 ENCOUNTER — Telehealth: Payer: Self-pay | Admitting: Internal Medicine

## 2022-04-24 ENCOUNTER — Encounter: Payer: Self-pay | Admitting: Internal Medicine

## 2022-04-24 ENCOUNTER — Ambulatory Visit (INDEPENDENT_AMBULATORY_CARE_PROVIDER_SITE_OTHER): Payer: PRIVATE HEALTH INSURANCE

## 2022-04-24 VITALS — BP 138/86 | HR 68 | Temp 97.7°F | Ht 67.0 in | Wt 151.8 lb

## 2022-04-24 DIAGNOSIS — G8929 Other chronic pain: Secondary | ICD-10-CM

## 2022-04-24 DIAGNOSIS — R1031 Right lower quadrant pain: Secondary | ICD-10-CM

## 2022-04-24 DIAGNOSIS — K6289 Other specified diseases of anus and rectum: Secondary | ICD-10-CM

## 2022-04-24 DIAGNOSIS — K5904 Chronic idiopathic constipation: Secondary | ICD-10-CM | POA: Diagnosis not present

## 2022-04-24 DIAGNOSIS — R634 Abnormal weight loss: Secondary | ICD-10-CM

## 2022-04-24 DIAGNOSIS — R1011 Right upper quadrant pain: Secondary | ICD-10-CM

## 2022-04-24 DIAGNOSIS — E538 Deficiency of other specified B group vitamins: Secondary | ICD-10-CM

## 2022-04-24 LAB — URINALYSIS
Bilirubin Urine: NEGATIVE
Hgb urine dipstick: NEGATIVE
Ketones, ur: NEGATIVE
Leukocytes,Ua: NEGATIVE
Nitrite: NEGATIVE
Specific Gravity, Urine: 1.02 (ref 1.000–1.030)
Total Protein, Urine: NEGATIVE
Urine Glucose: NEGATIVE
Urobilinogen, UA: 0.2 (ref 0.0–1.0)
pH: 6 (ref 5.0–8.0)

## 2022-04-24 LAB — CBC WITH DIFFERENTIAL/PLATELET
Basophils Absolute: 0.1 10*3/uL (ref 0.0–0.1)
Basophils Relative: 1 % (ref 0.0–3.0)
Eosinophils Absolute: 0.4 10*3/uL (ref 0.0–0.7)
Eosinophils Relative: 5.7 % — ABNORMAL HIGH (ref 0.0–5.0)
HCT: 37.7 % (ref 36.0–46.0)
Hemoglobin: 12.4 g/dL (ref 12.0–15.0)
Lymphocytes Relative: 32.6 % (ref 12.0–46.0)
Lymphs Abs: 2.1 10*3/uL (ref 0.7–4.0)
MCHC: 32.8 g/dL (ref 30.0–36.0)
MCV: 86.1 fl (ref 78.0–100.0)
Monocytes Absolute: 0.6 10*3/uL (ref 0.1–1.0)
Monocytes Relative: 9.5 % (ref 3.0–12.0)
Neutro Abs: 3.2 10*3/uL (ref 1.4–7.7)
Neutrophils Relative %: 51.2 % (ref 43.0–77.0)
Platelets: 234 10*3/uL (ref 150.0–400.0)
RBC: 4.38 Mil/uL (ref 3.87–5.11)
RDW: 12.6 % (ref 11.5–15.5)
WBC: 6.3 10*3/uL (ref 4.0–10.5)

## 2022-04-24 LAB — COMPREHENSIVE METABOLIC PANEL
ALT: 13 U/L (ref 0–35)
AST: 15 U/L (ref 0–37)
Albumin: 4.1 g/dL (ref 3.5–5.2)
Alkaline Phosphatase: 81 U/L (ref 39–117)
BUN: 19 mg/dL (ref 6–23)
CO2: 27 mEq/L (ref 19–32)
Calcium: 9.4 mg/dL (ref 8.4–10.5)
Chloride: 106 mEq/L (ref 96–112)
Creatinine, Ser: 0.74 mg/dL (ref 0.40–1.20)
GFR: 85.59 mL/min (ref >60.00)
Glucose, Bld: 79 mg/dL (ref 70–99)
Potassium: 4.6 mEq/L (ref 3.5–5.1)
Sodium: 142 mEq/L (ref 135–145)
Total Bilirubin: 0.3 mg/dL (ref 0.2–1.2)
Total Protein: 6.9 g/dL (ref 6.0–8.3)

## 2022-04-24 LAB — T4, FREE: Free T4: 0.78 ng/dL (ref 0.60–1.60)

## 2022-04-24 LAB — SEDIMENTATION RATE: Sed Rate: 13 mm/hr (ref 0–30)

## 2022-04-24 LAB — TSH: TSH: 0.69 u[IU]/mL (ref 0.35–5.50)

## 2022-04-24 MED ORDER — KETOROLAC TROMETHAMINE 60 MG/2ML IM SOLN
60.0000 mg | Freq: Once | INTRAMUSCULAR | Status: AC
  Administered 2022-04-24: 60 mg via INTRAMUSCULAR

## 2022-04-24 MED ORDER — GLYCOPYRROLATE 2 MG PO TABS: 2.0000 mg | ORAL_TABLET | Freq: Three times a day (TID) | ORAL | 1 refills | Status: DC | PRN

## 2022-04-24 MED ORDER — LINACLOTIDE 145 MCG PO CAPS: 145.0000 ug | ORAL_CAPSULE | ORAL | 5 refills | Status: DC

## 2022-04-24 MED ORDER — HYDROCORTISONE ACETATE 25 MG RE SUPP: 25.0000 mg | Freq: Two times a day (BID) | RECTAL | 1 refills | Status: AC

## 2022-04-24 NOTE — Assessment & Plan Note (Signed)
Exam is ok Pelvis X ray Toradol IM now Start Rubinol forte Start Gluten free diet

## 2022-04-24 NOTE — Assessment & Plan Note (Signed)
Resolved

## 2022-04-24 NOTE — Patient Instructions (Signed)
  Gluten free trial for 4-6 weeks. OK to use gluten-free bread and gluten-free pasta.    Gluten-Free Diet for Celiac Disease, Adult The gluten-free diet includes all foods that do not contain gluten. Gluten is a protein that is found in wheat, rye, barley, and some other grains. Following the gluten-free diet is the only treatment for people with celiac disease. It helps to prevent damage to the intestines and improves or eliminates the symptoms of celiac disease. Following the gluten-free diet requires some planning. It can be challenging at first, but it gets easier with time and practice. There are more gluten-free options available today than ever before. If you need help finding gluten-free foods or if you have questions, talk with your diet and nutrition specialist (registered dietitian) or your health care provider. What do I need to know about a gluten-free diet?  All fruits, vegetables, and meats are safe to eat and do not contain gluten.  When grocery shopping, start by shopping in the produce, meat, and dairy sections. These sections are more likely to contain gluten-free foods. Then move to the aisles that contain packaged foods if you need to.  Read all food labels. Gluten is often added to foods. Always check the ingredient list and look for warnings, such as "may contain gluten."  Talk with your dietitian or health care provider before taking a gluten-free multivitamin or mineral supplement.  Be aware of gluten-free foods having contact with foods that contain gluten (cross-contamination). This can happen at home and with any processed foods. ? Talk with your health care provider or dietitian about how to reduce the risk of cross-contamination in your home. ? If you have questions about how a food is processed, ask the manufacturer. What key words help to identify gluten? Foods that list any of these key words on the label usually contain gluten:  Wheat, flour, enriched  flour, bromated flour, white flour, durum flour, graham flour, phosphated flour, self-rising flour, semolina, farina, barley (malt), rye, and oats.  Starch, dextrin, modified food starch, or cereal.  Thickening, fillers, or emulsifiers.  Malt flavoring, malt extract, or malt syrup.  Hydrolyzed vegetable protein.  In the [place] packaged foods that are gluten-free are required to be labeled "GF." These foods should be easy to identify and are safe to eat. In the [place] food companies are also required to list common food allergens, including wheat, on their labels. Recommended foods Grains  Amaranth, bean flours, 100% buckwheat flour, corn, millet, nut flours or nut meals, GF oats, quinoa, rice, sorghum, teff, rice wafers, pure cornmeal tortillas, popcorn, and hot cereals made from cornmeal. Hominy, rice, wild rice. Some Asian rice noodles or bean noodles. Arrowroot starch, corn bran, corn flour, corn germ, cornmeal, corn starch, potato flour, potato starch flour, and rice bran. Plain, brown, and sweet rice flours. Rice polish, soy flour, and tapioca starch. Vegetables  All plain fresh, frozen, and canned vegetables. Fruits  All plain fresh, frozen, canned, and dried fruits, and 100% fruit juices. Meats and other protein foods  All fresh beef, pork, poultry, fish, seafood, and eggs. Fish canned in water, oil, brine, or vegetable broth. Plain nuts and seeds, peanut butter. Some lunch meat and some frankfurters. Dried beans, dried peas, and lentils. Dairy  Fresh plain, dry, evaporated, or condensed milk. Cream, butter, sour cream, whipping cream, and most yogurts. Unprocessed cheese, most processed cheeses, some cottage cheese, some cream cheeses. Beverages  Coffee, tea, most herbal teas. Carbonated beverages and some root beers.   Wine, sake, and distilled spirits, such as gin, vodka, and whiskey. Most hard ciders. Fats and oils  Butter, margarine, vegetable oil, hydrogenated butter, olive  oil, shortening, lard, cream, and some mayonnaise. Some commercial salad dressings. Olives. Sweets and desserts  Sugar, honey, some syrups, molasses, jelly, and jam. Plain hard candy, marshmallows, and gumdrops. Pure cocoa powder. Plain chocolate. Custard and some pudding mixes. Gelatin desserts, sorbets, frozen ice pops, and sherbet. Cake, cookies, and other desserts prepared with allowed flours. Some commercial ice creams. Cornstarch, tapioca, and rice puddings. Seasoning and other foods  Some canned or frozen soups. Monosodium glutamate (MSG). Cider, rice, and wine vinegar. Baking soda and baking powder. Cream of tartar. Baking and nutritional yeast. Certain soy sauces made without wheat (ask your dietitian about specific brands that are allowed). Nuts, coconut, and chocolate. Salt, pepper, herbs, spices, flavoring extracts, imitation or artificial flavorings, natural flavorings, and food colorings. Some medicines and supplements. Some lip glosses and other cosmetics. Rice syrups. The items listed may not be a complete list. Talk with your dietitian about what dietary choices are best for you. Foods to avoid Grains  Barley, bran, bulgur, couscous, cracked wheat, [place], farro, graham, malt, matzo, semolina, wheat germ, and all wheat and rye cereals including spelt and kamut. Cereals containing malt as a flavoring, such as rice cereal. Noodles, spaghetti, macaroni, most packaged rice mixes, and all mixes containing wheat, rye, barley, or triticale. Vegetables  Most creamed vegetables and most vegetables canned in sauces. Some commercially prepared vegetables and salads. Fruits  Thickened or prepared fruits and some pie fillings. Some fruit snacks and fruit roll-ups. Meats and other protein foods  Any meat or meat alternative containing wheat, rye, barley, or gluten stabilizers. These are often marinated or packaged meats and lunch meats. Bread-containing products, such as Swiss steak,  croquettes, meatballs, and meatloaf. Most tuna canned in vegetable broth and turkey with hydrolyzed vegetable protein (HVP) injected as part of the basting. Seitan. Imitation fish. Eggs in sauces made from ingredients to avoid. Dairy  Commercial chocolate milk drinks and malted milk. Some non-dairy creamers. Any cheese product containing ingredients to avoid. Beverages  Certain cereal beverages. Beer, ale, malted milk, and some root beers. Some hard ciders. Some instant flavored coffees. Some herbal teas made with barley or with barley malt added. Fats and oils  Some commercial salad dressings. Sour cream containing modified food starch. Sweets and desserts  Some toffees. Chocolate-coated nuts (may be rolled in wheat flour) and some commercial candies and candy bars. Most cakes, cookies, donuts, pastries, and other baked goods. Some commercial ice cream. Ice cream cones. Commercially prepared mixes for cakes, cookies, and other desserts. Bread pudding and other puddings thickened with flour. Products containing brown rice syrup made with barley malt enzyme. Desserts and sweets made with malt flavoring. Seasoning and other foods  Some curry powders, some dry seasoning mixes, some gravy extracts, some meat sauces, some ketchups, some prepared mustards, and horseradish. Certain soy sauces. Malt vinegar. Bouillon and bouillon cubes that contain HVP. Some chip dips, and some chewing gum. Yeast extract. Brewer's yeast. Caramel color. Some medicines and supplements. Some lip glosses and other cosmetics. The items listed may not be a complete list. Talk with your dietitian about what dietary choices are best for you. Summary  Gluten is a protein that is found in wheat, rye, barley, and some other grains. The gluten-free diet includes all foods that do not contain gluten.  If you need help finding gluten-free foods or if   you have questions, talk with your diet and nutrition specialist (registered  dietitian) or your health care provider.  Read all food labels. Gluten is often added to foods. Always check the ingredient list and look for warnings, such as "may contain gluten." This information is not intended to replace advice given to you by your health care provider. Make sure you discuss any questions you have with your health care provider. Document Released: 08/13/2005 Document Revised: 05/28/2016 Document Reviewed: 05/28/2016 Elsevier Interactive Patient Education  2018 Else[employer] Inc[employer]   

## 2022-04-24 NOTE — Progress Notes (Signed)
Subjective:  Patient ID: Sheryl Lester, female    DOB: 16-Sep-1955  Age: 66 y.o. MRN: [id number]  CC: Follow-up   HPI Sheryl Lester presents for depression - worse, anxiety, constipation is back, RLQ abdominal pain is back. Cindy did not make a GI appt yet. In bed all day x weeks. Miralax worked... She is here w/Rick. C/o depression  Outpatient Medications Prior to Visit  Medication Sig Dispense Refill   buprenorphine (BUTRANS) 20 MCG/HR PTWK Place 20 mcg onto the skin every Tuesday.     Doxepin HCl 6 MG TABS Take 1 tablet by mouth at bedtime.     gabapentin (NEURONTIN) 600 MG tablet Take 1,200 mg by mouth 2 (two) times daily.     levothyroxine (SYNTHROID) 50 MCG tablet TAKE 1 TABLET BY MOUTH EVERY DAY 90 tablet 2   metoprolol tartrate (LOPRESSOR) 50 MG tablet TAKE 1 TABLET BY MOUTH TWICE A DAY 180 tablet 1   naloxone (NARCAN) nasal spray 4 mg/0.1 mL Place 1 spray into the nose as needed (for overdose).     NUCYNTA 100 MG TABS Take 100 mg by mouth as needed (facial pain).      ondansetron (ZOFRAN) 4 MG tablet Take 1 tablet (4 mg total) by mouth every 8 (eight) hours as needed for nausea or vomiting. 20 tablet 0   Suvorexant (BELSOMRA) 20 MG TABS Take 1 tablet by mouth at bedtime.     TRINTELLIX 20 MG TABS tablet Take 20 mg by mouth daily.     celecoxib (CELEBREX) 200 MG capsule Take 1 capsule (200 mg total) by mouth 2 (two) times daily. (Patient not taking: Reported on 04/24/2022) 10 capsule 0   Lactulose 20 GM/30ML SOLN Take 30-60 mLs (20-40 g total) by mouth 2 (two) times daily as needed (severe constipation). (Patient not taking: Reported on 04/24/2022) 450 mL 3   levofloxacin (LEVAQUIN) 500 MG tablet Take 1 tablet (500 mg total) by mouth daily. (Patient not taking: Reported on 04/24/2022) 10 tablet 0   No facility-administered medications prior to visit.    ROS: Review of Systems  Constitutional:  Negative for activity change, appetite change, chills, fatigue and unexpected  weight change.  HENT:  Negative for congestion, mouth sores and sinus pressure.   Eyes:  Negative for visual disturbance.  Respiratory:  Negative for cough and chest tightness.   Gastrointestinal:  Positive for abdominal pain, constipation and nausea.  Genitourinary:  Negative for difficulty urinating, frequency and vaginal pain.  Musculoskeletal:  Negative for back pain and gait problem.  Skin:  Negative for pallor and rash.  Neurological:  Positive for weakness. Negative for dizziness, tremors, numbness and headaches.  Psychiatric/Behavioral:  Positive for dysphoric mood. Negative for confusion, sleep disturbance and suicidal ideas. The patient is nervous/anxious.     Objective:  BP 138/86 (BP Location: Left Arm)   Pulse 68   Temp 97.7 F (36.5 C) (Oral)   Ht '5\' 7"'  (1.702 m)   Wt 151 lb 12.8 oz (68.9 kg)   SpO2 96%   BMI 23.78 kg/m   BP Readings from Last 3 Encounters:  04/24/22 138/86  03/26/22 128/78  03/22/22 (!) 137/93    Wt Readings from Last 3 Encounters:  04/24/22 151 lb 12.8 oz (68.9 kg)  03/26/22 150 lb (68 kg)  12/21/21 165 lb 6.4 oz (75 kg)    Physical Exam Constitutional:      General: She is not in acute distress.    Appearance: Normal appearance. She is  well-developed.  HENT:     Head: Normocephalic.     Right Ear: External ear normal.     Left Ear: External ear normal.     Nose: Nose normal.  Eyes:     General:        Right eye: No discharge.        Left eye: No discharge.     Conjunctiva/sclera: Conjunctivae normal.     Pupils: Pupils are equal, round, and reactive to light.  Neck:     Thyroid: No thyromegaly.     Vascular: No JVD.     Trachea: No tracheal deviation.  Cardiovascular:     Rate and Rhythm: Normal rate and regular rhythm.     Heart sounds: Normal heart sounds.  Pulmonary:     Effort: No respiratory distress.     Breath sounds: No stridor. No wheezing.  Abdominal:     General: Bowel sounds are normal. There is no distension.      Palpations: Abdomen is soft. There is no mass.     Tenderness: There is abdominal tenderness. There is no guarding or rebound.  Musculoskeletal:        General: No tenderness.     Cervical back: Normal range of motion and neck supple. No rigidity.  Lymphadenopathy:     Cervical: No cervical adenopathy.  Skin:    Findings: No erythema or rash.  Neurological:     Cranial Nerves: No cranial nerve deficit.     Motor: No abnormal muscle tone.     Coordination: Coordination normal.     Deep Tendon Reflexes: Reflexes normal.  Psychiatric:        Behavior: Behavior normal.        Thought Content: Thought content normal.        Judgment: Judgment normal.      A total time of 45 minutes was spent preparing to see the patient, reviewing tests, x-rays, operative reports and other medical records.  Also, obtaining history and performing comprehensive physical exam.  Additionally, counseling the patient regarding the above listed issues.   Finally, documenting clinical information in the health records, coordination of care, educating the patient - abd pain, depression. It is a complex case.  Lab Results  Component Value Date   WBC 6.4 03/22/2022   HGB 14.4 03/22/2022   HCT 44.1 03/22/2022   PLT 261 03/22/2022   GLUCOSE 86 03/22/2022   CHOL 202 (H) 09/22/2019   TRIG 78.0 09/22/2019   HDL 53.20 09/22/2019   LDLDIRECT 116.2 02/12/2013   LDLCALC 133 (H) 09/22/2019   ALT 19 03/22/2022   AST 24 03/22/2022   NA 138 03/22/2022   K 4.1 03/22/2022   CL 103 03/22/2022   CREATININE 0.96 03/22/2022   BUN 13 03/22/2022   CO2 27 03/22/2022   TSH 1.12 12/21/2021   INR 0.97 05/25/2018   HGBA1C 5.8 09/22/2019    No results found.  Assessment & Plan:   Problem List Items Addressed This Visit     B12 deficiency    On B12      Chronic idiopathic constipation    Start Linzess daily Schedule GI appt ASAP      Relevant Orders   CBC with Differential/Platelet   Comprehensive metabolic  panel   TSH   T4, free   Urinalysis   Sedimentation rate   Rectal pain, chronic    Anusol HC to try      RLQ abdominal pain - Primary  Worse Start Linzess daily Schedule GI appt ASAP Rubinul X ray LS spine, UA, CBC, ESR      Relevant Orders   CBC with Differential/Platelet   Comprehensive metabolic panel   TSH   T4, free   Urinalysis   Sedimentation rate   DG Pelvis 1-2 Views   RUQ abdominal pain    Exam is ok Pelvis X ray Toradol IM now Start Rubinol forte Start Gluten free diet       Weight loss    Resolved          Meds ordered this encounter  Medications   linaclotide (LINZESS) 145 MCG CAPS capsule    Sig: Take 1 capsule (145 mcg total) by mouth 1 day or 1 dose.    Dispense:  30 capsule    Refill:  5   hydrocortisone (ANUSOL-HC) 25 MG suppository    Sig: Place 1 suppository (25 mg total) rectally 2 (two) times daily.    Dispense:  20 suppository    Refill:  1   glycopyrrolate (ROBINUL-FORTE) 2 MG tablet    Sig: Take 1 tablet (2 mg total) by mouth 3 (three) times daily as needed.    Dispense:  90 tablet    Refill:  1   ketorolac (TORADOL) injection 60 mg      Follow-up: Return in about 4 weeks (around 05/22/2022) for a follow-up visit.  Walker Kehr, MD

## 2022-04-24 NOTE — Assessment & Plan Note (Signed)
Anusol HC to try

## 2022-04-24 NOTE — Assessment & Plan Note (Signed)
On B12 

## 2022-04-24 NOTE — Assessment & Plan Note (Signed)
Start Linzess daily Schedule GI appt ASAP

## 2022-04-24 NOTE — Telephone Encounter (Signed)
Pt scheduled to see Alonza Bogus PA 05/08/22 at 1:30pm. Pts husband aware of appt.

## 2022-04-24 NOTE — Telephone Encounter (Signed)
Patient's husband called on patient's behalf.  She is having severe constipation, for which she has been taking Miralax, and pain in her right hip/abdomen that "is excruciating and unbearable."  She went to see Dr. Alain Marion this morning and he referred her here.  Dr. Blanch Media first appointment (NPOV) is 10/10 at 2:20 p.m.  There are currently no APP appointments through the end of September.  Patient is asking to be seen sooner rather than later or told what she can do in the interim.  Please call patient on her home phone and advise.  If she cannot be reached on her home phone, please call husband's cell phone.  Thank you.

## 2022-04-24 NOTE — Assessment & Plan Note (Signed)
Worse Start Linzess daily Schedule GI appt ASAP Rubinul X ray LS spine, UA, CBC, ESR

## 2022-05-01 ENCOUNTER — Telehealth: Payer: Self-pay | Admitting: Physician Assistant

## 2022-05-01 NOTE — Telephone Encounter (Signed)
Called the patient. No answer. Voicemail is full.

## 2022-05-01 NOTE — Telephone Encounter (Signed)
Patient called, states she is in pain would like to talk to a nurse. Please call to advise.

## 2022-05-02 ENCOUNTER — Telehealth: Payer: Self-pay | Admitting: *Deleted

## 2022-05-02 NOTE — Telephone Encounter (Signed)
Rec'd fax determination med was DENIED It states " Plan benefit requires an adequate trial or documentation of two or more laxative therapy, and trulance. Per chart pt has appt w/ GI 05/08/22

## 2022-05-02 NOTE — Telephone Encounter (Signed)
Rec'd fax pt need PA on Linzess. Tried to submit PA received msg To initiate a request for this medication, please contact [place] at [phone].

## 2022-05-08 ENCOUNTER — Ambulatory Visit (INDEPENDENT_AMBULATORY_CARE_PROVIDER_SITE_OTHER): Payer: PRIVATE HEALTH INSURANCE | Admitting: Gastroenterology

## 2022-05-08 ENCOUNTER — Encounter: Payer: Self-pay | Admitting: Gastroenterology

## 2022-05-08 VITALS — BP 124/82 | HR 60 | Ht 67.0 in | Wt 160.0 lb

## 2022-05-08 DIAGNOSIS — R109 Unspecified abdominal pain: Secondary | ICD-10-CM | POA: Diagnosis not present

## 2022-05-08 DIAGNOSIS — K5900 Constipation, unspecified: Secondary | ICD-10-CM

## 2022-05-08 DIAGNOSIS — R194 Change in bowel habit: Secondary | ICD-10-CM

## 2022-05-08 MED ORDER — NA SULFATE-K SULFATE-MG SULF 17.5-3.13-1.6 GM/177ML PO SOLN: 1.0000 | Freq: Once | ORAL | 0 refills | Status: AC

## 2022-05-08 NOTE — Patient Instructions (Addendum)
Sheryl Lester recommends that you complete a bowel purge (to clean out your bowels). Please do the following: Purchase a bottle of Miralax over the counter as well as a box of 5 mg dulcolax tablets. Take 4 dulcolax tablets. Wait 1 hour. You will then drink 6-8 capfuls of Miralax mixed in an adequate amount of water/juice/gatorade (you may choose which of these liquids to drink) over the next 2-3 hours. You should expect results within 1 to 6 hours after completing the bowel purge.  Start Symporic tomorrow after completing bowel purge.  You have been scheduled for a colonoscopy. Please follow written instructions given to you at your visit today.  Please pick up your prep supplies at the pharmacy within the next 1-3 days. If you use inhalers (even only as needed), please bring them with you on the day of your procedure.  _______________________________________________________  If you are age 41 or older, your body mass index should be between 23-30. Your Body mass index is 25.06 kg/m. If this is out of the aforementioned range listed, please consider follow up with your Primary Care Provider.  If you are age 64 or younger, your body mass index should be between 19-25. Your Body mass index is 25.06 kg/m. If this is out of the aformentioned range listed, please consider follow up with your Primary Care Provider.   ________________________________________________________  The [facility] providers would like to encourage you to use [facility] to communicate with providers for non-urgent requests or questions.  Due to long hold times on the telephone, sending your provider a message by [facility] may be a faster and more efficient way to get a response.  Please allow 48 business hours for a response.  Please remember that this is for non-urgent requests.  _______________________________________________________

## 2022-05-08 NOTE — Progress Notes (Signed)
05/08/2022 ALIS SAWCHUK [id number] 1956-03-17   HISTORY OF PRESENT ILLNESS: This is a 66 year old female with a patient of Dr. Blanch Media.  She is here with complaints of change in bowel habits with worsening constipation and right-sided abdominal pain.  This abdominal pain has been present at least in some form dating back to 2019.  She was seen here in 2019 at which time a CT scan was unrevealing and we suggested colonoscopy as her last was in 2011.  That was not performed, however.  Now she is here today complaining of right-sided abdominal pain.  She had a CT scan again back in December 2022 for complaints of pain and that was unremarkable.  Says that she has also had a change in her bowel habits.  She has been on chronic pain medication for a long time, but says she is never suffered with constipation.  Recently she been having a lot of constipation and stools are coming out in thin and narrow pieces.  She says that she had lactulose that was given to her by her PCP and that really seemed to help, but then she discontinued it and did not go back to it.  She says that MiraLAX did not help.  She says that Relastor did not help.  She says that she was not able to get Linzess with her insurance.  She says that her pain management doctor gave her a prescription for Symproic and she did pick that up at the pharmacy, but has not started it yet.  Last colonoscopy was in 2011 and was normal.  She is status post cholecystectomy, appendectomy hysterectomy and BSO.  Also with history of peripheral vascular disease, hypertension, migraines, bipolar disorder and borderline. She had EGD in January 2014 with finding of GERD with mild esophagitis.  Referred here on this occasion by her PCP, Dr. Alain Marion.  Past Medical History:  Diagnosis Date   ALLERGIC RHINITIS 01/20/2008   ANEMIA-IRON DEFICIENCY 01/20/2008   ANXIETY 01/20/2008   Arthritis    ASTHMATIC BRONCHITIS, ACUTE 05/19/2008   Bipolar disorder  ([place])    CENTRAL SLEEP APNEA CONDS CLASSIFIED ELSEWHERE 08/28/2480   Complication of anesthesia 1987   "w/one of the anesthesia agents that they don't use anymore; chest muscles constricted; felt like I couldn't breath"   DEPRESSION 01/20/2008   DYSPHAGIA UNSPECIFIED 12/06/2008   ELEVATED BLOOD PRESSURE WITHOUT DIAGNOSIS OF HYPERTENSION 12/06/2008   GERD    History of sleep walking    HYPERLIPIDEMIA 01/20/2008   HYPERTENSION 12/16/2008   Hypothyroidism    Melanoma ([place]) 2012   "skin of my stomach"   Migraine    "related to menses; went away after hysterectomy"   OSA treated with BiPAP    OTITIS MEDIA, ACUTE, BILATERAL 12/16/2008   OTITIS MEDIA, SEROUS, CHRONIC 12/30/2008   PVD 05/12/2010   Pyelonephritis "hospitalized in ~ 1991"   SINUSITIS- ACUTE-NOS 09/21/2008   SWELLING MASS OR LUMP IN HEAD AND NECK 09/21/2008   THYROID NODULE, LEFT    "unable to aspirate; has gotten very small"    Trigeminal neuralgia 01/20/2008   right side o face   Unspecified hearing loss 09/21/2008   VENOUS INSUFFICIENCY, LEGS 05/03/2010   Past Surgical History:  Procedure Laterality Date   ABDOMINAL HYSTERECTOMY  2000   APPENDECTOMY  2000   BRAIN SURGERY     BREAST BIOPSY Left 1994   Benign   CEREBRAL [place]   for chronic facial pain/ [facility]   CHOLECYSTECTOMY  N/A 12/13/2015   Procedure: LAPAROSCOPIC CHOLECYSTECTOMY;  Surgeon: Arta Bruce Kinsinger, MD;  Location: [facility];  Service: General;  Laterality: N/A;   COLONOSCOPY     x 2   INGUINAL HERNIA REPAIR Right 1987   MELANOMA EXCISION  2015   "skin of my stomach"   OOPHORECTOMY Bilateral 2000   OPEN REDUCTION INTERNAL FIXATION (ORIF) DISTAL RADIAL FRACTURE Right 10/01/2018   Procedure: OPEN REDUCTION INTERNAL FIXATION (ORIF) DISTAL RADIAL FRACTURE;  Surgeon: Iran Planas, MD;  Location: [place];  Service: Orthopedics;  Laterality: Right;  75 minutes    reports that she has never smoked. She has never used smokeless tobacco. She reports that  she does not drink alcohol and does not use drugs. family history includes Anxiety disorder in her brother, father, sister, and sister; Breast cancer in her paternal aunt; Cancer - Cervical in her mother; Depression in her brother, father, sister, and sister; Diabetes in her father; Heart disease in her paternal grandmother and another family member; Hyperlipidemia in her mother; Hypertension in her mother; Melanoma in her father and sister. Allergies  Allergen Reactions   Aripiprazole Swelling and Rash    *Abilify    Fentanyl     Other reaction(s): Other Sleep Walking   Augmentin [Amoxicillin-Pot Clavulanate]     Diarrhea. She can take amoxicillin   Avapro [Irbesartan] Other (See Comments)    hyponatremia   Niacin And Related     swelling   Other Swelling    swelling   Sulfamethoxazole-Trimethoprim Nausea And Vomiting   Bupropion Rash    Wellbutrin    Ibuprofen Nausea And Vomiting      Outpatient Encounter Medications as of 05/08/2022  Medication Sig   buprenorphine (BUTRANS) 20 MCG/HR PTWK Place 20 mcg onto the skin every Tuesday.   gabapentin (NEURONTIN) 600 MG tablet Take 1,800 mg by mouth 2 (two) times daily.   glycopyrrolate (ROBINUL-FORTE) 2 MG tablet Take 1 tablet (2 mg total) by mouth 3 (three) times daily as needed.   levothyroxine (SYNTHROID) 50 MCG tablet TAKE 1 TABLET BY MOUTH EVERY DAY   metoprolol tartrate (LOPRESSOR) 50 MG tablet TAKE 1 TABLET BY MOUTH TWICE A DAY   naloxone (NARCAN) nasal spray 4 mg/0.1 mL Place 1 spray into the nose as needed (for overdose).   NUCYNTA 100 MG TABS Take 100 mg by mouth as needed (facial pain).    TRINTELLIX 20 MG TABS tablet Take 25 mg by mouth daily.   Doxepin HCl 6 MG TABS Take 1 tablet by mouth at bedtime. (Patient not taking: Reported on 05/08/2022)   hydrocortisone (ANUSOL-HC) 25 MG suppository Place 1 suppository (25 mg total) rectally 2 (two) times daily. (Patient not taking: Reported on 05/08/2022)   linaclotide (LINZESS) 145  MCG CAPS capsule Take 1 capsule (145 mcg total) by mouth 1 day or 1 dose. (Patient not taking: Reported on 05/08/2022)   ondansetron (ZOFRAN) 4 MG tablet Take 1 tablet (4 mg total) by mouth every 8 (eight) hours as needed for nausea or vomiting. (Patient not taking: Reported on 05/08/2022)   Suvorexant (BELSOMRA) 20 MG TABS Take 1 tablet by mouth at bedtime. (Patient not taking: Reported on 05/08/2022)   No facility-administered encounter medications on file as of 05/08/2022.     REVIEW OF SYSTEMS  : All other systems reviewed and negative except where noted in the History of Present Illness.   PHYSICAL EXAM: BP 124/82   Pulse 60   Ht '5\' 7"'$  (1.702 m)   Wt 160  lb (72.6 kg)   BMI 25.06 kg/m  General: Well developed white female in no acute distress Head: Normocephalic and atraumatic Eyes:  Sclerae anicteric, conjunctiva pink. Ears: Normal auditory acuity Lungs: Clear throughout to auscultation; no W/R/R. Heart: Regular rate and rhythm; no M/R/G. Abdomen: Soft, non-distended.  BS present.  Diffuse TTP>on the right. Rectal:  Will be done at the time of colonoscopy. Musculoskeletal: Symmetrical with no gross deformities  Skin: No lesions on visible extremities Extremities: No edema  Neurological: Alert oriented x 4, grossly non-focal. Psychological:  Alert and cooperative. Normal mood and affect  ASSESSMENT AND PLAN: *Constipation: She is on chronic narcotic/opioid pain medications, but says that she never had a issue with constipation in the past.  Says that this is a change for her.  She does not drink much water or eat many fruits and vegetables.  We discussed how this is usually a motility issue and she could increase this things in her diet.  She also likely needs more activity.  This can also worsen with age, etc.  Nonetheless, we will plan for colonoscopy with Dr. Henrene Pastor.  She is going have a 2-day bowel prep.  This evening she is going to do a MiraLAX purge and then she was given a  prescription for Symproic by her pain management doctor so she will start that tomorrow.  The risks, benefits, and alternatives to colonoscopy were discussed with the patient and she consents to proceed.  *Right-sided abdominal pain: At the this point my suspicion is that secondary to adhesions.  She had this complaint back in 2019 at which time CT scan was unremarkable and we suggested a colonoscopy, but that was never performed.  CT scan in December 2022 was again unremarkable.   CC:  Plotnikov, Evie Lacks, MD

## 2022-05-09 NOTE — Progress Notes (Signed)
Noted  

## 2022-05-17 ENCOUNTER — Other Ambulatory Visit: Payer: Self-pay | Admitting: Internal Medicine

## 2022-05-24 ENCOUNTER — Emergency Department (HOSPITAL_COMMUNITY): Payer: 59

## 2022-05-24 ENCOUNTER — Encounter (HOSPITAL_COMMUNITY): Payer: Self-pay

## 2022-05-24 ENCOUNTER — Encounter: Payer: Self-pay | Admitting: Internal Medicine

## 2022-05-24 ENCOUNTER — Ambulatory Visit (AMBULATORY_SURGERY_CENTER): Payer: PRIVATE HEALTH INSURANCE | Admitting: Internal Medicine

## 2022-05-24 ENCOUNTER — Emergency Department (HOSPITAL_COMMUNITY)
Admission: EM | Admit: 2022-05-24 | Discharge: 2022-05-24 | Disposition: A | Discharge status: Home / Self Care | Payer: 59 | Attending: Emergency Medicine | Admitting: Emergency Medicine

## 2022-05-24 VITALS — BP 165/91 | HR 67 | Temp 96.6°F | Resp 16 | Ht 67.0 in | Wt 160.0 lb

## 2022-05-24 DIAGNOSIS — R1031 Right lower quadrant pain: Secondary | ICD-10-CM | POA: Insufficient documentation

## 2022-05-24 DIAGNOSIS — Z1211 Encounter for screening for malignant neoplasm of colon: Secondary | ICD-10-CM

## 2022-05-24 DIAGNOSIS — K5900 Constipation, unspecified: Secondary | ICD-10-CM

## 2022-05-24 DIAGNOSIS — G500 Trigeminal neuralgia: Secondary | ICD-10-CM | POA: Diagnosis present

## 2022-05-24 DIAGNOSIS — R194 Change in bowel habit: Secondary | ICD-10-CM

## 2022-05-24 DIAGNOSIS — R109 Unspecified abdominal pain: Secondary | ICD-10-CM

## 2022-05-24 LAB — CBC WITH DIFFERENTIAL/PLATELET
Abs Immature Granulocytes: 0.01 10*3/uL (ref 0.00–0.07)
Basophils Absolute: 0.1 10*3/uL (ref 0.0–0.1)
Basophils Relative: 1 %
Eosinophils Absolute: 0.2 10*3/uL (ref 0.0–0.5)
Eosinophils Relative: 2 %
HCT: 43.6 % (ref 36.0–46.0)
Hemoglobin: 14.1 g/dL (ref 12.0–15.0)
Immature Granulocytes: 0 %
Lymphocytes Relative: 18 %
Lymphs Abs: 1.8 10*3/uL (ref 0.7–4.0)
MCH: 28.1 pg (ref 26.0–34.0)
MCHC: 32.3 g/dL (ref 30.0–36.0)
MCV: 87 fL (ref 80.0–100.0)
Monocytes Absolute: 0.5 10*3/uL (ref 0.1–1.0)
Monocytes Relative: 6 %
Neutro Abs: 7 10*3/uL (ref 1.7–7.7)
Neutrophils Relative %: 73 %
Platelets: 241 10*3/uL (ref 150–400)
RBC: 5.01 MIL/uL (ref 3.87–5.11)
RDW: 11.9 % (ref 11.5–15.5)
WBC: 9.6 10*3/uL (ref 4.0–10.5)
nRBC: 0 % (ref 0.0–0.2)

## 2022-05-24 LAB — COMPREHENSIVE METABOLIC PANEL
ALT: 10 U/L (ref 0–44)
AST: 17 U/L (ref 15–41)
Albumin: 4.3 g/dL (ref 3.5–5.0)
Alkaline Phosphatase: 72 U/L (ref 38–126)
Anion gap: 10 (ref 5–15)
BUN: 10 mg/dL (ref 8–23)
CO2: 22 mmol/L (ref 22–32)
Calcium: 9.1 mg/dL (ref 8.9–10.3)
Chloride: 104 mmol/L (ref 98–111)
Creatinine, Ser: 0.75 mg/dL (ref 0.44–1.00)
GFR, Estimated: >60 mL/min (ref >60)
Glucose, Bld: 92 mg/dL (ref 70–99)
Potassium: 3.2 mmol/L — ABNORMAL LOW (ref 3.5–5.1)
Sodium: 136 mmol/L (ref 135–145)
Total Bilirubin: 0.8 mg/dL (ref 0.3–1.2)
Total Protein: 7.6 g/dL (ref 6.5–8.1)

## 2022-05-24 LAB — URINALYSIS, ROUTINE W REFLEX MICROSCOPIC
Bacteria, UA: NONE SEEN
Bilirubin Urine: NEGATIVE
Glucose, UA: NEGATIVE mg/dL
Hgb urine dipstick: NEGATIVE
Ketones, ur: 5 mg/dL — AB
Leukocytes,Ua: NEGATIVE
Nitrite: NEGATIVE
Protein, ur: NEGATIVE mg/dL
Specific Gravity, Urine: 1.019 (ref 1.005–1.030)
pH: 6 (ref 5.0–8.0)

## 2022-05-24 LAB — LIPASE, BLOOD: Lipase: 28 U/L (ref 11–51)

## 2022-05-24 MED ORDER — ACETAMINOPHEN 500 MG PO TABS
1000.0000 mg | ORAL_TABLET | Freq: Once | ORAL | Status: AC
  Administered 2022-05-24: 1000 mg via ORAL
  Filled 2022-05-24: qty 2

## 2022-05-24 MED ORDER — IOHEXOL 300 MG/ML  SOLN: 100.0000 mL | Freq: Once | INTRAMUSCULAR | Status: DC | PRN

## 2022-05-24 MED ORDER — SODIUM CHLORIDE (PF) 0.9 % IJ SOLN
INTRAMUSCULAR | Status: AC
  Filled 2022-05-24: qty 50

## 2022-05-24 MED ORDER — SODIUM CHLORIDE 0.9 % IV SOLN: 500.0000 mL | INTRAVENOUS | Status: DC

## 2022-05-24 MED ORDER — KETAMINE HCL 50 MG/5ML IJ SOSY
10.0000 mg | PREFILLED_SYRINGE | Freq: Once | INTRAMUSCULAR | Status: AC
  Administered 2022-05-24: 10 mg via INTRAVENOUS
  Filled 2022-05-24: qty 5

## 2022-05-24 MED ORDER — KETOROLAC TROMETHAMINE 15 MG/ML IJ SOLN
15.0000 mg | Freq: Once | INTRAMUSCULAR | Status: AC
  Administered 2022-05-24: 15 mg via INTRAVENOUS
  Filled 2022-05-24: qty 1

## 2022-05-24 MED ORDER — IOHEXOL 300 MG/ML  SOLN
100.0000 mL | Freq: Once | INTRAMUSCULAR | Status: AC | PRN
  Administered 2022-05-24: 100 mL via INTRAVENOUS

## 2022-05-24 NOTE — ED Triage Notes (Signed)
Pt states she has been having ongoing pain in her face and RLQ abd for quite some time. Pt is seen at pain clinic for same. Had outpatient colonoscopy this morning due to chronic pain and has not had her pain medicine today. Denies any N/V/D, urinary symptoms, bleeding.

## 2022-05-24 NOTE — ED Provider Triage Note (Signed)
Emergency Medicine Provider Triage Evaluation Note  TRENIYAH LYNN , a 66 y.o. female  was evaluated in triage.  Pt complains of abd pain. Pain to RLQ after colonoscopy this AM.  Hx of chronic pain.  Has had recurrent lower abd pain from interstitial cystitis as well as R ear.    Review of Systems  Positive: As above Negative: As above  Physical Exam  BP (!) 167/109 (BP Location: Left Arm)   Pulse 82   Temp 97.9 F (36.6 C) (Oral)   Resp (!) 24   Ht '5\' 7"'$  (1.702 m)   Wt 66.7 kg   SpO2 98%   BMI 23.02 kg/m  Gen:   Awake, no distress   Resp:  Normal effort  MSK:   Moves extremities without difficulty  Other:    Medical Decision Making  Medically screening exam initiated at 10:07 AM.  Appropriate orders placed.  JOELLE ROSWELL was informed that the remainder of the evaluation will be completed by another provider, this initial triage assessment does not replace that evaluation, and the importance of remaining in the ED until their evaluation is complete.     Domenic Moras, PA-C 05/24/22 1009

## 2022-05-24 NOTE — Progress Notes (Signed)
Patient is complaining of 10 out of 10 pain to right abdominal area. Colonoscopy was normal, nothing was found to explain patient's pain. When time to get discharged, patient refused to get dressed and stated they wanted to stay in the facility and they weren't going anywhere. Patient's husband said they will be taking her over to the emergency room because she "can't go on like this anymore with this amount of pain."  Patient's husband called an uber to come pick the patient and himself up to take them to the hospital. The RN and nurse tech helped to get the patient dressed and put the patient in the wheelchair to be escorted out to [place] driver with patient's husband. Patient's pain patch is still located on patient's upper back on discharge from facility.

## 2022-05-24 NOTE — ED Notes (Signed)
Pt stuck x2 by this RN. Attempted x1 w/ [place] machine by triage RN.

## 2022-05-24 NOTE — Discharge Instructions (Signed)
Follow-up with your pain clinic tomorrow.  Feel free to return anytime he would like to be reevaluated in our emergency department.

## 2022-05-24 NOTE — Progress Notes (Signed)
05/08/2022 Sheryl Lester [id number] 1956/02/10     HISTORY OF PRESENT ILLNESS: This is a 66 year old female with a patient of Dr. Blanch Media.  She is here with complaints of change in bowel habits with worsening constipation and right-sided abdominal pain.  This abdominal pain has been present at least in some form dating back to 2019.  She was seen here in 2019 at which time a CT scan was unrevealing and we suggested colonoscopy as her last was in 2011.  That was not performed, however.  Now she is here today complaining of right-sided abdominal pain.  She had a CT scan again back in December 2022 for complaints of pain and that was unremarkable.  Says that she has also had a change in her bowel habits.  She has been on chronic pain medication for a long time, but says she is never suffered with constipation.  Recently she been having a lot of constipation and stools are coming out in thin and narrow pieces.  She says that she had lactulose that was given to her by her PCP and that really seemed to help, but then she discontinued it and did not go back to it.  She says that MiraLAX did not help.  She says that Relastor did not help.  She says that she was not able to get Linzess with her insurance.  She says that her pain management doctor gave her a prescription for Symproic and she did pick that up at the pharmacy, but has not started it yet.   Last colonoscopy was in 2011 and was normal.  She is status post cholecystectomy, appendectomy hysterectomy and BSO.  Also with history of peripheral vascular disease, hypertension, migraines, bipolar disorder and borderline. She had EGD in January 2014 with finding of GERD with mild esophagitis.   Referred here on this occasion by her PCP, Dr. Alain Marion.       Past Medical History:  Diagnosis Date   ALLERGIC RHINITIS 01/20/2008   ANEMIA-IRON DEFICIENCY 01/20/2008   ANXIETY 01/20/2008   Arthritis     ASTHMATIC BRONCHITIS, ACUTE 05/19/2008   Bipolar disorder  ([place])     CENTRAL SLEEP APNEA CONDS CLASSIFIED ELSEWHERE 01/01/8501   Complication of anesthesia 1987    "w/one of the anesthesia agents that they don't use anymore; chest muscles constricted; felt like I couldn't breath"   DEPRESSION 01/20/2008   DYSPHAGIA UNSPECIFIED 12/06/2008   ELEVATED BLOOD PRESSURE WITHOUT DIAGNOSIS OF HYPERTENSION 12/06/2008   GERD     History of sleep walking     HYPERLIPIDEMIA 01/20/2008   HYPERTENSION 12/16/2008   Hypothyroidism     Melanoma ([place]) 2012    "skin of my stomach"   Migraine      "related to menses; went away after hysterectomy"   OSA treated with BiPAP     OTITIS MEDIA, ACUTE, BILATERAL 12/16/2008   OTITIS MEDIA, SEROUS, CHRONIC 12/30/2008   PVD 05/12/2010   Pyelonephritis "hospitalized in ~ 1991"   SINUSITIS- ACUTE-NOS 09/21/2008   SWELLING MASS OR LUMP IN HEAD AND NECK 09/21/2008   THYROID NODULE, LEFT      "unable to aspirate; has gotten very small"    Trigeminal neuralgia 01/20/2008    right side o face   Unspecified hearing loss 09/21/2008   VENOUS INSUFFICIENCY, LEGS 05/03/2010         Past Surgical History:  Procedure Laterality Date   ABDOMINAL HYSTERECTOMY   2000   APPENDECTOMY   2000   BRAIN SURGERY  BREAST BIOPSY Left 1994    Benign   CEREBRAL MICROVASCULAR DECOMPRESSION   1999    for chronic facial pain/ [facility]   CHOLECYSTECTOMY N/A 12/13/2015    Procedure: LAPAROSCOPIC CHOLECYSTECTOMY;  Surgeon: Arta Bruce Kinsinger, MD;  Location: [facility];  Service: General;  Laterality: N/A;   COLONOSCOPY        x 2   INGUINAL HERNIA REPAIR Right 1987   MELANOMA EXCISION   2015    "skin of my stomach"   OOPHORECTOMY Bilateral 2000   OPEN REDUCTION INTERNAL FIXATION (ORIF) DISTAL RADIAL FRACTURE Right 10/01/2018    Procedure: OPEN REDUCTION INTERNAL FIXATION (ORIF) DISTAL RADIAL FRACTURE;  Surgeon: Iran Planas, MD;  Location: [place];  Service: Orthopedics;  Laterality: Right;  75 minutes     reports that she has never smoked. She has never used  smokeless tobacco. She reports that she does not drink alcohol and does not use drugs. family history includes Anxiety disorder in her brother, father, sister, and sister; Breast cancer in her paternal aunt; Cancer - Cervical in her mother; Depression in her brother, father, sister, and sister; Diabetes in her father; Heart disease in her paternal grandmother and another family member; Hyperlipidemia in her mother; Hypertension in her mother; Melanoma in her father and sister.      Allergies  Allergen Reactions   Aripiprazole Swelling and Rash      *Abilify    Fentanyl        Other reaction(s): Other Sleep Walking   Augmentin [Amoxicillin-Pot Clavulanate]        Diarrhea. She can take amoxicillin   Avapro [Irbesartan] Other (See Comments)      hyponatremia   Niacin And Related        swelling   Other Swelling      swelling   Sulfamethoxazole-Trimethoprim Nausea And Vomiting   Bupropion Rash      Wellbutrin    Ibuprofen Nausea And Vomiting            Outpatient Encounter Medications as of 05/08/2022  Medication Sig   buprenorphine (BUTRANS) 20 MCG/HR PTWK Place 20 mcg onto the skin every Tuesday.   gabapentin (NEURONTIN) 600 MG tablet Take 1,800 mg by mouth 2 (two) times daily.   glycopyrrolate (ROBINUL-FORTE) 2 MG tablet Take 1 tablet (2 mg total) by mouth 3 (three) times daily as needed.   levothyroxine (SYNTHROID) 50 MCG tablet TAKE 1 TABLET BY MOUTH EVERY DAY   metoprolol tartrate (LOPRESSOR) 50 MG tablet TAKE 1 TABLET BY MOUTH TWICE A DAY   naloxone (NARCAN) nasal spray 4 mg/0.1 mL Place 1 spray into the nose as needed (for overdose).   NUCYNTA 100 MG TABS Take 100 mg by mouth as needed (facial pain).    TRINTELLIX 20 MG TABS tablet Take 25 mg by mouth daily.   Doxepin HCl 6 MG TABS Take 1 tablet by mouth at bedtime. (Patient not taking: Reported on 05/08/2022)   hydrocortisone (ANUSOL-HC) 25 MG suppository Place 1 suppository (25 mg total) rectally 2 (two) times daily.  (Patient not taking: Reported on 05/08/2022)   linaclotide (LINZESS) 145 MCG CAPS capsule Take 1 capsule (145 mcg total) by mouth 1 day or 1 dose. (Patient not taking: Reported on 05/08/2022)   ondansetron (ZOFRAN) 4 MG tablet Take 1 tablet (4 mg total) by mouth every 8 (eight) hours as needed for nausea or vomiting. (Patient not taking: Reported on 05/08/2022)   Suvorexant (BELSOMRA) 20 MG TABS Take 1 tablet by mouth at  bedtime. (Patient not taking: Reported on 05/08/2022)    No facility-administered encounter medications on file as of 05/08/2022.        REVIEW OF SYSTEMS  : All other systems reviewed and negative except where noted in the History of Present Illness.     PHYSICAL EXAM: BP 124/82   Pulse 60   Ht '5\' 7"'$  (1.702 m)   Wt 160 lb (72.6 kg)   BMI 25.06 kg/m  General: Well developed white female in no acute distress Head: Normocephalic and atraumatic Eyes:  Sclerae anicteric, conjunctiva pink. Ears: Normal auditory acuity Lungs: Clear throughout to auscultation; no W/R/R. Heart: Regular rate and rhythm; no M/R/G. Abdomen: Soft, non-distended.  BS present.  Diffuse TTP>on the right. Rectal:  Will be done at the time of colonoscopy. Musculoskeletal: Symmetrical with no gross deformities  Skin: No lesions on visible extremities Extremities: No edema  Neurological: Alert oriented x 4, grossly non-focal. Psychological:  Alert and cooperative. Normal mood and affect   ASSESSMENT AND PLAN: *Constipation: She is on chronic narcotic/opioid pain medications, but says that she never had a issue with constipation in the past.  Says that this is a change for her.  She does not drink much water or eat many fruits and vegetables.  We discussed how this is usually a motility issue and she could increase this things in her diet.  She also likely needs more activity.  This can also worsen with age, etc.  Nonetheless, we will plan for colonoscopy with Dr. Henrene Pastor.  She is going have a 2-day bowel  prep.  This evening she is going to do a MiraLAX purge and then she was given a prescription for Symproic by her pain management doctor so she will start that tomorrow.  The risks, benefits, and alternatives to colonoscopy were discussed with the patient and she consents to proceed.  *Right-sided abdominal pain: At the this point my suspicion is that secondary to adhesions.  She had this complaint back in 2019 at which time CT scan was unremarkable and we suggested a colonoscopy, but that was never performed.  CT scan in December 2022 was again unremarkable.   ADDENDUM: Recent consultation note as above.  Patient completed her prep.  Prior to the procedure she is screaming in agony complaining of right lower quadrant pain.  Physical examination shows a soft abdomen.  There is marked skin discoloration in the right lower quadrant due to chronic application of heating pad.  Also, small area of superficial disruption of skin in that area.  Is been 12 years since her last colonoscopy.  She is now for repeat colon cancer screening and assessment of the above complaints.

## 2022-05-24 NOTE — Progress Notes (Signed)
Nad vss trans to pacu 

## 2022-05-24 NOTE — ED Provider Notes (Signed)
[place] DEPT Provider Note   CSN: [id number] Arrival date & time: 05/24/22  0932     History  Chief Complaint  Patient presents with   Abdominal Pain    Sheryl Lester is a 66 y.o. female.  66 yo F with a chief complaint of right-sided abdominal pain.  Is been going on for months.  She typically controls her pain with her pain management regimen for her trigeminal neuralgia.  She thought that it is gotten progressively worse and so has been seen by a gastroenterologist.  She had a colonoscopy today and when she was told that her report was clean she told them that her pain had become unrelenting and she needed immediate transport to the emergency department.  When the gastroenterologist declined to call 911 she took an Melburn Popper here to be evaluated.   Abdominal Pain      Home Medications Prior to Admission medications   Medication Sig Start Date End Date Taking? Authorizing Provider  buprenorphine (BUTRANS) 20 MCG/HR PTWK Place 20 mcg onto the skin every Tuesday.    [provider]  diphenhydramine-acetaminophen (TYLENOL PM) 25-500 MG TABS tablet Take 1 tablet by mouth at bedtime as needed.    [provider]  Doxepin HCl 6 MG TABS Take 1 tablet by mouth at bedtime. Patient not taking: Reported on 05/08/2022 03/02/22   [provider]  gabapentin (NEURONTIN) 600 MG tablet Take 1,800 mg by mouth 2 (two) times daily.    [provider]  glycopyrrolate (ROBINUL) 2 MG tablet TAKE 1 TABLET BY MOUTH 3 TIMES DAILY AS NEEDED. 05/17/22   Plotnikov, Evie Lacks, MD  hydrocortisone (ANUSOL-HC) 25 MG suppository Place 1 suppository (25 mg total) rectally 2 (two) times daily. Patient not taking: Reported on 05/08/2022 04/24/22 04/24/23  Plotnikov, Evie Lacks, MD  levothyroxine (SYNTHROID) 50 MCG tablet TAKE 1 TABLET BY MOUTH EVERY DAY 04/23/22   Plotnikov, Evie Lacks, MD  linaclotide (LINZESS) 145 MCG CAPS capsule Take 1 capsule (145  mcg total) by mouth 1 day or 1 dose. Patient not taking: Reported on 05/08/2022 04/24/22   Plotnikov, Evie Lacks, MD  lubiprostone (AMITIZA) 8 MCG capsule Take 8 mcg by mouth 2 (two) times daily. 05/01/22   [provider]  metoprolol tartrate (LOPRESSOR) 50 MG tablet TAKE 1 TABLET BY MOUTH TWICE A DAY 05/05/21   Plotnikov, Evie Lacks, MD  Naldemedine Tosylate (SYMPROIC) 0.2 MG TABS Take 0.2 mg by mouth daily. 05/03/22 06/02/22  [provider]  naloxone [facility]) nasal spray 4 mg/0.1 mL Place 1 spray into the nose as needed (for overdose). Patient not taking: Reported on 05/24/2022 06/12/18   [provider]  NUCYNTA 100 MG TABS Take 100 mg by mouth as needed (facial pain).  12/09/15   [provider]  ondansetron (ZOFRAN) 4 MG tablet Take 1 tablet (4 mg total) by mouth every 8 (eight) hours as needed for nausea or vomiting. Patient not taking: Reported on 05/08/2022 03/26/22   Plotnikov, Evie Lacks, MD  Suvorexant (BELSOMRA) 20 MG TABS Take 1 tablet by mouth at bedtime. Patient not taking: Reported on 05/08/2022    [provider]  TRINTELLIX 20 MG TABS tablet Take 25 mg by mouth daily. 07/07/20   [provider]      Allergies    Aripiprazole, Fentanyl, Augmentin [amoxicillin-pot clavulanate], Irbesartan, Niacin and related, Other, Sulfamethoxazole-trimethoprim, Bupropion, and Ibuprofen    Review of Systems   Review of Systems  Gastrointestinal:  Positive for  abdominal pain.    Physical Exam Updated Vital Signs BP (!) 184/122   Pulse 83   Temp 97.9 F (36.6 C) (Oral)   Resp 18   Ht '5\' 7"'$  (1.702 m)   Wt 66.7 kg   SpO2 96%   BMI 23.02 kg/m  Physical Exam Vitals and nursing note reviewed.  Constitutional:      General: She is not in acute distress.    Appearance: She is well-developed. She is not diaphoretic.  HENT:     Head: Normocephalic and atraumatic.  Eyes:     Pupils: Pupils are equal, round, and reactive to light.  Cardiovascular:      Rate and Rhythm: Normal rate and regular rhythm.     Heart sounds: No murmur heard.    No friction rub. No gallop.  Pulmonary:     Effort: Pulmonary effort is normal.     Breath sounds: No wheezing or rales.  Abdominal:     General: There is no distension.     Palpations: Abdomen is soft.     Tenderness: There is no abdominal tenderness.     Comments: Benign abdominal exam  Musculoskeletal:        General: No tenderness.     Cervical back: Normal range of motion and neck supple.  Skin:    General: Skin is warm and dry.  Neurological:     Mental Status: She is alert and oriented to person, place, and time.  Psychiatric:        Behavior: Behavior normal.     ED Results / Procedures / Treatments   Labs (all labs ordered are listed, but only abnormal results are displayed) Labs Reviewed  COMPREHENSIVE METABOLIC PANEL - Abnormal; Notable for the following components:      Result Value   Potassium 3.2 (*)    All other components within normal limits  CBC WITH DIFFERENTIAL/PLATELET  LIPASE, BLOOD  URINALYSIS, ROUTINE W REFLEX MICROSCOPIC    EKG None  Radiology CT ABDOMEN PELVIS W CONTRAST  Result Date: 05/24/2022 CLINICAL DATA:  Abdominal pain, recent colonoscopy EXAM: CT ABDOMEN AND PELVIS WITH CONTRAST TECHNIQUE: Multidetector CT imaging of the abdomen and pelvis was performed using the standard protocol following bolus administration of intravenous contrast. RADIATION DOSE REDUCTION: This exam was performed according to the departmental dose-optimization program which includes automated exposure control, adjustment of the mA and/or kV according to patient size and/or use of iterative reconstruction technique. CONTRAST:  19m OMNIPAQUE IOHEXOL 300 MG/ML  SOLN COMPARISON:  CT abdomen and pelvis dated August 23, 2021 FINDINGS: Lower chest: No acute abnormality. Hepatobiliary: No focal liver abnormality is seen. Status post cholecystectomy. No biliary dilatation. Pancreas:  Unremarkable. No pancreatic ductal dilatation or surrounding inflammatory changes. Spleen: Normal in size without focal abnormality. Adrenals/Urinary Tract: Adrenal glands are unremarkable. Kidneys are normal, without renal calculi, focal lesion, or hydronephrosis. Bladder is unremarkable. Stomach/Bowel: Stomach is within normal limits. Appendix is not visualized, although there are no secondary findings of acute appendicitis. No evidence of bowel wall thickening, distention, or inflammatory changes. Vascular/Lymphatic: Aortic atherosclerosis. No enlarged abdominal or pelvic lymph nodes. Reproductive: Status post hysterectomy. No adnexal masses. Other: No abdominal wall hernia or abnormality. No abdominopelvic ascites. Musculoskeletal: No acute or significant osseous findings. IMPRESSION: No acute findings in the abdomen or pelvis. Electronically Signed   By: LYetta GlassmanM.D.   On: 05/24/2022 14:30    Procedures Procedures    Medications Ordered in ED Medications  iohexol (OMNIPAQUE) 300 MG/ML solution  100 mL (has no administration in time range)  sodium chloride (PF) 0.9 % injection (  Given by Other 05/24/22 1444)  iohexol (OMNIPAQUE) 300 MG/ML solution 100 mL (100 mLs Intravenous Contrast Given 05/24/22 1415)  sodium chloride (PF) 0.9 % injection (  Given by Other 05/24/22 1450)  acetaminophen (TYLENOL) tablet 1,000 mg (1,000 mg Oral Given 05/24/22 1449)  ketorolac (TORADOL) 15 MG/ML injection 15 mg (15 mg Intravenous Given 05/24/22 1448)  ketamine 50 mg in normal saline 5 mL (10 mg/mL) syringe (10 mg Intravenous Given 05/24/22 1452)    ED Course/ Medical Decision Making/ A&P                           Medical Decision Making Risk OTC drugs. Prescription drug management.   67 yo F with a chief complaints of right lower quadrant abdominal pain.  This been ongoing for months.  Seems to be improved with a heating pad.  No vomiting no diarrhea.  She has required increasing doses of medications  from her pain management physician for her trigeminal neuralgia as well as her abdominal discomfort.  She has been further worked up for this with a colonoscopy that was done today.  The family brought the report in which was completely clean.  She then became upset that her diagnosis and came here for evaluation.  She has benign abdominal exam for me.  CT scan of the abdomen pelvis without acute pathology.  I discussed the results with the patient.  I had a long discussion with her about further evaluation and management.  There were some talks that she was complaining of so much pain that she thought about ending her life.  I did offer for mental health evaluation which they are declining at this time.  I do not feel that she is unsafe for discharge.  We will have her follow-up with her pain management doctor tomorrow.  No leukocytosis no anemia, no significant electrolyte abnormality.  3:36 PM:  I have discussed the diagnosis/risks/treatment options with the patient and family.  Evaluation and diagnostic testing in the emergency department does not suggest an emergent condition requiring admission or immediate intervention beyond what has been performed at this time.  They will follow up with PCP, Pain management. We also discussed returning to the ED immediately if new or worsening sx occur. We discussed the sx which are most concerning (e.g., sudden worsening pain, fever, inability to tolerate by mouth) that necessitate immediate return. Medications administered to the patient during their visit and any new prescriptions provided to the patient are listed below.  Medications given during this visit Medications  iohexol (OMNIPAQUE) 300 MG/ML solution 100 mL (has no administration in time range)  sodium chloride (PF) 0.9 % injection (  Given by Other 05/24/22 1444)  iohexol (OMNIPAQUE) 300 MG/ML solution 100 mL (100 mLs Intravenous Contrast Given 05/24/22 1415)  sodium chloride (PF) 0.9 % injection (   Given by Other 05/24/22 1450)  acetaminophen (TYLENOL) tablet 1,000 mg (1,000 mg Oral Given 05/24/22 1449)  ketorolac (TORADOL) 15 MG/ML injection 15 mg (15 mg Intravenous Given 05/24/22 1448)  ketamine 50 mg in normal saline 5 mL (10 mg/mL) syringe (10 mg Intravenous Given 05/24/22 1452)     The patient appears reasonably screen and/or stabilized for discharge and I doubt any other medical condition or other [facility] requiring further screening, evaluation, or treatment in the ED at this time prior to discharge.  Final Clinical Impression(s) / ED Diagnoses Final diagnoses:  RLQ abdominal pain  Trigeminal neuralgia    Rx / DC Orders ED Discharge Orders     None         Deno Etienne, DO 05/24/22 1537

## 2022-05-24 NOTE — Patient Instructions (Signed)
Resume previous diet and continue present medications. Repeat colonoscopy in 10 years for surveillance! Return to care of your primary care provider.   YOU HAD AN ENDOSCOPIC PROCEDURE TODAY AT [facility]:   Refer to the procedure report that was given to you for any specific questions about what was found during the examination.  If the procedure report does not answer your questions, please call your gastroenterologist to clarify.  If you requested that your care partner not be given the details of your procedure findings, then the procedure report has been included in a sealed envelope for you to review at your convenience later.  YOU SHOULD EXPECT: Some feelings of bloating in the abdomen. Passage of more gas than usual.  Walking can help get rid of the air that was put into your GI tract during the procedure and reduce the bloating. If you had a lower endoscopy (such as a colonoscopy or flexible sigmoidoscopy) you may notice spotting of blood in your stool or on the toilet paper. If you underwent a bowel prep for your procedure, you may not have a normal bowel movement for a few days.  Please Note:  You might notice some irritation and congestion in your nose or some drainage.  This is from the oxygen used during your procedure.  There is no need for concern and it should clear up in a day or so.  SYMPTOMS TO REPORT IMMEDIATELY:  Following lower endoscopy (colonoscopy or flexible sigmoidoscopy):  Excessive amounts of blood in the stool  Significant tenderness or worsening of abdominal pains  Swelling of the abdomen that is new, acute  Fever of 100F or higher  For urgent or emergent issues, a gastroenterologist can be reached at any hour by calling [phone]. Do not use MyChart messaging for urgent concerns.    DIET:  We do recommend a small meal at first, but then you may proceed to your regular diet.  Drink plenty of fluids but you should avoid alcoholic beverages  for 24 hours.  ACTIVITY:  You should plan to take it easy for the rest of today and you should NOT DRIVE or use heavy machinery until tomorrow (because of the sedation medicines used during the test).    FOLLOW UP: Our staff will call the number listed on your records the next business day following your procedure.  We will call around 7:15- 8:00 am to check on you and address any questions or concerns that you may have regarding the information given to you following your procedure. If we do not reach you, we will leave a message.     If any biopsies were taken you will be contacted by phone or by letter within the next 1-3 weeks.  Please call us at [phone] if you have not heard about the biopsies in 3 weeks.    SIGNATURES/CONFIDENTIALITY: You and/or your care partner have signed paperwork which will be entered into your electronic medical record.  These signatures attest to the fact that that the information above on your After Visit Summary has been reviewed and is understood.  Full responsibility of the confidentiality of this discharge information lies with you and/or your care-partner.

## 2022-05-24 NOTE — Op Note (Signed)
[facility] Patient Name: Sheryl Lester Procedure Date: 05/24/2022 8:22 AM MRN: [id number] Endoscopist: Docia Chuck. Henrene Pastor , MD Age: 66 Referring MD:  Date of Birth: August 29, 1955 Gender: Female Account #: [id number] Procedure:                Colonoscopy Indications:              Colon cancer screening?"average risk. Negative                            previous examination 2011. Also complains of                            chronic abdominal pain in the right lower quadrant,                            Incidental constipation also noted Medicines:                Monitored Anesthesia Care Procedure:                Pre-Anesthesia Assessment:                           - Prior to the procedure, a History and Physical                            was performed, and patient medications and                            allergies were reviewed. The patient's tolerance of                            previous anesthesia was also reviewed. The risks                            and benefits of the procedure and the sedation                            options and risks were discussed with the patient.                            All questions were answered, and informed consent                            was obtained. Prior Anticoagulants: The patient has                            taken no previous anticoagulant or antiplatelet                            agents. After reviewing the risks and benefits, the                            patient was deemed in satisfactory condition to  undergo the procedure.                           After obtaining informed consent, the colonoscope                            was passed under direct vision. Throughout the                            procedure, the patient's blood pressure, pulse, and                            oxygen saturations were monitored continuously. The                            Colonoscope was introduced through the anus  and                            advanced to the the cecum, identified by                            appendiceal orifice and ileocecal valve. The                            ileocecal valve, appendiceal orifice, and rectum                            were photographed. The quality of the bowel                            preparation was excellent. The colonoscopy was                            performed without difficulty. The patient tolerated                            the procedure well. The bowel preparation used was                            MiraLAX and then SUPREP via split dose instruction. Scope In: 8:30:53 AM Scope Out: 8:44:26 AM Scope Withdrawal Time: 0 hours 8 minutes 57 seconds  Total Procedure Duration: 0 hours 13 minutes 33 seconds  Findings:                 The entire examined colon appeared normal on direct                            and retroflexion views. Complications:            No immediate complications. Estimated blood loss:                            None. Estimated Blood Loss:     Estimated blood loss: none. Impression:               - The entire examined  colon is normal on direct and                            retroflexion views.                           - No GI cause for pain found or suspected. Suspect                            functional. Recommendation:           - Repeat colonoscopy in 10 years for screening                            purposes.                           - Patient has a contact number available for                            emergencies. The signs and symptoms of potential                            delayed complications were discussed with the                            patient. Return to normal activities tomorrow.                            Written discharge instructions were provided to the                            patient.                           - Resume previous diet.                           - Continue present medications.                            ?"Return to the care of your primary provider Docia Chuck. Henrene Pastor, MD 05/24/2022 8:56:27 AM This report has been signed electronically.

## 2022-05-25 ENCOUNTER — Telehealth: Payer: Self-pay | Admitting: *Deleted

## 2022-05-25 NOTE — Telephone Encounter (Signed)
  Follow up Call-    Row Labels 05/24/2022    7:13 AM  Call back number   Section Header. No data exists in this row.   Post procedure Call Back phone  #   [phone]  Permission to leave phone message   Yes     Patient questions:  Do you have a fever, pain , or abdominal swelling? Yes.   Pain Score  10 *  Have you tolerated food without any problems? No.  Have you been able to return to your normal activities? No.  Do you have any questions about your discharge instructions: Diet   No. Medications  No. Follow up visit  No.  Do you have questions or concerns about your Care? No.  Actions: * If pain score is 4 or above: No action needed, pain <4.   Just fyi, patient went to ED and had CT scan , was offered mental health consult

## 2022-05-28 DIAGNOSIS — G8929 Other chronic pain: Secondary | ICD-10-CM | POA: Insufficient documentation

## 2022-06-04 ENCOUNTER — Other Ambulatory Visit (HOSPITAL_COMMUNITY): Payer: Self-pay

## 2022-06-04 MED ORDER — HEPARIN SODIUM (PORCINE) 10000 UNIT/ML IJ SOLN
40000.0000 [IU] | Freq: Every day | INTRAMUSCULAR | 11 refills | Status: DC
  Filled 2022-06-04: qty 120, 30d supply, fill #0

## 2022-06-04 MED ORDER — BUPIVACAINE HCL (PF) 0.5 % IJ SOLN
15.0000 mL | Freq: Every day | INTRAMUSCULAR | 11 refills | Status: AC
  Filled 2022-06-04: qty 450, 35d supply, fill #0

## 2022-06-05 ENCOUNTER — Ambulatory Visit: Payer: PRIVATE HEALTH INSURANCE | Admitting: Internal Medicine

## 2022-06-06 ENCOUNTER — Other Ambulatory Visit (HOSPITAL_COMMUNITY): Payer: Self-pay

## 2022-06-08 ENCOUNTER — Other Ambulatory Visit: Payer: Self-pay | Admitting: Internal Medicine

## 2022-06-08 NOTE — Telephone Encounter (Signed)
Patient called and said she is out of medication and asked if it could please be sent in today

## 2022-06-13 ENCOUNTER — Other Ambulatory Visit: Payer: Self-pay | Admitting: Internal Medicine

## 2022-07-05 ENCOUNTER — Other Ambulatory Visit (HOSPITAL_COMMUNITY): Payer: Self-pay

## 2022-07-05 MED ORDER — HYDROCODONE-ACETAMINOPHEN 5-325 MG PO TABS
1.0000 | ORAL_TABLET | Freq: Four times a day (QID) | ORAL | 0 refills | Status: DC | PRN
  Filled 2022-07-05: qty 20, 5d supply, fill #0

## 2022-09-02 ENCOUNTER — Other Ambulatory Visit: Payer: Self-pay | Admitting: Internal Medicine

## 2022-09-14 ENCOUNTER — Other Ambulatory Visit (HOSPITAL_COMMUNITY): Payer: Self-pay

## 2022-09-14 MED ORDER — MYRBETRIQ 25 MG PO TB24
25.0000 mg | ORAL_TABLET | Freq: Every day | ORAL | 2 refills | Status: DC
  Filled 2022-09-14: qty 30, 30d supply, fill #0

## 2022-09-27 ENCOUNTER — Other Ambulatory Visit (HOSPITAL_COMMUNITY): Payer: Self-pay

## 2022-10-04 ENCOUNTER — Ambulatory Visit: Payer: 59 | Admitting: Internal Medicine

## 2022-10-11 ENCOUNTER — Encounter: Payer: Self-pay | Admitting: Internal Medicine

## 2022-10-11 ENCOUNTER — Ambulatory Visit: Payer: PRIVATE HEALTH INSURANCE | Admitting: Internal Medicine

## 2022-10-11 ENCOUNTER — Ambulatory Visit: Payer: 59 | Admitting: Internal Medicine

## 2022-10-11 VITALS — BP 116/74 | HR 60 | Temp 97.8°F | Ht 67.0 in

## 2022-10-11 DIAGNOSIS — R194 Change in bowel habit: Secondary | ICD-10-CM | POA: Diagnosis not present

## 2022-10-11 DIAGNOSIS — D485 Neoplasm of uncertain behavior of skin: Secondary | ICD-10-CM | POA: Diagnosis not present

## 2022-10-11 DIAGNOSIS — Z85820 Personal history of malignant melanoma of skin: Secondary | ICD-10-CM | POA: Diagnosis not present

## 2022-10-11 DIAGNOSIS — E538 Deficiency of other specified B group vitamins: Secondary | ICD-10-CM

## 2022-10-11 LAB — COMPREHENSIVE METABOLIC PANEL
ALT: 23 U/L (ref 0–35)
AST: 21 U/L (ref 0–37)
Albumin: 4.4 g/dL (ref 3.5–5.2)
Alkaline Phosphatase: 61 U/L (ref 39–117)
BUN: 20 mg/dL (ref 6–23)
CO2: 29 mEq/L (ref 19–32)
Calcium: 9.5 mg/dL (ref 8.4–10.5)
Chloride: 101 mEq/L (ref 96–112)
Creatinine, Ser: 0.89 mg/dL (ref 0.40–1.20)
GFR: 68.36 mL/min (ref >60.00)
Glucose, Bld: 85 mg/dL (ref 70–99)
Potassium: 4.7 mEq/L (ref 3.5–5.1)
Sodium: 138 mEq/L (ref 135–145)
Total Bilirubin: 0.6 mg/dL (ref 0.2–1.2)
Total Protein: 7.2 g/dL (ref 6.0–8.3)

## 2022-10-11 LAB — VITAMIN B12: Vitamin B-12: >1500 pg/mL — ABNORMAL HIGH (ref 211–911)

## 2022-10-11 LAB — TSH: TSH: 1.46 u[IU]/mL (ref 0.35–5.50)

## 2022-10-11 NOTE — Assessment & Plan Note (Signed)
On B12 

## 2022-10-11 NOTE — Assessment & Plan Note (Signed)
Remote on abdomen Derm ref

## 2022-10-11 NOTE — Assessment & Plan Note (Signed)
Check TSH 

## 2022-10-11 NOTE — Assessment & Plan Note (Signed)
Being treated by physical therapy for pelvic floor weakness

## 2022-10-11 NOTE — Progress Notes (Signed)
Subjective:  Patient ID: Sheryl Lester, female    DOB: [birth year]  Age: 67 y.o. MRN: [id number]  CC: skin lesion (Spot over right eye for over 7 weeks and not going away, also has a spot under right arm for "quite a while")   HPI Sheryl Lester presents for OAB, IBS C/o skin lesion (Spot over right eye for over 7 weeks and not going away, also has a spot under right arm for "quite a while") H/o melanoma  Outpatient Medications Prior to Visit  Medication Sig Dispense Refill   bupivacaine,PF, (MARCAINE) 0.5 % SOLN injection Instill 15 mLs into the bladder daily. 450 mL 11   buprenorphine (BUTRANS) 20 MCG/HR PTWK Place 20 mcg onto the skin every Tuesday.     diphenhydramine-acetaminophen (TYLENOL PM) 25-500 MG TABS tablet Take 1 tablet by mouth at bedtime as needed.     Esketamine HCl (SPRAVATO, 84 MG DOSE, NA) Place into the nose once a week.     gabapentin (NEURONTIN) 600 MG tablet Take 1,800 mg by mouth 2 (two) times daily.     heparin 10000 UNIT/ML injection Instill 4 mLs (40,000 Units total) into the bladder daily. 120 mL 11   levothyroxine (SYNTHROID) 50 MCG tablet TAKE 1 TABLET BY MOUTH EVERY DAY 90 tablet 2   metoprolol tartrate (LOPRESSOR) 50 MG tablet TAKE 1 TABLET BY MOUTH TWICE A DAY 180 tablet 1   naloxone (NARCAN) nasal spray 4 mg/0.1 mL Place 1 spray into the nose as needed (for overdose).     NUCYNTA 100 MG TABS Take 100 mg by mouth as needed (facial pain).      TRINTELLIX 20 MG TABS tablet Take 25 mg by mouth daily.     hydrocortisone (ANUSOL-HC) 25 MG suppository Place 1 suppository (25 mg total) rectally 2 (two) times daily. (Patient not taking: Reported on 05/08/2022) 20 suppository 1   lubiprostone (AMITIZA) 8 MCG capsule Take 8 mcg by mouth 2 (two) times daily.     mirabegron ER (MYRBETRIQ) 25 MG TB24 tablet Take 1 tablet by mouth daily. (Patient not taking: Reported on 10/11/2022) 30 tablet 2   Doxepin HCl 6 MG TABS Take 1 tablet by mouth at bedtime. (Patient not  taking: Reported on 05/08/2022)     glycopyrrolate (ROBINUL) 2 MG tablet TAKE 1 TABLET BY MOUTH THREE TIMES A DAY AS NEEDED (Patient not taking: Reported on 10/11/2022) 270 tablet 1   HYDROcodone-acetaminophen (NORCO/VICODIN) 5-325 MG tablet Take 1 tablet by mouth every 6 (six) hours as needed (Patient not taking: Reported on 10/11/2022) 20 tablet 0   lactulose (CHRONULAC) 10 GM/15ML solution TAKE 30-60 MLS (20-40 G TOTAL) BY MOUTH 2 (TWO) TIMES DAILY AS NEEDED (SEVERE CONSTIPATION). (Patient not taking: Reported on 10/11/2022) 1350 mL 1   linaclotide (LINZESS) 145 MCG CAPS capsule Take 1 capsule (145 mcg total) by mouth 1 day or 1 dose. (Patient not taking: Reported on 05/08/2022) 30 capsule 5   ondansetron (ZOFRAN) 4 MG tablet Take 1 tablet (4 mg total) by mouth every 8 (eight) hours as needed for nausea or vomiting. (Patient not taking: Reported on 05/08/2022) 20 tablet 0   Suvorexant (BELSOMRA) 20 MG TABS Take 1 tablet by mouth at bedtime. (Patient not taking: Reported on 05/08/2022)     No facility-administered medications prior to visit.    ROS: Review of Systems  Constitutional:  Negative for activity change, appetite change, chills, fatigue and unexpected weight change.  HENT:  Negative for congestion, mouth sores and sinus  pressure.   Eyes:  Negative for visual disturbance.  Respiratory:  Negative for cough and chest tightness.   Gastrointestinal:  Negative for abdominal pain and nausea.  Genitourinary:  Negative for difficulty urinating, frequency and vaginal pain.  Musculoskeletal:  Negative for back pain and gait problem.  Skin:  Positive for color change. Negative for pallor and rash.  Neurological:  Negative for dizziness, tremors, weakness, numbness and headaches.  Psychiatric/Behavioral:  Negative for confusion and sleep disturbance.     Objective:  BP 116/74 (BP Location: Right Arm, Patient Position: Sitting, Cuff Size: Normal)   Pulse 60   Temp 97.8 F (36.6 C) (Temporal)    Ht 5' 7"$  (1.702 m)   BMI 23.02 kg/m   BP Readings from Last 3 Encounters:  10/11/22 116/74  05/24/22 (!) 184/122  05/24/22 (!) 165/91    Wt Readings from Last 3 Encounters:  05/24/22 147 lb (66.7 kg)  05/24/22 160 lb (72.6 kg)  05/08/22 160 lb (72.6 kg)    Physical Exam Constitutional:      General: She is not in acute distress.    Appearance: Normal appearance. She is well-developed.  HENT:     Head: Normocephalic.     Right Ear: External ear normal.     Left Ear: External ear normal.     Nose: Nose normal.  Eyes:     General:        Right eye: No discharge.        Left eye: No discharge.     Conjunctiva/sclera: Conjunctivae normal.     Pupils: Pupils are equal, round, and reactive to light.  Neck:     Thyroid: No thyromegaly.     Vascular: No JVD.     Trachea: No tracheal deviation.  Cardiovascular:     Rate and Rhythm: Normal rate and regular rhythm.     Heart sounds: Normal heart sounds.  Pulmonary:     Effort: No respiratory distress.     Breath sounds: No stridor. No wheezing.  Abdominal:     General: Bowel sounds are normal. There is no distension.     Palpations: Abdomen is soft. There is no mass.     Tenderness: There is no abdominal tenderness. There is no guarding or rebound.  Musculoskeletal:        General: No tenderness.     Cervical back: Normal range of motion and neck supple. No rigidity.  Lymphadenopathy:     Cervical: No cervical adenopathy.  Skin:    Findings: No erythema or rash.  Neurological:     Cranial Nerves: No cranial nerve deficit.     Motor: No abnormal muscle tone.     Coordination: Coordination normal.     Deep Tendon Reflexes: Reflexes normal.  Psychiatric:        Behavior: Behavior normal.        Thought Content: Thought content normal.        Judgment: Judgment normal.   SKs on skin small pink slightly raised on the right upper eyelid 5 x 4 mm pigmented raised in the right axilla  Lab Results  Component Value Date    WBC 9.6 05/24/2022   HGB 14.1 05/24/2022   HCT 43.6 05/24/2022   PLT 241 05/24/2022   GLUCOSE 92 05/24/2022   CHOL 202 (H) 09/22/2019   TRIG 78.0 09/22/2019   HDL 53.20 09/22/2019   LDLDIRECT 116.2 02/12/2013   LDLCALC 133 (H) 09/22/2019   ALT 10 05/24/2022   AST 17  05/24/2022   NA 136 05/24/2022   K 3.2 (L) 05/24/2022   CL 104 05/24/2022   CREATININE 0.75 05/24/2022   BUN 10 05/24/2022   CO2 22 05/24/2022   TSH 0.69 04/24/2022   INR 0.97 05/25/2018   HGBA1C 5.8 09/22/2019    CT ABDOMEN PELVIS W CONTRAST  Result Date: 05/24/2022 CLINICAL DATA:  Abdominal pain, recent colonoscopy EXAM: CT ABDOMEN AND PELVIS WITH CONTRAST TECHNIQUE: Multidetector CT imaging of the abdomen and pelvis was performed using the standard protocol following bolus administration of intravenous contrast. RADIATION DOSE REDUCTION: This exam was performed according to the departmental dose-optimization program which includes automated exposure control, adjustment of the mA and/or kV according to patient size and/or use of iterative reconstruction technique. CONTRAST:  123m OMNIPAQUE IOHEXOL 300 MG/ML  SOLN COMPARISON:  CT abdomen and pelvis dated August 23, 2021 FINDINGS: Lower chest: No acute abnormality. Hepatobiliary: No focal liver abnormality is seen. Status post cholecystectomy. No biliary dilatation. Pancreas: Unremarkable. No pancreatic ductal dilatation or surrounding inflammatory changes. Spleen: Normal in size without focal abnormality. Adrenals/Urinary Tract: Adrenal glands are unremarkable. Kidneys are normal, without renal calculi, focal lesion, or hydronephrosis. Bladder is unremarkable. Stomach/Bowel: Stomach is within normal limits. Appendix is not visualized, although there are no secondary findings of acute appendicitis. No evidence of bowel wall thickening, distention, or inflammatory changes. Vascular/Lymphatic: Aortic atherosclerosis. No enlarged abdominal or pelvic lymph nodes. Reproductive:  Status post hysterectomy. No adnexal masses. Other: No abdominal wall hernia or abnormality. No abdominopelvic ascites. Musculoskeletal: No acute or significant osseous findings. IMPRESSION: No acute findings in the abdomen or pelvis. Electronically Signed   By: LYetta GlassmanM.D.   On: 05/24/2022 14:30    Assessment & Plan:   Problem List Items Addressed This Visit       Musculoskeletal and Integument   Neoplasm of uncertain behavior of skin - Primary    ?SK R axilla, R eyelid Derm ref      Relevant Orders   Ambulatory referral to Dermatology   Comprehensive metabolic panel   TSH   Vitamin B12     Other   History of malignant melanoma of skin    Remote on abdomen Derm ref      Relevant Orders   Ambulatory referral to Dermatology   Comprehensive metabolic panel   TSH   Vitamin B12   Change in bowel habits    Being treated by physical therapy for pelvic floor weakness      B12 deficiency    On B12      Relevant Orders   Comprehensive metabolic panel   TSH   Vitamin B12      No orders of the defined types were placed in this encounter.     Follow-up: Return in about 3 months (around 01/09/2023) for a follow-up visit.  AWalker Kehr MD

## 2022-10-11 NOTE — Assessment & Plan Note (Signed)
?  SK R axilla, R eyelid Derm ref

## 2022-11-19 ENCOUNTER — Ambulatory Visit (INDEPENDENT_AMBULATORY_CARE_PROVIDER_SITE_OTHER)
Admission: RE | Admit: 2022-11-19 | Discharge: 2022-11-19 | Disposition: A | Discharge status: Home / Self Care | Payer: 59 | Source: Ambulatory Visit | Attending: Internal Medicine | Admitting: Internal Medicine

## 2022-11-19 DIAGNOSIS — Z780 Asymptomatic menopausal state: Secondary | ICD-10-CM

## 2022-12-10 ENCOUNTER — Other Ambulatory Visit: Payer: Self-pay | Admitting: Internal Medicine

## 2023-01-18 ENCOUNTER — Other Ambulatory Visit: Payer: Self-pay | Admitting: Internal Medicine

## 2023-02-11 ENCOUNTER — Encounter (HOSPITAL_COMMUNITY): Payer: Self-pay | Admitting: Behavioral Health

## 2023-02-11 ENCOUNTER — Ambulatory Visit (HOSPITAL_COMMUNITY)
Admission: EM | Admit: 2023-02-11 | Discharge: 2023-02-11 | Disposition: A | Discharge status: Home / Self Care | Payer: Medicare Other | Attending: Behavioral Health | Admitting: Behavioral Health

## 2023-02-11 DIAGNOSIS — F319 Bipolar disorder, unspecified: Secondary | ICD-10-CM | POA: Diagnosis present

## 2023-02-11 DIAGNOSIS — F419 Anxiety disorder, unspecified: Secondary | ICD-10-CM | POA: Diagnosis not present

## 2023-02-11 DIAGNOSIS — I10 Essential (primary) hypertension: Secondary | ICD-10-CM | POA: Insufficient documentation

## 2023-02-11 DIAGNOSIS — F603 Borderline personality disorder: Secondary | ICD-10-CM | POA: Insufficient documentation

## 2023-02-11 DIAGNOSIS — G500 Trigeminal neuralgia: Secondary | ICD-10-CM | POA: Insufficient documentation

## 2023-02-11 DIAGNOSIS — F331 Major depressive disorder, recurrent, moderate: Secondary | ICD-10-CM

## 2023-02-11 NOTE — Progress Notes (Signed)
   02/11/23 1309  [facility] Triage Screening (Walk-ins at [facility] only)  How Did You Hear About [place]? Self  What Is the Reason for Your Visit/Call Today? Clayton Bibles. Oborn, accompanied by her spouse Jodi Geralds, presented [facility] the [facility] with a diagnosis of depression and Trigeminal neuralgia. She has been receiving treatment at the [facility] in [place] for the past 20 years. Sanaiah recently experienced a change in insurance from [employer] to Medicare, leading to a referral to [facility]. However, she is currently awaiting confirmation if they will accept her as a new patient, which could take 3-4 weeks. Rocklynn expressed concerns that her current pain medications are ineffective. She described the daily challenges of managing severe pain, which have significantly impacted her and contributed to severe depressive symptoms. She expressed fear of running out of pain medications before establishing care with a new provider. During the assessment, Ineze denied suicidal ideation, homicidal ideation, and auditory/visual hallucinations. She has no history of illicit alcohol or drug use but has received inpatient psychiatric treatment in the past for Major Depressive Disorder (MDD). Currently, she does not have a psychiatrist or therapist. Leelu resides with her spouse and does not have children. She conveyed feelings of anxiety during the visit.  How Long Has This Been Causing You Problems? <Week  Have You Recently Had Any Thoughts About Hurting Yourself? No  Are You Planning to Commit Suicide/Harm Yourself At This time? No  Have you Recently Had Thoughts About Hurting Someone Karolee Ohs? No  Are You Planning To Harm Someone At This Time? No  Are you currently experiencing any auditory, visual or other hallucinations? No  Have You Used Any Alcohol or Drugs in the Past 24 Hours? No  Do you have any current medical co-morbidities that require immediate attention? No  Clinician  description of patient physical appearance/behavior: Patient is calm and cooperative.  What Do You Feel Would Help You the Most Today? Treatment for Depression or other mood problem;Medication(s);Stress Management  If access to [facility] was not available, would you have sought care in the Emergency Department? No  Determination of Need Routine (7 days)  Options For Referral Outpatient Therapy;Medication Management;Partial Hospitalization;Intensive Outpatient Therapy (Pain Management Clinic)

## 2023-02-11 NOTE — Discharge Instructions (Addendum)
Base on the information you have provided and the presenting issue, outpatient services with therapy and psychiatry have been recommended.  It is imperative that you follow through with treatment recommendations within 5-7 days from the of discharge to mitigate further risk to your safety and mental well-being. A list of referrals has been provided below to get you started.  You are not limited to the list provided.  In case of an urgent crisis, you may contact the Mobile Crisis Unit with [facility] at [phone].  Outpatient Therapy and Psychiatry for Medicare Recipients  [facility] [address] [place], [place], [address] [phone] phone  [facility] [address] [place], [place], [address] [phone] phone (Aetna, [employer], BCBS, Cigna, [employer], [employer], [facility], BCBS Healthy Blue, Humana, [employer], Medcost, Medicare, Medicaid, Optum, Tricare, UHC, UHC Community Plan, Wellcare)  [facility] [address] [place], [place], [address] [phone] phone  [facility] [address] [place], [place], [address] [phone] phone  [facility] [address] [place], [place], [address] [phone] phone [phone] fax  [facility] [address] [place], [place], [address] [phone] phone  [facility] [address] [place], [place], [address] [phone] phone [phone] fax  [facility] [address] [place], [place], [address] [phone] phone  [facility] [address] [place], [place], [address] [phone] phone  [facility] [address] [place], [place], [address] [phone] phone [phone] fax  

## 2023-02-11 NOTE — ED Provider Notes (Signed)
Behavioral Health Urgent Care Medical Screening Exam  Patient Name: Sheryl Lester MRN: [id number] Date of Evaluation: 02/11/23 Chief Complaint:  "Her pain and mental health are the worst it's ever been" Diagnosis:  Final diagnoses:  MDD (major depressive disorder), recurrent episode, moderate (HCC)  Anxiety    History of Present Illness: Sheryl Lester is a 67 y.o. female patient with a past psychiatric history of depression, anxiety, bipolar 1 disorder, and borderline personality disorder who presented to [facility] voluntarily and accompanied by her husband Jodi Geralds) for a walk-in assessment with complaints of worsening depression and anxiety. Patient requested for her husband to remain present and participate in assessment.  Patient assessed face-to-face by this provider and chart reviewed on 02/11/23. On evaluation, Sheryl Lester is seated in assessment area in no acute distress. Patient is alert and oriented x4, cooperative and pleasant. Speech is clear and coherent, normal rate and volume. Eye contact is good. Mood is anxious and depressed with congruent affect. Thought process is coherent with logical thought content. Patient denies suicidal and homicidal ideations and easily contracts verbally for safety with this Clinical research associate. Patient denies a history of suicide attempts or self-harm. Patient reports 2 past psychiatric hospitalizations at [facility] in 2007 and 2017. Patient denies auditory and visual hallucinations. Patient denies symptoms of paranoia. Patient is able to converse coherently with goal-directed thoughts an no distractibility or preoccupation. Objectively, there is no evidence of psychosis/mania, delusional thinking, or indication that patient is responding to internal or external stimuli.  Patient reports good sleep (6-8 hours/night) and decreased appetite. Patient lives in [place] with her husband and denies access to weapons/firearms. Patient denies use of alcohol or  illicit substances. Patient states she currently sees Dr. Milagros Evener for medication management, last visit 3 weeks ago, and is currently prescribed Trintellix and Rexulti. Patient states she currently sees Meredith Leeds 1x/week for therapy, next visit on Wednesday. Patient's husband states patient has been receiving treatment at the [facility] in [place] for 15 years, last visit was on 02/06/23. Patient's husband states in May 2024, patient experienced a change in insurance from commercial insurance to [employer]. Patient's husband states [facility] doesn't accept Medicare, so Dr. Roderic Ovens made a referral to [facility], which patient states could take 2-4 weeks. Patient's husband states Dr. Carie Caddy office does not accept Medicare either, so patient is looking for a new psychiatrist. Patient states she has been taking pain medication for trigeminal neuralgia for 20 years and patient's husband states "nothing has been successful, we want a holistic evaluation and new treatment." Patient's husband states [facility] "was not comfortable giving her benzos" for anxiety. Patient reports increased depressive symptoms related to the daily challenges of managing her pain and the cost of her pain medications. Patient states her current pain medications aren't effective any more. Patient's husband states patient stays in bed for 23 hours out of the day and "won't let me leave the house, she is always calling me to come back and asking where I'm at. Her pain and mental health are the worst it's ever been."   Patient offered support and encouragement. Discussed with patient following up with outpatient psychiatric resources provided in AVS for therapy and medication management. Also provided patient with a list from [facility] of outpatient pain management clinics. Recommended for patient to present to the ED to be evaluated for her elevated BP and pulse. Patient declines, states she has  a history of hypertension, and has  blood pressure medication at home that she will take. Encouraged patient to contact her PCP to discuss her hypertension/tachycardia. Patient is in agreement with plan of care.  At this time, Sheryl Lester is educated and verbalizes understanding of mental health resources and other crisis services in the community. She is instructed to call 911 and present to the nearest emergency room should she experience any suicidal/homicidal ideation, auditory/visual/hallucinations, or detrimental worsening of her mental health condition.   Flowsheet Row ED from 02/11/2023 in [facility] ED from 05/24/2022 in [facility] at [facility] ED from 03/22/2022 in [facility] RISK CATEGORY No Risk No Risk No Risk       Psychiatric Specialty Exam  Presentation  General Appearance:Appropriate for Environment; Casual  Eye Contact:Good  Speech:Clear and Coherent; Normal Rate  Speech Volume:Normal  Handedness:Right   Mood and Affect  Mood: Depressed; Anxious  Affect: Congruent   Thought Process  Thought Processes: Coherent; Goal Directed  Descriptions of Associations:Intact  Orientation:Full (Time, Place and Person)  Thought Content:Logical    Hallucinations:None  Ideas of Reference:None  Suicidal Thoughts:No  Homicidal Thoughts:No   Sensorium  Memory: Immediate Good; Recent Good; Remote Good  Judgment: Fair  Insight: Fair   Art therapist  Concentration: Good  Attention Span: Good  Recall: Good  Fund of Knowledge: Good  Language: Good   Psychomotor Activity  Psychomotor Activity: Normal   Assets  Assets: Communication Skills; Desire for Improvement; Financial Resources/Insurance; Housing; Leisure Time; Resilience; Social Support; Transportation   Sleep  Sleep: Good  Number of hours:  6-8   Physical  Exam: Physical Exam Vitals and nursing note reviewed.  Constitutional:      General: She is not in acute distress.    Appearance: Normal appearance. She is not ill-appearing.  HENT:     Head: Normocephalic and atraumatic.     Nose: Nose normal.  Eyes:     General:        Right eye: No discharge.        Left eye: No discharge.     Conjunctiva/sclera: Conjunctivae normal.  Cardiovascular:     Rate and Rhythm: Tachycardia present.  Pulmonary:     Effort: Pulmonary effort is normal. No respiratory distress.  Musculoskeletal:        General: Normal range of motion.     Cervical back: Normal range of motion.  Skin:    General: Skin is warm and dry.  Neurological:     General: No focal deficit present.     Mental Status: She is alert and oriented to person, place, and time. Mental status is at baseline.  Psychiatric:        Attention and Perception: Attention and perception normal.        Mood and Affect: Mood is anxious and depressed.        Speech: Speech normal.        Behavior: Behavior normal. Behavior is cooperative.        Thought Content: Thought content normal. Thought content is not paranoid or delusional. Thought content does not include homicidal or suicidal ideation. Thought content does not include homicidal or suicidal plan.        Cognition and Memory: Cognition and memory normal.        Judgment: Judgment normal.    Review of Systems  Constitutional: Negative.   HENT: Negative.    Eyes: Negative.   Respiratory: Negative.  Cardiovascular: Negative.   Gastrointestinal: Negative.   Genitourinary: Negative.   Musculoskeletal: Negative.   Skin: Negative.   Neurological: Negative.  Negative for loss of consciousness.  Endo/Heme/Allergies: Negative.   Psychiatric/Behavioral:  Positive for depression. Negative for hallucinations, memory loss, substance abuse and suicidal ideas. The patient is nervous/anxious. The patient does not have insomnia.    Blood pressure  (!) 173/114, pulse (!) 124, temperature 98 F (36.7 C), temperature source Oral, resp. rate 20, SpO2 97 %. There is no height or weight on file to calculate BMI.  Musculoskeletal: Strength & Muscle Tone: within normal limits Gait & Station: normal Patient leans: N/A   [facility] MSE Discharge Disposition for Follow up and Recommendations: Based on my evaluation the patient does not appear to have an emergency medical condition and can be discharged with resources and follow up care in outpatient services for Medication Management and Individual Therapy   Sunday Corn, NP 02/11/2023, 2:45 PM

## 2023-03-20 ENCOUNTER — Ambulatory Visit: Payer: 59 | Admitting: Internal Medicine

## 2023-03-26 ENCOUNTER — Ambulatory Visit (HOSPITAL_COMMUNITY): Payer: Self-pay

## 2023-03-28 ENCOUNTER — Ambulatory Visit (HOSPITAL_COMMUNITY): Payer: PRIVATE HEALTH INSURANCE

## 2023-04-16 ENCOUNTER — Other Ambulatory Visit: Payer: Self-pay | Admitting: Internal Medicine

## 2023-04-18 ENCOUNTER — Telehealth: Payer: Self-pay | Admitting: Internal Medicine

## 2023-04-18 ENCOUNTER — Encounter: Payer: Self-pay | Admitting: Internal Medicine

## 2023-04-18 ENCOUNTER — Ambulatory Visit: Payer: Medicare Other | Admitting: Internal Medicine

## 2023-04-18 VITALS — BP 120/80 | HR 76 | Temp 98.6°F | Ht 67.0 in | Wt 138.0 lb

## 2023-04-18 DIAGNOSIS — E538 Deficiency of other specified B group vitamins: Secondary | ICD-10-CM | POA: Diagnosis not present

## 2023-04-18 DIAGNOSIS — F339 Major depressive disorder, recurrent, unspecified: Secondary | ICD-10-CM

## 2023-04-18 DIAGNOSIS — D509 Iron deficiency anemia, unspecified: Secondary | ICD-10-CM | POA: Diagnosis not present

## 2023-04-18 DIAGNOSIS — G500 Trigeminal neuralgia: Secondary | ICD-10-CM | POA: Diagnosis not present

## 2023-04-18 DIAGNOSIS — E559 Vitamin D deficiency, unspecified: Secondary | ICD-10-CM | POA: Diagnosis not present

## 2023-04-18 DIAGNOSIS — R202 Paresthesia of skin: Secondary | ICD-10-CM | POA: Diagnosis not present

## 2023-04-18 DIAGNOSIS — F419 Anxiety disorder, unspecified: Secondary | ICD-10-CM

## 2023-04-18 LAB — COMPREHENSIVE METABOLIC PANEL
ALT: 23 U/L (ref 0–35)
AST: 18 U/L (ref 0–37)
Albumin: 4.1 g/dL (ref 3.5–5.2)
Alkaline Phosphatase: 78 U/L (ref 39–117)
BUN: 22 mg/dL (ref 6–23)
CO2: 32 meq/L (ref 19–32)
Calcium: 9.1 mg/dL (ref 8.4–10.5)
Chloride: 101 meq/L (ref 96–112)
Creatinine, Ser: 0.86 mg/dL (ref 0.40–1.20)
GFR: 70.98 mL/min (ref >60.00)
Glucose, Bld: 99 mg/dL (ref 70–99)
Potassium: 4.6 meq/L (ref 3.5–5.1)
Sodium: 138 meq/L (ref 135–145)
Total Bilirubin: 0.4 mg/dL (ref 0.2–1.2)
Total Protein: 6.9 g/dL (ref 6.0–8.3)

## 2023-04-18 LAB — CBC WITH DIFFERENTIAL/PLATELET
Basophils Absolute: 0.1 10*3/uL (ref 0.0–0.1)
Basophils Relative: 0.8 % (ref 0.0–3.0)
Eosinophils Absolute: 0.3 10*3/uL (ref 0.0–0.7)
Eosinophils Relative: 5.5 % — ABNORMAL HIGH (ref 0.0–5.0)
HCT: 36.7 % (ref 36.0–46.0)
Hemoglobin: 11.8 g/dL — ABNORMAL LOW (ref 12.0–15.0)
Lymphocytes Relative: 24.2 % (ref 12.0–46.0)
Lymphs Abs: 1.5 10*3/uL (ref 0.7–4.0)
MCHC: 32 g/dL (ref 30.0–36.0)
MCV: 92.5 fl (ref 78.0–100.0)
Monocytes Absolute: 0.6 10*3/uL (ref 0.1–1.0)
Monocytes Relative: 8.8 % (ref 3.0–12.0)
Neutro Abs: 3.9 10*3/uL (ref 1.4–7.7)
Neutrophils Relative %: 60.7 % (ref 43.0–77.0)
Platelets: 323 10*3/uL (ref 150.0–400.0)
RBC: 3.97 Mil/uL (ref 3.87–5.11)
RDW: 15.4 % (ref 11.5–15.5)
WBC: 6.4 10*3/uL (ref 4.0–10.5)

## 2023-04-18 LAB — URINALYSIS
Bilirubin Urine: NEGATIVE
Hgb urine dipstick: NEGATIVE
Ketones, ur: NEGATIVE
Leukocytes,Ua: NEGATIVE
Nitrite: NEGATIVE
Specific Gravity, Urine: 1.02 (ref 1.000–1.030)
Total Protein, Urine: NEGATIVE
Urine Glucose: NEGATIVE
Urobilinogen, UA: 0.2 (ref 0.0–1.0)
pH: 6 (ref 5.0–8.0)

## 2023-04-18 LAB — SEDIMENTATION RATE: Sed Rate: 17 mm/h (ref 0–30)

## 2023-04-18 LAB — CK: Total CK: 89 U/L (ref 7–177)

## 2023-04-18 MED ORDER — NEOMYCIN-POLYMYXIN-HC 3.5-10000-1 OT SOLN: 3.0000 [drp] | Freq: Three times a day (TID) | OTIC | 3 refills | Status: AC

## 2023-04-18 MED ORDER — METHYLPREDNISOLONE 4 MG PO TBPK: ORAL_TABLET | ORAL | 0 refills | Status: DC

## 2023-04-18 MED ORDER — TIZANIDINE HCL 4 MG PO TABS: 4.0000 mg | ORAL_TABLET | Freq: Three times a day (TID) | ORAL | 1 refills | Status: DC | PRN

## 2023-04-18 MED ORDER — VALACYCLOVIR HCL 1 G PO TABS: 1000.0000 mg | ORAL_TABLET | Freq: Three times a day (TID) | ORAL | 0 refills | Status: DC

## 2023-04-18 NOTE — Assessment & Plan Note (Addendum)
  Chronic On Nucynta, [facility] via [facility]. On pain regimen - Pain Clinic Dr Roderic Ovens in [place]  Potential benefits of a long term opioids use as well as potential risks (i.e. addiction risk, apnea etc) and complications (i.e. Somnolence, constipation and others) were explained to the patient and were aknowledged. Blue-Emu cream was recommended to use 2-3 times a day Zanaflex prn Medrol pac Valtrex x 7 d

## 2023-04-18 NOTE — Assessment & Plan Note (Addendum)
Fluctuating hemoglobin in the past.  No signs of bleeding.  Check CBC, iron

## 2023-04-18 NOTE — Telephone Encounter (Signed)
Patient was seen this morning.  She would like her referral to go to [facility] at [facility].

## 2023-04-18 NOTE — Assessment & Plan Note (Addendum)
We continue with Viibryd 40 mg daily, Trintellix 20 mg daily and Remeron 30 mg daily.  Worsening symptoms Pt is asking to be referred to [facility]

## 2023-04-18 NOTE — Assessment & Plan Note (Signed)
On B12 

## 2023-04-18 NOTE — Progress Notes (Signed)
Subjective:  Patient ID: Sheryl Lester, female    DOB: 11-Jul-1956  Age: 67 y.o. MRN: [id number]  CC: Follow-up (Tingling sensation in hands and feet)   HPI Sheryl Lester presents for worsening sx's of trigeminal neuralgia on the R x 2 months. Pt lost wt. On Nucynta, Butrans via [facility]. C/o depression - needs a referral interested in TMS. C/o body spasms due to pain.    Outpatient Medications Prior to Visit  Medication Sig Dispense Refill   bupivacaine,PF, (MARCAINE) 0.5 % SOLN injection Instill 15 mLs into the bladder daily. 450 mL 11   buprenorphine (BUTRANS) 20 MCG/HR PTWK Place 20 mcg onto the skin every Tuesday.     diazepam (VALIUM) 5 MG tablet Take 5 mg by mouth daily as needed.     diphenhydramine-acetaminophen (TYLENOL PM) 25-500 MG TABS tablet Take 1 tablet by mouth at bedtime as needed.     Esketamine HCl (SPRAVATO, 84 MG DOSE, NA) Place into the nose once a week.     estradiol (ESTRACE) 0.1 MG/GM vaginal cream SMARTSIG:1 Applicator Vaginal Daily     gabapentin (NEURONTIN) 600 MG tablet Take 1,800 mg by mouth 2 (two) times daily.     heparin 32440 UNIT/ML injection Instill 4 mLs (40,000 Units total) into the bladder daily. 120 mL 11   hydrOXYzine (ATARAX) 25 MG tablet Take 25 mg by mouth at bedtime.     levothyroxine (SYNTHROID) 50 MCG tablet TAKE 1 TABLET BY MOUTH EVERY DAY 90 tablet 2   lubiprostone (AMITIZA) 8 MCG capsule Take 8 mcg by mouth 2 (two) times daily.     metoprolol tartrate (LOPRESSOR) 50 MG tablet TAKE 1 TABLET BY MOUTH TWICE A DAY 180 tablet 1   naloxone (NARCAN) nasal spray 4 mg/0.1 mL Place 1 spray into the nose as needed (for overdose).     NUCYNTA 100 MG TABS Take 100 mg by mouth as needed (facial pain).      TRINTELLIX 20 MG TABS tablet Take 25 mg by mouth daily.     hydrocortisone (ANUSOL-HC) 25 MG suppository Place 1 suppository (25 mg total) rectally 2 (two) times daily. (Patient not taking: Reported on 05/08/2022) 20 suppository 1    mirabegron ER (MYRBETRIQ) 25 MG TB24 tablet Take 1 tablet by mouth daily. (Patient not taking: Reported on 10/11/2022) 30 tablet 2   No facility-administered medications prior to visit.    ROS: Review of Systems  Constitutional:  Positive for fatigue. Negative for activity change, appetite change, chills and unexpected weight change.  HENT:  Positive for ear pain. Negative for congestion, mouth sores and sinus pressure.   Eyes:  Negative for visual disturbance.  Respiratory:  Negative for cough and chest tightness.   Gastrointestinal:  Negative for abdominal pain and nausea.  Genitourinary:  Negative for difficulty urinating, frequency and vaginal pain.  Musculoskeletal:  Positive for myalgias. Negative for arthralgias, back pain and gait problem.  Skin:  Negative for pallor and rash.  Neurological:  Positive for headaches. Negative for dizziness, tremors, weakness and numbness.  Psychiatric/Behavioral:  Positive for dysphoric mood. Negative for confusion and sleep disturbance. The patient is nervous/anxious.     Objective:  BP 120/80 (BP Location: Left Arm, Patient Position: Sitting, Cuff Size: Large)   Pulse 76   Temp 98.6 F (37 C) (Oral)   Ht 5\' 7"  (1.702 m)   Wt 138 lb (62.6 kg)   SpO2 99%   BMI 21.61 kg/m   BP Readings from Last 3 Encounters:  04/18/23 120/80  10/11/22 116/74  05/24/22 (!) 184/122    Wt Readings from Last 3 Encounters:  04/18/23 138 lb (62.6 kg)  05/24/22 147 lb (66.7 kg)  05/24/22 160 lb (72.6 kg)    Physical Exam Constitutional:      General: She is not in acute distress.    Appearance: She is well-developed.  HENT:     Head: Normocephalic.     Right Ear: External ear normal.     Left Ear: External ear normal.     Nose: Nose normal.  Eyes:     General:        Right eye: No discharge.        Left eye: No discharge.     Conjunctiva/sclera: Conjunctivae normal.     Pupils: Pupils are equal, round, and reactive to light.  Neck:      Thyroid: No thyromegaly.     Vascular: No JVD.     Trachea: No tracheal deviation.  Cardiovascular:     Rate and Rhythm: Normal rate and regular rhythm.     Heart sounds: Normal heart sounds.  Pulmonary:     Effort: No respiratory distress.     Breath sounds: No stridor. No wheezing.  Abdominal:     General: Bowel sounds are normal. There is no distension.     Palpations: Abdomen is soft. There is no mass.     Tenderness: There is no abdominal tenderness. There is no guarding or rebound.  Musculoskeletal:        General: No tenderness.     Cervical back: Normal range of motion. Rigidity present.  Lymphadenopathy:     Cervical: No cervical adenopathy.  Skin:    Findings: No erythema or rash.  Neurological:     Cranial Nerves: No cranial nerve deficit.     Motor: No abnormal muscle tone.     Coordination: Coordination abnormal.     Deep Tendon Reflexes: Reflexes normal.  Psychiatric:        Behavior: Behavior normal.        Thought Content: Thought content normal.        Judgment: Judgment normal.   R ear wax - removed R face is sensitive to palpation    A total time of 45 minutes was spent preparing to see the patient, reviewing tests, x-rays, operative reports and other medical records.  Also, obtaining history and performing comprehensive physical exam.  Additionally, counseling the patient regarding the above listed issues - neuralgia, depression, anxiety.   Finally, documenting clinical information in the health records, coordination of care, educating the patient. It is a complex case.   Lab Results  Component Value Date   WBC 6.4 04/18/2023   HGB 11.8 (L) 04/18/2023   HCT 36.7 04/18/2023   PLT 323.0 04/18/2023   GLUCOSE 99 04/18/2023   CHOL 202 (H) 09/22/2019   TRIG 78.0 09/22/2019   HDL 53.20 09/22/2019   LDLDIRECT 116.2 02/12/2013   LDLCALC 133 (H) 09/22/2019   ALT 23 04/18/2023   AST 18 04/18/2023   NA 138 04/18/2023   K 4.6 04/18/2023   CL 101 04/18/2023    CREATININE 0.86 04/18/2023   BUN 22 04/18/2023   CO2 32 04/18/2023   TSH 0.62 04/18/2023   INR 0.97 05/25/2018   HGBA1C 5.8 09/22/2019    No results found.  Assessment & Plan:   Problem List Items Addressed This Visit     ANEMIA-IRON DEFICIENCY    Fluctuating hemoglobin in the past.  No signs of bleeding.  Check CBC, iron      Relevant Orders   CBC with Differential/Platelet (Completed)   Sedimentation rate (Completed)   Iron, TIBC and Ferritin Panel (Completed)   Anxiety    Chronic anxiety. Hydroxyzine, diazepam as needed Was referred to [facility] behavioral medicine      Relevant Medications   hydrOXYzine (ATARAX) 25 MG tablet   diazepam (VALIUM) 5 MG tablet   Depression    We continue with Viibryd 40 mg daily, Trintellix 20 mg daily and Remeron 30 mg daily.  Worsening symptoms Pt is asking to be referred to [facility]      Relevant Medications   hydrOXYzine (ATARAX) 25 MG tablet   diazepam (VALIUM) 5 MG tablet   Other Relevant Orders   Ambulatory referral to Psychiatry   T3, free (Completed)   T4, free (Completed)   TSH (Completed)   Trigeminal neuralgia - Primary     Chronic On Nucynta, Butrans via Pain Clinic. On pain regimen - Pain Clinic Dr Roderic Ovens in [place]  Potential benefits of a long term opioids use as well as potential risks (i.e. addiction risk, apnea etc) and complications (i.e. Somnolence, constipation and others) were explained to the patient and were aknowledged. Blue-Emu cream was recommended to use 2-3 times a day Zanaflex prn Medrol pac Valtrex x 7 d      Relevant Medications   hydrOXYzine (ATARAX) 25 MG tablet   diazepam (VALIUM) 5 MG tablet   tiZANidine (ZANAFLEX) 4 MG tablet   Other Relevant Orders   CBC with Differential/Platelet (Completed)   Comprehensive metabolic panel (Completed)   T3, free (Completed)   T4, free (Completed)   TSH (Completed)   CK (Completed)   Vitamin B12 (Completed)   VITAMIN D 25 Hydroxy (Vit-D  Deficiency, Fractures) (Completed)   Urinalysis (Completed)   Sedimentation rate (Completed)   Iron, TIBC and Ferritin Panel (Completed)   B12 deficiency    On B12      Relevant Orders   Vitamin B12 (Completed)   Vitamin D deficiency   Relevant Orders   CK (Completed)   VITAMIN D 25 Hydroxy (Vit-D Deficiency, Fractures) (Completed)   Paresthesias    ?etiology Check B12, TSH, ESR         Meds ordered this encounter  Medications   methylPREDNISolone (MEDROL DOSEPAK) 4 MG TBPK tablet    Sig: As directed    Dispense:  21 tablet    Refill:  0   valACYclovir (VALTREX) 1000 MG tablet    Sig: Take 1 tablet (1,000 mg total) by mouth 3 (three) times daily.    Dispense:  21 tablet    Refill:  0   tiZANidine (ZANAFLEX) 4 MG tablet    Sig: Take 1 tablet (4 mg total) by mouth every 8 (eight) hours as needed for muscle spasms.    Dispense:  30 tablet    Refill:  1   neomycin-polymyxin-hydrocortisone (CORTISPORIN) OTIC solution    Sig: Place 3 drops into the right ear 3 (three) times daily.    Dispense:  10 mL    Refill:  3      Follow-up: Return in about 4 weeks (around 05/16/2023) for a follow-up visit.  Sonda Primes, MD

## 2023-04-18 NOTE — Telephone Encounter (Signed)
Please call this pt back I read to her all the medication that was sent today not sure what she is asking for. Please advise.

## 2023-04-18 NOTE — Assessment & Plan Note (Signed)
?  etiology Check B12, TSH, ESR

## 2023-04-18 NOTE — Patient Instructions (Signed)
Blue-Emu cream -- use 2-3 times a day ? ?

## 2023-04-19 ENCOUNTER — Encounter: Payer: Self-pay | Admitting: Internal Medicine

## 2023-04-19 LAB — IRON,TIBC AND FERRITIN PANEL
%SAT: 16 % (ref 16–45)
Ferritin: 160 ng/mL (ref 16–288)
Iron: 54 ug/dL (ref 45–160)
TIBC: 343 ug/dL (ref 250–450)

## 2023-04-19 LAB — T4, FREE: Free T4: 0.9 ng/dL (ref 0.60–1.60)

## 2023-04-19 LAB — VITAMIN B12: Vitamin B-12: 763 pg/mL (ref 211–911)

## 2023-04-19 LAB — TSH: TSH: 0.62 u[IU]/mL (ref 0.35–5.50)

## 2023-04-19 LAB — VITAMIN D 25 HYDROXY (VIT D DEFICIENCY, FRACTURES): VITD: 42.4 ng/mL (ref 30.00–100.00)

## 2023-04-19 LAB — T3, FREE: T3, Free: 3 pg/mL (ref 2.3–4.2)

## 2023-04-19 NOTE — Telephone Encounter (Signed)
A referral to psychiatry went to [facility] behavioral medicine.  Thanks

## 2023-04-21 ENCOUNTER — Encounter: Payer: Self-pay | Admitting: Internal Medicine

## 2023-04-21 NOTE — Assessment & Plan Note (Signed)
Chronic anxiety. Hydroxyzine, diazepam as needed Was referred to [facility] behavioral medicine

## 2023-04-29 ENCOUNTER — Ambulatory Visit (HOSPITAL_COMMUNITY): Payer: Self-pay

## 2023-05-06 ENCOUNTER — Encounter (HOSPITAL_COMMUNITY): Payer: Self-pay

## 2023-05-06 ENCOUNTER — Other Ambulatory Visit: Payer: Self-pay

## 2023-05-06 ENCOUNTER — Emergency Department (HOSPITAL_COMMUNITY): Payer: Medicare Other

## 2023-05-06 ENCOUNTER — Ambulatory Visit (HOSPITAL_COMMUNITY)
Admission: RE | Admit: 2023-05-06 | Discharge: 2023-05-06 | Disposition: A | Discharge status: Home / Self Care | Payer: Medicare Other | Source: Ambulatory Visit | Attending: Internal Medicine | Admitting: Internal Medicine

## 2023-05-06 ENCOUNTER — Emergency Department (HOSPITAL_COMMUNITY)
Admission: EM | Admit: 2023-05-06 | Discharge: 2023-05-06 | Disposition: A | Discharge status: Home / Self Care | Payer: Medicare Other | Attending: Student | Admitting: Student

## 2023-05-06 VITALS — BP 150/104 | HR 119 | Temp 97.5°F | Resp 22

## 2023-05-06 DIAGNOSIS — G4489 Other headache syndrome: Secondary | ICD-10-CM | POA: Diagnosis not present

## 2023-05-06 DIAGNOSIS — R630 Anorexia: Secondary | ICD-10-CM

## 2023-05-06 DIAGNOSIS — I10 Essential (primary) hypertension: Secondary | ICD-10-CM | POA: Insufficient documentation

## 2023-05-06 DIAGNOSIS — Z20822 Contact with and (suspected) exposure to covid-19: Secondary | ICD-10-CM | POA: Insufficient documentation

## 2023-05-06 DIAGNOSIS — E039 Hypothyroidism, unspecified: Secondary | ICD-10-CM | POA: Insufficient documentation

## 2023-05-06 DIAGNOSIS — G500 Trigeminal neuralgia: Secondary | ICD-10-CM | POA: Insufficient documentation

## 2023-05-06 DIAGNOSIS — Z7952 Long term (current) use of systemic steroids: Secondary | ICD-10-CM | POA: Diagnosis not present

## 2023-05-06 DIAGNOSIS — Z85820 Personal history of malignant melanoma of skin: Secondary | ICD-10-CM | POA: Insufficient documentation

## 2023-05-06 DIAGNOSIS — R2689 Other abnormalities of gait and mobility: Secondary | ICD-10-CM | POA: Diagnosis not present

## 2023-05-06 DIAGNOSIS — Z8673 Personal history of transient ischemic attack (TIA), and cerebral infarction without residual deficits: Secondary | ICD-10-CM | POA: Insufficient documentation

## 2023-05-06 DIAGNOSIS — Z8616 Personal history of COVID-19: Secondary | ICD-10-CM | POA: Diagnosis not present

## 2023-05-06 DIAGNOSIS — Z79899 Other long term (current) drug therapy: Secondary | ICD-10-CM | POA: Insufficient documentation

## 2023-05-06 DIAGNOSIS — R296 Repeated falls: Secondary | ICD-10-CM

## 2023-05-06 DIAGNOSIS — R42 Dizziness and giddiness: Secondary | ICD-10-CM

## 2023-05-06 DIAGNOSIS — R000 Tachycardia, unspecified: Secondary | ICD-10-CM

## 2023-05-06 DIAGNOSIS — R531 Weakness: Secondary | ICD-10-CM

## 2023-05-06 DIAGNOSIS — J45909 Unspecified asthma, uncomplicated: Secondary | ICD-10-CM | POA: Diagnosis not present

## 2023-05-06 DIAGNOSIS — R519 Headache, unspecified: Secondary | ICD-10-CM | POA: Diagnosis present

## 2023-05-06 LAB — COMPREHENSIVE METABOLIC PANEL
ALT: 22 U/L (ref 0–44)
AST: 18 U/L (ref 15–41)
Albumin: 3.9 g/dL (ref 3.5–5.0)
Alkaline Phosphatase: 62 U/L (ref 38–126)
Anion gap: 13 (ref 5–15)
BUN: 12 mg/dL (ref 8–23)
CO2: 23 mmol/L (ref 22–32)
Calcium: 9.4 mg/dL (ref 8.9–10.3)
Chloride: 102 mmol/L (ref 98–111)
Creatinine, Ser: 0.73 mg/dL (ref 0.44–1.00)
GFR, Estimated: >60 mL/min (ref >60)
Glucose, Bld: 86 mg/dL (ref 70–99)
Potassium: 3.9 mmol/L (ref 3.5–5.1)
Sodium: 138 mmol/L (ref 135–145)
Total Bilirubin: 0.2 mg/dL — ABNORMAL LOW (ref 0.3–1.2)
Total Protein: 6.6 g/dL (ref 6.5–8.1)

## 2023-05-06 LAB — CBC WITH DIFFERENTIAL/PLATELET
Abs Immature Granulocytes: 0.02 10*3/uL (ref 0.00–0.07)
Basophils Absolute: 0 10*3/uL (ref 0.0–0.1)
Basophils Relative: 1 %
Eosinophils Absolute: 0.3 10*3/uL (ref 0.0–0.5)
Eosinophils Relative: 4 %
HCT: 38.2 % (ref 36.0–46.0)
Hemoglobin: 12.4 g/dL (ref 12.0–15.0)
Immature Granulocytes: 0 %
Lymphocytes Relative: 33 %
Lymphs Abs: 2.2 10*3/uL (ref 0.7–4.0)
MCH: 30.2 pg (ref 26.0–34.0)
MCHC: 32.5 g/dL (ref 30.0–36.0)
MCV: 93.2 fL (ref 80.0–100.0)
Monocytes Absolute: 0.5 10*3/uL (ref 0.1–1.0)
Monocytes Relative: 8 %
Neutro Abs: 3.7 10*3/uL (ref 1.7–7.7)
Neutrophils Relative %: 54 %
Platelets: 240 10*3/uL (ref 150–400)
RBC: 4.1 MIL/uL (ref 3.87–5.11)
RDW: 12.7 % (ref 11.5–15.5)
WBC: 6.7 10*3/uL (ref 4.0–10.5)
nRBC: 0 % (ref 0.0–0.2)

## 2023-05-06 LAB — BRAIN NATRIURETIC PEPTIDE: B Natriuretic Peptide: 12.2 pg/mL (ref 0.0–100.0)

## 2023-05-06 LAB — CBG MONITORING, ED: CBG: 93

## 2023-05-06 LAB — TROPONIN I (HIGH SENSITIVITY)
Troponin I (High Sensitivity): 4 ng/L (ref <18)
Troponin I (High Sensitivity): 4 ng/L (ref <18)

## 2023-05-06 LAB — GROUP A STREP BY PCR: Group A Strep by PCR: NOT DETECTED

## 2023-05-06 LAB — SARS CORONAVIRUS 2 BY RT PCR: SARS Coronavirus 2 by RT PCR: NEGATIVE

## 2023-05-06 MED ORDER — DROPERIDOL 2.5 MG/ML IJ SOLN
1.2500 mg | Freq: Once | INTRAMUSCULAR | Status: AC
  Administered 2023-05-06: 1.25 mg via INTRAVENOUS
  Filled 2023-05-06: qty 2

## 2023-05-06 MED ORDER — PROCHLORPERAZINE EDISYLATE 10 MG/2ML IJ SOLN: 10.0000 mg | Freq: Once | INTRAMUSCULAR | Status: DC

## 2023-05-06 MED ORDER — LACTATED RINGERS IV BOLUS
1000.0000 mL | Freq: Once | INTRAVENOUS | Status: AC
  Administered 2023-05-06: 1000 mL via INTRAVENOUS

## 2023-05-06 MED ORDER — DIPHENHYDRAMINE HCL 50 MG/ML IJ SOLN
25.0000 mg | Freq: Once | INTRAMUSCULAR | Status: AC
  Administered 2023-05-06: 25 mg via INTRAVENOUS
  Filled 2023-05-06: qty 1

## 2023-05-06 MED ORDER — LORAZEPAM 2 MG/ML IJ SOLN
1.0000 mg | Freq: Once | INTRAMUSCULAR | Status: AC
  Administered 2023-05-06: 1 mg via INTRAVENOUS
  Filled 2023-05-06: qty 1

## 2023-05-06 NOTE — ED Provider Notes (Signed)
[facility]    CSN: [id number] Arrival date & time: 05/06/23  1531      History   Chief Complaint Chief Complaint  Patient presents with   Sore Throat    Entered by patient   Shortness of Breath   Otalgia    HPI CARIDEE GOUGE is a 67 y.o. female.   68 year old female who presents to urgent care with complaints of sore throat.  During evaluation however the patient relates that she has had weeks of feeling very weak with multiple recurrent falls secondary to balance and dizziness issues.  Her last fall was about 48 hours ago.  The husband reports that she has had very poor p.o. intake and relates that the patient has told him that she feels like she is dying.  The sore throat has been going on about 1 to 2 weeks the other symptoms have been present for at least 3.  She has had chills but denies any fevers.  She also relates some subjective left-sided weakness as well as tingling in her left hand.  She relates a headache that is severe at times.  She has also had nausea with vomiting to the point of dry heaving.  She has had shortness of breath with walking.  She has also had ear pain bilateral.  Her husband relates he is very concerned about her and has asked her to go to the emergency room several times but the patient refused and wanted to go to urgent care.      Sore Throat Associated symptoms include headaches and shortness of breath. Pertinent negatives include no chest pain and no abdominal pain.  Shortness of Breath Associated symptoms: ear pain, headaches, sore throat and vomiting   Associated symptoms: no abdominal pain, no chest pain, no cough, no fever and no rash   Otalgia Associated symptoms: headaches, sore throat and vomiting   Associated symptoms: no abdominal pain, no cough, no fever and no rash     Past Medical History:  Diagnosis Date   ALLERGIC RHINITIS 01/20/2008   ANEMIA-IRON DEFICIENCY 01/20/2008   ANXIETY 01/20/2008   Arthritis    ASTHMATIC  BRONCHITIS, ACUTE 05/19/2008   Bipolar disorder (HCC)    CENTRAL SLEEP APNEA CONDS CLASSIFIED ELSEWHERE 08/31/2010   Complication of anesthesia 1987   "w/one of the anesthesia agents that they don't use anymore; chest muscles constricted; felt like I couldn't breath"   DEPRESSION 01/20/2008   DYSPHAGIA UNSPECIFIED 12/06/2008   ELEVATED BLOOD PRESSURE WITHOUT DIAGNOSIS OF HYPERTENSION 12/06/2008   GERD    History of sleep walking    HYPERLIPIDEMIA 01/20/2008   HYPERTENSION 12/16/2008   Hypothyroidism    Melanoma (HCC) 2012   "skin of my stomach"   Migraine    "related to menses; went away after hysterectomy"   OSA treated with BiPAP    OTITIS MEDIA, ACUTE, BILATERAL 12/16/2008   OTITIS MEDIA, SEROUS, CHRONIC 12/30/2008   PVD 05/12/2010   Pyelonephritis "hospitalized in ~ 1991"   SINUSITIS- ACUTE-NOS 09/21/2008   SWELLING MASS OR LUMP IN HEAD AND NECK 09/21/2008   THYROID NODULE, LEFT    "unable to aspirate; has gotten very small"    Trigeminal neuralgia 01/20/2008   right side o face   Unspecified hearing loss 09/21/2008   VENOUS INSUFFICIENCY, LEGS 05/03/2010    Patient Active Problem List   Diagnosis Date Noted   Paresthesias 04/18/2023   MDD (major depressive disorder), recurrent episode, moderate (HCC) 02/11/2023   History of malignant melanoma  of skin 10/11/2022   Chronic female pelvic pain 05/28/2022   Change in bowel habits 05/08/2022   Rectal pain, chronic 04/24/2022   High-tone pelvic floor dysfunction 03/31/2022   History of transient ischemic attack (TIA) 03/31/2022   Weight loss 03/26/2022   Chronic idiopathic constipation 03/26/2022   Menopause 12/21/2021   Hypothyroidism 12/21/2021   OSA (obstructive sleep apnea) 10/02/2021   Colon cancer screening 10/02/2021   Brain fog 06/21/2021   Facial cellulitis 05/08/2021   AKI (acute kidney injury) (HCC) 05/08/2021   COVID-19 02/22/2021   Obesity 02/06/2021   Neoplasm of uncertain behavior of skin 10/06/2020   Right sided  abdominal pain 07/28/2020   Nausea 07/28/2020   Skin abscess 07/28/2020   Vitamin D deficiency 05/25/2020   Confusion 09/22/2019   Elevated brain natriuretic peptide (BNP) level 09/26/2018   Closed fracture of distal end of radius 09/08/2018   B12 deficiency 08/29/2018   Age-related nuclear cataract, bilateral 08/28/2018   Corneal pannus of right eye 08/28/2018   Arthritis of right ankle 07/31/2018   Hyponatremia 07/18/2018   RLQ abdominal pain 06/12/2018   Generalized abdominal pain 02/25/2018   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 11/20/2016   Bipolar disorder, in partial remission, most recent episode depressed (HCC) 11/20/2016   Bipolar 1 disorder, depressed, moderate (HCC) 05/27/2016   Rash and nonspecific skin eruption 03/21/2016   Encounter for therapeutic drug monitoring 03/02/2016   Cholecystitis with cholelithiasis 12/12/2015   Multinodular goiter 06/14/2015   Chronic pain in face 03/25/2015   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   Migraine with aura, intractable 04/03/2013   Dysesthesia 02/19/2013   Osteopenia 12/23/2012   Intractable migraine 11/14/2012   Skin lesion 09/23/2012   Hearing loss 09/23/2012   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   Atypical facial pain 05/30/2012   Facial pain 05/29/2012   Spells 11/23/2011   Chronic interstitial cystitis 11/14/2011   Parasomnia 10/01/2011   Melanoma (HCC) 07/05/2011   Preventative health care 03/01/2011   PVD 05/12/2010   Essential hypertension 12/16/2008   TIA 12/16/2008   GERD 12/06/2008   Unspecified hearing loss 09/21/2008   HYPERLIPIDEMIA 01/20/2008   ANEMIA-IRON DEFICIENCY 01/20/2008   Anxiety 01/20/2008   Depression 01/20/2008   Trigeminal neuralgia 01/20/2008   Allergic rhinitis 01/20/2008    Past Surgical History:  Procedure Laterality Date   ABDOMINAL HYSTERECTOMY  2000   APPENDECTOMY  2000   BRAIN SURGERY     BREAST BIOPSY Left 1994   Benign   CEREBRAL MICROVASCULAR  DECOMPRESSION  1999   for chronic facial pain/ [facility]   CHOLECYSTECTOMY N/A 12/13/2015   Procedure: LAPAROSCOPIC CHOLECYSTECTOMY;  Surgeon: De Blanch Kinsinger, MD;  Location: [facility] OR;  Service: General;  Laterality: N/A;   COLONOSCOPY     x 2   INGUINAL HERNIA REPAIR Right 1987   MELANOMA EXCISION  2015   "skin of my stomach"   OOPHORECTOMY Bilateral 2000   OPEN REDUCTION INTERNAL FIXATION (ORIF) DISTAL RADIAL FRACTURE Right 10/01/2018   Procedure: OPEN REDUCTION INTERNAL FIXATION (ORIF) DISTAL RADIAL FRACTURE;  Surgeon: Bradly Bienenstock, MD;  Location: [facility] OR;  Service: Orthopedics;  Laterality: Right;  75 minutes    OB History     Gravida  1   Para      Term      Preterm      AB  1   Living         SAB      IAB      Ectopic  Multiple      Live Births               Home Medications    Prior to Admission medications   Medication Sig Start Date End Date Taking? Authorizing Provider  diphenhydramine-acetaminophen (TYLENOL PM) 25-500 MG TABS tablet Take 1 tablet by mouth at bedtime as needed.   Yes [provider]  gabapentin (NEURONTIN) 600 MG tablet Take 1,800 mg by mouth 2 (two) times daily.   Yes [provider]  levothyroxine (SYNTHROID) 50 MCG tablet TAKE 1 TABLET BY MOUTH EVERY DAY 01/18/23  Yes Plotnikov, Georgina Quint, MD  metoprolol tartrate (LOPRESSOR) 50 MG tablet TAKE 1 TABLET BY MOUTH TWICE A DAY 04/16/23  Yes Plotnikov, Georgina Quint, MD  neomycin-polymyxin-hydrocortisone (CORTISPORIN) OTIC solution Place 3 drops into the right ear 3 (three) times daily. 04/18/23 07/27/23 Yes Plotnikov, Georgina Quint, MD  NUCYNTA 100 MG TABS Take 100 mg by mouth as needed (facial pain).  12/09/15  Yes [provider]  tiZANidine (ZANAFLEX) 4 MG tablet Take 1 tablet (4 mg total) by mouth every 8 (eight) hours as needed for muscle spasms. 04/18/23  Yes Plotnikov, Georgina Quint, MD  TRINTELLIX 20 MG TABS tablet Take 25 mg by mouth daily. 07/07/20  Yes [provider]  bupivacaine,PF, (MARCAINE) 0.5 % SOLN injection Instill 15 mLs into the bladder daily. 06/04/22     buprenorphine (BUTRANS) 20 MCG/HR PTWK Place 20 mcg onto the skin every Tuesday.    [provider]  diazepam (VALIUM) 5 MG tablet Take 5 mg by mouth daily as needed. 01/25/23   [provider]  Esketamine HCl (SPRAVATO, 84 MG DOSE, NA) Place into the nose once a week.    [provider]  estradiol (ESTRACE) 0.1 MG/GM vaginal cream SMARTSIG:1 Applicator Vaginal Daily 12/02/22   [provider]  heparin 52841 UNIT/ML injection Instill 4 mLs (40,000 Units total) into the bladder daily. 06/04/22     hydrOXYzine (ATARAX) 25 MG tablet Take 25 mg by mouth at bedtime.    [provider]  lubiprostone (AMITIZA) 8 MCG capsule Take 8 mcg by mouth 2 (two) times daily. 05/01/22   [provider]  methylPREDNISolone (MEDROL DOSEPAK) 4 MG TBPK tablet As directed 04/18/23   Plotnikov, Georgina Quint, MD  mirabegron ER (MYRBETRIQ) 25 MG TB24 tablet Take 1 tablet by mouth daily. Patient not taking: Reported on 10/11/2022 09/14/22     naloxone (NARCAN) nasal spray 4 mg/0.1 mL Place 1 spray into the nose as needed (for overdose). 06/12/18   [provider]  valACYclovir (VALTREX) 1000 MG tablet Take 1 tablet (1,000 mg total) by mouth 3 (three) times daily. 04/18/23   Plotnikov, Georgina Quint, MD    Family History Family History  Problem Relation Age of Onset   Hyperlipidemia Mother    Hypertension Mother    Cancer - Cervical Mother    Diabetes Father    Melanoma Father    Anxiety disorder Father    Depression Father    Melanoma Sister    Anxiety disorder Sister    Depression Sister    Heart disease Paternal Grandmother    Anxiety disorder Brother    Depression Brother    Breast cancer Paternal Aunt    Heart disease Other        Mother side of family-several uncles    Anxiety disorder Sister    Depression Sister    Colon cancer Neg Hx     Stomach cancer Neg  Hx     Social History Social History   Tobacco Use   Smoking status: Never   Smokeless tobacco: Never  Vaping Use   Vaping status: Never Used  Substance Use Topics   Alcohol use: No   Drug use: No     Allergies   Aripiprazole, Fentanyl, Augmentin [amoxicillin-pot clavulanate], Irbesartan, Niacin and related, Other, Sulfamethoxazole-trimethoprim, Bupropion, and Ibuprofen   Review of Systems Review of Systems  Constitutional:  Positive for appetite change, chills and fatigue. Negative for fever.  HENT:  Positive for ear pain and sore throat.   Eyes:  Negative for pain and visual disturbance.  Respiratory:  Positive for shortness of breath. Negative for cough.   Cardiovascular:  Negative for chest pain and palpitations.  Gastrointestinal:  Positive for nausea and vomiting. Negative for abdominal pain.  Genitourinary:  Negative for dysuria and hematuria.  Musculoskeletal:  Positive for arthralgias and gait problem. Negative for back pain.  Skin:  Negative for color change and rash.  Neurological:  Positive for dizziness, weakness, numbness (Left fingers that started when symptoms started 3 weeks ago.) and headaches. Negative for seizures and syncope.       Falls due to loss of balance  All other systems reviewed and are negative.    Physical Exam Triage Vital Signs ED Triage Vitals [05/06/23 1542]  Encounter Vitals Group     BP (!) 150/104     Systolic BP Percentile      Diastolic BP Percentile      Pulse Rate (!) 119     Resp (!) 22     Temp (!) 97.5 F (36.4 C)     Temp src      SpO2 100 %     Weight      Height      Head Circumference      Peak Flow      Pain Score      Pain Loc      Pain Education      Exclude from Growth Chart    No data found.  Updated Vital Signs BP (!) 150/104   Pulse (!) 119   Temp (!) 97.5 F (36.4 C)   Resp (!) 22   SpO2 100%   Visual Acuity Right Eye Distance:   Left Eye Distance:   Bilateral  Distance:    Right Eye Near:   Left Eye Near:    Bilateral Near:     Physical Exam Vitals and nursing note reviewed.  Constitutional:      General: She is in acute distress.  HENT:     Head: Normocephalic and atraumatic.     Right Ear: There is impacted cerumen (Partial).     Left Ear: Tympanic membrane normal.     Mouth/Throat:     Mouth: Mucous membranes are moist.     Pharynx: Pharyngeal swelling and posterior oropharyngeal erythema present.  Eyes:     General: Gaze aligned appropriately.     Extraocular Movements:     Right eye: Normal extraocular motion.     Left eye: Normal extraocular motion.     Conjunctiva/sclera:     Right eye: Right conjunctiva is injected.     Left eye: Left conjunctiva is injected.  Cardiovascular:     Rate and Rhythm: Regular rhythm. Tachycardia present.     Heart sounds: No murmur heard. Pulmonary:     Effort: Pulmonary effort is normal. No respiratory distress.     Breath sounds: Normal breath  sounds.  Abdominal:     Palpations: Abdomen is soft.     Tenderness: There is no abdominal tenderness.  Musculoskeletal:        General: No swelling.     Cervical back: Neck supple.  Skin:    General: Skin is warm and dry.     Capillary Refill: Capillary refill takes less than 2 seconds.  Neurological:     Mental Status: She is alert and oriented to person, place, and time.     Comments: Possible weakness on the left arm and left leg  Psychiatric:     Comments: Very Tearful, very anxious appearing      UC Treatments / Results  Labs (all labs ordered are listed, but only abnormal results are displayed) Labs Reviewed - No data to display  EKG   Radiology No results found.  Procedures Procedures (including critical care time)  Medications Ordered in UC Medications - No data to display  Initial Impression / Assessment and Plan / UC Course  I have reviewed the triage vital signs and the nursing notes.  Pertinent labs & imaging  results that were available during my care of the patient were reviewed by me and considered in my medical decision making (see chart for details).    Unilateral weakness  Poor appetite  Other headache syndrome  Essential hypertension  Tachycardia  Repeated falls  Dizziness   Long discussion with the patient and her husband.  The patient potentially is having neurological symptoms that could be associated with a stroke.  She has had weeks of falls due to dizziness and poor balance and is complaining of subjective left-sided weakness with tingling in her left hand.  She is also tachycardic and hypertensive here.  She has had poor p.o. intake for days now and her husband relates that she has been extremely weak for weeks.  She has stated to her husband also that she feels like she is dying.  Given the symptoms we recommend that the patient be transferred to the emergency department for further workup.  Patient is transported by ambulance. Final Clinical Impressions(s) / UC Diagnoses   Final diagnoses:  None   Discharge Instructions   None    ED Prescriptions   None    PDMP not reviewed this encounter.   Landis Martins, [place] 05/06/23 1714

## 2023-05-06 NOTE — ED Triage Notes (Signed)
Pt arrives via ems to the er from urgent care for the c/o weakness,increased mucus, emotional x 2 weeks. Tingling noted to right side of cheeck, multiple falls, trigeminal neuralgia, bilateral ear pain, right hip pain.147/97, 90p, 99% RA. Lives at home with husband who is legally blind.

## 2023-05-06 NOTE — ED Notes (Signed)
Patient verbalizes understanding of discharge instructions. Opportunity for questioning and answers were provided. Armband removed by staff, pt discharged from ED. Pt taken to ED entrance via wheel chair.  

## 2023-05-06 NOTE — ED Notes (Signed)
Assumed care of patient.

## 2023-05-06 NOTE — ED Provider Notes (Signed)
[place] EMERGENCY DEPARTMENT AT [facility] Provider Note  CSN: [id number] Arrival date & time: 05/06/23 1722  Chief Complaint(s) Multiple Complaints  HPI Sheryl Lester is a 67 y.o. female with PMH trigeminal neuralgia, HTN, HLD, hypothyroidism who presents emergency room for evaluation multiple complaints including headache, worsening of trigeminal neuralgia pain, mucus in the back of her throat, disequilibrium and frequent falls.  She states that she has had a sore throat for 2 weeks and has had a headache for many years given her persistent trigeminal neuralgia.  She went to urgent care will transfer the patient to emergency department for further workup.  Patient states that she is concerned that she is about to "be let go" from her pain clinic and has follow-up scheduled for outpatient neurology and for pain psychiatry.  She denies numbness, tingling, weakness, nausea, vomiting, fever, abdominal pain, chest pain, shortness of breath or other systemic symptoms.  Urgent care note states that the patient had some subjective left-sided weakness but she is not complaining of this here in the ER today.   Past Medical History Past Medical History:  Diagnosis Date   ALLERGIC RHINITIS 01/20/2008   ANEMIA-IRON DEFICIENCY 01/20/2008   ANXIETY 01/20/2008   Arthritis    ASTHMATIC BRONCHITIS, ACUTE 05/19/2008   Bipolar disorder (HCC)    CENTRAL SLEEP APNEA CONDS CLASSIFIED ELSEWHERE 08/31/2010   Complication of anesthesia 1987   "w/one of the anesthesia agents that they don't use anymore; chest muscles constricted; felt like I couldn't breath"   DEPRESSION 01/20/2008   DYSPHAGIA UNSPECIFIED 12/06/2008   ELEVATED BLOOD PRESSURE WITHOUT DIAGNOSIS OF HYPERTENSION 12/06/2008   GERD    History of sleep walking    HYPERLIPIDEMIA 01/20/2008   HYPERTENSION 12/16/2008   Hypothyroidism    Melanoma (HCC) 2012   "skin of my stomach"   Migraine    "related to menses; went away after hysterectomy"    OSA treated with BiPAP    OTITIS MEDIA, ACUTE, BILATERAL 12/16/2008   OTITIS MEDIA, SEROUS, CHRONIC 12/30/2008   PVD 05/12/2010   Pyelonephritis "hospitalized in ~ 1991"   SINUSITIS- ACUTE-NOS 09/21/2008   SWELLING MASS OR LUMP IN HEAD AND NECK 09/21/2008   THYROID NODULE, LEFT    "unable to aspirate; has gotten very small"    Trigeminal neuralgia 01/20/2008   right side o face   Unspecified hearing loss 09/21/2008   VENOUS INSUFFICIENCY, LEGS 05/03/2010   Patient Active Problem List   Diagnosis Date Noted   Paresthesias 04/18/2023   MDD (major depressive disorder), recurrent episode, moderate (HCC) 02/11/2023   History of malignant melanoma of skin 10/11/2022   Chronic female pelvic pain 05/28/2022   Change in bowel habits 05/08/2022   Rectal pain, chronic 04/24/2022   High-tone pelvic floor dysfunction 03/31/2022   History of transient ischemic attack (TIA) 03/31/2022   Weight loss 03/26/2022   Chronic idiopathic constipation 03/26/2022   Menopause 12/21/2021   Hypothyroidism 12/21/2021   OSA (obstructive sleep apnea) 10/02/2021   Colon cancer screening 10/02/2021   Brain fog 06/21/2021   Facial cellulitis 05/08/2021   AKI (acute kidney injury) (HCC) 05/08/2021   COVID-19 02/22/2021   Obesity 02/06/2021   Neoplasm of uncertain behavior of skin 10/06/2020   Right sided abdominal pain 07/28/2020   Nausea 07/28/2020   Skin abscess 07/28/2020   Vitamin D deficiency 05/25/2020   Confusion 09/22/2019   Elevated brain natriuretic peptide (BNP) level 09/26/2018   Closed fracture of distal end of radius 09/08/2018   B12  deficiency 08/29/2018   Age-related nuclear cataract, bilateral 08/28/2018   Corneal pannus of right eye 08/28/2018   Arthritis of right ankle 07/31/2018   Hyponatremia 07/18/2018   RLQ abdominal pain 06/12/2018   Generalized abdominal pain 02/25/2018   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 11/20/2016   Bipolar disorder, in partial remission,  most recent episode depressed (HCC) 11/20/2016   Bipolar 1 disorder, depressed, moderate (HCC) 05/27/2016   Rash and nonspecific skin eruption 03/21/2016   Encounter for therapeutic drug monitoring 03/02/2016   Cholecystitis with cholelithiasis 12/12/2015   Multinodular goiter 06/14/2015   Chronic pain in face 03/25/2015   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   Migraine with aura, intractable 04/03/2013   Dysesthesia 02/19/2013   Osteopenia 12/23/2012   Intractable migraine 11/14/2012   Skin lesion 09/23/2012   Hearing loss 09/23/2012   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   Atypical facial pain 05/30/2012   Facial pain 05/29/2012   Spells 11/23/2011   Chronic interstitial cystitis 11/14/2011   Parasomnia 10/01/2011   Melanoma (HCC) 07/05/2011   Preventative health care 03/01/2011   PVD 05/12/2010   Essential hypertension 12/16/2008   TIA 12/16/2008   GERD 12/06/2008   Unspecified hearing loss 09/21/2008   HYPERLIPIDEMIA 01/20/2008   ANEMIA-IRON DEFICIENCY 01/20/2008   Anxiety 01/20/2008   Depression 01/20/2008   Trigeminal neuralgia 01/20/2008   Allergic rhinitis 01/20/2008   Home Medication(s) Prior to Admission medications   Medication Sig Start Date End Date Taking? Authorizing Provider  bupivacaine,PF, (MARCAINE) 0.5 % SOLN injection Instill 15 mLs into the bladder daily. 06/04/22     buprenorphine (BUTRANS) 20 MCG/HR PTWK Place 20 mcg onto the skin every Tuesday.    [provider]  diazepam (VALIUM) 5 MG tablet Take 5 mg by mouth daily as needed. 01/25/23   [provider]  diphenhydramine-acetaminophen (TYLENOL PM) 25-500 MG TABS tablet Take 1 tablet by mouth at bedtime as needed.    [provider]  Esketamine HCl (SPRAVATO, 84 MG DOSE, NA) Place into the nose once a week.    [provider]  estradiol (ESTRACE) 0.1 MG/GM vaginal cream SMARTSIG:1 Applicator Vaginal Daily 12/02/22   [provider]  gabapentin (NEURONTIN)  600 MG tablet Take 1,800 mg by mouth 2 (two) times daily.    [provider]  heparin 01027 UNIT/ML injection Instill 4 mLs (40,000 Units total) into the bladder daily. 06/04/22     hydrOXYzine (ATARAX) 25 MG tablet Take 25 mg by mouth at bedtime.    [provider]  levothyroxine (SYNTHROID) 50 MCG tablet TAKE 1 TABLET BY MOUTH EVERY DAY 01/18/23   Plotnikov, Georgina Quint, MD  lubiprostone (AMITIZA) 8 MCG capsule Take 8 mcg by mouth 2 (two) times daily. 05/01/22   [provider]  methylPREDNISolone (MEDROL DOSEPAK) 4 MG TBPK tablet As directed 04/18/23   Plotnikov, Georgina Quint, MD  metoprolol tartrate (LOPRESSOR) 50 MG tablet TAKE 1 TABLET BY MOUTH TWICE A DAY 04/16/23   Plotnikov, Georgina Quint, MD  mirabegron ER (MYRBETRIQ) 25 MG TB24 tablet Take 1 tablet by mouth daily. Patient not taking: Reported on 10/11/2022 09/14/22     naloxone (NARCAN) nasal spray 4 mg/0.1 mL Place 1 spray into the nose as needed (for overdose). 06/12/18   [provider]  neomycin-polymyxin-hydrocortisone (CORTISPORIN) OTIC solution Place 3 drops into the right ear 3 (three) times daily. 04/18/23 07/27/23  Plotnikov, Georgina Quint, MD  NUCYNTA 100 MG TABS Take 100 mg by mouth as needed (facial pain).  12/09/15   [provider]  tiZANidine (ZANAFLEX) 4 MG tablet Take 1 tablet (4 mg total) by mouth every 8 (eight) hours as needed for muscle spasms. 04/18/23   Plotnikov, Georgina Quint, MD  TRINTELLIX 20 MG TABS tablet Take 25 mg by mouth daily. 07/07/20   [provider]  valACYclovir (VALTREX) 1000 MG tablet Take 1 tablet (1,000 mg total) by mouth 3 (three) times daily. 04/18/23   Plotnikov, Georgina Quint, MD                                                                                                                                    Past Surgical History Past Surgical History:  Procedure Laterality Date   ABDOMINAL HYSTERECTOMY  2000   APPENDECTOMY  2000   BRAIN SURGERY     BREAST BIOPSY  Left 1994   Benign   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/ [facility]   CHOLECYSTECTOMY N/A 12/13/2015   Procedure: LAPAROSCOPIC CHOLECYSTECTOMY;  Surgeon: De Blanch Kinsinger, MD;  Location: [facility] OR;  Service: General;  Laterality: N/A;   COLONOSCOPY     x 2   INGUINAL HERNIA REPAIR Right 1987   MELANOMA EXCISION  2015   "skin of my stomach"   OOPHORECTOMY Bilateral 2000   OPEN REDUCTION INTERNAL FIXATION (ORIF) DISTAL RADIAL FRACTURE Right 10/01/2018   Procedure: OPEN REDUCTION INTERNAL FIXATION (ORIF) DISTAL RADIAL FRACTURE;  Surgeon: Bradly Bienenstock, MD;  Location: [facility] OR;  Service: Orthopedics;  Laterality: Right;  75 minutes   Family History Family History  Problem Relation Age of Onset   Hyperlipidemia Mother    Hypertension Mother    Cancer - Cervical Mother    Diabetes Father    Melanoma Father    Anxiety disorder Father    Depression Father    Melanoma Sister    Anxiety disorder Sister    Depression Sister    Heart disease Paternal Grandmother    Anxiety disorder Brother    Depression Brother    Breast cancer Paternal Aunt    Heart disease Other        Mother side of family-several uncles    Anxiety disorder Sister    Depression Sister    Colon cancer Neg Hx    Stomach cancer Neg Hx     Social History Social History   Tobacco Use   Smoking status: Never   Smokeless tobacco: Never  Vaping Use   Vaping status: Never Used  Substance Use Topics   Alcohol use: No   Drug use: No   Allergies Aripiprazole, Fentanyl, Augmentin [amoxicillin-pot clavulanate], Irbesartan, Niacin and related, Other, Sulfamethoxazole-trimethoprim, Bupropion, and Ibuprofen  Review of Systems Review of Systems  HENT:  Positive for sore throat.   Neurological:  Positive for headaches.       Disequilibrium    Physical Exam Vital Signs  I have reviewed the triage vital signs BP Marland Kitchen)  145/98   Pulse 89   Temp 97.7 F (36.5 C) (Oral)   Resp 16   Ht 5\' 7"   (1.702 m)   Wt 62.6 kg   SpO2 99%   BMI 21.61 kg/m   Physical Exam Vitals and nursing note reviewed.  Constitutional:      General: She is not in acute distress.    Appearance: She is well-developed.  HENT:     Head: Normocephalic and atraumatic.  Eyes:     Conjunctiva/sclera: Conjunctivae normal.  Cardiovascular:     Rate and Rhythm: Normal rate and regular rhythm.     Heart sounds: No murmur heard. Pulmonary:     Effort: Pulmonary effort is normal. No respiratory distress.     Breath sounds: Normal breath sounds.  Abdominal:     Palpations: Abdomen is soft.     Tenderness: There is no abdominal tenderness.  Musculoskeletal:        General: No swelling.     Cervical back: Neck supple.  Skin:    General: Skin is warm and dry.     Capillary Refill: Capillary refill takes less than 2 seconds.  Neurological:     Mental Status: She is alert.     Cranial Nerves: No cranial nerve deficit.     Sensory: No sensory deficit.     Motor: No weakness.  Psychiatric:        Mood and Affect: Mood normal.     ED Results and Treatments Labs (all labs ordered are listed, but only abnormal results are displayed) Labs Reviewed  COMPREHENSIVE METABOLIC PANEL - Abnormal; Notable for the following components:      Result Value   Total Bilirubin 0.2 (*)    All other components within normal limits  SARS CORONAVIRUS 2 BY RT PCR  GROUP A STREP BY PCR  CBC WITH DIFFERENTIAL/PLATELET  BRAIN NATRIURETIC PEPTIDE  TROPONIN I (HIGH SENSITIVITY)  TROPONIN I (HIGH SENSITIVITY)                                                                                                                          Radiology CT Head Wo Contrast  Result Date: 05/06/2023 CLINICAL DATA:  Right ear pain and altered mental status EXAM: CT HEAD WITHOUT CONTRAST TECHNIQUE: Contiguous axial images were obtained from the base of the skull through the vertex without intravenous contrast. RADIATION DOSE REDUCTION: This exam  was performed according to the departmental dose-optimization program which includes automated exposure control, adjustment of the mA and/or kV according to patient size and/or use of iterative reconstruction technique. COMPARISON:  Brain MRI 11/04/2019 FINDINGS: Brain: There is no mass, hemorrhage or extra-axial collection. The size and configuration of the ventricles and extra-axial CSF spaces are normal. The brain parenchyma is normal, without acute or chronic infarction. Vascular: No abnormal hyperdensity of the major intracranial arteries or dural venous sinuses. No intracranial atherosclerosis. Skull: Right retromastoid craniectomy Sinuses/Orbits: No fluid levels or advanced mucosal thickening of the visualized  paranasal sinuses. No mastoid or middle ear effusion. The orbits are normal. IMPRESSION: 1. No acute intracranial abnormality. Electronically Signed   By: Deatra Robinson M.D.   On: 05/06/2023 20:30    Pertinent labs & imaging results that were available during my care of the patient were reviewed by me and considered in my medical decision making (see MDM for details).  Medications Ordered in ED Medications  diphenhydrAMINE (BENADRYL) injection 25 mg (25 mg Intravenous Given 05/06/23 2033)  lactated ringers bolus 1,000 mL (0 mLs Intravenous Stopped 05/06/23 2243)  droperidol (INAPSINE) 2.5 MG/ML injection 1.25 mg (1.25 mg Intravenous Given 05/06/23 2034)  LORazepam (ATIVAN) injection 1 mg (1 mg Intravenous Given 05/06/23 2144)                                                                                                                                     Procedures Procedures  (including critical care time)  Medical Decision Making / ED Course   This patient presents to the ED for concern of headache, sore throat, disequilibrium, ear pain, this involves an extensive number of treatment options, and is a complaint that carries with it a high risk of complications and morbidity.  The  differential diagnosis includes trigeminal neuralgia, strep throat, viral illness, intracranial bleed, intracranial mass, electrolyte abnormality  MDM: Patient seen emerged from for evaluation of multiple complaints described above.  Physical exam is unremarkable with no focal motor or sensory deficits.  No dysdiadochokinesia.  Laboratory evaluation is reassuringly unremarkable.  COVID-negative, strep negative.  CT head is unremarkable.  I did attempt neuropathic pain control with droperidol, Benadryl and Ativan and on reevaluation she states that her symptoms have not entirely improved but she does not want stay in the emergency department longer for any additional testing and would like to go home.  At this time with overall negative workup this is not unreasonable and she will need to follow-up with her scheduled appointments.  She was given return precautions of which she was understanding was able to ambulate in the emergency department without difficulty.  Patient then discharged with outpatient follow-up.   Additional history obtained: -Additional history obtained from husband -External records from outside source obtained and reviewed including: Chart review including previous notes, labs, imaging, consultation notes   Lab Tests: -I ordered, reviewed, and interpreted labs.   The pertinent results include:   Labs Reviewed  COMPREHENSIVE METABOLIC PANEL - Abnormal; Notable for the following components:      Result Value   Total Bilirubin 0.2 (*)    All other components within normal limits  SARS CORONAVIRUS 2 BY RT PCR  GROUP A STREP BY PCR  CBC WITH DIFFERENTIAL/PLATELET  BRAIN NATRIURETIC PEPTIDE  TROPONIN I (HIGH SENSITIVITY)  TROPONIN I (HIGH SENSITIVITY)         Imaging Studies ordered: I ordered imaging studies including CT head I independently visualized and interpreted imaging. I agree  with the radiologist interpretation   Medicines ordered and prescription drug  management: Meds ordered this encounter  Medications   DISCONTD: prochlorperazine (COMPAZINE) injection 10 mg   diphenhydrAMINE (BENADRYL) injection 25 mg   lactated ringers bolus 1,000 mL   droperidol (INAPSINE) 2.5 MG/ML injection 1.25 mg   LORazepam (ATIVAN) injection 1 mg    -I have reviewed the patients home medicines and have made adjustments as needed  Critical interventions none   Cardiac Monitoring: The patient was maintained on a cardiac monitor.  I personally viewed and interpreted the cardiac monitored which showed an underlying rhythm of: NSR  Social Determinants of Health:  Factors impacting patients care include: none   Reevaluation: After the interventions noted above, I reevaluated the patient and found that they have :stayed the same  Co morbidities that complicate the patient evaluation  Past Medical History:  Diagnosis Date   ALLERGIC RHINITIS 01/20/2008   ANEMIA-IRON DEFICIENCY 01/20/2008   ANXIETY 01/20/2008   Arthritis    ASTHMATIC BRONCHITIS, ACUTE 05/19/2008   Bipolar disorder (HCC)    CENTRAL SLEEP APNEA CONDS CLASSIFIED ELSEWHERE 08/31/2010   Complication of anesthesia 1987   "w/one of the anesthesia agents that they don't use anymore; chest muscles constricted; felt like I couldn't breath"   DEPRESSION 01/20/2008   DYSPHAGIA UNSPECIFIED 12/06/2008   ELEVATED BLOOD PRESSURE WITHOUT DIAGNOSIS OF HYPERTENSION 12/06/2008   GERD    History of sleep walking    HYPERLIPIDEMIA 01/20/2008   HYPERTENSION 12/16/2008   Hypothyroidism    Melanoma (HCC) 2012   "skin of my stomach"   Migraine    "related to menses; went away after hysterectomy"   OSA treated with BiPAP    OTITIS MEDIA, ACUTE, BILATERAL 12/16/2008   OTITIS MEDIA, SEROUS, CHRONIC 12/30/2008   PVD 05/12/2010   Pyelonephritis "hospitalized in ~ 1991"   SINUSITIS- ACUTE-NOS 09/21/2008   SWELLING MASS OR LUMP IN HEAD AND NECK 09/21/2008   THYROID NODULE, LEFT    "unable to aspirate; has gotten very  small"    Trigeminal neuralgia 01/20/2008   right side o face   Unspecified hearing loss 09/21/2008   VENOUS INSUFFICIENCY, LEGS 05/03/2010      Dispostion: I considered admission for this patient, but at this time with negative workup she is safe for discharge with outpatient follow-up and return precautions of which she and her husband voiced understanding     Final Clinical Impression(s) / ED Diagnoses Final diagnoses:  Trigeminal neuralgia  Balance problem     @PCDICTATION @    Glendora Score, MD 05/06/23 2336

## 2023-05-06 NOTE — ED Triage Notes (Addendum)
PT reports lots of mucous in her throat and is SHOB. Pt is panting when breathing. Husband repeats multiple times Pt is just as weak as a kitten. Pt almost fell multiple times today. PT also reports bil ear pain.

## 2023-05-06 NOTE — Discharge Instructions (Signed)
Numerous symptoms including weakness and tingling along the left side, headache, several days of almost no p.o. intake, several weeks of significant body wide weakness, vomiting, hypertension, shortness of breath.  Feel that with all these symptoms she needs a more thorough workup in the emergency room.

## 2023-05-06 NOTE — ED Notes (Signed)
Husband reported to Provider Pt has fallen multiple times . Pt reported having tingling on Lt side over the past weeks. Provider request Pt to be transported to ED for further work up of stroke SX's

## 2023-05-20 ENCOUNTER — Ambulatory Visit: Payer: PRIVATE HEALTH INSURANCE | Admitting: Internal Medicine

## 2023-05-26 ENCOUNTER — Ambulatory Visit (HOSPITAL_COMMUNITY): Payer: Self-pay

## 2023-05-27 ENCOUNTER — Ambulatory Visit (HOSPITAL_COMMUNITY): Payer: Self-pay

## 2023-06-19 ENCOUNTER — Ambulatory Visit: Payer: Medicare Other | Admitting: Internal Medicine

## 2023-06-19 ENCOUNTER — Encounter: Payer: Self-pay | Admitting: Internal Medicine

## 2023-06-19 ENCOUNTER — Ambulatory Visit (INDEPENDENT_AMBULATORY_CARE_PROVIDER_SITE_OTHER): Payer: Medicare Other | Admitting: Internal Medicine

## 2023-06-19 VITALS — BP 140/80 | HR 77 | Temp 98.7°F | Ht 67.0 in | Wt 128.0 lb

## 2023-06-19 DIAGNOSIS — R519 Headache, unspecified: Secondary | ICD-10-CM

## 2023-06-19 DIAGNOSIS — K120 Recurrent oral aphthae: Secondary | ICD-10-CM

## 2023-06-19 DIAGNOSIS — F339 Major depressive disorder, recurrent, unspecified: Secondary | ICD-10-CM

## 2023-06-19 DIAGNOSIS — K146 Glossodynia: Secondary | ICD-10-CM | POA: Diagnosis not present

## 2023-06-19 DIAGNOSIS — G500 Trigeminal neuralgia: Secondary | ICD-10-CM

## 2023-06-19 MED ORDER — TRIAMCINOLONE ACETONIDE 0.1 % MT PSTE: PASTE | OROMUCOSAL | 2 refills | Status: DC

## 2023-06-19 MED ORDER — METHYLPREDNISOLONE ACETATE 80 MG/ML IJ SUSP
80.0000 mg | Freq: Once | INTRAMUSCULAR | Status: AC
Start: 2023-06-19 — End: 2023-06-19
  Administered 2023-06-19: 80 mg via INTRAMUSCULAR

## 2023-06-19 MED ORDER — VALACYCLOVIR HCL 1 G PO TABS: 1000.0000 mg | ORAL_TABLET | Freq: Three times a day (TID) | ORAL | 0 refills | Status: AC

## 2023-06-19 MED ORDER — KETOROLAC TROMETHAMINE 60 MG/2ML IM SOLN
60.0000 mg | Freq: Once | INTRAMUSCULAR | Status: AC
Start: 2023-06-19 — End: 2023-06-19
  Administered 2023-06-19: 60 mg via INTRAMUSCULAR

## 2023-06-19 NOTE — Patient Instructions (Addendum)
Use [employer] Peroxicare tooth paste  

## 2023-06-19 NOTE — Progress Notes (Unsigned)
Subjective:  Patient ID: Sheryl Lester, female    DOB: 08-19-1956  Age: 67 y.o. MRN: [id number]  CC: Pain (Pt states the pain in her face and rt ear has gotten worse. Pt states she is also having pain in her throat and hands.)   HPI Sheryl Lester presents for pain in her face and rt ear has gotten worse. Pt states she is also having burning pain in her mouth, throat and hands x 6 weeks. C/o wt loss  Outpatient Medications Prior to Visit  Medication Sig Dispense Refill  . bupivacaine,PF, (MARCAINE) 0.5 % SOLN injection Instill 15 mLs into the bladder daily. 450 mL 11  . buprenorphine (BUTRANS) 20 MCG/HR PTWK Place 20 mcg onto the skin every Tuesday.    . buprenorphine-naloxone (SUBOXONE) 8-2 mg SUBL SL tablet Place 1 tablet under the tongue 2 (two) times daily.    . diazepam (VALIUM) 5 MG tablet Take 5 mg by mouth daily as needed.    . diphenhydramine-acetaminophen (TYLENOL PM) 25-500 MG TABS tablet Take 1 tablet by mouth at bedtime as needed.    . Esketamine HCl (SPRAVATO, 84 MG DOSE, NA) Place into the nose once a week.    . estradiol (ESTRACE) 0.1 MG/GM vaginal cream SMARTSIG:1 Applicator Vaginal Daily    . gabapentin (NEURONTIN) 600 MG tablet Take 1,800 mg by mouth 2 (two) times daily.    . heparin 69629 UNIT/ML injection Instill 4 mLs (40,000 Units total) into the bladder daily. 120 mL 11  . hydrOXYzine (ATARAX) 25 MG tablet Take 25 mg by mouth at bedtime.    Marland Kitchen levothyroxine (SYNTHROID) 50 MCG tablet TAKE 1 TABLET BY MOUTH EVERY DAY 90 tablet 2  . lubiprostone (AMITIZA) 8 MCG capsule Take 8 mcg by mouth 2 (two) times daily.    . methadone (DOLOPHINE) 5 MG tablet Take 5 mg by mouth 3 (three) times daily.    . methylPREDNISolone (MEDROL DOSEPAK) 4 MG TBPK tablet As directed 21 tablet 0  . metoprolol tartrate (LOPRESSOR) 50 MG tablet TAKE 1 TABLET BY MOUTH TWICE A DAY 180 tablet 1  . naloxone (NARCAN) nasal spray 4 mg/0.1 mL Place 1 spray into the nose as needed (for overdose).     Marland Kitchen neomycin-polymyxin-hydrocortisone (CORTISPORIN) OTIC solution Place 3 drops into the right ear 3 (three) times daily. 10 mL 3  . NUCYNTA 100 MG TABS Take 100 mg by mouth as needed (facial pain).     . pregabalin (LYRICA) 50 MG capsule Take 50 mg by mouth 2 (two) times daily.    Marland Kitchen tiZANidine (ZANAFLEX) 4 MG tablet Take 1 tablet (4 mg total) by mouth every 8 (eight) hours as needed for muscle spasms. 30 tablet 1  . TRINTELLIX 20 MG TABS tablet Take 25 mg by mouth daily.    . valACYclovir (VALTREX) 1000 MG tablet Take 1 tablet (1,000 mg total) by mouth 3 (three) times daily. 21 tablet 0  . mirabegron ER (MYRBETRIQ) 25 MG TB24 tablet Take 1 tablet by mouth daily. (Patient not taking: Reported on 10/11/2022) 30 tablet 2   No facility-administered medications prior to visit.    ROS: Review of Systems  Constitutional:  Positive for fatigue. Negative for activity change, appetite change, chills and unexpected weight change.  HENT:  Positive for sore throat. Negative for congestion, dental problem, mouth sores, sinus pressure and trouble swallowing.   Eyes:  Negative for visual disturbance.  Respiratory:  Negative for cough and chest tightness.   Cardiovascular:  Negative  for leg swelling.  Gastrointestinal:  Negative for abdominal pain and nausea.  Genitourinary:  Negative for difficulty urinating, frequency and vaginal pain.  Musculoskeletal:  Negative for back pain and gait problem.  Skin:  Negative for pallor and rash.  Neurological:  Negative for dizziness, tremors, weakness, numbness and headaches.  Psychiatric/Behavioral:  Negative for confusion and sleep disturbance.     Objective:  BP (!) 140/80 (BP Location: Left Arm, Patient Position: Sitting, Cuff Size: Normal)   Pulse 77   Temp 98.7 F (37.1 C) (Oral)   Ht 5\' 7"  (1.702 m)   Wt 128 lb (58.1 kg)   SpO2 97%   BMI 20.05 kg/m   BP Readings from Last 3 Encounters:  06/19/23 (!) 140/80  05/06/23 (!) 145/98  05/06/23 (!)  150/104    Wt Readings from Last 3 Encounters:  06/19/23 128 lb (58.1 kg)  05/06/23 138 lb (62.6 kg)  04/18/23 138 lb (62.6 kg)    Physical Exam Constitutional:      General: She is not in acute distress.    Appearance: She is well-developed. She is ill-appearing. She is not toxic-appearing.  HENT:     Head: Normocephalic.     Right Ear: External ear normal.     Left Ear: External ear normal.     Nose: Nose normal.  Eyes:     General:        Right eye: No discharge.        Left eye: No discharge.     Conjunctiva/sclera: Conjunctivae normal.     Pupils: Pupils are equal, round, and reactive to light.  Neck:     Thyroid: No thyromegaly.     Vascular: No JVD.     Trachea: No tracheal deviation.  Cardiovascular:     Rate and Rhythm: Normal rate and regular rhythm.     Heart sounds: Normal heart sounds.  Pulmonary:     Effort: No respiratory distress.     Breath sounds: No stridor. No wheezing.  Abdominal:     General: Bowel sounds are normal. There is no distension.     Palpations: Abdomen is soft. There is no mass.     Tenderness: There is no abdominal tenderness. There is no guarding or rebound.  Musculoskeletal:        General: No tenderness.     Cervical back: Normal range of motion and neck supple. No rigidity.     Right lower leg: No edema.     Left lower leg: No edema.  Lymphadenopathy:     Cervical: No cervical adenopathy.  Skin:    Findings: No erythema or rash.  Neurological:     Mental Status: She is oriented to person, place, and time.     Cranial Nerves: No cranial nerve deficit.     Motor: No abnormal muscle tone.     Coordination: Coordination normal.     Deep Tendon Reflexes: Reflexes normal.  Psychiatric:        Behavior: Behavior normal.        Thought Content: Thought content normal.        Judgment: Judgment normal.  Tearful Dry mouth 2-3 ulcers on the hard palate Scull - NT, but sensitive    A total time of 45 minutes was spent preparing  to see the patient, reviewing tests, x-rays, operative reports and other medical records.  Also, obtaining history and performing comprehensive physical exam.  Additionally, counseling the patient regarding the above listed issues.   Finally,  documenting clinical information in the health records, coordination of care, educating the patient -  chronic pain. It is a complex case.   Lab Results  Component Value Date   WBC 6.7 05/06/2023   HGB 12.4 05/06/2023   HCT 38.2 05/06/2023   PLT 240 05/06/2023   GLUCOSE 86 05/06/2023   CHOL 202 (H) 09/22/2019   TRIG 78.0 09/22/2019   HDL 53.20 09/22/2019   LDLDIRECT 116.2 02/12/2013   LDLCALC 133 (H) 09/22/2019   ALT 22 05/06/2023   AST 18 05/06/2023   NA 138 05/06/2023   K 3.9 05/06/2023   CL 102 05/06/2023   CREATININE 0.73 05/06/2023   BUN 12 05/06/2023   CO2 23 05/06/2023   TSH 0.62 04/18/2023   INR 0.97 05/25/2018   HGBA1C 5.8 09/22/2019    CT Head Wo Contrast  Result Date: 05/06/2023 CLINICAL DATA:  Right ear pain and altered mental status EXAM: CT HEAD WITHOUT CONTRAST TECHNIQUE: Contiguous axial images were obtained from the base of the skull through the vertex without intravenous contrast. RADIATION DOSE REDUCTION: This exam was performed according to the departmental dose-optimization program which includes automated exposure control, adjustment of the mA and/or kV according to patient size and/or use of iterative reconstruction technique. COMPARISON:  Brain MRI 11/04/2019 FINDINGS: Brain: There is no mass, hemorrhage or extra-axial collection. The size and configuration of the ventricles and extra-axial CSF spaces are normal. The brain parenchyma is normal, without acute or chronic infarction. Vascular: No abnormal hyperdensity of the major intracranial arteries or dural venous sinuses. No intracranial atherosclerosis. Skull: Right retromastoid craniectomy Sinuses/Orbits: No fluid levels or advanced mucosal thickening of the visualized  paranasal sinuses. No mastoid or middle ear effusion. The orbits are normal. IMPRESSION: 1. No acute intracranial abnormality. Electronically Signed   By: Deatra Robinson M.D.   On: 05/06/2023 20:30    Assessment & Plan:   Problem List Items Addressed This Visit     Trigeminal neuralgia    Neurology ref       Relevant Medications   pregabalin (LYRICA) 50 MG capsule   Burning mouth syndrome - Primary    Worse  Ulcers Trial of Valtrex Triamc paste       Canker sores oral    Valtrex po Depo-medrol 80 mg IM Toradol IM         Meds ordered this encounter  Medications  . valACYclovir (VALTREX) 1000 MG tablet    Sig: Take 1 tablet (1,000 mg total) by mouth 3 (three) times daily.    Dispense:  21 tablet    Refill:  0  . triamcinolone (KENALOG) 0.1 % paste    Sig: Use qid on mouth ulcers    Dispense:  5 g    Refill:  2      Follow-up: Return in about 2 weeks (around 07/03/2023) for a follow-up visit.  Sonda Primes, MD

## 2023-06-19 NOTE — Assessment & Plan Note (Signed)
Worsening symptoms - discussed.  Neurology/neurosurgery referral was offered. Continue with Pain clinic follow-up.

## 2023-06-19 NOTE — Assessment & Plan Note (Addendum)
Valtrex po Depo-medrol 80 mg IM Toradol IM

## 2023-06-19 NOTE — Assessment & Plan Note (Signed)
Worse  Ulcers Trial of Valtrex Triamc paste

## 2023-06-20 NOTE — Assessment & Plan Note (Signed)
Worse due to chronic pain problems Follow-up with psychiatry/secondary

## 2023-06-20 NOTE — Assessment & Plan Note (Signed)
Due to trigeminal neuralgia.  Plan-see above

## 2023-07-07 ENCOUNTER — Emergency Department (HOSPITAL_COMMUNITY)
Admission: EM | Admit: 2023-07-07 | Discharge: 2023-07-07 | Disposition: A | Discharge status: Home / Self Care | Payer: Medicare Other | Attending: Emergency Medicine | Admitting: Emergency Medicine

## 2023-07-07 ENCOUNTER — Other Ambulatory Visit: Payer: Self-pay

## 2023-07-07 ENCOUNTER — Encounter (HOSPITAL_COMMUNITY): Payer: Self-pay

## 2023-07-07 DIAGNOSIS — J029 Acute pharyngitis, unspecified: Secondary | ICD-10-CM | POA: Insufficient documentation

## 2023-07-07 LAB — CBC WITH DIFFERENTIAL/PLATELET
Abs Immature Granulocytes: 0.03 10*3/uL (ref 0.00–0.07)
Basophils Absolute: 0.1 10*3/uL (ref 0.0–0.1)
Basophils Relative: 1 %
Eosinophils Absolute: 0.1 10*3/uL (ref 0.0–0.5)
Eosinophils Relative: 1 %
HCT: 37.8 % (ref 36.0–46.0)
Hemoglobin: 12.2 g/dL (ref 12.0–15.0)
Immature Granulocytes: 0 %
Lymphocytes Relative: 16 %
Lymphs Abs: 1.5 10*3/uL (ref 0.7–4.0)
MCH: 29.6 pg (ref 26.0–34.0)
MCHC: 32.3 g/dL (ref 30.0–36.0)
MCV: 91.7 fL (ref 80.0–100.0)
Monocytes Absolute: 0.5 10*3/uL (ref 0.1–1.0)
Monocytes Relative: 5 %
Neutro Abs: 7 10*3/uL (ref 1.7–7.7)
Neutrophils Relative %: 77 %
Platelets: 385 10*3/uL (ref 150–400)
RBC: 4.12 MIL/uL (ref 3.87–5.11)
RDW: 13.7 % (ref 11.5–15.5)
WBC: 9.2 10*3/uL (ref 4.0–10.5)
nRBC: 0 % (ref 0.0–0.2)

## 2023-07-07 LAB — BASIC METABOLIC PANEL
Anion gap: 12 (ref 5–15)
BUN: 20 mg/dL (ref 8–23)
CO2: 21 mmol/L — ABNORMAL LOW (ref 22–32)
Calcium: 9.7 mg/dL (ref 8.9–10.3)
Chloride: 95 mmol/L — ABNORMAL LOW (ref 98–111)
Creatinine, Ser: 0.73 mg/dL (ref 0.44–1.00)
GFR, Estimated: >60 mL/min (ref >60)
Glucose, Bld: 172 mg/dL — ABNORMAL HIGH (ref 70–99)
Potassium: 3.7 mmol/L (ref 3.5–5.1)
Sodium: 128 mmol/L — ABNORMAL LOW (ref 135–145)

## 2023-07-07 LAB — MONONUCLEOSIS SCREEN: Mono Screen: NEGATIVE

## 2023-07-07 LAB — GROUP A STREP BY PCR: Group A Strep by PCR: NOT DETECTED

## 2023-07-07 MED ORDER — NYSTATIN 100000 UNIT/ML MT SUSP: 5.0000 mL | Freq: Three times a day (TID) | OROMUCOSAL | 0 refills | Status: AC | PRN

## 2023-07-07 MED ORDER — NYSTATIN 100000 UNIT/ML MT SUSP: 500000.0000 [IU] | Freq: Four times a day (QID) | OROMUCOSAL | 0 refills | Status: AC

## 2023-07-07 NOTE — Discharge Instructions (Signed)
You were seen in the emergency department today with continued burning pain in your mouth, throat, ears.  I have refilled your prescription for Magic mouthwash to take for pain.  Please follow the dosing and frequency instructions on the bottle.  I have also treating you for possible thrush with nystatin.  Please take as directed and follow closely with your primary care doctor in the coming week.

## 2023-07-07 NOTE — ED Provider Notes (Signed)
Emergency Department Provider Note   I have reviewed the triage vital signs and the nursing notes.   HISTORY  Chief Complaint Sore Throat   HPI Sheryl Lester is a 67 y.o. female with PMH reviewed presents emergency department for evaluation of burning pain in her throat, neck, bilateral ears.  Symptoms are slightly worse on the right.  She has had worsening pain over the past several weeks and is seeing her PCP.  She was started on Valtrex reporting that initially there were some blistered areas on the roof of her mouth.  She completed this antiviral with no improvement in symptoms.  Her mouth has been very dry.  No difficulty swallowing, breathing, eating other than increased pain.  Denies any new medications, vaccines, travel. No skin rash.    Past Medical History:  Diagnosis Date   ALLERGIC RHINITIS 01/20/2008   ANEMIA-IRON DEFICIENCY 01/20/2008   ANXIETY 01/20/2008   Arthritis    ASTHMATIC BRONCHITIS, ACUTE 05/19/2008   Bipolar disorder (HCC)    CENTRAL SLEEP APNEA CONDS CLASSIFIED ELSEWHERE 08/31/2010   Complication of anesthesia 1987   "w/one of the anesthesia agents that they don't use anymore; chest muscles constricted; felt like I couldn't breath"   DEPRESSION 01/20/2008   DYSPHAGIA UNSPECIFIED 12/06/2008   ELEVATED BLOOD PRESSURE WITHOUT DIAGNOSIS OF HYPERTENSION 12/06/2008   GERD    History of sleep walking    HYPERLIPIDEMIA 01/20/2008   HYPERTENSION 12/16/2008   Hypothyroidism    Melanoma (HCC) 2012   "skin of my stomach"   Migraine    "related to menses; went away after hysterectomy"   OSA treated with BiPAP    OTITIS MEDIA, ACUTE, BILATERAL 12/16/2008   OTITIS MEDIA, SEROUS, CHRONIC 12/30/2008   PVD 05/12/2010   Pyelonephritis "hospitalized in ~ 1991"   SINUSITIS- ACUTE-NOS 09/21/2008   SWELLING MASS OR LUMP IN HEAD AND NECK 09/21/2008   THYROID NODULE, LEFT    "unable to aspirate; has gotten very small"    Trigeminal neuralgia 01/20/2008   right side o face    Unspecified hearing loss 09/21/2008   VENOUS INSUFFICIENCY, LEGS 05/03/2010    Review of Systems  Constitutional: No fever/chills ENT: Positive burning sore throat, ear pain, and dry mouth.  Cardiovascular: Denies chest pain. Respiratory: Denies shortness of breath. Gastrointestinal: No abdominal pain.  No nausea, no vomiting.  Neurological: Positive HA.   ____________________________________________   PHYSICAL EXAM:  VITAL SIGNS: ED Triage Vitals  Encounter Vitals Group     BP 07/07/23 1008 (!) 161/93     Pulse Rate 07/07/23 1008 83     Resp 07/07/23 1008 18     Temp 07/07/23 1008 98.6 F (37 C)     Temp Source 07/07/23 1008 Oral     SpO2 07/07/23 1008 100 %     Weight 07/07/23 1012 139 lb (63 kg)     Height 07/07/23 1012 5\' 7"  (1.702 m)   Constitutional: Alert and oriented. Patient is up and ambulatory. Appears to be uncomfortable but able to provide a full history.  Eyes: Conjunctivae are normal.  Head: Atraumatic. Nose: No congestion/rhinnorhea. Mouth/Throat: Mucous membranes are dry. Thin, white appearing coating to the tongue. Widely patent oropharynx. No PTA. Clear voice. Managing oral secretions. No trismus. No temporal artery tenderness. No ulcers or blistering noted.  Neck: No stridor.   Cardiovascular: Good peripheral circulation.  Respiratory: Normal respiratory effort.   Gastrointestinal: No distention.  Musculoskeletal: No gross deformities of extremities. Neurologic:  Normal speech and language. Skin:  Skin is warm, dry and intact. No rash noted.   ____________________________________________   LABS (all labs ordered are listed, but only abnormal results are displayed)  Labs Reviewed  GROUP A STREP BY PCR  CBC WITH DIFFERENTIAL/PLATELET  MONONUCLEOSIS SCREEN  BASIC METABOLIC PANEL   ____________________________________________   PROCEDURES  Procedure(s) performed:   Procedures   ____________________________________________   INITIAL  IMPRESSION / ASSESSMENT AND PLAN / ED COURSE  Pertinent labs & imaging results that were available during my care of the patient were reviewed by me and considered in my medical decision making (see chart for details).   This patient is Presenting for Evaluation of sore throat, which does require a range of treatment options, and is a complaint that involves a high risk of morbidity and mortality.  The Differential Diagnoses include strep pharyngitis, viral pharyngitis, PTA, RPA, Mono, oral candida, etc.  Clinical Laboratory Tests Ordered, included ***  Radiologic Tests: Considered advanced neck imaging but no acute exam findings to prompt this for now.   Medical Decision Making: Summary:  Patient presents emergency department with sore throat and ear discomfort.  Your exam shows no acute findings of the external canals or TMs bilaterally.  Patient has a very dry mouth with a slight coating to the tongue, possible Candida.  Will obtain screening blood work to rule out hyperglycemia or other electrolyte derangement, mono, strep swab, and reassess. No findings to suggest an acute drug reaction such as SJS/TEN, although considered.   Reevaluation with update and discussion with   ***Considered admission***  Patient's presentation is most consistent with acute, uncomplicated illness.   Disposition:   ____________________________________________  FINAL CLINICAL IMPRESSION(S) / ED DIAGNOSES  Final diagnoses:  None     NEW OUTPATIENT MEDICATIONS STARTED DURING THIS VISIT:  New Prescriptions   No medications on file    Note:  This document was prepared using Dragon voice recognition software and may include unintentional dictation errors.  Alona Bene, MD, [facility]

## 2023-07-07 NOTE — ED Triage Notes (Signed)
Pt to ED via [facility] from home. Pt c/o throat burning, sharp pain in right ear and feeling like she has blisters in her mouth for the past month. Pt saw PCP and was prescribed valtrex. Pt states she finished this but has not seen any improvement. Pt tearful and pacing area on arrival.

## 2023-07-18 ENCOUNTER — Other Ambulatory Visit: Payer: Self-pay | Admitting: Internal Medicine

## 2023-10-26 ENCOUNTER — Other Ambulatory Visit: Payer: Self-pay | Admitting: Internal Medicine

## 2023-11-08 ENCOUNTER — Telehealth: Payer: Self-pay

## 2023-11-08 NOTE — Telephone Encounter (Signed)
 Copied from CRM [id number]. Topic: General - Other >> Nov 08, 2023 11:16 AM Rodman Pickle T wrote: Reason for CRM: patient is needing to know if dr plotnikov can do a EKG for her she would like a c all back regarding this she stated a detailed vm can be left on her answering machine

## 2023-11-11 NOTE — Telephone Encounter (Signed)
 PCP has agreed for pt to come in and have an EKG done ... Provider has stated the following "Yes.  Please schedule an office visit.  Thank you"

## 2023-11-11 NOTE — Telephone Encounter (Signed)
 Yes.  Please schedule an office visit.  Thank you

## 2023-11-27 ENCOUNTER — Ambulatory Visit: Payer: PRIVATE HEALTH INSURANCE | Admitting: Internal Medicine

## 2023-11-27 ENCOUNTER — Other Ambulatory Visit: Payer: PRIVATE HEALTH INSURANCE

## 2023-12-02 ENCOUNTER — Telehealth: Payer: Self-pay | Admitting: Internal Medicine

## 2023-12-02 ENCOUNTER — Other Ambulatory Visit

## 2023-12-24 ENCOUNTER — Other Ambulatory Visit: Payer: Self-pay | Admitting: Internal Medicine

## 2023-12-24 ENCOUNTER — Ambulatory Visit: Payer: PRIVATE HEALTH INSURANCE | Admitting: Internal Medicine

## 2023-12-24 ENCOUNTER — Encounter: Payer: Self-pay | Admitting: Internal Medicine

## 2023-12-24 VITALS — BP 142/86 | HR 55 | Temp 98.1°F | Ht 67.0 in | Wt 140.0 lb

## 2023-12-24 DIAGNOSIS — F419 Anxiety disorder, unspecified: Secondary | ICD-10-CM

## 2023-12-24 DIAGNOSIS — G629 Polyneuropathy, unspecified: Secondary | ICD-10-CM | POA: Diagnosis not present

## 2023-12-24 DIAGNOSIS — G500 Trigeminal neuralgia: Secondary | ICD-10-CM

## 2023-12-24 DIAGNOSIS — F339 Major depressive disorder, recurrent, unspecified: Secondary | ICD-10-CM

## 2023-12-24 DIAGNOSIS — E785 Hyperlipidemia, unspecified: Secondary | ICD-10-CM

## 2023-12-24 MED ORDER — B COMPLEX VITAMINS PO CAPS: 1.0000 | ORAL_CAPSULE | Freq: Every day | ORAL | Status: AC

## 2023-12-24 MED ORDER — NIFEDIPINE 10 MG PO CAPS
10.0000 mg | ORAL_CAPSULE | Freq: Three times a day (TID) | ORAL | 1 refills | Status: DC | PRN
Start: 2023-12-24 — End: 2023-12-25

## 2023-12-24 NOTE — Progress Notes (Signed)
 Subjective:  Patient ID: Sheryl Lester, female    DOB: 05-27-1956  Age: 68 y.o. MRN: [id number]  CC: Fall (Patient notes multiple falls over the winter time. Has gotten better within the spring. Also notes peripheral neuropathy flaring up within the last 4-6 weeks. Tips of fingers (left hand) have became numb within the last few weeks. Having some cramping in calf muscles. Red sores evident on hands and chest (burning).//Newly taking several medications. Specifically taking spravato, office that is administering med is concerned about patient increase in blood pressure post injection)   HPI Sheryl Lester presents for multiple falls over the winter time. Has gotten better within the spring. Also notes peripheral neuropathy flaring up within the last 4-6 weeks. Tips of fingers (left hand) have became numb within the last few weeks. Having some cramping in calf muscles. Red sores evident on hands and chest (burning).  Newly taking several medications. Specifically taking spravato, office that is administering med is concerned about patient increase in blood pressure post injection Sheryl Lester saw a neurologist at [facility], NCS test was done  Per hx:  "Sheryl Lester is a 68 y.o. female seen in consultation at the [facility] at the request of Sheryl Lester for evaluation of G50.0 (ICD-10-CM) - Trigeminal neuralgia .  Assessment:   Trigeminal neuralgia, chronic Glossopharyngeal neuralgia, recurrent 20y history TN sp decompression and many other therapies, somewhat atypical with constant burning pain V2-V3 on right, maximal in ear. Since August 2024 or later, she reported to me new onset burning pain in mouth, gums, throat to level of clavicle, bilateral but worse on right. 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  MRI brain wwo 07/2023 demonstrated enhancement near the right CBP angle and lateral displacement of her R CNV, and a meningioma near the right foramen ovale possibly affecting CNV.3. Neurosurgery feels the enhancement is postsurgical, and the meningioma is not contributing, is not offering intervention, said cannot place PNS device since she needs interval monitoring of her meningioma.   Ddx- TN +-geniculate neuralgia (ear pain) and GN, malignant burning mouth syndrome; central pain sensitization. Suspect component of chronic intractable migraine given her hx and this has likely been overshadowed by the significant TN/GN picture.  Tx- Currently on opiates (tapentadol , butrans ), gabapentin, added vimpat. Has had some improvement in burning pain since starting vimpat but does not help with other intermittent shock like pains. Future alternatives to consider - LTG, keppra.   Plan - continue vimpat 150 mg BID - check EKG (ext and internal orders provided) - if pr interval ok, plan to increase Vimpat to 200 mg BID. - referred to [facility] given complexity, referral has been faxed, she has not been called yet. - she will consider second nsgy opinion at [facility] - [facility]. Under review at [facility]. [facility] likely will not accept due to prolonged wait time. - continue emgality   Bilateral hand paresthesias Reports last month started to develop paresthesia in the bottom of here feet, as well as on the palms of her hands. Inconsistent sensory exam. Presentation not entirely consistent with neuropathy, cannot rule out CNS pathology thus will obtain cervical and brain imaging. . Of note given her trigeminal neuralgia I  am concerned for possible connective tissue disease thus we will obtain additional workup with autoimmune and infectious labs,  in addition to neuropathy labs, and a EMG.  Orders Placed This Encounter  Procedures  MRI brain with and without contrast "   Getting Spravato spray now 2/wk  Outpatient Medications Prior to Visit  Medication Sig Dispense Refill   buprenorphine  (BUTRANS ) 20 MCG/HR PTWK Place 20 mcg onto the skin every Tuesday. (Patient taking differently: Place 20 mcg onto the skin every Tuesday.  Patient now taking 30 mcg a week)     Dextromethorphan-buPROPion ER (AUVELITY) 45-105 MG TBCR Take by mouth 2 (two) times daily. One tablet in the morning and one tablet in the evening     diphenhydramine -acetaminophen  (TYLENOL  PM) 25-500 MG TABS tablet Take 1 tablet by mouth at bedtime as needed.     Esketamine HCl (SPRAVATO, 84 MG DOSE, NA) Place into the nose once a week.     gabapentin (NEURONTIN) 600 MG tablet Take 1,800 mg by mouth 2 (two) times daily. (Patient taking differently: Take 1,800 mg by mouth 2 (two) times daily. Patient notes only taking 1,200, 600 in the morning and 600 in the evening)     Galcanezumab-gnlm (EMGALITY) 120 MG/ML SOSY Inject into the skin.     hydrOXYzine  (ATARAX ) 25 MG tablet Take 25 mg by mouth at bedtime.     Lacosamide 150 MG TABS Take 150 mg by mouth 2 (two) times daily. 150 mg in the am and 150 mg in the pm     levothyroxine  (SYNTHROID ) 50 MCG tablet TAKE 1 TABLET BY MOUTH EVERY DAY 90 tablet 2   metoprolol  tartrate (LOPRESSOR ) 50 MG tablet TAKE 1 TABLET BY MOUTH TWICE A DAY 180 tablet 1   QUETIAPINE  FUMARATE PO Take 25 mg by mouth.     traMADol (ULTRAM) 50 MG tablet Take 100 mg by mouth every 6 (six) hours as needed (Patient can take up to 400 mg daily for breakthrough pain).     bupivacaine ,PF, (MARCAINE ) 0.5 % SOLN injection Instill 15 mLs into the bladder daily. 450 mL 11   buprenorphine -naloxone (SUBOXONE) 8-2 mg SUBL SL tablet Place 1 tablet under the tongue 2 (two) times daily. (Patient not taking: Reported on 12/24/2023)     diazepam (VALIUM) 5 MG tablet Take 5 mg by  mouth daily as needed. (Patient not taking: Reported on 12/24/2023)     estradiol (ESTRACE) 0.1 MG/GM vaginal cream SMARTSIG:1 Applicator Vaginal Daily (Patient not taking: Reported on 12/24/2023)     heparin  10000 UNIT/ML injection Instill 4 mLs (40,000 Units total) into the bladder daily. (Patient not taking: Reported on 12/24/2023) 120 mL 11   lubiprostone (AMITIZA) 8 MCG capsule Take 8 mcg by mouth 2 (two) times daily. (Patient not taking: Reported on 12/24/2023)     magic mouthwash (nystatin , lidocaine , diphenhydrAMINE , alum & mag hydroxide) suspension Swish and swallow 5 mLs 3 (three) times daily as needed for mouth pain. (Patient not taking: Reported on 12/24/2023) 180 mL 0   methadone (DOLOPHINE) 5 MG tablet Take 5 mg by mouth 3 (three) times daily. (Patient not taking: Reported on 12/24/2023)     methylPREDNISolone  (MEDROL  DOSEPAK) 4 MG TBPK tablet As directed (Patient not taking: Reported on 12/24/2023) 21 tablet 0   mirabegron  ER (MYRBETRIQ ) 25 MG TB24 tablet Take 1 tablet by mouth daily. (Patient not taking: Reported on 12/24/2023) 30 tablet 2   naloxone (NARCAN) nasal spray 4 mg/0.1 mL Place 1 spray into the nose as needed (  for overdose). (Patient not taking: Reported on 12/24/2023)     NUCYNTA  100 MG TABS Take 100 mg by mouth as needed (facial pain).  (Patient not taking: Reported on 12/24/2023)     pregabalin (LYRICA) 50 MG capsule Take 50 mg by mouth 2 (two) times daily. (Patient not taking: Reported on 12/24/2023)     tiZANidine  (ZANAFLEX ) 4 MG tablet TAKE 1 TABLET (4 MG TOTAL) BY MOUTH EVERY 8 (EIGHT) HOURS AS NEEDED FOR MUSCLE SPASMS (Patient not taking: Reported on 12/24/2023) 30 tablet 1   triamcinolone  (KENALOG ) 0.1 % paste Use qid on mouth ulcers (Patient not taking: Reported on 12/24/2023) 5 g 2   TRINTELLIX 20 MG TABS tablet Take 25 mg by mouth daily. (Patient not taking: Reported on 12/24/2023)     valACYclovir  (VALTREX ) 1000 MG tablet Take 1 tablet (1,000 mg total) by mouth 3 (three) times  daily. (Patient not taking: Reported on 12/24/2023) 21 tablet 0   No facility-administered medications prior to visit.    ROS: Review of Systems  Constitutional:  Negative for activity change, appetite change, chills, diaphoresis, fatigue, fever and unexpected weight change.  HENT:  Negative for congestion, dental problem, ear pain, hearing loss, mouth sores, postnasal drip, sinus pressure, sneezing, sore throat and voice change.   Eyes:  Negative for pain and visual disturbance.  Respiratory:  Negative for cough, chest tightness, wheezing and stridor.   Cardiovascular:  Negative for chest pain, palpitations and leg swelling.  Gastrointestinal:  Negative for abdominal distention, abdominal pain, blood in stool, nausea, rectal pain and vomiting.  Genitourinary:  Negative for decreased urine volume, difficulty urinating, dysuria, frequency, hematuria, menstrual problem, vaginal bleeding, vaginal discharge and vaginal pain.  Musculoskeletal:  Positive for arthralgias. Negative for back pain, gait problem, joint swelling and neck pain.  Skin:  Negative for color change, pallor, rash and wound.  Neurological:  Negative for dizziness, tremors, syncope, speech difficulty, weakness, light-headedness, numbness and headaches.  Hematological:  Negative for adenopathy.  Psychiatric/Behavioral:  Positive for dysphoric mood and sleep disturbance. Negative for behavioral problems, confusion, decreased concentration, hallucinations and suicidal ideas. The patient is nervous/anxious. The patient is not hyperactive.     Objective:  BP (!) 142/86   Pulse (!) 55   Temp 98.1 F (36.7 C)   Ht 5\' 7"  (1.702 m)   Wt 140 lb (63.5 kg)   SpO2 96%   BMI 21.93 kg/m   BP Readings from Last 3 Encounters:  12/24/23 (!) 142/86  07/07/23 (!) 161/93  06/19/23 (!) 140/80    Wt Readings from Last 3 Encounters:  12/24/23 140 lb (63.5 kg)  07/07/23 139 lb (63 kg)  06/19/23 128 lb (58.1 kg)    Physical  Exam Constitutional:      General: She is not in acute distress.    Appearance: Normal appearance. She is well-developed.  HENT:     Head: Normocephalic.     Right Ear: External ear normal.     Left Ear: External ear normal.     Nose: Nose normal.  Eyes:     General:        Right eye: No discharge.        Left eye: No discharge.     Conjunctiva/sclera: Conjunctivae normal.     Pupils: Pupils are equal, round, and reactive to light.  Neck:     Thyroid : No thyromegaly.     Vascular: No JVD.     Trachea: No tracheal deviation.  Cardiovascular:  Rate and Rhythm: Normal rate and regular rhythm.     Heart sounds: Normal heart sounds.  Pulmonary:     Effort: No respiratory distress.     Breath sounds: No stridor. No wheezing.  Abdominal:     General: Bowel sounds are normal. There is no distension.     Palpations: Abdomen is soft. There is no mass.     Tenderness: There is no abdominal tenderness. There is no guarding or rebound.  Musculoskeletal:        General: No tenderness.     Cervical back: Normal range of motion and neck supple. No rigidity.  Lymphadenopathy:     Cervical: No cervical adenopathy.  Skin:    Findings: No erythema or rash.  Neurological:     Cranial Nerves: No cranial nerve deficit.     Motor: No abnormal muscle tone.     Coordination: Coordination normal.     Deep Tendon Reflexes: Reflexes normal.  Psychiatric:        Behavior: Behavior normal.        Thought Content: Thought content normal.        Judgment: Judgment normal.      A total time of 45 minutes was spent preparing to see the patient, reviewing tests, x-rays, [facility] reports and other medical records.  Also, obtaining history and performing comprehensive physical exam.  Additionally, counseling the patient regarding the above listed issues.   Finally, documenting clinical information in the health records, coordination of care. It is a complex case.  Lab Results  Component Value Date   WBC  9.2 07/07/2023   HGB 12.2 07/07/2023   HCT 37.8 07/07/2023   PLT 385 07/07/2023   GLUCOSE 172 (H) 07/07/2023   CHOL 202 (H) 09/22/2019   TRIG 78.0 09/22/2019   HDL 53.20 09/22/2019   LDLDIRECT 116.2 02/12/2013   LDLCALC 133 (H) 09/22/2019   ALT 22 05/06/2023   AST 18 05/06/2023   NA 128 (L) 07/07/2023   K 3.7 07/07/2023   CL 95 (L) 07/07/2023   CREATININE 0.73 07/07/2023   BUN 20 07/07/2023   CO2 21 (L) 07/07/2023   TSH 0.62 04/18/2023   INR 0.97 05/25/2018   HGBA1C 5.8 09/22/2019    No results found.  Assessment & Plan:   Problem List Items Addressed This Visit     Anxiety   Getting Spravato spray now 2/wk Start B complex       Relevant Medications   Dextromethorphan-buPROPion ER (AUVELITY) 45-105 MG TBCR   Depression   Getting Spravato spray now 2/wk Nifedipine 10 mg tid for high BP      Relevant Medications   Dextromethorphan-buPROPion ER (AUVELITY) 45-105 MG TBCR   Trigeminal neuralgia   Start B complex      Relevant Medications   Lacosamide 150 MG TABS   Dextromethorphan-buPROPion ER (AUVELITY) 45-105 MG TBCR   QUETIAPINE  FUMARATE PO   Neuropathy - Primary   Start B complex Blue-Emu cream was recommended to use 2-3 times a day Gabapentin po      Other Visit Diagnoses       Dyslipidemia       Relevant Orders   CT CARDIAC SCORING (SELF PAY ONLY)         Meds ordered this encounter  Medications   b complex vitamins capsule    Sig: Take 1 capsule by mouth daily.   NIFEdipine (PROCARDIA) 10 MG capsule    Sig: Take 1 capsule (10 mg total) by mouth 3 (  three) times daily as needed.    Dispense:  90 capsule    Refill:  1      Follow-up: No follow-ups on file.  Anitra Barn, MD

## 2023-12-24 NOTE — Assessment & Plan Note (Signed)
Start B complex °

## 2023-12-24 NOTE — Assessment & Plan Note (Addendum)
 Getting Spravato spray now 2/wk Nifedipine 10 mg tid for high BP

## 2023-12-24 NOTE — Assessment & Plan Note (Signed)
 Getting Spravato spray now 2/wk Start B complex

## 2023-12-24 NOTE — Assessment & Plan Note (Signed)
 Start B complex Blue-Emu cream was recommended to use 2-3 times a day Gabapentin po

## 2023-12-24 NOTE — Patient Instructions (Addendum)
 Acute gout infrared Sauna Blanket-Sauna Blanket  Blue-Emu cream - use 2-3 times a day

## 2023-12-25 ENCOUNTER — Telehealth: Payer: Self-pay

## 2023-12-25 MED ORDER — AMLODIPINE BESYLATE 5 MG PO TABS: 2.5000 mg | ORAL_TABLET | Freq: Every day | ORAL | 3 refills | Status: DC

## 2023-12-25 NOTE — Addendum Note (Signed)
 Addended by: Verlinda Slotnick V on: 12/25/2023 11:37 PM   Modules accepted: Orders

## 2023-12-25 NOTE — Telephone Encounter (Signed)
 Copied from CRM [id number]. Topic: Clinical - Prescription Issue >> Dec 25, 2023  2:36 PM Earnestine Goes B wrote: Reason for CRM: pt called regarding prescription for amLODipine  (NORVASC ) 2.5 MG tablet , states the pharmacy states her insurance does not cover it. Pt is requesting a prescription for new medication to be sent to the pharmacy. Please call pt back at [phone]

## 2023-12-25 NOTE — Telephone Encounter (Signed)
 I will send a prescription for amlodipine  5 mg tablets.  Sheryl Lester can take 1/2 tablet daily. Thanks

## 2023-12-26 NOTE — Telephone Encounter (Signed)
 LDVM for the pt about the following "I will send a prescription for amlodipine  5 mg tablets.  Sheryl Lester can take 1/2 tablet daily. Thanks"

## 2024-01-10 ENCOUNTER — Other Ambulatory Visit: Payer: PRIVATE HEALTH INSURANCE

## 2024-01-13 ENCOUNTER — Ambulatory Visit
Admission: RE | Admit: 2024-01-13 | Discharge: 2024-01-13 | Disposition: A | Discharge status: Home / Self Care | Payer: PRIVATE HEALTH INSURANCE | Source: Ambulatory Visit | Attending: Internal Medicine | Admitting: Internal Medicine

## 2024-01-13 DIAGNOSIS — E785 Hyperlipidemia, unspecified: Secondary | ICD-10-CM

## 2024-02-02 ENCOUNTER — Ambulatory Visit: Payer: Self-pay | Admitting: Internal Medicine

## 2024-02-02 DIAGNOSIS — I2583 Coronary atherosclerosis due to lipid rich plaque: Secondary | ICD-10-CM

## 2024-02-02 DIAGNOSIS — I2510 Atherosclerotic heart disease of native coronary artery without angina pectoris: Secondary | ICD-10-CM | POA: Insufficient documentation

## 2024-02-02 DIAGNOSIS — I700 Atherosclerosis of aorta: Secondary | ICD-10-CM | POA: Insufficient documentation

## 2024-02-02 MED ORDER — ASPIRIN 81 MG PO TBEC: 81.0000 mg | DELAYED_RELEASE_TABLET | Freq: Every day | ORAL | Status: AC

## 2024-02-26 ENCOUNTER — Ambulatory Visit (INDEPENDENT_AMBULATORY_CARE_PROVIDER_SITE_OTHER): Admitting: Internal Medicine

## 2024-02-26 ENCOUNTER — Encounter: Payer: Self-pay | Admitting: Internal Medicine

## 2024-02-26 VITALS — BP 118/70 | HR 54 | Temp 98.1°F | Ht 67.0 in

## 2024-02-26 DIAGNOSIS — F339 Major depressive disorder, recurrent, unspecified: Secondary | ICD-10-CM | POA: Diagnosis not present

## 2024-02-26 DIAGNOSIS — R21 Rash and other nonspecific skin eruption: Secondary | ICD-10-CM

## 2024-02-26 DIAGNOSIS — F419 Anxiety disorder, unspecified: Secondary | ICD-10-CM | POA: Diagnosis not present

## 2024-02-26 DIAGNOSIS — L989 Disorder of the skin and subcutaneous tissue, unspecified: Secondary | ICD-10-CM

## 2024-02-26 MED ORDER — TRIAMCINOLONE ACETONIDE 0.5 % EX OINT: 1.0000 | TOPICAL_OINTMENT | Freq: Two times a day (BID) | CUTANEOUS | 3 refills | Status: DC

## 2024-02-26 NOTE — Patient Instructions (Signed)
 SABRA

## 2024-02-26 NOTE — Progress Notes (Signed)
 Subjective:  Patient ID: Sheryl Lester, female    DOB: 1956/08/06  Age: 68 y.o. MRN: [id number]  CC: Medical Management of Chronic Issues (Sores on ankles a/w redness and swelling in both legs )   HPI Sheryl Lester presents for rash on lower extremities F/u CAD, chronic pain, anxiety/depression  Outpatient Medications Prior to Visit  Medication Sig Dispense Refill   amLODipine  (NORVASC ) 5 MG tablet Take 0.5 tablets (2.5 mg total) by mouth daily. 45 tablet 3   b complex vitamins capsule Take 1 capsule by mouth daily.     bupivacaine ,PF, (MARCAINE ) 0.5 % SOLN injection Instill 15 mLs into the bladder daily. 450 mL 11   buprenorphine  (BUTRANS ) 20 MCG/HR PTWK Place 20 mcg onto the skin every Tuesday.     Dextromethorphan-buPROPion ER (AUVELITY) 45-105 MG TBCR Take by mouth 2 (two) times daily. One tablet in the morning and one tablet in the evening     diphenhydramine -acetaminophen  (TYLENOL  PM) 25-500 MG TABS tablet Take 1 tablet by mouth at bedtime as needed.     Esketamine HCl (SPRAVATO, 84 MG DOSE, NA) Place into the nose once a week.     gabapentin (NEURONTIN) 600 MG tablet Take 1,800 mg by mouth 2 (two) times daily.     Galcanezumab-gnlm (EMGALITY) 120 MG/ML SOSY Inject into the skin.     hydrOXYzine  (ATARAX ) 25 MG tablet Take 25 mg by mouth at bedtime.     Lacosamide 150 MG TABS Take 150 mg by mouth 2 (two) times daily. 150 mg in the am and 150 mg in the pm     levothyroxine  (SYNTHROID ) 50 MCG tablet TAKE 1 TABLET BY MOUTH EVERY DAY 90 tablet 2   metoprolol  tartrate (LOPRESSOR ) 50 MG tablet TAKE 1 TABLET BY MOUTH TWICE A DAY 180 tablet 1   QUETIAPINE  FUMARATE PO Take 25 mg by mouth.     traMADol (ULTRAM) 50 MG tablet Take 100 mg by mouth every 6 (six) hours as needed (Patient can take up to 400 mg daily for breakthrough pain).     aspirin  EC 81 MG tablet Take 1 tablet (81 mg total) by mouth daily. (Patient not taking: Reported on 03/12/2024)     buprenorphine -naloxone  (SUBOXONE) 8-2 mg SUBL SL tablet Place 1 tablet under the tongue 2 (two) times daily. (Patient not taking: Reported on 03/12/2024)     diazepam (VALIUM) 5 MG tablet Take 5 mg by mouth daily as needed. (Patient not taking: Reported on 03/12/2024)     estradiol (ESTRACE) 0.1 MG/GM vaginal cream SMARTSIG:1 Applicator Vaginal Daily (Patient not taking: Reported on 03/12/2024)     heparin  10000 UNIT/ML injection Instill 4 mLs (40,000 Units total) into the bladder daily. (Patient not taking: Reported on 03/12/2024) 120 mL 11   lubiprostone (AMITIZA) 8 MCG capsule Take 8 mcg by mouth 2 (two) times daily. (Patient not taking: Reported on 03/12/2024)     magic mouthwash (nystatin , lidocaine , diphenhydrAMINE , alum & mag hydroxide) suspension Swish and swallow 5 mLs 3 (three) times daily as needed for mouth pain. (Patient not taking: Reported on 03/12/2024) 180 mL 0   methadone (DOLOPHINE) 5 MG tablet Take 5 mg by mouth 3 (three) times daily. (Patient not taking: Reported on 03/12/2024)     methylPREDNISolone  (MEDROL  DOSEPAK) 4 MG TBPK tablet As directed (Patient not taking: Reported on 03/12/2024) 21 tablet 0   mirabegron  ER (MYRBETRIQ ) 25 MG TB24 tablet Take 1 tablet by mouth daily. (Patient not taking: Reported on 03/12/2024) 30 tablet  2   naloxone (NARCAN) nasal spray 4 mg/0.1 mL Place 1 spray into the nose as needed (for overdose). (Patient not taking: Reported on 03/12/2024)     NUCYNTA  100 MG TABS Take 100 mg by mouth as needed (facial pain).  (Patient not taking: Reported on 03/12/2024)     pregabalin (LYRICA) 50 MG capsule Take 50 mg by mouth 2 (two) times daily. (Patient not taking: Reported on 03/12/2024)     tiZANidine  (ZANAFLEX ) 4 MG tablet TAKE 1 TABLET (4 MG TOTAL) BY MOUTH EVERY 8 (EIGHT) HOURS AS NEEDED FOR MUSCLE SPASMS (Patient not taking: Reported on 03/12/2024) 30 tablet 1   triamcinolone  (KENALOG ) 0.1 % paste Use qid on mouth ulcers (Patient not taking: Reported on 03/12/2024) 5 g 2   TRINTELLIX 20 MG  TABS tablet Take 25 mg by mouth daily. (Patient not taking: Reported on 03/12/2024)     valACYclovir  (VALTREX ) 1000 MG tablet Take 1 tablet (1,000 mg total) by mouth 3 (three) times daily. (Patient not taking: Reported on 03/12/2024) 21 tablet 0   No facility-administered medications prior to visit.    ROS: Review of Systems  Constitutional:  Positive for fatigue. Negative for activity change, appetite change, chills and unexpected weight change.  HENT:  Negative for congestion, mouth sores and sinus pressure.   Eyes:  Negative for visual disturbance.  Respiratory:  Negative for cough and chest tightness.   Gastrointestinal:  Negative for abdominal pain and nausea.  Genitourinary:  Negative for difficulty urinating, frequency and vaginal pain.  Musculoskeletal:  Positive for back pain. Negative for gait problem.  Skin:  Positive for color change and rash. Negative for pallor and wound.  Neurological:  Negative for dizziness, tremors, weakness, numbness and headaches.  Psychiatric/Behavioral:  Negative for confusion and sleep disturbance.     Objective:  BP 118/70   Pulse (!) 54   Temp 98.1 F (36.7 C) (Oral)   Ht 5' 7 (1.702 m)   SpO2 98%   BMI 21.93 kg/m   BP Readings from Last 3 Encounters:  03/12/24 (!) 150/100  02/26/24 118/70  12/24/23 (!) 142/86    Wt Readings from Last 3 Encounters:  12/24/23 140 lb (63.5 kg)  07/07/23 139 lb (63 kg)  06/19/23 128 lb (58.1 kg)    Physical Exam Constitutional:      General: She is not in acute distress.    Appearance: She is well-developed. She is obese.  HENT:     Head: Normocephalic.     Right Ear: External ear normal.     Left Ear: External ear normal.     Nose: Nose normal.  Eyes:     General:        Right eye: No discharge.        Left eye: No discharge.     Conjunctiva/sclera: Conjunctivae normal.     Pupils: Pupils are equal, round, and reactive to light.  Neck:     Thyroid : No thyromegaly.     Vascular: No JVD.      Trachea: No tracheal deviation.  Cardiovascular:     Rate and Rhythm: Normal rate and regular rhythm.     Heart sounds: Normal heart sounds.  Pulmonary:     Effort: No respiratory distress.     Breath sounds: No stridor. No wheezing.  Abdominal:     General: Bowel sounds are normal. There is no distension.     Palpations: Abdomen is soft. There is no mass.     Tenderness: There  is no abdominal tenderness. There is no guarding or rebound.  Musculoskeletal:        General: No tenderness.     Cervical back: Normal range of motion and neck supple. No rigidity.     Right lower leg: No edema.     Left lower leg: No edema.  Lymphadenopathy:     Cervical: No cervical adenopathy.  Skin:    Findings: Erythema and lesion present. No rash.  Neurological:     Cranial Nerves: No cranial nerve deficit.     Motor: No abnormal muscle tone.     Coordination: Coordination normal.     Deep Tendon Reflexes: Reflexes normal.  Psychiatric:        Behavior: Behavior normal.        Thought Content: Thought content normal.        Judgment: Judgment normal.   Primarily excoriations on upper and lower extremities.  No primary rash elements are visible  Lab Results  Component Value Date   WBC 9.2 07/07/2023   HGB 12.2 07/07/2023   HCT 37.8 07/07/2023   PLT 385 07/07/2023   GLUCOSE 172 (H) 07/07/2023   CHOL 202 (H) 09/22/2019   TRIG 78.0 09/22/2019   HDL 53.20 09/22/2019   LDLDIRECT 116.2 02/12/2013   LDLCALC 133 (H) 09/22/2019   ALT 22 05/06/2023   AST 18 05/06/2023   NA 128 (L) 07/07/2023   K 3.7 07/07/2023   CL 95 (L) 07/07/2023   CREATININE 0.73 07/07/2023   BUN 20 07/07/2023   CO2 21 (L) 07/07/2023   TSH 0.62 04/18/2023   INR 0.97 05/25/2018   HGBA1C 5.8 09/22/2019    CT CARDIAC SCORING ([facility] LOCATIONS ONLY) Addendum Date: 01/30/2024 ADDENDUM REPORT: 01/30/2024 10:06 ADDENDUM: Ascending thoracic aorta is mildly aneurysmal measuring 4.1 cm. Recommend annual imaging followup by CTA  or MRA. This recommendation follows 2010 ACCF/AHA/AATS/ACR/ASA/SCA/SCAI/SIR/STS/SVM Guidelines for the Diagnosis and Management of Patients with Thoracic Aortic Disease. Circulation. 2010; 121: [id number]. Aortic aneurysm NOS (ICD10-I71.9) Electronically Signed   By: Juliene Balder M.D.   On: 01/30/2024 10:06   Result Date: 01/30/2024 CLINICAL DATA:  68 year old white female with history of high blood pressure and high cholesterol. Dyslipidemia. * Tracking Code: FCC * EXAM: CT CARDIAC CORONARY ARTERY CALCIUM  SCORE TECHNIQUE: Non-contrast imaging through the heart was performed using prospective ECG gating. Image post processing was performed on an independent workstation, allowing for quantitative analysis of the heart and coronary arteries. Note that this exam targets the heart and the chest was not imaged in its entirety. COMPARISON:  CT abdomen pelvis 05/16/2022 FINDINGS: CORONARY CALCIUM  SCORES: Left Main: 0 LAD: 33.1 LCx: 10.5 RCA: 0 Total Agatston Score: 43.6 MESA database percentile: 70 AORTA MEASUREMENTS: Ascending Aorta: 4.1 cm Descending Aorta: 2.3 cm OTHER FINDINGS: Atherosclerotic calcification involving the thoracic aorta. Heart size is normal. No significant pericardial effusion. Visualized mediastinal structures are normal. Images of the upper abdomen are unremarkable. Small scattered calcified granulomas in the visualized lungs. No significant airspace disease or consolidation in the visualized lungs. Mild volume loss or scarring in the left lower lobe. No acute bone abnormality. IMPRESSION: 1. Coronary calcium  score is 43.6 and this is at percentile 70 for patients of the same age, gender and race/ethnicity. 2. Aortic Atherosclerosis (ICD10-I70.0). Electronically Signed: By: Juliene Balder M.D. On: 01/30/2024 10:02    Assessment & Plan:   Problem List Items Addressed This Visit     Anxiety   Getting Spravato spray now 2/wk See other meds in the  med list On vitamin D  complex       Depression    Getting Spravato spray now 2/wk See other meds on the med list      Skin lesion - Primary   Primarily excoriations on upper and lower extremities.  No primary rash elements are visible This could be related to send is pain meds, anxiety Doubt fleabites, however, getting a flea trap would be useful Triamcinolone  cream prescribed      Rash and nonspecific skin eruption   Primarily excoriations on upper and lower extremities.  No primary rash elements are visible This could be related to send is pain meds Doubt fleabites, however, getting a flea trap would be useful Triamcinolone  cream prescribed         Meds ordered this encounter  Medications   triamcinolone  ointment (KENALOG ) 0.5 %    Sig: Apply 1 Application topically 2 (two) times daily.    Dispense:  90 g    Refill:  3      Follow-up: No follow-ups on file.  Marolyn Noel, MD

## 2024-03-12 ENCOUNTER — Ambulatory Visit

## 2024-03-12 VITALS — BP 150/100 | HR 54 | Ht 67.0 in

## 2024-03-12 DIAGNOSIS — Z1231 Encounter for screening mammogram for malignant neoplasm of breast: Secondary | ICD-10-CM | POA: Diagnosis not present

## 2024-03-12 DIAGNOSIS — Z0000 Encounter for general adult medical examination without abnormal findings: Secondary | ICD-10-CM

## 2024-03-12 DIAGNOSIS — Z23 Encounter for immunization: Secondary | ICD-10-CM

## 2024-03-12 NOTE — Patient Instructions (Addendum)
 Sheryl Lester , Thank you for taking time out of your busy schedule to complete your Annual Wellness Visit with me. I enjoyed our conversation and look forward to speaking with you again next year. I, as well as your care team,  appreciate your ongoing commitment to your health goals. Please review the following plan we discussed and let me know if I can assist you in the future. Your Game plan/ To Do List    Referrals: If you haven't heard from the office you've been referred to, please reach out to them at the phone provided.  You have an order for:  [x]   3D Mammogram     Please call for appointment:  [facility] [address] [place], [place] [address] [phone]   Make sure to wear two-piece clothing.  No lotions, powders, or deodorants the day of the appointment. Make sure to bring picture ID and insurance card.  Bring list of medications you are currently taking including any supplements.   Follow up Visits: Next Medicare AWV with our clinical staff: 03/16/2025.   Have you seen your provider in the last 6 months (3 months if uncontrolled diabetes)? Yes Next Office Visit with your provider: Patient stated that she will call to get scheduled for her yearly exam.  Last office wasd 02/26/2024.  Clinician Recommendations:  Aim for 30 minutes of exercise or brisk walking, 6-8 glasses of water, and 5 servings of fruits and vegetables each day. Remember to call the office to schedule a yearly visit with Dr. Garald.      This is a list of the screening recommended for you and due dates:  Health Maintenance  Topic Date Due   Medicare Annual Wellness Visit  Never done   Pneumococcal Vaccine for age over 49 (1 of 2 - PCV) Never done   Mammogram  09/14/2022   COVID-19 Vaccine (4 - 2024-25 season) 04/28/2023   Flu Shot  03/27/2024   DTaP/Tdap/Td vaccine (4 - Td or Tdap) 01/29/2028   Colon Cancer Screening  05/24/2032   DEXA scan (bone density measurement)   Completed   Hepatitis C Screening  Completed   Zoster (Shingles) Vaccine  Completed   Hepatitis B Vaccine  Aged Out   HPV Vaccine  Aged Out   Meningitis B Vaccine  Aged Out    Advanced directives: (Copy Requested) Please bring a copy of your health care power of attorney and living will to the office to be added to your chart at your convenience. You can mail to [facility] [address] [place], [place] [address] or email to [email or web address] Advance Care Planning is important because it:  [x]  Makes sure you receive the medical care that is consistent with your values, goals, and preferences  [x]  It provides guidance to your family and loved ones and reduces their decisional burden about whether or not they are making the right decisions based on your wishes.  Follow the link provided in your after visit summary or read over the paperwork we have mailed to you to help you started getting your Advance Directives in place. If you need assistance in completing these, please reach out to us  so that we can help you!  See attachments for Preventive Care and Fall Prevention Tips.

## 2024-03-12 NOTE — Progress Notes (Cosign Needed Addendum)
 Subjective:   Sheryl Lester is a 68 y.o. who presents for a Medicare Wellness preventive visit.  As a reminder, Annual Wellness Visits don't include a physical exam, and some assessments may be limited, especially if this visit is performed virtually. We may recommend an in-person follow-up visit with your provider if needed.  Visit Complete: In person  Persons Participating in Visit: Patient.  AWV Questionnaire: No: Patient Medicare AWV questionnaire was not completed prior to this visit.  Cardiac Risk Factors include: advanced age (>30men, >74 women);hypertension;Other (see comment);dyslipidemia, Risk factor comments: OSA     Objective:    Today's Vitals   03/12/24 0925  Height: 5' 7 (1.702 m)   Body mass index is 21.93 kg/m.     07/07/2023   10:14 AM 05/06/2023    5:45 PM 05/24/2022   10:01 AM 10/01/2018    3:32 PM 09/03/2018   11:49 PM 08/31/2018   12:04 AM 05/25/2018    6:28 PM  Advanced Directives  Does Patient Have a Medical Advance Directive? No No No No  No  No  No   Would patient like information on creating a medical advance directive?    No - Patient declined  Yes (MAU/Ambulatory/Procedural Areas - Information given)  No - Patient declined       Data saved with a previous flowsheet row definition    Current Medications (verified) Outpatient Encounter Medications as of 03/12/2024  Medication Sig   amLODipine  (NORVASC ) 5 MG tablet Take 0.5 tablets (2.5 mg total) by mouth daily.   b complex vitamins capsule Take 1 capsule by mouth daily.   bupivacaine ,PF, (MARCAINE ) 0.5 % SOLN injection Instill 15 mLs into the bladder daily.   buprenorphine  (BUTRANS ) 10 MCG/HR PTWK Place 1 patch onto the skin once a week.   buprenorphine  (BUTRANS ) 20 MCG/HR PTWK Place 20 mcg onto the skin every Tuesday.   Dextromethorphan-buPROPion ER (AUVELITY) 45-105 MG TBCR Take by mouth 2 (two) times daily. One tablet in the morning and one tablet in the evening    diphenhydramine -acetaminophen  (TYLENOL  PM) 25-500 MG TABS tablet Take 1 tablet by mouth at bedtime as needed.   Esketamine HCl (SPRAVATO, 84 MG DOSE, NA) Place into the nose once a week.   gabapentin (NEURONTIN) 600 MG tablet Take 1,800 mg by mouth 2 (two) times daily.   Galcanezumab-gnlm (EMGALITY) 120 MG/ML SOSY Inject into the skin.   hydrOXYzine  (ATARAX ) 25 MG tablet Take 25 mg by mouth at bedtime.   Lacosamide 150 MG TABS Take 150 mg by mouth 2 (two) times daily. 150 mg in the am and 150 mg in the pm   levothyroxine  (SYNTHROID ) 50 MCG tablet TAKE 1 TABLET BY MOUTH EVERY DAY   metoprolol  tartrate (LOPRESSOR ) 50 MG tablet TAKE 1 TABLET BY MOUTH TWICE A DAY   QUETIAPINE  FUMARATE PO Take 25 mg by mouth.   traMADol (ULTRAM) 50 MG tablet Take 100 mg by mouth every 6 (six) hours as needed (Patient can take up to 400 mg daily for breakthrough pain).   triamcinolone  ointment (KENALOG ) 0.5 % Apply 1 Application topically 2 (two) times daily.   aspirin  EC 81 MG tablet Take 1 tablet (81 mg total) by mouth daily. (Patient not taking: Reported on 03/12/2024)   buprenorphine -naloxone (SUBOXONE) 8-2 mg SUBL SL tablet Place 1 tablet under the tongue 2 (two) times daily. (Patient not taking: Reported on 03/12/2024)   diazepam (VALIUM) 5 MG tablet Take 5 mg by mouth daily as needed. (Patient not taking:  Reported on 03/12/2024)   estradiol (ESTRACE) 0.1 MG/GM vaginal cream SMARTSIG:1 Applicator Vaginal Daily (Patient not taking: Reported on 03/12/2024)   heparin  10000 UNIT/ML injection Instill 4 mLs (40,000 Units total) into the bladder daily. (Patient not taking: Reported on 03/12/2024)   lubiprostone (AMITIZA) 8 MCG capsule Take 8 mcg by mouth 2 (two) times daily. (Patient not taking: Reported on 03/12/2024)   magic mouthwash (nystatin , lidocaine , diphenhydrAMINE , alum & mag hydroxide) suspension Swish and swallow 5 mLs 3 (three) times daily as needed for mouth pain. (Patient not taking: Reported on 03/12/2024)    methadone (DOLOPHINE) 5 MG tablet Take 5 mg by mouth 3 (three) times daily. (Patient not taking: Reported on 03/12/2024)   methylPREDNISolone  (MEDROL  DOSEPAK) 4 MG TBPK tablet As directed (Patient not taking: Reported on 03/12/2024)   mirabegron  ER (MYRBETRIQ ) 25 MG TB24 tablet Take 1 tablet by mouth daily. (Patient not taking: Reported on 03/12/2024)   naloxone [facility]) nasal spray 4 mg/0.1 mL Place 1 spray into the nose as needed (for overdose). (Patient not taking: Reported on 03/12/2024)   NUCYNTA  100 MG TABS Take 100 mg by mouth as needed (facial pain).  (Patient not taking: Reported on 03/12/2024)   pregabalin (LYRICA) 50 MG capsule Take 50 mg by mouth 2 (two) times daily. (Patient not taking: Reported on 03/12/2024)   tiZANidine  (ZANAFLEX ) 4 MG tablet TAKE 1 TABLET (4 MG TOTAL) BY MOUTH EVERY 8 (EIGHT) HOURS AS NEEDED FOR MUSCLE SPASMS (Patient not taking: Reported on 03/12/2024)   triamcinolone  (KENALOG ) 0.1 % paste Use qid on mouth ulcers (Patient not taking: Reported on 03/12/2024)   TRINTELLIX 20 MG TABS tablet Take 25 mg by mouth daily. (Patient not taking: Reported on 03/12/2024)   valACYclovir  (VALTREX ) 1000 MG tablet Take 1 tablet (1,000 mg total) by mouth 3 (three) times daily. (Patient not taking: Reported on 03/12/2024)   No facility-administered encounter medications on file as of 03/12/2024.    Allergies (verified) Aripiprazole, Fentanyl , Augmentin  [amoxicillin -pot clavulanate], Irbesartan , Niacin and related, Other, Sulfamethoxazole, Sulfamethoxazole-trimethoprim, Bupropion, and Ibuprofen    History: Past Medical History:  Diagnosis Date   ALLERGIC RHINITIS 01/20/2008   ANEMIA-IRON DEFICIENCY 01/20/2008   ANXIETY 01/20/2008   Arthritis    ASTHMATIC BRONCHITIS, ACUTE 05/19/2008   Bipolar disorder (HCC)    CENTRAL SLEEP APNEA CONDS CLASSIFIED ELSEWHERE 08/31/2010   Complication of anesthesia 1987   w/one of the anesthesia agents that they don't use anymore; chest muscles constricted;  felt like I couldn't breath   DEPRESSION 01/20/2008   DYSPHAGIA UNSPECIFIED 12/06/2008   ELEVATED BLOOD PRESSURE WITHOUT DIAGNOSIS OF HYPERTENSION 12/06/2008   GERD    History of sleep walking    HYPERLIPIDEMIA 01/20/2008   HYPERTENSION 12/16/2008   Hypothyroidism    Melanoma (HCC) 2012   skin of my stomach   Migraine    related to menses; went away after hysterectomy   OSA treated with BiPAP    OTITIS MEDIA, ACUTE, BILATERAL 12/16/2008   OTITIS MEDIA, SEROUS, CHRONIC 12/30/2008   PVD 05/12/2010   Pyelonephritis hospitalized in ~ 1991   SINUSITIS- ACUTE-NOS 09/21/2008   SWELLING MASS OR LUMP IN HEAD AND NECK 09/21/2008   THYROID  NODULE, LEFT    unable to aspirate; has gotten very small    Trigeminal neuralgia 01/20/2008   right side o face   Unspecified hearing loss 09/21/2008   VENOUS INSUFFICIENCY, LEGS 05/03/2010   Past Surgical History:  Procedure Laterality Date   ABDOMINAL HYSTERECTOMY  2000   APPENDECTOMY  2000  BRAIN SURGERY     BREAST BIOPSY Left 1994   Benign   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/ [facility]   CHOLECYSTECTOMY N/A 12/13/2015   Procedure: LAPAROSCOPIC CHOLECYSTECTOMY;  Surgeon: Herlene Righter Kinsinger, MD;  Location: [facility] OR;  Service: General;  Laterality: N/A;   COLONOSCOPY     x 2   INGUINAL HERNIA REPAIR Right 1987   MELANOMA EXCISION  2015   skin of my stomach   OOPHORECTOMY Bilateral 2000   OPEN REDUCTION INTERNAL FIXATION (ORIF) DISTAL RADIAL FRACTURE Right 10/01/2018   Procedure: OPEN REDUCTION INTERNAL FIXATION (ORIF) DISTAL RADIAL FRACTURE;  Surgeon: Shari Easter, MD;  Location: [facility] OR;  Service: Orthopedics;  Laterality: Right;  75 minutes   Family History  Problem Relation Age of Onset   Hyperlipidemia Mother    Hypertension Mother    Cancer - Cervical Mother    Diabetes Father    Melanoma Father    Anxiety disorder Father    Depression Father    Melanoma Sister    Anxiety disorder Sister    Depression  Sister    Heart disease Paternal Grandmother    Anxiety disorder Brother    Depression Brother    Breast cancer Paternal Aunt    Heart disease Other        Mother side of family-several uncles    Anxiety disorder Sister    Depression Sister    Colon cancer Neg Hx    Stomach cancer Neg Hx    Social History   Socioeconomic History   Marital status: Married    Spouse name: Richard   Number of children: 0   Years of education: Not on file   Highest education level: Not on file  Occupational History   Occupation: disabled    Associate Professor: UNEMPLOYED  Tobacco Use   Smoking status: Never   Smokeless tobacco: Never  Vaping Use   Vaping status: Never Used  Substance and Sexual Activity   Alcohol use: No   Drug use: No   Sexual activity: Not Currently    Partners: Male    Birth control/protection: None  Other Topics Concern   Not on file  Social History Narrative   Lives with husband/2025   Social Drivers of Health   Financial Resource Strain: Not on file  Food Insecurity: Not on file  Transportation Needs: No Transportation Needs (03/12/2024)   PRAPARE - Administrator, Civil Service (Medical): No    Lack of Transportation (Non-Medical): No  Physical Activity: Inactive (03/12/2024)   Exercise Vital Sign    Days of Exercise per Week: 0 days    Minutes of Exercise per Session: 0 min  Stress: Stress Concern Present (03/12/2024)   [employer] of Occupational Health - Occupational Stress Questionnaire    Feeling of Stress: Rather much  Social Connections: Moderately Isolated (03/12/2024)   Social Connection and Isolation Panel    Frequency of Communication with Friends and Family: More than three times a week    Frequency of Social Gatherings with Friends and Family: Never    Attends Religious Services: Never    Database administrator or Organizations: No    Attends Engineer, structural: Never    Marital Status: Married    Tobacco  Counseling Counseling given: Not Answered    Clinical Intake:  Pre-visit preparation completed: Yes  Pain : No/denies pain     Nutritional Risks: Nausea/ vomitting/ diarrhea (diarrhea yesterday)  Lab Results  Component Value Date   HGBA1C 5.8 09/22/2019   HGBA1C 5.5 02/25/2018   HGBA1C 5.6 09/16/2015     How often do you need to have someone help you when you read instructions, pamphlets, or other written materials from your doctor or pharmacy?: 1 - Never  Interpreter Needed?: No  Information entered by :: Shaylon Aden, RMA   Activities of Daily Living     03/12/2024    9:21 AM  In your present state of health, do you have any difficulty performing the following activities:  Hearing? 0  Vision? 0  Difficulty concentrating or making decisions? 0  Walking or climbing stairs? 0  Dressing or bathing? 0  Doing errands, shopping? 0  Preparing Food and eating ? N  Using the Toilet? N  In the past six months, have you accidently leaked urine? N  Do you have problems with loss of bowel control? N  Managing your Medications? N  Managing your Finances? N  Housekeeping or managing your Housekeeping? N    Patient Care Team: Plotnikov, Karlynn GAILS, MD as PCP - General (Internal Medicine) Cornelius Lynwood HERO, MD as Referring Physician (Pain Medicine) Abran Norleen SAILOR, MD as Consulting Physician (Gastroenterology)  I have updated your Care Teams any recent Medical Services you may have received from other providers in the past year.     Assessment:   This is a routine wellness examination for Sheryl Lester.  Hearing/Vision screen Hearing Screening - Comments:: Has some hearing loss-per pt Vision Screening - Comments:: Wears eyeglasses/[facility]    Goals Addressed               This Visit's Progress     Patient Stated (pt-stated)        Try to get more relief from her chronic pain./2025       Depression Screen     03/12/2024    9:41 AM 12/24/2023    3:05 PM  06/19/2023    3:34 PM 04/18/2023   10:26 AM 12/21/2021    9:37 AM 05/08/2021    3:34 PM 02/25/2018    2:47 PM  PHQ 2/9 Scores  PHQ - 2 Score 4 3 6 2 2  0 5  PHQ- 9 Score 10 11 26 5 6  0 19    Fall Risk     03/12/2024    9:38 AM 12/24/2023    3:05 PM 06/19/2023    3:34 PM 10/11/2022    8:58 AM 12/21/2021    9:37 AM  Fall Risk   Falls in the past year? 1 1 1  0 1  Number falls in past yr: 1 1 0 0 1  Injury with Fall? 0 0 0 0 0  Risk for fall due to : Impaired balance/gait Impaired mobility History of fall(s) No Fall Risks History of fall(s);Impaired balance/gait  Follow up Falls evaluation completed;Falls prevention discussed Falls evaluation completed Falls evaluation completed Falls evaluation completed     MEDICARE RISK AT HOME:  Medicare Risk at Home Any stairs in or around the home?: Yes If so, are there any without handrails?: No Home free of loose throw rugs in walkways, pet beds, electrical cords, etc?: Yes Adequate lighting in your home to reduce risk of falls?: Yes Life alert?: No Use of a cane, walker or w/c?: No Grab bars in the bathroom?: Yes Shower chair or bench in shower?: No Elevated toilet seat or a handicapped toilet?: No  TIMED UP AND GO:  Was the test performed?  Yes  Length of time to ambulate 10 feet: 15 sec Gait slow and steady without use of assistive device  Cognitive Function: Declined/Normal: No cognitive concerns noted by patient or family. Patient alert, oriented, able to answer questions appropriately and recall recent events. No signs of memory loss or confusion.        Immunizations Immunization History  Administered Date(s) Administered   H1N1 10/11/2008   Influenza Inj Mdck Quad Pf 05/29/2016   Influenza Split 07/05/2011   Influenza Whole 10/25/2009   Influenza,inj,Quad PF,6-35 Mos 07/28/2020   Influenza,inj,quad, With Preservative 05/29/2016   Influenza-Unspecified 06/26/2015, 03/27/2018, 08/27/2018   PFIZER(Purple Top)SARS-COV-2  Vaccination 11/18/2019, 12/10/2019, 07/28/2020   Td 10/11/2008   Td (Adult), 2 Lf Tetanus Toxid, Preservative Free 10/11/2008   Tdap 01/28/2018   Zoster Recombinant(Shingrix ) 07/18/2018, 03/02/2019    Screening Tests Health Maintenance  Topic Date Due   Pneumococcal Vaccine: 50+ Years (1 of 2 - PCV) Never done   MAMMOGRAM  09/14/2022   COVID-19 Vaccine (4 - 2024-25 season) 04/28/2023   INFLUENZA VACCINE  03/27/2024   Medicare Annual Wellness (AWV)  03/12/2025   DTaP/Tdap/Td (4 - Td or Tdap) 01/29/2028   Colonoscopy  05/24/2032   DEXA SCAN  Completed   Hepatitis C Screening  Completed   Zoster Vaccines- Shingrix   Completed   Hepatitis B Vaccines  Aged Out   HPV VACCINES  Aged Out   Meningococcal B Vaccine  Aged Out    Health Maintenance  Health Maintenance Due  Topic Date Due   Pneumococcal Vaccine: 50+ Years (1 of 2 - PCV) Never done   MAMMOGRAM  09/14/2022   COVID-19 Vaccine (4 - 2024-25 season) 04/28/2023   Health Maintenance Items Addressed: Pneumovax vaccine given, See Nurse Notes at the end of this note  Additional Screening:  Vision Screening: Recommended annual ophthalmology exams for early detection of glaucoma and other disorders of the eye. Would you like a referral to an eye doctor? No    Dental Screening: Recommended annual dental exams for proper oral hygiene  Community Resource Referral / Chronic Care Management: CRR required this visit?  No   CCM required this visit?  No   Plan:    I have personally reviewed and noted the following in the patient's chart:   Medical and social history Use of alcohol, tobacco or illicit drugs  Current medications and supplements including opioid prescriptions. Patient is currently taking opioid prescriptions. Information provided to patient regarding non-opioid alternatives. Patient advised to discuss non-opioid treatment plan with their provider. Functional ability and status Nutritional status Physical  activity Advanced directives List of other physicians Hospitalizations, surgeries, and ER visits in previous 12 months Vitals Screenings to include cognitive, depression, and falls Referrals and appointments  In addition, I have reviewed and discussed with patient certain preventive protocols, quality metrics, and best practice recommendations. A written personalized care plan for preventive services as well as general preventive health recommendations were provided to patient.   Sheryl Lester, CMA   03/12/2024   After Visit Summary: (MyChart) Due to this being a telephonic visit, the after visit summary with patients personalized plan was offered to patient via MyChart   Notes: Patient received a Pneumovax today during office visit.Patient is up to date on all other health maintenance with no concerns to address today.  Medical screening examination/treatment/procedure(s) were performed by non-physician practitioner and as supervising physician I was immediately available for consultation/collaboration.  I agree with above. Karlynn Noel, MD

## 2024-03-16 ENCOUNTER — Encounter: Payer: Self-pay | Admitting: Internal Medicine

## 2024-03-16 NOTE — Assessment & Plan Note (Signed)
 Primarily excoriations on upper and lower extremities.  No primary rash elements are visible This could be related to send is pain meds, anxiety Doubt fleabites, however, getting a flea trap would be useful Triamcinolone  cream prescribed

## 2024-03-16 NOTE — Assessment & Plan Note (Signed)
 Getting Spravato spray now 2/wk See other meds on the med list

## 2024-03-16 NOTE — Assessment & Plan Note (Signed)
 Getting Spravato spray now 2/wk See other meds in the med list On vitamin D  complex

## 2024-03-16 NOTE — Assessment & Plan Note (Signed)
 Primarily excoriations on upper and lower extremities.  No primary rash elements are visible This could be related to send is pain meds Doubt fleabites, however, getting a flea trap would be useful Triamcinolone  cream prescribed

## 2024-04-01 ENCOUNTER — Ambulatory Visit
Admission: RE | Admit: 2024-04-01 | Discharge: 2024-04-01 | Disposition: A | Discharge status: Home / Self Care | Source: Ambulatory Visit | Attending: Internal Medicine | Admitting: Internal Medicine

## 2024-04-01 DIAGNOSIS — Z1231 Encounter for screening mammogram for malignant neoplasm of breast: Secondary | ICD-10-CM

## 2024-04-02 ENCOUNTER — Other Ambulatory Visit: Payer: Self-pay | Admitting: Internal Medicine

## 2024-04-27 ENCOUNTER — Other Ambulatory Visit: Payer: Self-pay | Admitting: Internal Medicine

## 2024-04-30 ENCOUNTER — Telehealth: Payer: Self-pay

## 2024-04-30 NOTE — Telephone Encounter (Signed)
 Copied from CRM [id number]. Topic: Clinical - Medical Advice >> Apr 30, 2024  9:54 AM Jasmin G wrote: Reason for CRM: Pt called initially to request a prescription for a COVID vaccine from her PCP, Dr. Garald, pt also requested a call back from one on Dr. Alexandra nurses to discuss which type of vaccine she wants to get, please call her back at [phone].

## 2024-04-30 NOTE — Telephone Encounter (Signed)
 LVM for patient to return call regarding what other vaccines she is referring to. Dr.Plotnikov, will you provide a script for patient to receive covid vaccine? Pharmacies are requiring this now as the vaccines are limited.

## 2024-05-01 NOTE — Telephone Encounter (Signed)
 I can provide a prescription, however I would not get a COVID-vaccine.  Thanks

## 2024-05-01 NOTE — Telephone Encounter (Signed)
 Attempted to reach patient.   At this time, your provider is unable to issue a prescription for the COVID-19 vaccine. We are actively developing protocols to ensure you receive the appropriate documentation required for vaccination. We kindly ask that you check back with our office in 14 business days for an update on the availability of this documentation. Thank you for your patience and understanding as we work to provide you with safe, accurate, and timely support.

## 2024-05-06 ENCOUNTER — Other Ambulatory Visit: Payer: Self-pay

## 2024-05-06 ENCOUNTER — Telehealth: Payer: Self-pay | Admitting: Internal Medicine

## 2024-05-06 DIAGNOSIS — Z23 Encounter for immunization: Secondary | ICD-10-CM

## 2024-05-06 MED ORDER — COVID-19 MRNA VAC-TRIS(PFIZER) 30 MCG/0.3ML IM SUSY: 0.3000 mL | PREFILLED_SYRINGE | Freq: Once | INTRAMUSCULAR | 0 refills | Status: AC

## 2024-05-06 NOTE — Telephone Encounter (Signed)
 FYI Only or Action Required?: Action required by provider: wants rx for covid vaccine..  Patient was last seen in primary care on 02/26/2024 by Plotnikov, Karlynn GAILS, MD.  Called Nurse Triage reporting No chief complaint on file..  Symptoms began today.  Interventions attempted: Nothing.  Symptoms are: stable.  Triage Disposition: No disposition on file.  Patient/caregiver understands and will follow disposition?:

## 2024-05-06 NOTE — Telephone Encounter (Unsigned)
 Copied from CRM [id number]. Topic: Clinical - Medication Refill >> May 06, 2024  1:49 PM Roselie BROCKS wrote: Medication: Covid shot  Has the patient contacted their pharmacy? Yes (Agent: If no, request that the patient contact the pharmacy for the refill. If patient does not wish to contact the pharmacy document the reason why and proceed with request.) (Agent: If yes, when and what did the pharmacy advise?)  This is the patient's preferred pharmacy:  [id number] - [place], [place] - [address] AT [facility] [address]  [place] [address] Phone: [phone] Fax: [phone]  Is this the correct pharmacy for this prescription? Yes If no, delete pharmacy and type the correct one.   Has the prescription been filled recently? No  Is the patient out of the medication? Yes  Has the patient been seen for an appointment in the last year OR does the patient have an upcoming appointment? Yes  Can we respond through MyChart? Yes  Agent: Please be advised that Rx refills may take up to 3 business days. We ask that you follow-up with your pharmacy.

## 2024-05-06 NOTE — Telephone Encounter (Signed)
 Script sent in

## 2024-05-07 ENCOUNTER — Telehealth: Payer: Self-pay | Admitting: Internal Medicine

## 2024-05-07 NOTE — Telephone Encounter (Unsigned)
 Copied from CRM #[id number]. Topic: Clinical - Medication Refill >> May 07, 2024  8:04 AM Zy'onna H wrote: Medication: Patient has called in requesting the latest - COVID-19 Vaccine  Has the patient contacted their pharmacy? Yes (Agent: If no, request that the patient contact the pharmacy for the refill. If patient does not wish to contact the pharmacy document the reason why and proceed with request.) (Agent: If yes, when and what did the pharmacy advise?)  This is the patient's preferred pharmacy:  [id number] - [place], [place] - [address] AT [facility] [address] [place] [address] Phone: [phone] Fax: [phone]  Is this the correct pharmacy for this prescription? Yes If no, delete pharmacy and type the correct one.   Has the prescription been filled recently? No  Is the patient out of the medication? Yes  Has the patient been seen for an appointment in the last year OR does the patient have an upcoming appointment? Yes  Can we respond through MyChart? Yes  Agent: Please be advised that Rx refills may take up to 3 business days. We ask that you follow-up with your pharmacy.

## 2024-05-08 ENCOUNTER — Encounter: Payer: Self-pay | Admitting: Internal Medicine

## 2024-05-10 MED ORDER — COVID-19 MRNA VAC-TRIS(PFIZER) 30 MCG/0.3ML IM SUSY: 0.3000 mL | PREFILLED_SYRINGE | Freq: Once | INTRAMUSCULAR | 0 refills | Status: AC

## 2024-05-10 NOTE — Telephone Encounter (Signed)
 Okay.  Thanks.

## 2024-05-13 MED ORDER — COVID-19 MRNA VAC-TRIS(PFIZER) 30 MCG/0.3ML IM SUSY
0.3000 mL | PREFILLED_SYRINGE | Freq: Once | INTRAMUSCULAR | 0 refills | Status: AC
Start: 2024-05-13 — End: 2024-05-13

## 2024-06-18 ENCOUNTER — Telehealth: Payer: Self-pay

## 2024-06-18 NOTE — Telephone Encounter (Signed)
 Copied from CRM #[id number]. Topic: General - Other >> Jun 18, 2024 12:20 PM Macario HERO wrote: Reason for CRM: Patient requesting a call back from Dr. Alexandra nurse.

## 2024-06-19 NOTE — Telephone Encounter (Signed)
 Pt called to follow up on her call back request as she has not heard from anyone. She has an appt on Tuesday and feels like if she can the nurse to speak to Dr Plotnikiv then she may not have to actually come in for her appt. Please call pt at [phone]

## 2024-06-22 NOTE — Telephone Encounter (Signed)
 Attempted to reach pt to discuss her concerns. Pt did not answer. LVM for pt to call the clinic in regards to the above.

## 2024-06-23 ENCOUNTER — Encounter: Payer: Self-pay | Admitting: Internal Medicine

## 2024-06-23 ENCOUNTER — Ambulatory Visit: Admitting: Internal Medicine

## 2024-06-23 VITALS — BP 142/80 | HR 49 | Temp 98.5°F | Ht 67.0 in | Wt 146.0 lb

## 2024-06-23 DIAGNOSIS — F419 Anxiety disorder, unspecified: Secondary | ICD-10-CM

## 2024-06-23 DIAGNOSIS — E039 Hypothyroidism, unspecified: Secondary | ICD-10-CM | POA: Diagnosis not present

## 2024-06-23 DIAGNOSIS — F3132 Bipolar disorder, current episode depressed, moderate: Secondary | ICD-10-CM | POA: Diagnosis not present

## 2024-06-23 DIAGNOSIS — I10 Essential (primary) hypertension: Secondary | ICD-10-CM | POA: Diagnosis not present

## 2024-06-23 DIAGNOSIS — E559 Vitamin D deficiency, unspecified: Secondary | ICD-10-CM

## 2024-06-23 MED ORDER — AMLODIPINE BESYLATE 5 MG PO TABS: 5.0000 mg | ORAL_TABLET | Freq: Every day | ORAL | Status: AC

## 2024-06-23 NOTE — Assessment & Plan Note (Signed)
Risks associated with treatment noncompliance were discussed. Compliance was encouraged. 12/21 Very low vitamin D level. On Vit D now. On 50,000 units of vitamin D weekly

## 2024-06-23 NOTE — Progress Notes (Signed)
 Subjective:  Patient ID: Sheryl Lester, female    DOB: 02-23-1956  Age: 68 y.o. MRN: [id number]  CC: Medical Management of Chronic Issues (Blood pressure. Noted while getting treatments done that the reading was (149/90). Patient has now been unable to get treatments done due to the readings)   HPI Sheryl Lester presents for elevated BP when for Spravato treatment q 2 wks  Outpatient Medications Prior to Visit  Medication Sig Dispense Refill   aspirin  EC 81 MG tablet Take 1 tablet (81 mg total) by mouth daily.     b complex vitamins capsule Take 1 capsule by mouth daily.     bupivacaine ,PF, (MARCAINE ) 0.5 % SOLN injection Instill 15 mLs into the bladder daily. 450 mL 11   buprenorphine  (BUTRANS ) 10 MCG/HR PTWK Place 1 patch onto the skin once a week.     buprenorphine  (BUTRANS ) 20 MCG/HR PTWK Place 20 mcg onto the skin every Tuesday.     Dextromethorphan-buPROPion ER (AUVELITY) 45-105 MG TBCR Take by mouth 2 (two) times daily. One tablet in the morning and one tablet in the evening     diphenhydramine -acetaminophen  (TYLENOL  PM) 25-500 MG TABS tablet Take 1 tablet by mouth at bedtime as needed.     Esketamine HCl (SPRAVATO, 84 MG DOSE, NA) Place into the nose once a week.     gabapentin (NEURONTIN) 600 MG tablet Take 1,800 mg by mouth 2 (two) times daily.     Galcanezumab-gnlm (EMGALITY) 120 MG/ML SOSY Inject into the skin.     hydrOXYzine  (ATARAX ) 25 MG tablet Take 25 mg by mouth at bedtime.     Lacosamide 150 MG TABS Take 150 mg by mouth 2 (two) times daily. 150 mg in the am and 150 mg in the pm     levothyroxine  (SYNTHROID ) 50 MCG tablet TAKE 1 TABLET BY MOUTH EVERY DAY 90 tablet 2   metoprolol  tartrate (LOPRESSOR ) 50 MG tablet TAKE 1 TABLET BY MOUTH TWICE A DAY 180 tablet 1   QUETIAPINE  FUMARATE PO Take 25 mg by mouth.     traMADol (ULTRAM) 50 MG tablet Take 100 mg by mouth every 6 (six) hours as needed (Patient can take up to 400 mg daily for breakthrough pain).      amLODipine  (NORVASC ) 5 MG tablet Take 0.5 tablets (2.5 mg total) by mouth daily. 45 tablet 3   buprenorphine -naloxone (SUBOXONE) 8-2 mg SUBL SL tablet Place 1 tablet under the tongue 2 (two) times daily. (Patient not taking: Reported on 06/23/2024)     estradiol (ESTRACE) 0.1 MG/GM vaginal cream SMARTSIG:1 Applicator Vaginal Daily (Patient not taking: Reported on 06/23/2024)     magic mouthwash (nystatin , lidocaine , diphenhydrAMINE , alum & mag hydroxide) suspension Swish and swallow 5 mLs 3 (three) times daily as needed for mouth pain. (Patient not taking: Reported on 06/23/2024) 180 mL 0   naloxone (NARCAN) nasal spray 4 mg/0.1 mL Place 1 spray into the nose as needed (for overdose). (Patient not taking: Reported on 06/23/2024)     NUCYNTA  100 MG TABS Take 100 mg by mouth as needed (facial pain).  (Patient not taking: Reported on 06/23/2024)     valACYclovir  (VALTREX ) 1000 MG tablet Take 1 tablet (1,000 mg total) by mouth 3 (three) times daily. (Patient not taking: Reported on 06/23/2024) 21 tablet 0   diazepam (VALIUM) 5 MG tablet Take 5 mg by mouth daily as needed. (Patient not taking: Reported on 06/23/2024)     heparin  10000 UNIT/ML injection Instill 4 mLs (40,000 Units  total) into the bladder daily. (Patient not taking: Reported on 06/23/2024) 120 mL 11   lubiprostone (AMITIZA) 8 MCG capsule Take 8 mcg by mouth 2 (two) times daily. (Patient not taking: Reported on 06/23/2024)     methadone (DOLOPHINE) 5 MG tablet Take 5 mg by mouth 3 (three) times daily. (Patient not taking: Reported on 06/23/2024)     methylPREDNISolone  (MEDROL  DOSEPAK) 4 MG TBPK tablet As directed (Patient not taking: Reported on 06/23/2024) 21 tablet 0   mirabegron  ER (MYRBETRIQ ) 25 MG TB24 tablet Take 1 tablet by mouth daily. (Patient not taking: Reported on 06/23/2024) 30 tablet 2   pregabalin (LYRICA) 50 MG capsule Take 50 mg by mouth 2 (two) times daily. (Patient not taking: Reported on 06/23/2024)     tiZANidine   (ZANAFLEX ) 4 MG tablet TAKE 1 TABLET (4 MG TOTAL) BY MOUTH EVERY 8 (EIGHT) HOURS AS NEEDED FOR MUSCLE SPASMS (Patient not taking: Reported on 06/23/2024) 30 tablet 1   triamcinolone  (KENALOG ) 0.1 % paste Use qid on mouth ulcers (Patient not taking: Reported on 06/23/2024) 5 g 2   triamcinolone  ointment (KENALOG ) 0.5 % Apply 1 Application topically 2 (two) times daily. (Patient not taking: Reported on 06/23/2024) 90 g 3   TRINTELLIX 20 MG TABS tablet Take 25 mg by mouth daily. (Patient not taking: Reported on 06/23/2024)     No facility-administered medications prior to visit.    ROS: Review of Systems  Constitutional:  Negative for activity change, appetite change, chills, fatigue and unexpected weight change.  HENT:  Negative for congestion, mouth sores and sinus pressure.   Eyes:  Negative for visual disturbance.  Respiratory:  Negative for cough and chest tightness.   Gastrointestinal:  Negative for abdominal pain and nausea.  Genitourinary:  Negative for difficulty urinating, frequency and vaginal pain.  Musculoskeletal:  Negative for back pain and gait problem.  Skin:  Negative for pallor and rash.  Neurological:  Negative for dizziness, tremors, weakness, numbness and headaches.  Psychiatric/Behavioral:  Positive for dysphoric mood. Negative for confusion, sleep disturbance and suicidal ideas. The patient is nervous/anxious.     Objective:  BP (!) 142/80   Pulse (!) 49   Temp 98.5 F (36.9 C)   Ht 5' 7 (1.702 m)   Wt 146 lb (66.2 kg)   SpO2 98%   BMI 22.87 kg/m   BP Readings from Last 3 Encounters:  06/23/24 (!) 142/80  03/12/24 (!) 150/100  02/26/24 118/70    Wt Readings from Last 3 Encounters:  06/23/24 146 lb (66.2 kg)  12/24/23 140 lb (63.5 kg)  07/07/23 139 lb (63 kg)    Physical Exam  Lab Results  Component Value Date   WBC 9.2 07/07/2023   HGB 12.2 07/07/2023   HCT 37.8 07/07/2023   PLT 385 07/07/2023   GLUCOSE 172 (H) 07/07/2023   CHOL 202 (H)  09/22/2019   TRIG 78.0 09/22/2019   HDL 53.20 09/22/2019   LDLDIRECT 116.2 02/12/2013   LDLCALC 133 (H) 09/22/2019   ALT 22 05/06/2023   AST 18 05/06/2023   NA 128 (L) 07/07/2023   K 3.7 07/07/2023   CL 95 (L) 07/07/2023   CREATININE 0.73 07/07/2023   BUN 20 07/07/2023   CO2 21 (L) 07/07/2023   TSH 0.62 04/18/2023   INR 0.97 05/25/2018   HGBA1C 5.8 09/22/2019    MM 3D SCREENING MAMMOGRAM BILATERAL BREAST Result Date: 04/03/2024 CLINICAL DATA:  Screening. EXAM: DIGITAL SCREENING BILATERAL MAMMOGRAM WITH TOMOSYNTHESIS AND CAD TECHNIQUE: Bilateral screening digital  craniocaudal and mediolateral oblique mammograms were obtained. Bilateral screening digital breast tomosynthesis was performed. The images were evaluated with computer-aided detection. COMPARISON:  Previous exam(s). ACR Breast Density Category b: There are scattered areas of fibroglandular density. FINDINGS: There are no findings suspicious for malignancy. IMPRESSION: No mammographic evidence of malignancy. A result letter of this screening mammogram will be mailed directly to the patient. RECOMMENDATION: Screening mammogram in one year. (Code:SM-B-01Y) BI-RADS CATEGORY  1: Negative. Electronically Signed   By: Rosina Gelineau M.D.   On: 04/03/2024 12:39    Assessment & Plan:   Problem List Items Addressed This Visit     Anxiety - Primary   Getting Spravato spray now 2/wk See other meds in the med list On vitamin D  complex   C/o elevated BP when for Spravato treatment q 2 wks Continue with Lopressor  50 mg twice daily On Norvasc  - increase Norvasc  to 5 mg/d      Essential hypertension    C/o elevated BP when for Spravato treatment q 2 wks Continue with Lopressor  50 mg twice daily On Norvasc  - increase Norvasc  to 5 mg/d      Relevant Medications   amLODipine  (NORVASC ) 5 MG tablet      Meds ordered this encounter  Medications   amLODipine  (NORVASC ) 5 MG tablet    Sig: Take 1 tablet (5 mg total) by mouth daily.       Follow-up: No follow-ups on file.  Marolyn Noel, MD

## 2024-06-23 NOTE — Assessment & Plan Note (Addendum)
 Getting Spravato spray now 2/wk See other meds in the med list On vitamin D  complex   C/o elevated BP when for Spravato treatment q 2 wks Continue with Lopressor  50 mg twice daily On Norvasc  - increase Norvasc  to 5 mg/d

## 2024-06-23 NOTE — Assessment & Plan Note (Signed)
 C/o elevated BP when for Spravato treatment q 2 wks Continue with Lopressor  50 mg twice daily On Norvasc  - increase Norvasc  to 5 mg/d

## 2024-06-23 NOTE — Assessment & Plan Note (Signed)
 Off Mirtazapine , on Spravato

## 2024-06-23 NOTE — Assessment & Plan Note (Signed)
 Check TSH

## 2024-07-10 ENCOUNTER — Ambulatory Visit: Payer: Self-pay

## 2024-07-10 NOTE — Telephone Encounter (Signed)
 FYI Only or Action Required?: FYI only for provider: UC advised.  Patient was last seen in primary care on 06/23/2024 by Plotnikov, Karlynn GAILS, MD.  Called Nurse Triage reporting Leg Swelling.  Symptoms began yesterday.  Interventions attempted: Rest, hydration, or home remedies.  Symptoms are: stable.  Triage Disposition: See Physician Within 24 Hours  Patient/caregiver understands and will follow disposition?: Yes Reason for Disposition  [1] MODERATE leg swelling (e.g., swelling extends up to knees) AND [2] new-onset or getting worse  Answer Assessment - Initial Assessment Questions Patient states they are red, tight and warm to the touch. States slept with legs elevated last night and that helped reduce some of the swelling but they are still red and feel extremely tight. No appointments available in PCP office, advised UC.   1. ONSET: When did the swelling start? (e.g., minutes, hours, days)     Yesterday  2. LOCATION: What part of the leg is swollen?  Are both legs swollen or just one leg?     Both legs, around ankles inch above and below  3. SEVERITY: How bad is the swelling? (e.g., localized; mild, moderate, severe)     Mild  4. REDNESS: Is there redness or signs of infection?     Red and tight  5. PAIN: Is the swelling painful to touch? If Yes, ask: How painful is it?   (Scale 1-10; mild, moderate or severe)     Denies, just feels really tight  6. FEVER: Do you have a fever? If Yes, ask: What is it, how was it measured, and when did it start?      Denies  7. CAUSE: What do you think is causing the leg swelling?     Unsure  8. MEDICAL HISTORY: Do you have a history of blood clots (e.g., DVT), cancer, heart failure, kidney disease, or liver failure?     Denies  9. RECURRENT SYMPTOM: Have you had leg swelling before? If Yes, ask: When was the last time? What happened that time?     Yes, has leg swelling periodically, usually happens to  right leg. Elevates legs and drink water and goes away. States its different this time due to the redness  10. OTHER SYMPTOMS: Do you have any other symptoms? (e.g., chest pain, difficulty breathing)       Denies  Protocols used: Leg Swelling and Edema-A-AH  Copied from CRM #[id number]. Topic: Clinical - Red Word Triage >> Jul 10, 2024  9:21 AM Laymon HERO wrote: Red Word that prompted transfer to Nurse Triage: Swelling in legs started 11/13 morning. Tight feeling and redness

## 2024-07-13 NOTE — Telephone Encounter (Addendum)
 Please schedule office visit with any provider.  Keep legs elevated while resting.  Thanks

## 2024-08-24 ENCOUNTER — Ambulatory Visit: Payer: Self-pay

## 2024-08-24 NOTE — Telephone Encounter (Signed)
 First attempt made to reach patient at 12:21. LM to call [phone]   Copied from CRM #[id number]. Topic: Clinical - Red Word Triage >> Aug 24, 2024 10:03 AM Emylou G wrote: [facility] that prompted transfer to Nurse Triage: offbalance >> Aug 24, 2024 10:24 AM Emylou G wrote: Patient was on hold.. [phone] called her back.. she said needed to go.. couldn't stay on hold

## 2024-08-24 NOTE — Telephone Encounter (Signed)
 FYI Only or Action Required?: FYI only for provider: appointment scheduled on Jan 6th.  Patient was last seen in primary care on 06/23/2024 by Plotnikov, Karlynn GAILS, MD.  Called Nurse Triage reporting Dizziness and Fall.Pt reports falling about 1 times per week . I also having sudden hand weakness.   Symptoms began several months ago.  Interventions attempted: Other: Has mentioned issue to PCP.SABRA  Symptoms are: gradually worsening.  Triage Disposition: See PCP Within 2 Weeks  Patient/caregiver understands and will follow disposition?: Yes                   Reason for Disposition  Dizziness is a chronic symptom (recurrent or ongoing AND present > 4 weeks)  Answer Assessment - Initial Assessment Questions 1. MECHANISM: How did the fall happen?     On the ground before she even realizes it is happening 2. DOMESTIC VIOLENCE AND ELDER ABUSE SCREENING: Did you fall because someone pushed you or tried to hurt you? If Yes, ask: Are you safe now?     no 3. ONSET: When did the fall happen? (e.g., minutes, hours, or days ago)     About 1 time a week 4. LOCATION: What part of the body hit the ground? (e.g., back, buttocks, head, hips, knees, hands, head, stomach)     knees 9. OTHER SYMPTOMS: Do you have any other symptoms? (e.g., dizziness, fever, weakness; new-onset or worsening).      Weakness in hands 10. CAUSE: What do you think caused the fall (or falling)? (e.g., dizzy spell, tripped)       Happens in the morning and gets better as the day goes on  Protocols used: Falls and [facility]

## 2024-09-01 ENCOUNTER — Ambulatory Visit: Admitting: Internal Medicine

## 2024-09-01 ENCOUNTER — Encounter: Payer: Self-pay | Admitting: Internal Medicine

## 2024-09-01 VITALS — BP 126/82 | HR 51 | Temp 98.0°F | Ht 67.0 in | Wt 143.8 lb

## 2024-09-01 DIAGNOSIS — R296 Repeated falls: Secondary | ICD-10-CM | POA: Diagnosis not present

## 2024-09-01 DIAGNOSIS — F419 Anxiety disorder, unspecified: Secondary | ICD-10-CM

## 2024-09-01 DIAGNOSIS — R270 Ataxia, unspecified: Secondary | ICD-10-CM | POA: Diagnosis not present

## 2024-09-01 DIAGNOSIS — E559 Vitamin D deficiency, unspecified: Secondary | ICD-10-CM | POA: Diagnosis not present

## 2024-09-01 DIAGNOSIS — E538 Deficiency of other specified B group vitamins: Secondary | ICD-10-CM

## 2024-09-01 DIAGNOSIS — F339 Major depressive disorder, recurrent, unspecified: Secondary | ICD-10-CM | POA: Diagnosis not present

## 2024-09-01 LAB — COMPREHENSIVE METABOLIC PANEL WITH GFR
ALT: 18 U/L (ref 3–35)
AST: 22 U/L (ref 5–37)
Albumin: 4.3 g/dL (ref 3.5–5.2)
Alkaline Phosphatase: 91 U/L (ref 39–117)
BUN: 13 mg/dL (ref 6–23)
CO2: 29 meq/L (ref 19–32)
Calcium: 9.1 mg/dL (ref 8.4–10.5)
Chloride: 101 meq/L (ref 96–112)
Creatinine, Ser: 0.75 mg/dL (ref 0.40–1.20)
GFR: 82.84 mL/min
Glucose, Bld: 84 mg/dL (ref 70–99)
Potassium: 4.6 meq/L (ref 3.5–5.1)
Sodium: 135 meq/L (ref 135–145)
Total Bilirubin: 0.4 mg/dL (ref 0.2–1.2)
Total Protein: 6.8 g/dL (ref 6.0–8.3)

## 2024-09-01 LAB — CBC WITH DIFFERENTIAL/PLATELET
Basophils Absolute: 0.1 K/uL (ref 0.0–0.1)
Basophils Relative: 0.9 % (ref 0.0–3.0)
Eosinophils Absolute: 0.4 K/uL (ref 0.0–0.7)
Eosinophils Relative: 6.3 % — ABNORMAL HIGH (ref 0.0–5.0)
HCT: 34.9 % — ABNORMAL LOW (ref 36.0–46.0)
Hemoglobin: 11.5 g/dL — ABNORMAL LOW (ref 12.0–15.0)
Lymphocytes Relative: 28.7 % (ref 12.0–46.0)
Lymphs Abs: 1.6 K/uL (ref 0.7–4.0)
MCHC: 33 g/dL (ref 30.0–36.0)
MCV: 84.9 fl (ref 78.0–100.0)
Monocytes Absolute: 0.4 K/uL (ref 0.1–1.0)
Monocytes Relative: 7.6 % (ref 3.0–12.0)
Neutro Abs: 3.1 K/uL (ref 1.4–7.7)
Neutrophils Relative %: 56.5 % (ref 43.0–77.0)
Platelets: 260 K/uL (ref 150.0–400.0)
RBC: 4.11 Mil/uL (ref 3.87–5.11)
RDW: 12.5 % (ref 11.5–15.5)
WBC: 5.6 K/uL (ref 4.0–10.5)

## 2024-09-01 LAB — VITAMIN B12: Vitamin B-12: 246 pg/mL (ref 211–911)

## 2024-09-01 LAB — URINALYSIS
Bilirubin Urine: NEGATIVE
Hgb urine dipstick: NEGATIVE
Ketones, ur: NEGATIVE
Leukocytes,Ua: NEGATIVE
Nitrite: NEGATIVE
Specific Gravity, Urine: 1.01 (ref 1.000–1.030)
Total Protein, Urine: NEGATIVE
Urine Glucose: NEGATIVE
Urobilinogen, UA: 0.2 (ref 0.0–1.0)
pH: 6 (ref 5.0–8.0)

## 2024-09-01 LAB — VITAMIN D 25 HYDROXY (VIT D DEFICIENCY, FRACTURES): VITD: 25.21 ng/mL — ABNORMAL LOW (ref 30.00–100.00)

## 2024-09-01 LAB — TSH: TSH: 0.84 u[IU]/mL (ref 0.35–5.50)

## 2024-09-01 LAB — T4, FREE: Free T4: 1.01 ng/dL (ref 0.60–1.60)

## 2024-09-01 NOTE — Assessment & Plan Note (Signed)
 On Spravato q 2 weeks

## 2024-09-01 NOTE — Patient Instructions (Signed)
 Try chair yoga or Tai Chi for balance

## 2024-09-01 NOTE — Progress Notes (Signed)
 "  Subjective:  Patient ID: Sheryl Lester, female    DOB: [birth year]  Age: 69 y.o. MRN: [id number]  CC: Fall (Multiple falls occurring within the last few months. Patient does not believe these are syncopal episodes. Falls/balance issues more frequent in the morning. /New hand weakness when using grip)   HPI Sheryl Lester presents for recurrent falls, usually w/walking x 3 months once a week at least. No LOC. I loose my balance... Brain MRI - 2024 and 03/2024 at [facility] Neurology/Pain     Outpatient Medications Prior to Visit  Medication Sig Dispense Refill   amLODipine  (NORVASC ) 5 MG tablet Take 1 tablet (5 mg total) by mouth daily.     aspirin  EC 81 MG tablet Take 1 tablet (81 mg total) by mouth daily.     b complex vitamins capsule Take 1 capsule by mouth daily.     bupivacaine ,PF, (MARCAINE ) 0.5 % SOLN injection Instill 15 mLs into the bladder daily. 450 mL 11   buprenorphine  (BUTRANS ) 10 MCG/HR PTWK Place 1 patch onto the skin once a week.     buprenorphine  (BUTRANS ) 20 MCG/HR PTWK Place 20 mcg onto the skin every Tuesday.     Dextromethorphan-buPROPion ER (AUVELITY) 45-105 MG TBCR Take by mouth 2 (two) times daily. One tablet in the morning and one tablet in the evening     diphenhydramine -acetaminophen  (TYLENOL  PM) 25-500 MG TABS tablet Take 1 tablet by mouth at bedtime as needed.     Esketamine HCl (SPRAVATO, 84 MG DOSE, NA) Place into the nose once a week.     gabapentin (NEURONTIN) 600 MG tablet Take 1,800 mg by mouth 2 (two) times daily.     Galcanezumab-gnlm (EMGALITY) 120 MG/ML SOSY Inject into the skin.     hydrOXYzine  (ATARAX ) 25 MG tablet Take 25 mg by mouth at bedtime.     Lacosamide 150 MG TABS Take 150 mg by mouth 2 (two) times daily. 150 mg in the am and 150 mg in the pm     levothyroxine  (SYNTHROID ) 50 MCG tablet TAKE 1 TABLET BY MOUTH EVERY DAY 90 tablet 2   metoprolol  tartrate (LOPRESSOR ) 50 MG tablet TAKE 1 TABLET BY MOUTH TWICE A DAY 180 tablet 1    QUETIAPINE  FUMARATE PO Take 25 mg by mouth.     traMADol (ULTRAM) 50 MG tablet Take 100 mg by mouth every 6 (six) hours as needed (Patient can take up to 400 mg daily for breakthrough pain).     buprenorphine -naloxone (SUBOXONE) 8-2 mg SUBL SL tablet Place 1 tablet under the tongue 2 (two) times daily. (Patient not taking: Reported on 09/01/2024)     estradiol (ESTRACE) 0.1 MG/GM vaginal cream SMARTSIG:1 Applicator Vaginal Daily (Patient not taking: Reported on 09/01/2024)     magic mouthwash (nystatin , lidocaine , diphenhydrAMINE , alum & mag hydroxide) suspension Swish and swallow 5 mLs 3 (three) times daily as needed for mouth pain. (Patient not taking: Reported on 09/01/2024) 180 mL 0   naloxone (NARCAN) nasal spray 4 mg/0.1 mL Place 1 spray into the nose as needed (for overdose). (Patient not taking: Reported on 09/01/2024)     NUCYNTA  100 MG TABS Take 100 mg by mouth as needed (facial pain).  (Patient not taking: Reported on 09/01/2024)     valACYclovir  (VALTREX ) 1000 MG tablet Take 1 tablet (1,000 mg total) by mouth 3 (three) times daily. (Patient not taking: Reported on 09/01/2024) 21 tablet 0   No facility-administered medications prior to visit.    ROS: Review  of Systems  Constitutional:  Positive for fatigue. Negative for activity change, appetite change, chills and unexpected weight change.  HENT:  Negative for congestion, mouth sores and sinus pressure.   Eyes:  Negative for visual disturbance.  Respiratory:  Negative for cough and chest tightness.   Gastrointestinal:  Negative for abdominal pain and nausea.  Genitourinary:  Negative for difficulty urinating, frequency and vaginal pain.  Musculoskeletal:  Positive for gait problem. Negative for back pain.  Skin:  Negative for pallor and rash.  Neurological:  Positive for dizziness and weakness. Negative for tremors, syncope, numbness and headaches.  Hematological:  Does not bruise/bleed easily.  Psychiatric/Behavioral:  Positive for decreased  concentration, dysphoric mood and sleep disturbance. Negative for agitation, confusion and suicidal ideas. The patient is nervous/anxious.     Objective:  BP 126/82   Pulse (!) 51   Temp 98 F (36.7 C)   Ht 5' 7 (1.702 m)   Wt 143 lb 12.8 oz (65.2 kg)   SpO2 97%   BMI 22.52 kg/m   BP Readings from Last 3 Encounters:  09/01/24 126/82  06/23/24 (!) 142/80  03/12/24 (!) 150/100    Wt Readings from Last 3 Encounters:  09/01/24 143 lb 12.8 oz (65.2 kg)  06/23/24 146 lb (66.2 kg)  12/24/23 140 lb (63.5 kg)    Physical Exam Constitutional:      General: She is not in acute distress.    Appearance: Normal appearance. She is well-developed.  HENT:     Head: Normocephalic.     Right Ear: External ear normal.     Left Ear: External ear normal.     Nose: Nose normal.  Eyes:     General:        Right eye: No discharge.        Left eye: No discharge.     Conjunctiva/sclera: Conjunctivae normal.     Pupils: Pupils are equal, round, and reactive to light.  Neck:     Thyroid : No thyromegaly.     Vascular: No JVD.     Trachea: No tracheal deviation.  Cardiovascular:     Rate and Rhythm: Normal rate and regular rhythm.     Heart sounds: Normal heart sounds.  Pulmonary:     Effort: No respiratory distress.     Breath sounds: No stridor. No wheezing.  Abdominal:     General: Bowel sounds are normal. There is no distension.     Palpations: Abdomen is soft. There is no mass.     Tenderness: There is no abdominal tenderness. There is no guarding or rebound.  Musculoskeletal:        General: No tenderness.     Cervical back: Normal range of motion and neck supple. No rigidity.     Right lower leg: No edema.     Left lower leg: No edema.  Lymphadenopathy:     Cervical: No cervical adenopathy.  Skin:    Findings: No erythema or rash.  Neurological:     Mental Status: She is oriented to person, place, and time. Mental status is at baseline.     Cranial Nerves: No cranial nerve  deficit.     Motor: No abnormal muscle tone.     Coordination: Coordination abnormal.     Deep Tendon Reflexes: Reflexes normal.  Psychiatric:        Behavior: Behavior normal.        Thought Content: Thought content normal.        Judgment: Judgment  normal.    Ataxic, poor balance Romberg (+/-)   Lab Results  Component Value Date   WBC 9.2 07/07/2023   HGB 12.2 07/07/2023   HCT 37.8 07/07/2023   PLT 385 07/07/2023   GLUCOSE 172 (H) 07/07/2023   CHOL 202 (H) 09/22/2019   TRIG 78.0 09/22/2019   HDL 53.20 09/22/2019   LDLDIRECT 116.2 02/12/2013   LDLCALC 133 (H) 09/22/2019   ALT 22 05/06/2023   AST 18 05/06/2023   NA 128 (L) 07/07/2023   K 3.7 07/07/2023   CL 95 (L) 07/07/2023   CREATININE 0.73 07/07/2023   BUN 20 07/07/2023   CO2 21 (L) 07/07/2023   TSH 0.62 04/18/2023   INR 0.97 05/25/2018   HGBA1C 5.8 09/22/2019    MM 3D SCREENING MAMMOGRAM BILATERAL BREAST Result Date: 04/03/2024 CLINICAL DATA:  Screening. EXAM: DIGITAL SCREENING BILATERAL MAMMOGRAM WITH TOMOSYNTHESIS AND CAD TECHNIQUE: Bilateral screening digital craniocaudal and mediolateral oblique mammograms were obtained. Bilateral screening digital breast tomosynthesis was performed. The images were evaluated with computer-aided detection. COMPARISON:  Previous exam(s). ACR Breast Density Category b: There are scattered areas of fibroglandular density. FINDINGS: There are no findings suspicious for malignancy. IMPRESSION: No mammographic evidence of malignancy. A result letter of this screening mammogram will be mailed directly to the patient. RECOMMENDATION: Screening mammogram in one year. (Code:SM-B-01Y) BI-RADS CATEGORY  1: Negative. Electronically Signed   By: Rosina Gelineau M.D.   On: 04/03/2024 12:39    Assessment & Plan:   Problem List Items Addressed This Visit     Anxiety   New recurrent falls, usually w/walking x 3 months once a week at least. No LOC. I loose my balance... Brain MRI - 2024 and  03/2024 at [facility] Neurology/Pain Try chair yoga or Tai Chi for balance Labs, carotid Doppler ordered Wean down Gabapentin      Depression   On Spravato q 2 weeks      Relevant Orders   Comprehensive metabolic panel with GFR   TSH   T4, free   Vitamin B12   VITAMIN D  25 Hydroxy (Vit-D Deficiency, Fractures)   CBC with Differential/Platelet   Urinalysis   B12 deficiency   Relevant Orders   Vitamin B12   Vitamin D  deficiency   Relevant Orders   VITAMIN D  25 Hydroxy (Vit-D Deficiency, Fractures)   Falls frequently   New recurrent falls, usually w/walking x 3 months once a week at least. No LOC. I loose my balance... Brain MRI - 2024 and 03/2024 at [facility] Neurology/Pain Try chair yoga or Tai Chi for balance Labs, carotid Doppler ordered      Relevant Orders   Comprehensive metabolic panel with GFR   TSH   T4, free   Vitamin B12   VITAMIN D  25 Hydroxy (Vit-D Deficiency, Fractures)   CBC with Differential/Platelet   Urinalysis   US  Carotid Bilateral   Other Visit Diagnoses       Ataxia    -  Primary   Relevant Orders   US  Carotid Bilateral         No orders of the defined types were placed in this encounter.     Follow-up: Return in about 6 weeks (around 10/13/2024) for a follow-up visit.  Marolyn Noel, MD "

## 2024-09-01 NOTE — Assessment & Plan Note (Signed)
 New recurrent falls, usually w/walking x 3 months once a week at least. No LOC. I loose my balance... Brain MRI - 2024 and 03/2024 at [facility] Neurology/Pain Try chair yoga or Tai Chi for balance Labs, carotid Doppler ordered Wean down Gabapentin

## 2024-09-01 NOTE — Assessment & Plan Note (Signed)
 New recurrent falls, usually w/walking x 3 months once a week at least. No LOC. I loose my balance... Brain MRI - 2024 and 03/2024 at [facility] Neurology/Pain Try chair yoga or Tai Chi for balance Labs, carotid Doppler ordered

## 2024-09-03 ENCOUNTER — Ambulatory Visit: Payer: Self-pay | Admitting: Internal Medicine

## 2024-09-10 ENCOUNTER — Inpatient Hospital Stay: Admission: RE | Admit: 2024-09-10 | Source: Ambulatory Visit

## 2024-09-13 ENCOUNTER — Other Ambulatory Visit: Payer: Self-pay | Admitting: Internal Medicine

## 2025-03-16 ENCOUNTER — Ambulatory Visit
